# Patient Record
Sex: Female | Born: 1970 | Race: Black or African American | Hispanic: No | Marital: Married | Smoking: Never smoker
Health system: Southern US, Community
[De-identification: ages and names within clinical notes are randomized; demographics above are authoritative.]

## PROBLEM LIST (undated history)

## (undated) DIAGNOSIS — Z9289 Personal history of other medical treatment: Secondary | ICD-10-CM

## (undated) DIAGNOSIS — E669 Obesity, unspecified: Secondary | ICD-10-CM

## (undated) DIAGNOSIS — Z973 Presence of spectacles and contact lenses: Secondary | ICD-10-CM

## (undated) DIAGNOSIS — J209 Acute bronchitis, unspecified: Secondary | ICD-10-CM

## (undated) DIAGNOSIS — F419 Anxiety disorder, unspecified: Secondary | ICD-10-CM

## (undated) DIAGNOSIS — G43109 Migraine with aura, not intractable, without status migrainosus: Secondary | ICD-10-CM

## (undated) DIAGNOSIS — R0609 Other forms of dyspnea: Secondary | ICD-10-CM

## (undated) DIAGNOSIS — N946 Dysmenorrhea, unspecified: Secondary | ICD-10-CM

## (undated) DIAGNOSIS — M546 Pain in thoracic spine: Secondary | ICD-10-CM

## (undated) DIAGNOSIS — T7840XA Allergy, unspecified, initial encounter: Secondary | ICD-10-CM

## (undated) DIAGNOSIS — M542 Cervicalgia: Secondary | ICD-10-CM

## (undated) DIAGNOSIS — F411 Generalized anxiety disorder: Secondary | ICD-10-CM

## (undated) DIAGNOSIS — I5189 Other ill-defined heart diseases: Secondary | ICD-10-CM

## (undated) DIAGNOSIS — I2510 Atherosclerotic heart disease of native coronary artery without angina pectoris: Secondary | ICD-10-CM

## (undated) DIAGNOSIS — Z460 Encounter for fitting and adjustment of spectacles and contact lenses: Secondary | ICD-10-CM

## (undated) DIAGNOSIS — S83241A Other tear of medial meniscus, current injury, right knee, initial encounter: Secondary | ICD-10-CM

## (undated) DIAGNOSIS — J029 Acute pharyngitis, unspecified: Secondary | ICD-10-CM

## (undated) DIAGNOSIS — S83242A Other tear of medial meniscus, current injury, left knee, initial encounter: Secondary | ICD-10-CM

## (undated) DIAGNOSIS — L709 Acne, unspecified: Secondary | ICD-10-CM

## (undated) DIAGNOSIS — I10 Essential (primary) hypertension: Secondary | ICD-10-CM

## (undated) DIAGNOSIS — D500 Iron deficiency anemia secondary to blood loss (chronic): Secondary | ICD-10-CM

## (undated) DIAGNOSIS — T783XXA Angioneurotic edema, initial encounter: Secondary | ICD-10-CM

## (undated) DIAGNOSIS — Z9889 Other specified postprocedural states: Secondary | ICD-10-CM

## (undated) DIAGNOSIS — M948X9 Other specified disorders of cartilage, unspecified sites: Secondary | ICD-10-CM

## (undated) DIAGNOSIS — K219 Gastro-esophageal reflux disease without esophagitis: Secondary | ICD-10-CM

## (undated) HISTORY — DX: Other specified postprocedural states: Z98.890

## (undated) HISTORY — DX: Obesity, unspecified: E66.9

## (undated) HISTORY — DX: Acute pharyngitis, unspecified: J02.9

## (undated) HISTORY — DX: Other forms of dyspnea: R06.09

## (undated) HISTORY — DX: Personal history of other medical treatment: Z92.89

## (undated) HISTORY — DX: Migraine with aura, not intractable, without status migrainosus: G43.109

## (undated) HISTORY — DX: Essential (primary) hypertension: I10

## (undated) HISTORY — DX: Pain in thoracic spine: M54.6

## (undated) HISTORY — DX: Acne, unspecified: L70.9

## (undated) HISTORY — DX: Acute bronchitis, unspecified: J20.9

## (undated) HISTORY — DX: Other ill-defined heart diseases: I51.89

## (undated) HISTORY — DX: Generalized anxiety disorder: F41.1

## (undated) HISTORY — DX: Anxiety disorder, unspecified: F41.9

## (undated) HISTORY — DX: Other tear of medial meniscus, current injury, left knee, initial encounter: S83.242A

## (undated) HISTORY — DX: Angioneurotic edema, initial encounter: T78.3XXA

## (undated) HISTORY — DX: Dysmenorrhea, unspecified: N94.6

## (undated) HISTORY — DX: Gastro-esophageal reflux disease without esophagitis: K21.9

## (undated) HISTORY — DX: Other specified disorders of cartilage, unspecified sites: M94.8X9

## (undated) HISTORY — DX: Other tear of medial meniscus, current injury, right knee, initial encounter: S83.241A

## (undated) HISTORY — DX: Cervicalgia: M54.2

## (undated) HISTORY — DX: Atherosclerotic heart disease of native coronary artery without angina pectoris: I25.10

## (undated) HISTORY — DX: Allergy, unspecified, initial encounter: T78.40XA

## (undated) HISTORY — DX: Iron deficiency anemia secondary to blood loss (chronic): D50.0

## (undated) HISTORY — PX: DILATION AND CURETTAGE OF UTERUS: SHX78

## (undated) SURGERY — IMAGING PROCEDURE, GI TRACT, INTRALUMINAL, VIA CAPSULE
Anesthesia: Choice

---

## 2005-01-04 ENCOUNTER — Emergency Department: Payer: Self-pay | Admitting: Unknown Physician Specialty

## 2005-11-11 ENCOUNTER — Emergency Department: Payer: Self-pay | Admitting: Emergency Medicine

## 2006-05-13 ENCOUNTER — Emergency Department: Payer: Self-pay | Admitting: Emergency Medicine

## 2006-05-13 ENCOUNTER — Other Ambulatory Visit: Payer: Self-pay

## 2007-02-11 ENCOUNTER — Ambulatory Visit: Payer: Self-pay | Admitting: Family Medicine

## 2007-10-06 ENCOUNTER — Ambulatory Visit: Payer: Self-pay | Admitting: Family Medicine

## 2007-10-07 ENCOUNTER — Ambulatory Visit: Payer: Self-pay | Admitting: Family Medicine

## 2007-10-07 ENCOUNTER — Emergency Department: Payer: Self-pay | Admitting: Unknown Physician Specialty

## 2007-10-07 ENCOUNTER — Other Ambulatory Visit: Payer: Self-pay

## 2007-12-29 ENCOUNTER — Ambulatory Visit: Payer: Self-pay

## 2007-12-30 ENCOUNTER — Ambulatory Visit: Payer: Self-pay

## 2008-01-16 ENCOUNTER — Other Ambulatory Visit: Payer: Self-pay

## 2008-01-16 ENCOUNTER — Emergency Department: Payer: Self-pay | Admitting: Emergency Medicine

## 2008-10-15 ENCOUNTER — Ambulatory Visit: Payer: Self-pay

## 2008-10-21 ENCOUNTER — Inpatient Hospital Stay: Payer: Self-pay

## 2009-09-20 ENCOUNTER — Ambulatory Visit: Payer: Self-pay | Admitting: Family Medicine

## 2009-10-29 ENCOUNTER — Ambulatory Visit: Payer: Self-pay | Admitting: Family Medicine

## 2010-04-25 HISTORY — PX: ABDOMINAL HYSTERECTOMY: SHX81

## 2010-04-25 HISTORY — PX: OVARIAN CYST REMOVAL: SHX89

## 2010-05-25 ENCOUNTER — Ambulatory Visit: Payer: Self-pay

## 2010-08-06 ENCOUNTER — Ambulatory Visit: Payer: Self-pay | Admitting: Internal Medicine

## 2010-12-18 ENCOUNTER — Ambulatory Visit: Payer: Self-pay | Admitting: Internal Medicine

## 2011-03-26 ENCOUNTER — Ambulatory Visit: Payer: Self-pay

## 2011-03-30 LAB — CBC AND DIFFERENTIAL: WBC: 6.6 10^3/mL

## 2011-05-20 ENCOUNTER — Ambulatory Visit: Payer: Self-pay | Admitting: Family Medicine

## 2011-08-30 ENCOUNTER — Encounter: Payer: Self-pay | Admitting: Internal Medicine

## 2011-08-30 ENCOUNTER — Ambulatory Visit (INDEPENDENT_AMBULATORY_CARE_PROVIDER_SITE_OTHER): Payer: PRIVATE HEALTH INSURANCE | Admitting: Internal Medicine

## 2011-08-30 VITALS — BP 136/90 | HR 95 | Temp 98.1°F | Ht 62.0 in | Wt 216.0 lb

## 2011-08-30 DIAGNOSIS — G4700 Insomnia, unspecified: Secondary | ICD-10-CM

## 2011-08-30 DIAGNOSIS — E669 Obesity, unspecified: Secondary | ICD-10-CM

## 2011-08-30 MED ORDER — PHENTERMINE HCL 37.5 MG PO CAPS: 37.5000 mg | ORAL_CAPSULE | ORAL | Status: DC

## 2011-08-30 MED ORDER — ALPRAZOLAM 0.5 MG PO TABS: 0.5000 mg | ORAL_TABLET | Freq: Two times a day (BID) | ORAL | Status: DC | PRN

## 2011-08-30 NOTE — Assessment & Plan Note (Signed)
BMI 30. Patient has significantly reduced caloric intake and has adopted an exercise program with no improvement in her weight. Will try adding phentermine to help suppress appetite. Patient has a history of hypertension, and we discussed the need for close monitoring of her blood pressure with use of this medication. 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 She will call if blood pressure greater than 140/90. She will also call if any other symptoms such as palpitations or headache. Goal will be for one to 2 pounds of weight loss per week. She will followup in one month.

## 2011-08-30 NOTE — Progress Notes (Signed)
Subjective:    Patient ID: Marie Vaughan, female    DOB: 1970-09-28, 41 y.o.   MRN: [id number]  HPI 41 year old female with history of obesity and insomnia presents to establish care. In general, she reports that she's been feeling well. She notes that over the last several months she has been trying to lose weight. She notes that she is adopted a healthy diet, with limited processed carbohydrates and fat. She notes that she has increased her water intake. She has also been exercising 30 minutes to one hour daily by walking. She has not seen any improvement in her weight. She notes that she has been evaluated for this in the past and had testing including testing for hypothyroidism. She reports that workup has been negative.  She also notes a history of anxiety and insomnia. She reports that she has been taking Xanax occasionally at bedtime to help with anxiety and sleep. She reports significant improvement in her symptoms on this medication. She denies any morning drowsiness or other side effects.  Outpatient Encounter Prescriptions as of 08/30/2011  Medication Sig Dispense Refill  . ALPRAZolam (XANAX) 0.5 MG tablet Take 1 tablet (0.5 mg total) by mouth 2 (two) times daily as needed.  60 tablet  3  . esomeprazole (NEXIUM) 40 MG capsule Take 40 mg by mouth daily.      Marland Kitchen HYDROcodone-acetaminophen (VICODIN) 5-500 MG per tablet Take 1 tablet by mouth every 4 (four) hours as needed.      . phentermine 37.5 MG capsule Take 1 capsule (37.5 mg total) by mouth every morning.  30 capsule  0  . DISCONTD: ALPRAZolam (XANAX) 0.5 MG tablet Take 0.5 mg by mouth 2 (two) times daily as needed.        Review of Systems  Constitutional: Negative for fever, chills, appetite change, fatigue and unexpected weight change.  HENT: Negative for ear pain, congestion, sore throat, trouble swallowing, neck pain, voice change and sinus pressure.   Eyes: Negative for visual disturbance.  Respiratory: Negative for cough,  shortness of breath, wheezing and stridor.   Cardiovascular: Negative for chest pain, palpitations and leg swelling.  Gastrointestinal: Negative for nausea, vomiting, abdominal pain, diarrhea, constipation, blood in stool, abdominal distention and anal bleeding.  Genitourinary: Negative for dysuria and flank pain.  Musculoskeletal: Negative for myalgias, arthralgias and gait problem.  Skin: Negative for color change and rash.  Neurological: Negative for dizziness and headaches.  Hematological: Negative for adenopathy. Does not bruise/bleed easily.  Psychiatric/Behavioral: Positive for sleep disturbance. Negative for suicidal ideas and dysphoric mood. The patient is not nervous/anxious.    BP 136/90  Pulse 95  Temp(Src) 98.1 F (36.7 C) (Oral)  Ht 5\' 2"  (1.575 m)  Wt 216 lb (97.977 kg)  BMI 39.51 kg/m2  SpO2 99%     Objective:   Physical Exam  Constitutional: She is oriented to person, place, and time. She appears well-developed and well-nourished. No distress.  HENT:  Head: Normocephalic and atraumatic.  Right Ear: External ear normal.  Left Ear: External ear normal.  Nose: Nose normal.  Mouth/Throat: Oropharynx is clear and moist. No oropharyngeal exudate.  Eyes: Conjunctivae are normal. Pupils are equal, round, and reactive to light. Right eye exhibits no discharge. Left eye exhibits no discharge. No scleral icterus.  Neck: Normal range of motion. Neck supple. No tracheal deviation present. No thyromegaly present.  Cardiovascular: Normal rate, regular rhythm, normal heart sounds and intact distal pulses.  Exam reveals no gallop and no friction rub.  No murmur heard. Pulmonary/Chest: Effort normal and breath sounds normal. No respiratory distress. She has no wheezes. She has no rales. She exhibits no tenderness.  Musculoskeletal: Normal range of motion. She exhibits no edema and no tenderness.  Lymphadenopathy:    She has no cervical adenopathy.  Neurological: She is alert and  oriented to person, place, and time. No cranial nerve deficit. She exhibits normal muscle tone. Coordination normal.  Skin: Skin is warm and dry. No rash noted. She is not diaphoretic. No erythema. No pallor.  Psychiatric: She has a normal mood and affect. Her behavior is normal. Judgment and thought content normal.          Assessment & Plan:

## 2011-08-30 NOTE — Assessment & Plan Note (Signed)
Improved with the use of Xanax. Will refill medication today.

## 2011-09-14 ENCOUNTER — Encounter: Payer: Self-pay | Admitting: Internal Medicine

## 2011-09-28 ENCOUNTER — Telehealth: Payer: Self-pay | Admitting: Internal Medicine

## 2011-09-28 ENCOUNTER — Ambulatory Visit: Payer: PRIVATE HEALTH INSURANCE | Admitting: Internal Medicine

## 2011-09-28 NOTE — Telephone Encounter (Signed)
[phone] Pt r/s appointment from today to next Friday 3/15  She only has enough pills to last till wed 3/13 Phentermine 37.5 mg tablet [facility]

## 2011-09-28 NOTE — Telephone Encounter (Signed)
Informed pt that she would need OV prior to next fill to ensure that BP was not elevated. Pt understands and will keep OV next week.

## 2011-10-05 ENCOUNTER — Encounter: Payer: Self-pay | Admitting: Internal Medicine

## 2011-10-05 ENCOUNTER — Ambulatory Visit (INDEPENDENT_AMBULATORY_CARE_PROVIDER_SITE_OTHER): Payer: PRIVATE HEALTH INSURANCE | Admitting: Internal Medicine

## 2011-10-05 ENCOUNTER — Telehealth: Payer: Self-pay | Admitting: *Deleted

## 2011-10-05 VITALS — BP 128/80 | HR 97 | Temp 98.3°F | Ht 62.0 in | Wt 200.0 lb

## 2011-10-05 DIAGNOSIS — E669 Obesity, unspecified: Secondary | ICD-10-CM

## 2011-10-05 DIAGNOSIS — J309 Allergic rhinitis, unspecified: Secondary | ICD-10-CM | POA: Insufficient documentation

## 2011-10-05 MED ORDER — PHENTERMINE HCL 37.5 MG PO CAPS: 37.5000 mg | ORAL_CAPSULE | ORAL | Status: DC

## 2011-10-05 MED ORDER — FLUTICASONE PROPIONATE 50 MCG/ACT NA SUSP: 2.0000 | Freq: Every day | NASAL | Status: DC

## 2011-10-05 NOTE — Progress Notes (Signed)
Subjective:    Patient ID: Marie Vaughan, female    DOB: 06-25-1971, 40 y.o.   MRN: [id number]  HPI 41 year old female with history of obesity presents for followup. She reports that she's been doing well. She has been using phentermine to help with appetite suppression and reports significant improvement with this. She has been limiting her intake of sugar and has been exercising regularly. She denies any noted side effects from phentermine.  She notes some recent increase clear nasal drainage. She denies any fever, chills, cough, sore throat, or other symptoms. She questions whether this may be related to allergies. She has not tried any over-the-counter medications for this.  Outpatient Encounter Prescriptions as of 10/05/2011  Medication Sig Dispense Refill  . ALPRAZolam (XANAX) 0.5 MG tablet Take 1 tablet (0.5 mg total) by mouth 2 (two) times daily as needed.  60 tablet  3  . esomeprazole (NEXIUM) 40 MG capsule Take 40 mg by mouth daily.      Marland Kitchen HYDROcodone-acetaminophen (VICODIN) 5-500 MG per tablet Take 1 tablet by mouth every 4 (four) hours as needed.      . fluticasone (FLONASE) 50 MCG/ACT nasal spray Place 2 sprays into the nose daily.  1 g  0  . phentermine 37.5 MG capsule Take 1 capsule (37.5 mg total) by mouth every morning.  30 capsule  0    Review of Systems  Constitutional: Negative for fever, chills, appetite change, fatigue and unexpected weight change.  HENT: Positive for rhinorrhea, sneezing and postnasal drip. Negative for ear pain, congestion, sore throat, trouble swallowing, neck pain, voice change and sinus pressure.   Eyes: Negative for visual disturbance.  Respiratory: Negative for cough, shortness of breath, wheezing and stridor.   Cardiovascular: Negative for chest pain, palpitations and leg swelling.  Gastrointestinal: Negative for nausea, vomiting, abdominal pain, diarrhea, constipation, blood in stool, abdominal distention and anal bleeding.  Genitourinary:  Negative for dysuria and flank pain.  Musculoskeletal: Negative for myalgias, arthralgias and gait problem.  Skin: Negative for color change and rash.  Neurological: Negative for dizziness and headaches.  Hematological: Negative for adenopathy. Does not bruise/bleed easily.  Psychiatric/Behavioral: Negative for suicidal ideas, sleep disturbance and dysphoric mood. The patient is not nervous/anxious.    BP 128/80  Pulse 97  Temp(Src) 98.3 F (36.8 C) (Oral)  Ht 5\' 2"  (1.575 m)  Wt 200 lb (90.719 kg)  BMI 36.58 kg/m2  SpO2 100%     Objective:   Physical Exam  Constitutional: She is oriented to person, place, and time. She appears well-developed and well-nourished. No distress.  HENT:  Head: Normocephalic and atraumatic.  Right Ear: External ear normal.  Left Ear: External ear normal.  Nose: Mucosal edema present.  Mouth/Throat: Oropharynx is clear and moist. No oropharyngeal exudate.  Eyes: Conjunctivae are normal. Pupils are equal, round, and reactive to light. Right eye exhibits no discharge. Left eye exhibits no discharge. No scleral icterus.  Neck: Normal range of motion. Neck supple. No tracheal deviation present. No thyromegaly present.  Cardiovascular: Normal rate, regular rhythm, normal heart sounds and intact distal pulses.  Exam reveals no gallop and no friction rub.   No murmur heard. Pulmonary/Chest: Effort normal and breath sounds normal. No respiratory distress. She has no wheezes. She has no rales. She exhibits no tenderness.  Musculoskeletal: Normal range of motion. She exhibits no edema and no tenderness.  Lymphadenopathy:    She has no cervical adenopathy.  Neurological: She is alert and oriented to person, place, and time.  No cranial nerve deficit. She exhibits normal muscle tone. Coordination normal.  Skin: Skin is warm and dry. No rash noted. She is not diaphoretic. No erythema. No pallor.  Psychiatric: She has a normal mood and affect. Her behavior is normal.  Judgment and thought content normal.          Assessment & Plan:

## 2011-10-05 NOTE — Telephone Encounter (Signed)
Tried calling patient on home/cell number and was unable to reach her, no way to leave a message.

## 2011-10-05 NOTE — Assessment & Plan Note (Signed)
Educated patient on over 10 pound weight loss since her last visit. Encouraged her to continue efforts at diet and exercise. Will continue phentermine for appetite suppression. Followup one month.

## 2011-10-05 NOTE — Assessment & Plan Note (Signed)
Symptoms consistent with allergic rhinitis. Will add nasal steroid and encouraged her to use Claritin or Zyrtec over-the-counter to help her symptoms. She will call if no improvement.

## 2011-10-05 NOTE — Telephone Encounter (Signed)
My Chart message sent

## 2011-10-05 NOTE — Telephone Encounter (Signed)
What is the hydrocodone for? She did not mention this at her visit?

## 2011-10-05 NOTE — Telephone Encounter (Signed)
Patient requesting RF of hydrocodone 5/500 1 q 4 to 6 prn #30

## 2011-10-25 ENCOUNTER — Encounter: Payer: Self-pay | Admitting: Internal Medicine

## 2011-10-25 DIAGNOSIS — K219 Gastro-esophageal reflux disease without esophagitis: Secondary | ICD-10-CM | POA: Insufficient documentation

## 2011-10-25 DIAGNOSIS — G43109 Migraine with aura, not intractable, without status migrainosus: Secondary | ICD-10-CM | POA: Insufficient documentation

## 2011-10-25 DIAGNOSIS — F419 Anxiety disorder, unspecified: Secondary | ICD-10-CM | POA: Insufficient documentation

## 2011-10-25 DIAGNOSIS — T783XXA Angioneurotic edema, initial encounter: Secondary | ICD-10-CM | POA: Insufficient documentation

## 2011-10-25 DIAGNOSIS — L709 Acne, unspecified: Secondary | ICD-10-CM | POA: Insufficient documentation

## 2011-11-15 ENCOUNTER — Ambulatory Visit: Payer: PRIVATE HEALTH INSURANCE | Admitting: Internal Medicine

## 2011-11-15 DIAGNOSIS — Z0289 Encounter for other administrative examinations: Secondary | ICD-10-CM

## 2012-01-23 ENCOUNTER — Ambulatory Visit: Payer: Self-pay | Admitting: Family Medicine

## 2012-04-18 ENCOUNTER — Ambulatory Visit: Payer: Self-pay | Admitting: Orthopedic Surgery

## 2012-08-10 ENCOUNTER — Emergency Department: Payer: Self-pay | Admitting: Emergency Medicine

## 2012-08-10 LAB — BASIC METABOLIC PANEL
Anion Gap: 11 (ref 7–16)
BUN: 9 mg/dL (ref 7–18)
Chloride: 110 mmol/L — ABNORMAL HIGH (ref 98–107)
EGFR (African American): >60
EGFR (Non-African Amer.): >60
Glucose: 155 mg/dL — ABNORMAL HIGH (ref 65–99)
Osmolality: 283 (ref 275–301)
Potassium: 3.8 mmol/L (ref 3.5–5.1)
Sodium: 141 mmol/L (ref 136–145)

## 2012-08-10 LAB — CBC
HGB: 12.3 g/dL (ref 12.0–16.0)
MCHC: 32.8 g/dL (ref 32.0–36.0)
RBC: 4.49 10*6/uL (ref 3.80–5.20)
RDW: 14.3 % (ref 11.5–14.5)
WBC: 7.3 10*3/uL (ref 3.6–11.0)

## 2012-08-10 LAB — PROTIME-INR: Prothrombin Time: 13.3 secs (ref 11.5–14.7)

## 2012-09-06 ENCOUNTER — Other Ambulatory Visit: Payer: Self-pay

## 2012-12-17 ENCOUNTER — Ambulatory Visit: Payer: Self-pay | Admitting: Family Medicine

## 2013-01-05 ENCOUNTER — Encounter: Payer: Self-pay | Admitting: Internal Medicine

## 2013-01-05 ENCOUNTER — Telehealth: Payer: Self-pay | Admitting: Internal Medicine

## 2013-01-05 ENCOUNTER — Ambulatory Visit (INDEPENDENT_AMBULATORY_CARE_PROVIDER_SITE_OTHER): Payer: 59 | Admitting: Internal Medicine

## 2013-01-05 VITALS — BP 124/80 | HR 88 | Temp 98.7°F | Ht 62.0 in | Wt 218.0 lb

## 2013-01-05 DIAGNOSIS — R05 Cough: Secondary | ICD-10-CM | POA: Insufficient documentation

## 2013-01-05 DIAGNOSIS — R059 Cough, unspecified: Secondary | ICD-10-CM | POA: Insufficient documentation

## 2013-01-05 MED ORDER — FAMOTIDINE 20 MG PO TABS: ORAL_TABLET | ORAL | Status: DC

## 2013-01-05 MED ORDER — TRAMADOL HCL 50 MG PO TABS: ORAL_TABLET | ORAL | Status: DC

## 2013-01-05 MED ORDER — ESOMEPRAZOLE MAGNESIUM 40 MG PO CPDR: 40.0000 mg | DELAYED_RELEASE_CAPSULE | Freq: Every day | ORAL | Status: DC

## 2013-01-05 MED ORDER — PREDNISONE (PAK) 10 MG PO TABS: ORAL_TABLET | ORAL | Status: DC

## 2013-01-05 NOTE — Patient Instructions (Addendum)
The key to effective treatment for your cough is eliminating the non-stop cycle of cough you're stuck in long enough to let your airway heal completely and then see if there is anything still making you cough once you stop the cough suppression, but this should take no more than 5 days to figure out  First take delsym two tsp every 12 hours and supplement if needed with  tramadol 50 mg up to 2 every 4 hours to suppress the urge to cough at all or even clear your throat. Swallowing water or using ice chips/non mint and menthol containing candies (such as lifesavers or sugarless jolly ranchers) are also effective.  You should rest your voice and avoid activities that you know make you cough.  Once you have eliminated the cough for 3 straight days try reducing the tramadol first,  then the delsym as tolerated.    Try prilosec 20mg   Take 30-60 min before first meal of the day and Pepcid 20 mg one bedtime until cough is completely gone for at least a week without the need for cough suppression  I think of reflux for chronic cough like I do oxygen for fire (doesn't cause the fire but once you get the oxygen suppressed it usually goes away regardless of the exact cause).  GERD (REFLUX)  is an extremely common cause of respiratory symptoms, many times with no significant heartburn at all.    It can be treated with medication, but also with lifestyle changes including avoidance of late meals, excessive alcohol, smoking cessation, and avoid fatty foods, chocolate, peppermint, colas, red wine, and acidic juices such as orange juice.  NO MINT OR MENTHOL PRODUCTS SO NO COUGH DROPS  USE SUGARLESS CANDY INSTEAD (jolley ranchers or Stover's)  NO OIL BASED VITAMINS - use powdered substitutes.    Prednisone 10 mg take  4 each am x 2 days,   2 each am x 2 days,  1 each am x 2 days and stop    Chlortrimeton 4 mg one at bedtime  If not 100% better you need to return in 2 weeks

## 2013-01-05 NOTE — Progress Notes (Signed)
  Subjective:    Patient ID: Marie Vaughan, female    DOB: 1970-11-12 MRN: [id number]  HPI  71 yobf never smoker referred to [facility] around 2011 for hives rec avoidance (cock roach, peanuts, pollen) and benadryl >  Better, and then acute uri symptoms early May 2014 rx with zpak then septra but cough persisted so referred 01/05/2013 to pulmonary clinic in [place] by Dr Dennison Mascot.  01/05/2013 1st pulmonary eval cc mostly dry cough daily since abupt onset with ?cold in early May to point of choking and vomiting abrupt onset with uri x 3 weeks assoc with central cp ant worse with coughing and mainly sob with cough. Also urinary incont with cough.   Previously on prn nexium but not taking it regularly as doesn't perceive this is bad HB  No obvious daytime variabilty or assoc chronic cough or cp or chest tightness, subjective wheeze overt sinus or hb symptoms. No unusual exp hx or h/o childhood pna/ asthma or knowledge of premature birth.   Sleeping ok p cough suppression  without nocturnal  or early am exacerbation  of respiratory  c/o's or need for noct saba. Also denies any obvious fluctuation of symptoms with weather or environmental changes or other aggravating or alleviating factors except as outlined above     Review of Systems  Constitutional: Negative for fever, chills and unexpected weight change.  HENT: Positive for ear pain. Negative for nosebleeds, congestion, sore throat, rhinorrhea, sneezing, trouble swallowing, dental problem, voice change, postnasal drip and sinus pressure.   Eyes: Negative for visual disturbance.  Respiratory: Positive for cough, choking and shortness of breath.   Cardiovascular: Positive for chest pain. Negative for leg swelling.  Gastrointestinal: Positive for abdominal pain. Negative for vomiting, diarrhea and abdominal distention.  Genitourinary: Negative for difficulty urinating.  Musculoskeletal: Negative for arthralgias.  Skin: Negative for  rash.  Neurological: Positive for headaches. Negative for tremors and syncope.  Hematological: Does not bruise/bleed easily.       Objective:   Physical Exam amb hoarse bf nad  Wt Readings from Last 3 Encounters:  01/05/13 218 lb (98.884 kg)  03/30/11 216 lb 9.6 oz (98.249 kg)  10/05/11 200 lb (90.719 kg)     HEENT: nl dentition, turbinates, and orophanx. Nl external ear canals without cough reflex   NECK :  without JVD/Nodes/TM/ nl carotid upstrokes bilaterally   LUNGS: no acc muscle use, clear to A and P bilaterally without cough on insp or exp maneuvers   CV:  RRR  no s3 or murmur or increase in P2, no edema   ABD:  soft and nontender with nl excursion in the supine position. No bruits or organomegaly, bowel sounds nl  MS:  warm without deformities, calf tenderness, cyanosis or clubbing  SKIN: warm and dry without lesions    NEURO:  alert, approp, no deficits     cxr at [place] one week prior to ov      Assessment & Plan:

## 2013-01-05 NOTE — Assessment & Plan Note (Addendum)
The most common causes of chronic cough in immunocompetent adults include the following: upper airway cough syndrome (UACS), previously referred to as postnasal drip syndrome (PNDS), which is caused by variety of rhinosinus conditions; (2) asthma; (3) GERD; (4) chronic bronchitis from cigarette smoking or other inhaled environmental irritants; (5) nonasthmatic eosinophilic bronchitis; and (6) bronchiectasis.   These conditions, singly or in combination, have accounted for up to 94% of the causes of chronic cough in prospective studies.   Other conditions have constituted no >6% of the causes in prospective studies These have included bronchogenic carcinoma, chronic interstitial pneumonia, sarcoidosis, left ventricular failure, ACEI-induced cough, and aspiration from a condition associated with pharyngeal dysfunction.    Chronic cough is often simultaneously caused by more than one condition. A single cause has been found from 38 to 82% of the time, multiple causes from 18 to 62%. Multiply caused cough has been the result of three diseases up to 42% of the time.       Most likely this is  Classic Upper airway cough syndrome, so named because it's frequently impossible to sort out how much is  CR/sinusitis with freq throat clearing (which can be related to primary GERD)   vs  causing  secondary (" extra esophageal")  GERD from wide swings in gastric pressure that occur with throat clearing, often  promoting self use of mint and menthol lozenges that reduce the lower esophageal sphincter tone and exacerbate the problem further in a cyclical fashion.   These are the same pts (now being labeled as having "irritable larynx syndrome" by some cough centers) who not infrequently have a history of having failed to tolerate ace inhibitors,  dry powder inhalers or biphosphonates or report having atypical reflux symptoms that don't respond to standard doses of PPI , and are easily confused as having aecopd or asthma  flares by even experienced allergists/ pulmonologists.   rec max gerd rx and cyclical cough regimen plus H1 for ? pnds > See instructions for specific recommendations which were reviewed directly with the patient who was given a copy with highlighter outlining the key components.   NB The standardized cough guidelines published in Chest by [place] in 2006 are still the best available and consist of a multiple step process (up to 12!) , not a single office visit,  and are intended  to address this problem logically,  with an alogrithm dependent on response to empiric treatment at  each progressive step  to determine a specific diagnosis with  minimal addtional testing needed. Therefore if adherence is an issue or can't be accurately verified,  it's very unlikely the standard evaluation and treatment will be successful here.    Furthermore, response to therapy (other than acute cough suppression, which should only be used short term with avoidance of narcotic containing cough syrups if possible), can be a gradual process for which the patient may perceive immediate benefit.  Unlike going to an eye doctor where the best perscription is almost always the first one and is immediately effective, this is almost never the case in the management of chronic cough syndromes. Therefore the patient needs to commit up front to consistently adhere to recommendations  for up to 6 weeks of therapy directed at the likely underlying problem(s) before the response can be reasonably evaluated.

## 2013-01-06 ENCOUNTER — Encounter: Payer: Self-pay | Admitting: Internal Medicine

## 2013-01-06 ENCOUNTER — Telehealth: Payer: Self-pay | Admitting: Internal Medicine

## 2013-01-06 NOTE — Telephone Encounter (Signed)
Spoke with pt notified that I have faxed her work excuse Nothing further needed per pt

## 2013-01-06 NOTE — Telephone Encounter (Signed)
Needs work note stating patient out of work:  6/17-6/18 Return date: 6/19  Ok to send work note?  Please advise Dr Sherene Sires. Thanks.

## 2013-01-06 NOTE — Telephone Encounter (Signed)
Ok to excuse from work as outlined

## 2013-01-06 NOTE — Telephone Encounter (Signed)
I spoke to pt around 10am this morning and made her aware that I would check with Dr Sherene Sires and then letter would be sent. Message has been sent to MW to address.  Duplicate message--see msg 01/05/13

## 2013-01-13 ENCOUNTER — Institutional Professional Consult (permissible substitution): Payer: Self-pay | Admitting: Pulmonary Disease

## 2013-04-02 ENCOUNTER — Ambulatory Visit: Payer: Self-pay

## 2013-04-10 ENCOUNTER — Ambulatory Visit: Payer: Self-pay | Admitting: Family Medicine

## 2013-05-19 ENCOUNTER — Ambulatory Visit: Payer: Self-pay | Admitting: Orthopedic Surgery

## 2013-05-28 ENCOUNTER — Other Ambulatory Visit: Payer: Self-pay

## 2013-06-08 ENCOUNTER — Encounter (HOSPITAL_BASED_OUTPATIENT_CLINIC_OR_DEPARTMENT_OTHER): Payer: Self-pay | Admitting: *Deleted

## 2013-06-08 NOTE — Progress Notes (Signed)
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

## 2013-06-10 ENCOUNTER — Encounter (HOSPITAL_BASED_OUTPATIENT_CLINIC_OR_DEPARTMENT_OTHER)
Admission: RE | Disposition: A | Discharge status: Home / Self Care | Payer: Self-pay | Source: Ambulatory Visit | Attending: Orthopedic Surgery

## 2013-06-10 ENCOUNTER — Other Ambulatory Visit: Payer: Self-pay | Admitting: Physician Assistant

## 2013-06-10 ENCOUNTER — Ambulatory Visit (HOSPITAL_BASED_OUTPATIENT_CLINIC_OR_DEPARTMENT_OTHER)
Admission: RE | Admit: 2013-06-10 | Discharge: 2013-06-10 | Disposition: A | Discharge status: Home / Self Care | Payer: 59 | Source: Ambulatory Visit | Attending: Orthopedic Surgery | Admitting: Orthopedic Surgery

## 2013-06-10 ENCOUNTER — Ambulatory Visit (HOSPITAL_BASED_OUTPATIENT_CLINIC_OR_DEPARTMENT_OTHER): Payer: 59 | Admitting: *Deleted

## 2013-06-10 ENCOUNTER — Encounter (HOSPITAL_BASED_OUTPATIENT_CLINIC_OR_DEPARTMENT_OTHER): Payer: 59 | Admitting: *Deleted

## 2013-06-10 ENCOUNTER — Encounter (HOSPITAL_BASED_OUTPATIENT_CLINIC_OR_DEPARTMENT_OTHER): Payer: Self-pay | Admitting: *Deleted

## 2013-06-10 ENCOUNTER — Encounter: Payer: Self-pay | Admitting: Physician Assistant

## 2013-06-10 DIAGNOSIS — M659 Unspecified synovitis and tenosynovitis, unspecified site: Secondary | ICD-10-CM | POA: Insufficient documentation

## 2013-06-10 DIAGNOSIS — S83242A Other tear of medial meniscus, current injury, left knee, initial encounter: Secondary | ICD-10-CM | POA: Diagnosis present

## 2013-06-10 DIAGNOSIS — S83289A Other tear of lateral meniscus, current injury, unspecified knee, initial encounter: Secondary | ICD-10-CM | POA: Insufficient documentation

## 2013-06-10 DIAGNOSIS — M2240 Chondromalacia patellae, unspecified knee: Secondary | ICD-10-CM | POA: Insufficient documentation

## 2013-06-10 DIAGNOSIS — I10 Essential (primary) hypertension: Secondary | ICD-10-CM | POA: Insufficient documentation

## 2013-06-10 DIAGNOSIS — W19XXXA Unspecified fall, initial encounter: Secondary | ICD-10-CM | POA: Insufficient documentation

## 2013-06-10 DIAGNOSIS — G4700 Insomnia, unspecified: Secondary | ICD-10-CM | POA: Diagnosis present

## 2013-06-10 DIAGNOSIS — F419 Anxiety disorder, unspecified: Secondary | ICD-10-CM | POA: Diagnosis present

## 2013-06-10 DIAGNOSIS — IMO0002 Reserved for concepts with insufficient information to code with codable children: Secondary | ICD-10-CM | POA: Insufficient documentation

## 2013-06-10 DIAGNOSIS — S83241A Other tear of medial meniscus, current injury, right knee, initial encounter: Secondary | ICD-10-CM | POA: Diagnosis present

## 2013-06-10 DIAGNOSIS — E669 Obesity, unspecified: Secondary | ICD-10-CM | POA: Diagnosis present

## 2013-06-10 DIAGNOSIS — K219 Gastro-esophageal reflux disease without esophagitis: Secondary | ICD-10-CM | POA: Diagnosis present

## 2013-06-10 HISTORY — PX: KNEE ARTHROSCOPY WITH MEDIAL MENISECTOMY: SHX5651

## 2013-06-10 HISTORY — DX: Encounter for fitting and adjustment of spectacles and contact lenses: Z46.0

## 2013-06-10 HISTORY — PX: KNEE ARTHROSCOPY: SHX127

## 2013-06-10 LAB — POCT HEMOGLOBIN-HEMACUE: Hemoglobin: 12.6 g/dL (ref 12.0–15.0)

## 2013-06-10 SURGERY — ARTHROSCOPY, KNEE, WITH MEDIAL MENISCECTOMY
Anesthesia: General | Site: Knee | Laterality: Right

## 2013-06-10 MED ORDER — BUPIVACAINE-EPINEPHRINE 0.25% -1:200000 IJ SOLN
INTRAMUSCULAR | Status: DC | PRN
  Administered 2013-06-10: 20 mL

## 2013-06-10 MED ORDER — CEFAZOLIN SODIUM 1-5 GM-% IV SOLN
INTRAVENOUS | Status: AC
  Filled 2013-06-10: qty 100

## 2013-06-10 MED ORDER — EPINEPHRINE HCL 1 MG/ML IJ SOLN
INTRAMUSCULAR | Status: DC | PRN
  Administered 2013-06-10: 1 mg

## 2013-06-10 MED ORDER — ONDANSETRON HCL 4 MG/2ML IJ SOLN
INTRAMUSCULAR | Status: DC | PRN
  Administered 2013-06-10: 4 mg via INTRAVENOUS

## 2013-06-10 MED ORDER — PROPOFOL 10 MG/ML IV BOLUS
INTRAVENOUS | Status: DC | PRN
  Administered 2013-06-10: 150 mg via INTRAVENOUS

## 2013-06-10 MED ORDER — LACTATED RINGERS IV SOLN
INTRAVENOUS | Status: DC
  Administered 2013-06-10 (×2): via INTRAVENOUS

## 2013-06-10 MED ORDER — FENTANYL CITRATE 0.05 MG/ML IJ SOLN
INTRAMUSCULAR | Status: DC | PRN
  Administered 2013-06-10 (×2): 50 ug via INTRAVENOUS

## 2013-06-10 MED ORDER — HYDROMORPHONE HCL PF 1 MG/ML IJ SOLN: 0.2500 mg | INTRAMUSCULAR | Status: DC | PRN

## 2013-06-10 MED ORDER — MIDAZOLAM HCL 2 MG/2ML IJ SOLN
INTRAMUSCULAR | Status: AC
  Filled 2013-06-10: qty 2

## 2013-06-10 MED ORDER — FENTANYL CITRATE 0.05 MG/ML IJ SOLN
INTRAMUSCULAR | Status: AC
  Filled 2013-06-10: qty 6

## 2013-06-10 MED ORDER — DEXAMETHASONE SODIUM PHOSPHATE 10 MG/ML IJ SOLN
INTRAMUSCULAR | Status: DC | PRN
  Administered 2013-06-10: 10 mg via INTRAVENOUS

## 2013-06-10 MED ORDER — MIDAZOLAM HCL 2 MG/2ML IJ SOLN
1.0000 mg | INTRAMUSCULAR | Status: DC | PRN
  Administered 2013-06-10: 2 mg via INTRAVENOUS

## 2013-06-10 MED ORDER — HYDROCODONE-ACETAMINOPHEN 5-325 MG PO TABS: 1.0000 | ORAL_TABLET | Freq: Four times a day (QID) | ORAL | Status: DC | PRN

## 2013-06-10 MED ORDER — CEFAZOLIN SODIUM-DEXTROSE 2-3 GM-% IV SOLR
2.0000 g | INTRAVENOUS | Status: AC
  Administered 2013-06-10: 2 g via INTRAVENOUS

## 2013-06-10 MED ORDER — LIDOCAINE HCL (CARDIAC) 20 MG/ML IV SOLN
INTRAVENOUS | Status: DC | PRN
  Administered 2013-06-10: 100 mg via INTRAVENOUS

## 2013-06-10 MED ORDER — FENTANYL CITRATE 0.05 MG/ML IJ SOLN
INTRAMUSCULAR | Status: AC
  Filled 2013-06-10: qty 2

## 2013-06-10 MED ORDER — DIAZEPAM 2 MG PO TABS: 2.0000 mg | ORAL_TABLET | Freq: Three times a day (TID) | ORAL | Status: DC | PRN

## 2013-06-10 MED ORDER — MEPERIDINE HCL 25 MG/ML IJ SOLN: 6.2500 mg | INTRAMUSCULAR | Status: DC | PRN

## 2013-06-10 MED ORDER — SODIUM CHLORIDE 0.9 % IR SOLN
Status: DC | PRN
  Administered 2013-06-10: 6000 mL

## 2013-06-10 MED ORDER — MIDAZOLAM HCL 5 MG/5ML IJ SOLN
INTRAMUSCULAR | Status: DC | PRN
  Administered 2013-06-10: 1 mg via INTRAVENOUS

## 2013-06-10 MED ORDER — ONDANSETRON HCL 4 MG/2ML IJ SOLN: 4.0000 mg | Freq: Once | INTRAMUSCULAR | Status: DC | PRN

## 2013-06-10 MED ORDER — FENTANYL CITRATE 0.05 MG/ML IJ SOLN
50.0000 ug | INTRAMUSCULAR | Status: DC | PRN
  Administered 2013-06-10: 100 ug via INTRAVENOUS

## 2013-06-10 MED ORDER — OXYCODONE HCL 5 MG/5ML PO SOLN: 5.0000 mg | Freq: Once | ORAL | Status: AC | PRN

## 2013-06-10 MED ORDER — PROPOFOL 10 MG/ML IV EMUL
INTRAVENOUS | Status: AC
  Filled 2013-06-10: qty 50

## 2013-06-10 MED ORDER — EPINEPHRINE HCL 1 MG/ML IJ SOLN
INTRAMUSCULAR | Status: AC
  Filled 2013-06-10: qty 1

## 2013-06-10 MED ORDER — OXYCODONE HCL 5 MG PO TABS
5.0000 mg | ORAL_TABLET | Freq: Once | ORAL | Status: AC | PRN
  Administered 2013-06-10: 5 mg via ORAL

## 2013-06-10 MED ORDER — CHLORHEXIDINE GLUCONATE 4 % EX LIQD: 60.0000 mL | Freq: Once | CUTANEOUS | Status: DC

## 2013-06-10 MED ORDER — OXYCODONE HCL 5 MG PO TABS
ORAL_TABLET | ORAL | Status: AC
  Filled 2013-06-10: qty 1

## 2013-06-10 SURGICAL SUPPLY — 56 items
BANDAGE ELASTIC 6 VELCRO ST LF (GAUZE/BANDAGES/DRESSINGS) ×6 IMPLANT
BANDAGE ESMARK 6X9 LF (GAUZE/BANDAGES/DRESSINGS) IMPLANT
BENZOIN TINCTURE PRP APPL 2/3 (GAUZE/BANDAGES/DRESSINGS) IMPLANT
BLADE CUTTER GATOR 3.5 (BLADE) IMPLANT
BLADE GREAT WHITE 4.2 (BLADE) IMPLANT
BLADE SURG 15 STRL LF DISP TIS (BLADE) IMPLANT
BLADE SURG 15 STRL SS (BLADE)
BNDG COHESIVE 4X5 TAN STRL (GAUZE/BANDAGES/DRESSINGS) ×6 IMPLANT
BNDG ESMARK 6X9 LF (GAUZE/BANDAGES/DRESSINGS)
CANISTER SUCT 3000ML (MISCELLANEOUS) ×3 IMPLANT
CANISTER SUCT LVC 12 LTR MEDI- (MISCELLANEOUS) ×3 IMPLANT
DRAPE ARTHROSCOPY W/POUCH 114 (DRAPES) ×3 IMPLANT
DRAPE ARTHROSCOPY W/POUCH 90 (DRAPES) ×6 IMPLANT
DRAPE U 20/CS (DRAPES) ×3 IMPLANT
DURAPREP 26ML APPLICATOR (WOUND CARE) ×6 IMPLANT
GAUZE XEROFORM 1X8 LF (GAUZE/BANDAGES/DRESSINGS) ×3 IMPLANT
GLOVE BIO SURGEON STRL SZ 6.5 (GLOVE) ×3 IMPLANT
GLOVE BIO SURGEON STRL SZ7 (GLOVE) ×3 IMPLANT
GLOVE BIOGEL PI IND STRL 7.0 (GLOVE) ×4 IMPLANT
GLOVE BIOGEL PI IND STRL 7.5 (GLOVE) ×2 IMPLANT
GLOVE BIOGEL PI INDICATOR 7.0 (GLOVE) ×2
GLOVE BIOGEL PI INDICATOR 7.5 (GLOVE) ×1
GLOVE SS BIOGEL STRL SZ 7.5 (GLOVE) ×2 IMPLANT
GLOVE SUPERSENSE BIOGEL SZ 7.5 (GLOVE) ×1
GOWN PREVENTION PLUS XLARGE (GOWN DISPOSABLE) ×6 IMPLANT
HOLDER KNEE FOAM BLUE (MISCELLANEOUS) ×6 IMPLANT
K-WIRE .062X4 (WIRE) IMPLANT
KNEE WRAP E Z 3 GEL PACK (MISCELLANEOUS) ×6 IMPLANT
NDL SAFETY ECLIPSE 18X1.5 (NEEDLE) ×4 IMPLANT
NEEDLE HYPO 18GX1.5 SHARP (NEEDLE) ×2
NEEDLE HYPO 22GX1.5 SAFETY (NEEDLE) IMPLANT
PACK ARTHROSCOPY DSU (CUSTOM PROCEDURE TRAY) ×3 IMPLANT
PACK BASIN DAY SURGERY FS (CUSTOM PROCEDURE TRAY) ×3 IMPLANT
PAD ALCOHOL SWAB (MISCELLANEOUS) ×18 IMPLANT
SET ARTHROSCOPY TUBING (MISCELLANEOUS) ×1
SET ARTHROSCOPY TUBING LN (MISCELLANEOUS) ×2 IMPLANT
SPONGE GAUZE 4X4 12PLY (GAUZE/BANDAGES/DRESSINGS) ×3 IMPLANT
STOCKINETTE 4X48 STRL (DRAPES) ×3 IMPLANT
STRIP CLOSURE SKIN 1/2X4 (GAUZE/BANDAGES/DRESSINGS) IMPLANT
SUCTION FRAZIER TIP 10 FR DISP (SUCTIONS) IMPLANT
SUT ETHILON 4 0 PS 2 18 (SUTURE) ×3 IMPLANT
SUT FIBERWIRE #2 38 T-5 BLUE (SUTURE)
SUT PDS AB 0 CT 36 (SUTURE) IMPLANT
SUT PROLENE 3 0 PS 2 (SUTURE) IMPLANT
SUT VIC AB 0 CT1 18XCR BRD 8 (SUTURE) IMPLANT
SUT VIC AB 0 CT1 8-18 (SUTURE)
SUT VIC AB 2-0 CT1 27 (SUTURE)
SUT VIC AB 2-0 CT1 TAPERPNT 27 (SUTURE) IMPLANT
SUT VIC AB 3-0 PS1 18 (SUTURE)
SUT VIC AB 3-0 PS1 18XBRD (SUTURE) IMPLANT
SUTURE FIBERWR #2 38 T-5 BLUE (SUTURE) IMPLANT
SYR 20CC LL (SYRINGE) ×3 IMPLANT
SYR 5ML LL (SYRINGE) ×3 IMPLANT
TOWEL OR 17X24 6PK STRL BLUE (TOWEL DISPOSABLE) ×3 IMPLANT
WAND STAR VAC 90 (SURGICAL WAND) ×3 IMPLANT
WATER STERILE IRR 1000ML POUR (IV SOLUTION) ×3 IMPLANT

## 2013-06-10 NOTE — H&P (View-Only) (Signed)
Marie Vaughan is an 42 y.o. female.   Chief Complaint: bilateral knee pain HPI: 42 yowf injured both knees in a fall in August 2014.  She has an MRI of her right knee that shows a medial meniscus tear and her left knee is having similar symptoms.  She has failed medication and intra-articular injections.    Past Medical History  Diagnosis Date  . Vaginal delivery     x 3  . Angio-edema   . HTN (hypertension)   . GERD (gastroesophageal reflux disease)   . Migraine     w/ aura w/o intract mgn   . Acne   . Anxiety   . Contact lens/glasses fitting     wears contacts or glasses  . Tear of medial meniscus of left knee   . Tear of medial meniscus of right knee     Past Surgical History  Procedure Laterality Date  . Ovarian cyst removal  04-25-10  . Abdominal hysterectomy  04-25-10    Due to abnormal PAP    Family History  Problem Relation Age of Onset  . Early death Mother     Murdered when pt was 64 years old  . Diabetes Mother   . Hypertension Mother   . Gout Mother   . Kidney failure Mother   . Early death Sister 52    due to hemorage  . Alcohol abuse Maternal Uncle   . Alcohol abuse Other   . Arthritis Other   . Heart disease Other   . Other Other     cousin- phlebitis  . Diabetes Maternal Aunt   . Hyperlipidemia Maternal Aunt   . Hypertension Maternal Aunt   . Prostate cancer Maternal Uncle    Social History:  reports that she has never smoked. She has never used smokeless tobacco. She reports that she drinks alcohol. She reports that she does not use illicit drugs.  Allergies:  Allergies  Allergen Reactions  . Iohexol     CP and SOB  . Peanut-Containing Drug Products Swelling    cashews   Current Outpatient Prescriptions on File Prior to Visit  Medication Sig Dispense Refill  . esomeprazole (NEXIUM) 40 MG capsule Take 1 capsule (40 mg total) by mouth daily before breakfast.      . olmesartan (BENICAR) 40 MG tablet Take 40 mg by mouth daily.       No current  facility-administered medications on file prior to visit.    (Not in a hospital admission)  No results found for this or any previous visit (from the past 48 hour(s)). No results found.  Review of Systems  Constitutional: Negative.   HENT: Negative.   Eyes: Negative.   Respiratory: Negative.   Cardiovascular: Negative.   Gastrointestinal: Negative.   Genitourinary: Negative.   Musculoskeletal: Positive for joint pain.       Bilateral knee  Skin: Negative.   Neurological: Negative.   Endo/Heme/Allergies: Negative.   Psychiatric/Behavioral: The patient is nervous/anxious.     There were no vitals taken for this visit. Physical Exam  Constitutional: She is oriented to person, place, and time. She appears well-developed and well-nourished.  HENT:  Head: Normocephalic and atraumatic.  Eyes: Conjunctivae are normal. Pupils are equal, round, and reactive to light.  Neck: Neck supple.  Cardiovascular: Normal rate.   Respiratory: Effort normal.  GI: Soft.  Genitourinary:  Not pertinent to current symptomatology therefore not examined.  Musculoskeletal:  Examination of both knee shows from 2+ crepitus, 2+ synovitis, positive  medial mcmurrays. They are ligamentously stable and have normal patella tracking. 2+ dorsal pedis pulses  Neurological: She is alert and oriented to person, place, and time.  Skin: Skin is warm and dry.  Psychiatric: She has a normal mood and affect.     Assessment Patient Active Problem List   Diagnosis Date Noted  . Tear of medial meniscus of left knee   . Tear of medial meniscus of right knee   . Cough 01/05/2013  . Angio-edema   . HTN (hypertension)   . GERD (gastroesophageal reflux disease)   . Migraine   . Acne   . Anxiety   . Allergic rhinitis 10/05/2011  . Insomnia 08/30/2011  . Obesity 08/30/2011    Plan Dr Thurston Hole has discussed the risks benefits and possible complications of bilateral knee arthroscopy with possible medial  menisectomies   The patient understands and is without question.  Nickalus Thornsberry J 06/10/2013, 9:45 AM

## 2013-06-10 NOTE — Anesthesia Procedure Notes (Addendum)
Procedure Name: LMA Insertion Date/Time: 06/10/2013 11:24 AM Performed by: Salvatore Marvel A Pre-anesthesia Checklist: Patient identified, Emergency Drugs available, Suction available and Patient being monitored Patient Re-evaluated:Patient Re-evaluated prior to inductionOxygen Delivery Method: Circle System Utilized Preoxygenation: Pre-oxygenation with 100% oxygen Intubation Type: IV induction Ventilation: Mask ventilation without difficulty LMA: LMA inserted LMA Size: 4.0 Number of attempts: 1 Airway Equipment and Method: bite block Placement Confirmation: positive ETCO2 and breath sounds checked- equal and bilateral Tube secured with: Tape Dental Injury: Teeth and Oropharynx as per pre-operative assessment     Bilateral Knee Blocks by Arta Bruce MD  Time out performed. Informed consent given. Patient sedated. Left and Right knee prepped. Sterile towels placed. Two inferior portals injected in both knees. Intraarticular injection performed bilaterally. Pt tolerated well Times: 11:10 - 11:20 Meds 15 cc 0.5% marcaine with Epi, 15cc 1.5% lidocaine with Epi in each Knee  Arta Bruce MD

## 2013-06-10 NOTE — Progress Notes (Signed)
Assisted Dr. Michelle Piper with right, left, knee block. Side rails up, monitors on throughout procedure. See vital signs in flow sheet. Tolerated Procedure well.

## 2013-06-10 NOTE — Anesthesia Preprocedure Evaluation (Signed)
Anesthesia Evaluation  Patient identified by MRN, date of birth, ID band Patient awake    Reviewed: Allergy & Precautions, H&P , NPO status , Patient's Chart, lab work & pertinent test results  Airway Mallampati: I TM Distance: >3 FB Neck ROM: Full    Dental   Pulmonary          Cardiovascular hypertension, Pt. on medications     Neuro/Psych    GI/Hepatic GERD-  Controlled and Medicated,  Endo/Other    Renal/GU      Musculoskeletal   Abdominal   Peds  Hematology   Anesthesia Other Findings   Reproductive/Obstetrics                           Anesthesia Physical Anesthesia Plan  ASA: II  Anesthesia Plan: General   Post-op Pain Management:    Induction: Intravenous  Airway Management Planned: LMA  Additional Equipment:   Intra-op Plan:   Post-operative Plan: Extubation in OR  Informed Consent: I have reviewed the patients History and Physical, chart, labs and discussed the procedure including the risks, benefits and alternatives for the proposed anesthesia with the patient or authorized representative who has indicated his/her understanding and acceptance.     Plan Discussed with: CRNA and Surgeon  Anesthesia Plan Comments:         Anesthesia Quick Evaluation

## 2013-06-10 NOTE — Transfer of Care (Signed)
Immediate Anesthesia Transfer of Care Note  Patient: Marie Vaughan  Procedure(s) Performed: Procedure(s) with comments: RIGHT KNEE ARTHROSCOPY WITH MEDIAL MENISECTOMY (Right) - partial lateral menisectomoy and chondroplasty LEFT KNEE ARTHROSCOPY KNEE WITH PARTIAL MEDIAL MENISCECTOMY  (Left) - xerofoam, 4x4's, webril, ace wrap, ice wrap  Patient Location: PACU  Anesthesia Type:General  Level of Consciousness: awake, alert  and oriented  Airway & Oxygen Therapy: Patient Spontanous Breathing and Patient connected to face mask oxygen  Post-op Assessment: Report given to PACU RN, Post -op Vital signs reviewed and stable and Patient moving all extremities  Post vital signs: Reviewed and stable  Complications: No apparent anesthesia complications

## 2013-06-10 NOTE — H&P (Signed)
Marie Vaughan is an 42 y.o. female.   Chief Complaint: bilateral knee pain HPI: 42 yowf injured both knees in a fall in August 2014.  She has an MRI of her right knee that shows a medial meniscus tear and her left knee is having similar symptoms.  She has failed medication and intra-articular injections.    Past Medical History  Diagnosis Date  . Vaginal delivery     x 3  . Angio-edema   . HTN (hypertension)   . GERD (gastroesophageal reflux disease)   . Migraine     w/ aura w/o intract mgn   . Acne   . Anxiety   . Contact lens/glasses fitting     wears contacts or glasses  . Tear of medial meniscus of left knee   . Tear of medial meniscus of right knee     Past Surgical History  Procedure Laterality Date  . Ovarian cyst removal  04-25-10  . Abdominal hysterectomy  04-25-10    Due to abnormal PAP    Family History  Problem Relation Age of Onset  . Early death Mother     Murdered when pt was 6 years old  . Diabetes Mother   . Hypertension Mother   . Gout Mother   . Kidney failure Mother   . Early death Sister 22    due to hemorage  . Alcohol abuse Maternal Uncle   . Alcohol abuse Other   . Arthritis Other   . Heart disease Other   . Other Other     cousin- phlebitis  . Diabetes Maternal Aunt   . Hyperlipidemia Maternal Aunt   . Hypertension Maternal Aunt   . Prostate cancer Maternal Uncle    Social History:  reports that she has never smoked. She has never used smokeless tobacco. She reports that she drinks alcohol. She reports that she does not use illicit drugs.  Allergies:  Allergies  Allergen Reactions  . Iohexol     CP and SOB  . Peanut-Containing Drug Products Swelling    cashews   Current Outpatient Prescriptions on File Prior to Visit  Medication Sig Dispense Refill  . esomeprazole (NEXIUM) 40 MG capsule Take 1 capsule (40 mg total) by mouth daily before breakfast.      . olmesartan (BENICAR) 40 MG tablet Take 40 mg by mouth daily.       No current  facility-administered medications on file prior to visit.    (Not in a hospital admission)  No results found for this or any previous visit (from the past 48 hour(s)). No results found.  Review of Systems  Constitutional: Negative.   HENT: Negative.   Eyes: Negative.   Respiratory: Negative.   Cardiovascular: Negative.   Gastrointestinal: Negative.   Genitourinary: Negative.   Musculoskeletal: Positive for joint pain.       Bilateral knee  Skin: Negative.   Neurological: Negative.   Endo/Heme/Allergies: Negative.   Psychiatric/Behavioral: The patient is nervous/anxious.     There were no vitals taken for this visit. Physical Exam  Constitutional: She is oriented to person, place, and time. She appears well-developed and well-nourished.  HENT:  Head: Normocephalic and atraumatic.  Eyes: Conjunctivae are normal. Pupils are equal, round, and reactive to light.  Neck: Neck supple.  Cardiovascular: Normal rate.   Respiratory: Effort normal.  GI: Soft.  Genitourinary:  Not pertinent to current symptomatology therefore not examined.  Musculoskeletal:  Examination of both knee shows from 2+ crepitus, 2+ synovitis, positive   medial mcmurrays. They are ligamentously stable and have normal patella tracking. 2+ dorsal pedis pulses  Neurological: She is alert and oriented to person, place, and time.  Skin: Skin is warm and dry.  Psychiatric: She has a normal mood and affect.     Assessment Patient Active Problem List   Diagnosis Date Noted  . Tear of medial meniscus of left knee   . Tear of medial meniscus of right knee   . Cough 01/05/2013  . Angio-edema   . HTN (hypertension)   . GERD (gastroesophageal reflux disease)   . Migraine   . Acne   . Anxiety   . Allergic rhinitis 10/05/2011  . Insomnia 08/30/2011  . Obesity 08/30/2011    Plan Dr Wainer has discussed the risks benefits and possible complications of bilateral knee arthroscopy with possible medial  menisectomies   The patient understands and is without question.  Marie Vaughan 06/10/2013, 9:45 AM    

## 2013-06-10 NOTE — Anesthesia Postprocedure Evaluation (Signed)
Anesthesia Post Note  Patient: Marie Vaughan  Procedure(s) Performed: Procedure(s) (LRB): RIGHT KNEE ARTHROSCOPY WITH MEDIAL MENISECTOMY (Right) LEFT KNEE ARTHROSCOPY KNEE WITH PARTIAL MEDIAL MENISCECTOMY  (Left)  Anesthesia type: general  Patient location: PACU  Post pain: Pain level controlled  Post assessment: Patient's Cardiovascular Status Stable  Last Vitals:  Filed Vitals:   06/10/13 1355  BP: 143/91  Pulse: 84  Temp: 36.4 C  Resp: 16    Post vital signs: Reviewed and stable  Level of consciousness: sedated  Complications: No apparent anesthesia complications

## 2013-06-10 NOTE — Interval H&P Note (Signed)
History and Physical Interval Note:  06/10/2013 11:14 AM  Marie Vaughan  has presented today for surgery, with the diagnosis of RIGHT MEDIAL MENISCUS TEAR  The various methods of treatment have been discussed with the patient and family. After consideration of risks, benefits and other options for treatment, the patient has consented to  Procedure(s): RIGHT KNEE ARTHROSCOPY WITH MEDIAL MENISECTOMY (Right) LEFT KNEE ARTHROSCOPY KNEE WITH PARTIAL MEDIAL MENISCECTOMY  (Left) as a surgical intervention .  The patient's history has been reviewed, patient examined, no change in status, stable for surgery.  I have reviewed the patient's chart and labs.  Questions were answered to the patient's satisfaction.     Salvatore Marvel A

## 2013-06-11 ENCOUNTER — Encounter (HOSPITAL_BASED_OUTPATIENT_CLINIC_OR_DEPARTMENT_OTHER): Payer: Self-pay | Admitting: Orthopedic Surgery

## 2013-06-11 NOTE — Op Note (Deleted)
NAME:  Marie Vaughan, Marie Vaughan                  ACCOUNT NO.:  [id number]  MEDICAL RECORD NO.:  [id number]  LOCATION:                                FACILITY:  [facility]  PHYSICIAN:  Ulysse Siemen A. Kasper Mudrick, M.D. DATE OF BIRTH:  03/05/1971  DATE OF PROCEDURE:  06/10/2013 DATE OF DISCHARGE:  06/10/2013                              OPERATIVE REPORT   PREOPERATIVE DIAGNOSES: 1. 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 2. Right knee lateral meniscus tear with patellofemoral chondromalacia     and synovitis.  POSTOPERATIVE DIAGNOSES: 1. 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 2. Right knee lateral meniscus tear with patellofemoral chondromalacia     and synovitis.  PROCEDURE: 1. Left knee examination under anesthesia followed by arthroscopic     partial lateral meniscectomy. 2. Left knee chondroplasty with partial synovectomy. 3. Right knee examination under anesthesia followed by arthroscopic     partial lateral meniscectomy. 4. Right knee chondroplasty with partial synovectomy.  SURGEON:  Demar Shad A. Preslei Blakley, M.D.  ASSISTANT:  Kirstin Shepperson, PA-C.  ANESTHESIA:  General.  OPERATIVE TIME:  45 minutes.  COMPLICATIONS:  None.  INDICATION FOR PROCEDURE:  Marie Vaughan is a 42-year-old woman who injured her knees in a fall on August 2014.  She has had significant pain in both knees with exam and MRI documenting meniscal tearing with chondromalacia.  She has failed conservative care and is now to undergo arthroscopy.  DESCRIPTION:  Marie Vaughan was brought in to the operating room on 06/10/2013, after knee block was placed in the holding room by Anesthesia.  She was placed on the operative table in supine position. She received antibiotics preoperatively for prophylaxis.  After being placed under general anesthesia, both knees were examined.  She had full range of motion.  Knees were stable.  Ligamentous exam with  normal patellar tracking.  Both legs were prepped using sterile DuraPrep and draped using sterile technique.  Time-out procedure was called and both knees were determined to be the correct knees.  Initially, the left knee was arthroscoped.  Through an anterolateral portal, the arthroscope with a pump attached was placed into an anteromedial portal, an arthroscopic probe was placed.  On initial inspection medial compartment showed 20% grade 3 chondromalacia, which was debrided.  Medial meniscus was intact. Intercondylar notch inspected.  Anterior posterior cruciate ligaments were normal.  Lateral compartment inspected.  The articular cartilage showed 25% grade 3 chondromalacia, which was debrided.  Lateral meniscus showed a tear of the anterior horn 20%, which was resected back to a stable rim.  The rest of the lateral meniscus was intact.  Popliteus tendon was intact.  Patellofemoral joint showed 30% grade 3 chondromalacia on the patella, which was debrided.  Femoral groove articular cartilage was intact.  The patella tracked normally.  Moderate synovitis, medial and lateral gutters were debrided, otherwise they are free of pathology.  At this point, it was felt that all pathology in the left knee had been satisfactorily addressed.  The instruments were removed.  Portals closed with 3-0 nylon suture.  At this point, the right knee arthroscopy   was addressed through an anterolateral portal, the arthroscope with a pump attached was placed into an anteromedial portal, an arthroscopic probe was placed.  On initial inspection of the medial compartment, the articular cartilage was intact.  Medial meniscus was intact.  Intercondylar notch was inspected.  Anterior and posterior cruciate ligaments were normal. Lateral compartment inspected.  The anterior horn of lateral meniscus showed partial tearing 25-30%, which was debrided.  The rest of the lateral meniscus was intact.  Lateral compartment  articular cartilage showed 30% grade 3 chondromalacia, which was debrided.  Popliteus tendon was intact.  Patellofemoral joint showed 25% grade 3 chondromalacia on the patella, which was debrided.  Femoral groove articular cartilage was intact.  The patella tracked normally.  Moderate synovitis, medial and lateral gutters were debrided, otherwise, this was free of pathology. After this was done, it was felt that all pathology had been satisfactorily addressed.  The instruments were removed.  Portals closed with 3-0 nylon suture.  Sterile dressings were applied.  The patient awakened and taken to recovery room in stable condition.  FOLLOWUP CARE:  Marie Vaughan is to be followed as an outpatient on Norco for pain.  She will be back in the office in a week for sutures out and followup.     Elizar Alpern A. Elysse Polidore, M.D.     RAW/MEDQ  D:  06/10/2013  T:  06/11/2013  Job:  [id number] 

## 2013-06-11 NOTE — Op Note (Signed)
NAMEELLANA, KAWA                  ACCOUNT NO.:  [id number]  MEDICAL RECORD NO.:  [id number]  LOCATION:                                FACILITY:  [facility]  PHYSICIAN:  Amar Sippel A. Thurston Hole, M.D. DATE OF BIRTH:  [birth year]  DATE OF PROCEDURE:  06/10/2013 DATE OF DISCHARGE:  06/10/2013                              OPERATIVE REPORT   PREOPERATIVE DIAGNOSES: 1. 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 2. Right knee lateral meniscus tear with patellofemoral chondromalacia     and synovitis.  POSTOPERATIVE DIAGNOSES: 1. 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 2. Right knee lateral meniscus tear with patellofemoral chondromalacia     and synovitis.  PROCEDURE: 1. Left knee examination under anesthesia followed by arthroscopic     partial lateral meniscectomy. 2. Left knee chondroplasty with partial synovectomy. 3. Right knee examination under anesthesia followed by arthroscopic     partial lateral meniscectomy. 4. Right knee chondroplasty with partial synovectomy.  SURGEON:  Elana Alm. Thurston Hole, M.D.  ASSISTANT:  Kirstin Shepperson, PA-C.  ANESTHESIA:  General.  OPERATIVE TIME:  45 minutes.  COMPLICATIONS:  None.  INDICATION FOR PROCEDURE:  Ms. Lenig is a 42 year old woman who injured her knees in a fall on August 2014.  She has had significant pain in both knees with exam and MRI documenting meniscal tearing with chondromalacia.  She has failed conservative care and is now to undergo arthroscopy.  DESCRIPTION:  Ms. Mcgann was brought in to the operating room on 06/10/2013, after knee block was placed in the holding room by Anesthesia.  She was placed on the operative table in supine position. She received antibiotics preoperatively for prophylaxis.  After being placed under general anesthesia, both knees were examined.  She had full range of motion.  Knees were stable.  Ligamentous exam with  normal patellar tracking.  Both legs were prepped using sterile DuraPrep and draped using sterile technique.  Time-out procedure was called and both knees were determined to be the correct knees.  Initially, the left knee was arthroscoped.  Through an anterolateral portal, the arthroscope with a pump attached was placed into an anteromedial portal, an arthroscopic probe was placed.  On initial inspection medial compartment showed 20% grade 3 chondromalacia, which was debrided.  Medial meniscus was intact. Intercondylar notch inspected.  Anterior posterior cruciate ligaments were normal.  Lateral compartment inspected.  The articular cartilage showed 25% grade 3 chondromalacia, which was debrided.  Lateral meniscus showed a tear of the anterior horn 20%, which was resected back to a stable rim.  The rest of the lateral meniscus was intact.  Popliteus tendon was intact.  Patellofemoral joint showed 30% grade 3 chondromalacia on the patella, which was debrided.  Femoral groove articular cartilage was intact.  The patella tracked normally.  Moderate synovitis, medial and lateral gutters were debrided, otherwise they are free of pathology.  At this point, it was felt that all pathology in the left knee had been satisfactorily addressed.  The instruments were removed.  Portals closed with 3-0 nylon suture.  At this point, the right knee arthroscopy  was addressed through an anterolateral portal, the arthroscope with a pump attached was placed into an anteromedial portal, an arthroscopic probe was placed.  On initial inspection of the medial compartment, the articular cartilage was intact.  Medial meniscus was intact.  Intercondylar notch was inspected.  Anterior and posterior cruciate ligaments were normal. Lateral compartment inspected.  The anterior horn of lateral meniscus showed partial tearing 25-30%, which was debrided.  The rest of the lateral meniscus was intact.  Lateral compartment  articular cartilage showed 30% grade 3 chondromalacia, which was debrided.  Popliteus tendon was intact.  Patellofemoral joint showed 25% grade 3 chondromalacia on the patella, which was debrided.  Femoral groove articular cartilage was intact.  The patella tracked normally.  Moderate synovitis, medial and lateral gutters were debrided, otherwise, this was free of pathology. After this was done, it was felt that all pathology had been satisfactorily addressed.  The instruments were removed.  Portals closed with 3-0 nylon suture.  Sterile dressings were applied.  The patient awakened and taken to recovery room in stable condition.  FOLLOWUP CARE:  Ms. Dern is to be followed as an outpatient on Norco for pain.  She will be back in the office in a week for sutures out and followup.     Om Lizotte A. Thurston Hole, M.D.     RAW/MEDQ  D:  06/10/2013  T:  06/11/2013  Job:  [id number]

## 2013-08-20 ENCOUNTER — Encounter: Payer: Self-pay | Admitting: *Deleted

## 2013-08-21 ENCOUNTER — Ambulatory Visit (INDEPENDENT_AMBULATORY_CARE_PROVIDER_SITE_OTHER): Payer: 59 | Admitting: Cardiovascular Disease

## 2013-08-21 ENCOUNTER — Encounter: Payer: Self-pay | Admitting: Cardiovascular Disease

## 2013-08-21 VITALS — BP 120/88 | HR 91 | Ht 62.0 in | Wt 217.5 lb

## 2013-08-21 DIAGNOSIS — I10 Essential (primary) hypertension: Secondary | ICD-10-CM

## 2013-08-21 DIAGNOSIS — R079 Chest pain, unspecified: Secondary | ICD-10-CM

## 2013-08-21 DIAGNOSIS — R0789 Other chest pain: Secondary | ICD-10-CM

## 2013-08-21 NOTE — Patient Instructions (Addendum)
[place]  Your caregiver has ordered a Stress Test with nuclear imaging. The purpose of this test is to evaluate the blood supply to your heart muscle. This procedure is referred to as a "Non-Invasive Stress Test." This is because other than having an IV started in your vein, nothing is inserted or "invades" your body. Cardiac stress tests are done to find areas of poor blood flow to the heart by determining the extent of coronary artery disease (CAD). Some patients exercise on a treadmill, which naturally increases the blood flow to your heart, while others who are  unable to walk on a treadmill due to physical limitations have a pharmacologic/chemical stress agent called Lexiscan . This medicine will mimic walking on a treadmill by temporarily increasing your coronary blood flow.   Please note: these test may take anywhere between 2-4 hours to complete  PLEASE REPORT TO [facility] AT THE FIRST DESK WILL DIRECT YOU WHERE TO GO  Date of Procedure:___________02/03/2015__________________________  Arrival Time for Procedure:________0715 am______________________  Instructions regarding medication:    PLEASE NOTIFY THE OFFICE AT LEAST 24 HOURS IN ADVANCE IF YOU ARE UNABLE TO KEEP YOUR APPOINTMENT.  [phone] AND  PLEASE NOTIFY NUCLEAR MEDICINE AT [facility] AT LEAST 24 HOURS IN ADVANCE IF YOU ARE UNABLE TO KEEP YOUR APPOINTMENT. [phone]  How to prepare for your Myoview test:  1. Do not eat or drink after midnight 2. No caffeine for 24 hours prior to test 3. No smoking 24 hours prior to test. 4. Your medication may be taken with water.  If your doctor stopped a medication because of this test, do not take that medication. 5. Ladies, please do not wear dresses.  Skirts or pants are appropriate. Please wear a short sleeve shirt. 6. No perfume, cologne or lotion. 7. Wear comfortable walking shoes. No heels!   Your physician recommends that you schedule a  follow-up appointment in:  Follow up needed

## 2013-08-22 ENCOUNTER — Encounter: Payer: Self-pay | Admitting: Cardiovascular Disease

## 2013-08-22 DIAGNOSIS — R0789 Other chest pain: Secondary | ICD-10-CM | POA: Insufficient documentation

## 2013-08-22 DIAGNOSIS — I10 Essential (primary) hypertension: Secondary | ICD-10-CM | POA: Insufficient documentation

## 2013-08-22 NOTE — Progress Notes (Signed)
Primary care physician: Dr. Rutherford Nail  HPI  This is a pleasant 43 year old female who was referred for evaluation of chest pain. 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 She has no previous cardiac history. She reports prolonged history of hypertension which has been reasonably controlled. She has strong family history of hypertension but not coronary artery disease. Other medical problems include gastroesophageal reflux disease and obesity. She also underwent recent bilateral knee surgery in November of 2015. She is not a smoker. About 2 weeks ago, she had an episode of sharp substernal chest discomfort radiating to the left side. This lasted for about 2 minutes and was severe. It happened at rest and resolved without intervention. Since that time, she reports no further episodes. She does complain of exertional dyspnea which has been chronic with occasional dizziness.  Allergies  Allergen Reactions  . Iohexol     CP and SOB  . Peanut-Containing Drug Products Swelling    cashews     Current Outpatient Prescriptions on File Prior to Visit  Medication Sig Dispense Refill  . ALPRAZolam (XANAX) 0.5 MG tablet Take 0.5 mg by mouth 2 (two) times daily.      Marland Kitchen esomeprazole (NEXIUM) 40 MG capsule Take 1 capsule (40 mg total) by mouth daily before breakfast.       No current facility-administered medications on file prior to visit.     Past Medical History  Diagnosis Date  . Vaginal delivery     x 3  . Angio-edema   . HTN (hypertension)   . GERD (gastroesophageal reflux disease)   . Migraine     w/ aura w/o intract mgn   . Acne   . Anxiety   . Contact lens/glasses fitting     wears contacts or glasses  . Tear of medial meniscus of left knee   . Tear of medial meniscus of right knee   . Iron deficiency anemia secondary to blood loss (chronic)   . Migraine with aura, without mention of intractable migraine without mention of status migrainosus   . Other acne   .  Acute bronchitis   . Pain in thoracic spine   . Pharyngitis   . Dysmenorrhea   . Anxiety state, unspecified   . 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 Cervicalgia      Past Surgical History  Procedure Laterality Date  . Ovarian cyst removal  04-25-10  . Abdominal hysterectomy  04-25-10    Due to abnormal PAP  . Knee arthroscopy with medial menisectomy Right 06/10/2013    Procedure: RIGHT KNEE ARTHROSCOPY WITH MEDIAL MENISECTOMY;  Surgeon: Lorn Junes, MD;  Location: [facility];  Service: Orthopedics;  Laterality: Right;  partial lateral menisectomoy and chondroplasty  . Knee arthroscopy Left 06/10/2013    Procedure: LEFT KNEE ARTHROSCOPY KNEE WITH PARTIAL MEDIAL MENISCECTOMY ;  Surgeon: Lorn Junes, MD;  Location: [facility];  Service: Orthopedics;  Laterality: Left;  xerofoam, 4x4's, webril, ace wrap, ice wrap  . Dilation and curettage of uterus       Family History  Problem Relation Age of Onset  . Early death Mother     Murdered when pt was 74 years old  . Diabetes Mother   . Hypertension Mother   . Gout Mother   . Kidney failure Mother   . Early death Sister 42    due to hemorage  . Alcohol abuse Maternal Uncle   . Alcohol abuse Other   .  Arthritis Other   . Heart disease Other   . Other Other     cousin- phlebitis  . Diabetes Maternal Aunt   . Hyperlipidemia Maternal Aunt   . Hypertension Maternal Aunt   . Prostate cancer Maternal Uncle      History   Social History  . Marital Status: Single    Spouse Name: N/A    Number of Children: 3  . Years of Education: N/A   Occupational History  . 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 Smoking status: Never Smoker   . Smokeless tobacco: Never Used  . Alcohol Use: No     Comment: Rarely  . Drug Use: No  . Sexual Activity: Not on file   Other Topics Concern  . Not on file   Social History Narrative   Lives in [place] with  fiance, and 2 children 67 and 16. Works at [employer].      Regular Exercise -  Walk, 30 - 1 hr daily   Daily Caffeine Use:  1 cup coffee AM (in cold weather)              ROS A 10 point review of system was performed. It is negative other than that mentioned in the history of present illness.   PHYSICAL EXAM   BP 120/88  Pulse 91  Ht 5\' 2"  (1.575 m)  Wt 217 lb 8 oz (98.657 kg)  BMI 39.77 kg/m2 Constitutional: She is oriented to person, place, and time. She appears well-developed and well-nourished. No distress.  HENT: No nasal discharge.  Head: Normocephalic and atraumatic.  Eyes: Pupils are equal and round. No discharge.  Neck: Normal range of motion. Neck supple. No JVD present. No thyromegaly present.  Cardiovascular: Normal rate, regular rhythm, normal heart sounds. Exam reveals no gallop and no friction rub. No murmur heard.  Pulmonary/Chest: Effort normal and breath sounds normal. No stridor. No respiratory distress. She has no wheezes. She has no rales. She exhibits no tenderness.  Abdominal: Soft. Bowel sounds are normal. She exhibits no distension. There is no tenderness. There is no rebound and no guarding.  Musculoskeletal: Normal range of motion. She exhibits no edema and no tenderness.  Neurological: She is alert and oriented to person, place, and time. Coordination normal.  Skin: Skin is warm and dry. No rash noted. She is not diaphoretic. No erythema. No pallor.  Psychiatric: She has a normal mood and affect. Her behavior is normal. Judgment and thought content normal.     EKG: Normal sinus rhythm with T wave changes suggestive of inferior and anterolateral ischemia.   ASSESSMENT AND PLAN

## 2013-08-22 NOTE — Assessment & Plan Note (Signed)
The quality of her chest pain is overall atypical and could be due to esophageal spasm or musculoskeletal. However, she has significant exertional dyspnea. Baseline ECG is abnormal with T wave changes in the inferior and anterolateral leads suggestive of ischemia. These EKG changes could be due to prolonged history of hypertension. However, underlying ischemia will need to be excluded. Thus, I recommend further evaluation with a pharmacologic nuclear stress test. She is not able to exercise on a treadmill due to her recent bilateral knee surgery.

## 2013-08-22 NOTE — Assessment & Plan Note (Signed)
Blood pressure is well controlled on current medications. 

## 2013-08-24 ENCOUNTER — Encounter: Payer: Self-pay | Admitting: *Deleted

## 2013-08-25 ENCOUNTER — Ambulatory Visit: Payer: Self-pay | Admitting: Cardiovascular Disease

## 2013-08-25 ENCOUNTER — Other Ambulatory Visit: Payer: Self-pay

## 2013-08-25 ENCOUNTER — Telehealth: Payer: Self-pay | Admitting: *Deleted

## 2013-08-25 DIAGNOSIS — R079 Chest pain, unspecified: Secondary | ICD-10-CM

## 2013-08-25 NOTE — Telephone Encounter (Signed)
Message copied by Tracie Harrier on Tue Aug 25, 2013  4:38 PM ------      Message from: Kathlyn Sacramento A      Created: Tue Aug 25, 2013  2:58 PM       Inform patient that  stress test was normal. ------

## 2013-08-25 NOTE — Telephone Encounter (Signed)
Patient would like Dr. Fletcher Anon to call her. She understands her stress test is normal but would like an explanation of why her EKG is abnormal if she has no cardiac issues.

## 2013-08-26 ENCOUNTER — Ambulatory Visit: Payer: 59 | Admitting: Cardiovascular Disease

## 2013-08-26 NOTE — Telephone Encounter (Signed)
Informed patient that per Dr. Fletcher Anon  "Baseline ECG is abnormal with T wave changes in the inferior and anterolateral leads suggestive of ischemia. These EKG changes could be due to prolonged history of hypertension". Patient verbalized understanding and will follow up with her prime doc to investigate non cardiac reasons for chest pain.

## 2013-08-26 NOTE — Telephone Encounter (Signed)
It is mentioned in my office note :  "Baseline ECG is abnormal with T wave changes in the inferior and anterolateral leads suggestive of ischemia. These EKG changes could be due to prolonged history of hypertension"

## 2013-08-26 NOTE — Telephone Encounter (Signed)
It is

## 2013-09-01 ENCOUNTER — Ambulatory Visit: Payer: Self-pay | Admitting: Family Medicine

## 2013-09-25 ENCOUNTER — Telehealth: Payer: Self-pay | Admitting: Emergency Medicine

## 2013-09-25 NOTE — Telephone Encounter (Signed)
Pt called wanting some weight loss meds, look as if this is what she has visited for in the past as well. Has not been seen on almost 2 years. I made her a 30 min apt however she wants to know if we can just fill the medication. Please advise.

## 2013-09-25 NOTE — Telephone Encounter (Signed)
No. Needs to be seen first.

## 2013-09-28 NOTE — Telephone Encounter (Signed)
Mailed unread message to pt  

## 2013-09-30 ENCOUNTER — Ambulatory Visit: Payer: Self-pay | Admitting: Family Medicine

## 2013-10-19 ENCOUNTER — Ambulatory Visit: Payer: 59 | Admitting: Internal Medicine

## 2013-10-27 ENCOUNTER — Ambulatory Visit (INDEPENDENT_AMBULATORY_CARE_PROVIDER_SITE_OTHER): Payer: 59 | Admitting: Pulmonary Disease

## 2013-10-27 ENCOUNTER — Encounter: Payer: Self-pay | Admitting: Pulmonary Disease

## 2013-10-27 VITALS — BP 128/84 | HR 88 | Ht 62.5 in | Wt 222.0 lb

## 2013-10-27 DIAGNOSIS — R0602 Shortness of breath: Secondary | ICD-10-CM

## 2013-10-27 DIAGNOSIS — R0789 Other chest pain: Secondary | ICD-10-CM

## 2013-10-27 NOTE — Patient Instructions (Signed)
We will arrange a pulmonary function test and echocardiogram and request records from Dr. Jefm Bryant We will see you back in 4-6 weeks or sooner if needed

## 2013-10-27 NOTE — Assessment & Plan Note (Addendum)
Today her physical exam was normal, her ambulatory oximetry was normal, and her March 2015 images were reviewed carefully in clinic and I saw no evidence of abnormality. So really don't see any clear evidence of a pulmonary problem. The only pulmonary issues that could cause chest pain and shortness of breath like this to be most concerned about would either be pleurisy which is unlikely considering that this is an intermittent problem and her lung pleura was normal. Or the other possibility is a pulmonary embolism. Because she had a negative D-dimer test we can safely assume that she is not having pulmonary emboli. Further, it would be exceedingly unlikely for someone to have recurrent pulmonary emboli without severe limitation in hemodynamics and her respiratory status.  So I think that it's much more likely that the chest pain is do to either acid reflux or costochondritis. Interestingly, today in clinic when she had an episode of chest pain she did have some tenderness at the sternal costal junction which is suggestive of costochondritis.  Plan: -Complete workup with an echocardiogram and pulmonary function testing -Recommended NSAIDs and warm compresses for costochondritis -Recommended she see a gastroenterologist.

## 2013-10-27 NOTE — Progress Notes (Signed)
Subjective:    Patient ID: Marie Vaughan, female    DOB: [birth year], 43 y.o.   MRN: [id number]  HPI  This is a very pleasant 43 year old female with a past medical history significant for gastroesophageal reflux disease who comes her clinic today for evaluation of chest pain and shortness of breath. She says that she's never been told she had respiratory problems in the past and she's never smoked cigarettes. In the last several months she's experienced severe chest pains which come on throughout the course of the day several times per day. The pain will feel sharp and is typically substernal and radiates to the left shoulder. It will come on abruptly while at rest at other times it'll happen when she is walking. It is not correlated with her diet. The pain will typically be so severe that it takes her breath away. This is happened numerous times in the last several months it happens at a minimum of 4-5 times per week. Sometimes it happens several times per day. In fact, she had one episode during our visit. She says that she has noticed intermittent shortness of breath with exertion ever since her knee surgery back in November 2014. This will typically occur when she is climbing a flight of stairs. She usually just pushes through the shortness of breath and doesn't stop. Exercise does not predictably bring on the chest pain. She had a stress test recently which was normal. She also had a D-dimer test which was normal and a CT scan of her chest which was normal. She saw a rheumatologist today for some joint swelling and apparently she is undergoing a serologic workup for that. She notes that she has acid reflux and she takes Prilosec for this on a daily basis. She's never seen a gastroenterologist.     Past Medical History  Diagnosis Date  . Vaginal delivery     x 3  . Angio-edema   . HTN (hypertension)   . GERD (gastroesophageal reflux disease)   . Migraine     w/ aura w/o intract mgn   . Acne    . Anxiety   . Contact lens/glasses fitting     wears contacts or glasses  . Tear of medial meniscus of left knee   . Tear of medial meniscus of right knee   . Iron deficiency anemia secondary to blood loss (chronic)   . Migraine with aura, without mention of intractable migraine without mention of status migrainosus   . Other acne   . Acute bronchitis   . Pain in thoracic spine   . Pharyngitis   . Dysmenorrhea   . Anxiety state, unspecified   . 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 Cervicalgia      Family History  Problem Relation Age of Onset  . Early death Mother     Murdered when pt was 57 years old  . Diabetes Mother   . Hypertension Mother   . Gout Mother   . Kidney failure Mother   . Early death Sister 22    due to hemorage  . Alcohol abuse Maternal Uncle   . Alcohol abuse Other   . Arthritis Other   . Heart disease Other   . Other Other     cousin- phlebitis  . Diabetes Maternal Aunt   . Hyperlipidemia Maternal Aunt   . Hypertension Maternal Aunt   . Prostate cancer Maternal Uncle      History   Social History  .  Marital Status: Single    Spouse Name: N/A    Number of Children: 3  . Years of Education: N/A   Occupational History  . Medical records - [employer]    Social History Main Topics  . Smoking status: Never Smoker   . Smokeless tobacco: Never Used  . Alcohol Use: No  . Drug Use: No  . Sexual Activity: Not on file   Other Topics Concern  . Not on file   Social History Narrative   Lives in [place] with fiance, and 2 children 2 and 16. Works at [employer].      Regular Exercise -  Walk, 30 - 1 hr daily   Daily Caffeine Use:  1 cup coffee AM (in cold weather)              Allergies  Allergen Reactions  . Chocolate     Hives, itching  . Iohexol     CP and SOB  . Peanut-Containing Drug Products Swelling    cashews     Outpatient Prescriptions Prior to Visit  Medication Sig Dispense Refill  . ALPRAZolam  (XANAX) 0.5 MG tablet Take 0.5 mg by mouth 2 (two) times daily as needed.       Marland Kitchen aspirin 325 MG tablet Take 0.5 mg by mouth daily.      Marland Kitchen esomeprazole (NEXIUM) 40 MG capsule Take 1 capsule (40 mg total) by mouth daily before breakfast.      . olmesartan-hydrochlorothiazide (BENICAR HCT) 20-12.5 MG per tablet 40 mg daily      . meloxicam (MOBIC) 15 MG tablet Take 15 mg by mouth 2 (two) times daily as needed for pain.       No facility-administered medications prior to visit.       Review of Systems  Constitutional: Negative for fever and unexpected weight change.  HENT: Negative for congestion, dental problem, ear pain, nosebleeds, postnasal drip, rhinorrhea, sinus pressure, sneezing, sore throat and trouble swallowing.   Eyes: Negative for redness and itching.  Respiratory: Positive for chest tightness, shortness of breath and wheezing. Negative for cough.   Cardiovascular: Positive for leg swelling. Negative for palpitations.  Gastrointestinal: Negative for nausea and vomiting.  Genitourinary: Negative for dysuria.  Musculoskeletal: Positive for arthralgias. Negative for joint swelling.  Skin: Negative for rash.  Neurological: Negative for headaches.  Hematological: Does not bruise/bleed easily.  Psychiatric/Behavioral: Negative for dysphoric mood. The patient is not nervous/anxious.        Objective:   Physical Exam Filed Vitals:   10/27/13 1612  BP: 128/84  Pulse: 88  Height: 5' 2.5" (1.588 m)  Weight: 222 lb (100.699 kg)  SpO2: 99%   Today in clinic she walked 500 feet on room air and her oxygen saturation stayed above 95%.  Gen: well appearing, no acute distress HEENT: NCAT, PERRL, EOMi, OP clear, neck supple without masses Chest: Mild tenderness left sternal costal junction PULM: CTA B CV: RRR, no mgr, no JVD AB: BS+, soft, nontender, no hsm Ext: warm, no edema, no clubbing, no cyanosis Derm: no rash or skin breakdown Neuro: A&Ox4, CN II-XII intact, strength 5/5  in all 4 extremities  March 2015 CT chest images reviewed> there is very slight scarring in the right middle lobe and otherwise the lung parenchyma is totally normal in appearance. There is no mediastinal lymphadenopathy or heart enlargement noted.     Assessment & Plan:   Atypical chest pain Today her physical exam was normal, her ambulatory oximetry was  normal, and her March 2015 images were reviewed carefully in clinic and I saw no evidence of abnormality. So really don't see any clear evidence of a pulmonary problem. The only pulmonary issues that could cause chest pain and shortness of breath like this to be most concerned about would either be pleurisy which is unlikely considering that this is an intermittent problem and her lung pleura was normal. Or the other possibility is a pulmonary embolism. Because she had a negative D-dimer test we can safely assume that she is not having pulmonary emboli. Further, it would be exceedingly unlikely for someone to have recurrent pulmonary emboli without severe limitation in hemodynamics and her respiratory status.  So I think that it's much more likely that the chest pain is do to either acid reflux or costochondritis. Interestingly, today in clinic when she had an episode of chest pain she did have some tenderness at the sternal costal junction which is suggestive of costochondritis.  Plan: -Complete workup with an echocardiogram and pulmonary function testing -Recommended NSAIDs and warm compresses for costochondritis -Recommended she see a gastroenterologist.    Updated Medication List Outpatient Encounter Prescriptions as of 10/27/2013  Medication Sig  . ALPRAZolam (XANAX) 0.5 MG tablet Take 0.5 mg by mouth 2 (two) times daily as needed.   Marland Kitchen aspirin 325 MG tablet Take 0.5 mg by mouth daily.  Marland Kitchen esomeprazole (NEXIUM) 40 MG capsule Take 1 capsule (40 mg total) by mouth daily before breakfast.  . olmesartan-hydrochlorothiazide (BENICAR HCT)  20-12.5 MG per tablet 40 mg daily  . [DISCONTINUED] meloxicam (MOBIC) 15 MG tablet Take 15 mg by mouth 2 (two) times daily as needed for pain.

## 2013-11-19 ENCOUNTER — Encounter: Payer: Self-pay | Admitting: Pulmonary Disease

## 2013-11-19 ENCOUNTER — Other Ambulatory Visit: Payer: Self-pay

## 2013-11-19 ENCOUNTER — Ambulatory Visit: Payer: Self-pay | Admitting: Pulmonary Disease

## 2013-11-19 DIAGNOSIS — R0602 Shortness of breath: Secondary | ICD-10-CM

## 2013-11-19 DIAGNOSIS — I519 Heart disease, unspecified: Secondary | ICD-10-CM

## 2013-11-19 LAB — PULMONARY FUNCTION TEST

## 2013-11-20 NOTE — Progress Notes (Signed)
Quick Note:  Spoke with pt to relay results. She is aware. Nothing further needed at this time. ______

## 2013-11-24 ENCOUNTER — Telehealth: Payer: Self-pay

## 2013-11-24 ENCOUNTER — Encounter: Payer: Self-pay | Admitting: Pulmonary Disease

## 2013-11-24 NOTE — Telephone Encounter (Signed)
Spoke with pt, she is aware of pft results.  Reminded her of her appt on 5/11 @4 :30.  Nothing further needed.

## 2013-11-24 NOTE — Telephone Encounter (Signed)
Message copied by Len Blalock on Tue Nov 24, 2013  9:20 AM ------      Message from: Juanito Doom      Created: Tue Nov 24, 2013  8:48 AM        Caryl Pina,            Please let her know that her pulmonary function tests were normal.            Thanks,      Ruby Cola ------

## 2013-11-30 ENCOUNTER — Ambulatory Visit: Payer: 59 | Admitting: Pulmonary Disease

## 2013-12-30 ENCOUNTER — Encounter: Payer: Self-pay | Admitting: Pulmonary Disease

## 2014-01-25 NOTE — Telephone Encounter (Signed)
This encounter was created in error - please disregard.

## 2014-04-05 ENCOUNTER — Ambulatory Visit: Payer: Self-pay | Admitting: Family Medicine

## 2014-04-21 ENCOUNTER — Ambulatory Visit (INDEPENDENT_AMBULATORY_CARE_PROVIDER_SITE_OTHER): Payer: 59 | Admitting: Physician Assistant

## 2014-04-21 ENCOUNTER — Encounter: Payer: Self-pay | Admitting: Physician Assistant

## 2014-04-21 VITALS — BP 120/84 | HR 107 | Ht 61.0 in | Wt 226.5 lb

## 2014-04-21 DIAGNOSIS — I519 Heart disease, unspecified: Secondary | ICD-10-CM

## 2014-04-21 DIAGNOSIS — R42 Dizziness and giddiness: Secondary | ICD-10-CM

## 2014-04-21 DIAGNOSIS — I5189 Other ill-defined heart diseases: Secondary | ICD-10-CM

## 2014-04-21 DIAGNOSIS — R0602 Shortness of breath: Secondary | ICD-10-CM

## 2014-04-21 DIAGNOSIS — K219 Gastro-esophageal reflux disease without esophagitis: Secondary | ICD-10-CM

## 2014-04-21 DIAGNOSIS — R079 Chest pain, unspecified: Secondary | ICD-10-CM

## 2014-04-21 DIAGNOSIS — IMO0001 Reserved for inherently not codable concepts without codable children: Secondary | ICD-10-CM

## 2014-04-21 DIAGNOSIS — I10 Essential (primary) hypertension: Secondary | ICD-10-CM

## 2014-04-21 DIAGNOSIS — Z01812 Encounter for preprocedural laboratory examination: Secondary | ICD-10-CM

## 2014-04-21 MED ORDER — ASPIRIN EC 81 MG PO TBEC: 81.0000 mg | DELAYED_RELEASE_TABLET | Freq: Every day | ORAL | Status: AC

## 2014-04-21 NOTE — Progress Notes (Signed)
Patient Name: Marie Vaughan, Marie Vaughan [birth year], MRN [id number]  Date of Encounter: 04/21/2014  Primary Care Provider:  Ashok Norris, MD Primary Cardiologist:  Dr. Fletcher Anon, MD  Patient Profile:  43 y.o. female with history of prolonged HTN which has been reasonably controlled, GERD, & obesity who presents to clinic today with continued chest pain and "swelling all over."     Problem List:   Past Medical History  Diagnosis Date  . Vaginal delivery     x 3  . Angio-edema   . HTN (hypertension)   . GERD (gastroesophageal reflux disease)   . Acne   . Anxiety   . Contact lens/glasses fitting     wears contacts or glasses  . Tear of medial meniscus of left knee   . Tear of medial meniscus of right knee   . Iron deficiency anemia secondary to blood loss (chronic)   . Migraine with aura, without mention of intractable migraine without mention of status migrainosus   . Acute bronchitis   . Pain in thoracic spine   . Pharyngitis   . Dysmenorrhea   . Anxiety state, unspecified   . 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 Cervicalgia   . Obesity   . History of stress test     a. Lexiscan 08/25/13: no sig ischemia, attenuation artifact noted, no sig WMA noted, EF 69%, no EKG changes concerning for ischemia   Past Surgical History  Procedure Laterality Date  . Ovarian cyst removal  04-25-10  . Abdominal hysterectomy  04-25-10    Due to abnormal PAP  . Knee arthroscopy with medial menisectomy Right 06/10/2013    Procedure: RIGHT KNEE ARTHROSCOPY WITH MEDIAL MENISECTOMY;  Surgeon: Lorn Junes, MD;  Location: [facility];  Service: Orthopedics;  Laterality: Right;  partial lateral menisectomoy and chondroplasty  . Knee arthroscopy Left 06/10/2013    Procedure: LEFT KNEE ARTHROSCOPY KNEE WITH PARTIAL MEDIAL MENISCECTOMY ;  Surgeon: Lorn Junes, MD;  Location: [facility];  Service: Orthopedics;  Laterality: Left;  xerofoam, 4x4's, webril, ace  wrap, ice wrap  . Dilation and curettage of uterus       Allergies:  Allergies  Allergen Reactions  . Chocolate     Hives, itching  . Iohexol     CP and SOB  . Peanut-Containing Drug Products Swelling    cashews     HPI:  43 y.o. female with the above problem list presents to clinic today with worsening/continued chest pain and "swelling all over."  Patient initially presented to our office on 08/21/2013 for evaluation of intermittent sharp substernal chest pain that radiated to the left side, lasting for 2 minutes. She was without any previous cardiac evaluation/history at that time of this evaluation. This pain was occuring at rest and resolved without intervention. It had occurred about 2 weeks prior to her presentation to her office visit and she had not had any further episodes in the interim. She also noted some exertional dyspnea, which had been chronic with occasional dizziness. She underwent Lexiscan Myoview on 08/25/13 which revealed no significant ischemia, attenuation artifact was noted, no significant WMA noted, EF 69%, and no EKG changes concerning for ischemia.   Since that office visit in January she has seen Pulmonology for evaluation in April 2015 for chest tightness, SOB, & wheezing. Per their note she had a D-dimer and CT of the chest, both of which were normal. She had an echo per part of her  pulmonology work up on 11/19/13 which showed: an EF of 55-60%, nl global LV systolic function, borderline concentric LVH, impaired relaxation pattern of LV diastolic filling, otherwise normal echo. No evidence of pulmonary hypertension. Her PFTs were normal. It was felt unlikely for her to be hainv recurrent PE without limitation in hemodynamics & her respiratory status. It was recommended that she follow up with a GI MD 2/2 to her known reflux vs treat for costochondritis.   Today she comes in stating that she is at the end of her rope. Her chest pain has continued to increase in both  frequency and severity to the point that she just cannot take it any longer (no thoughts of harming herself). Chest pain comes at random times, both at rest and with exertion and last for seconds to minutes. Chest pain is located along the left anterior chest and radiates inferolateral. It is not reproducible to palpation, however it can improve or worsen with movement (no rhyme or reason). There is some associated SOB at times. It was awoken her from sleep at times. This does not feel like her typical reflux - which she takes Nexium for. She has been taking aspirin 325 mg daily, and sometimes 650 mg daily since 07/2013. She states she has had an increase in "swelling all over." Weight is up 6 pounds since 07/2013. Notes early satiety. Sleeps with 2 pillows at baseline. No PND. Notes increased dyspnea lately. She wants to have definitive evaluation done - cardiac cath.          Home Medications:  Prior to Admission medications   Medication Sig Start Date End Date Taking? Authorizing Provider  ALPRAZolam Duanne Moron) 0.5 MG tablet Take 0.5 mg by mouth 2 (two) times daily as needed.     Historical Provider, MD  aspirin 325 MG tablet Take 0.5 mg by mouth daily.    Historical Provider, MD  esomeprazole (NEXIUM) 40 MG capsule Take 1 capsule (40 mg total) by mouth daily before breakfast. 01/05/13   Tanda Rockers, MD  olmesartan-hydrochlorothiazide (BENICAR HCT) 20-12.5 MG per tablet 40 mg daily    Historical Provider, MD     Weights: Filed Weights   04/21/14 1317  Weight: 226 lb 8 oz (102.74 kg)     Review of Systems:  All other systems reviewed and are otherwise negative except as noted above.  Physical Exam:  Blood pressure 120/84, pulse 107, height 5\' 1"  (1.549 m), weight 226 lb 8 oz (102.74 kg).  General: Pleasant, NAD Psych: Normal affect. Neuro: Alert and oriented X 3. Moves all extremities spontaneously. HEENT: Normal  Neck: Supple without bruits or JVD. Lungs:  Resp regular and unlabored,  CTA. Heart: RRR no s3, s4, or murmurs. Abdomen: Soft, non-tender, non-distended, BS + x 4.  Extremities: No clubbing, cyanosis or edema. DP/PT/Radials 2+ and equal bilaterally.   Accessory Clinical Findings:  EKG - sinus tachycardia, 105, nonspecific st/t changes, improved TWI from prior  Assessment & Plan:  1. Chest pain with low risk of cardiac etiology: -Atypical in presentation -Patient states her symptoms are worsening and she is at the end of her rope.  -Negative Lexiscan Myoview 07/2013, echo 10/2013: EF 12-87%, diastolic dysfunction, no pulmonary HTN, nl pulmonary eval to date -Given the increasing frequency & increasing severity of her symptoms will trend echo as well as schedule cardiac cath - discussed with Dr. Ellyn Hack, MD -Decrease aspirin 325 mg daily to aspirin 81 mg daily -Patient is concerned about her "swelling all over." I  do not appreciate any edema along her lower extremities today on exam, her JVP is not elevated, and she has an obese abdomen at baseline - on exam her abdomen was soft and not distended. I did not appreciate any gallops/rubs.  2. HTN: -Controlled today -Continue current medications  3. Reflux: -Symptoms do not feel like prior reflux -Continue Nexium  4. Diastolic dysfunction: -Check echo -Weight up 6 pounds since 07/2013  5. Obesity: -Healthy lifestyle choices     Christell Faith, PA-C [place], [place] [address] [phone] [place] 04/21/2014, 4:03 PM

## 2014-04-21 NOTE — Patient Instructions (Signed)
Your physician has requested that you have an echocardiogram. Echocardiography is a painless test that uses sound waves to create images of your heart. It provides your doctor with information about the size and shape of your heart and how well your heart's chambers and valves are working. This procedure takes approximately one hour. There are no restrictions for this procedure.  We will call you after your echo to set up your cath   Your physician recommends that you have labs today: BMP CBC TSH  INR   Your physician has recommended you make the following change in your medication:  Decrease Aspirin to 81 mg once daily

## 2014-04-22 ENCOUNTER — Other Ambulatory Visit (INDEPENDENT_AMBULATORY_CARE_PROVIDER_SITE_OTHER): Payer: 59

## 2014-04-22 ENCOUNTER — Other Ambulatory Visit: Payer: Self-pay

## 2014-04-22 DIAGNOSIS — R079 Chest pain, unspecified: Secondary | ICD-10-CM

## 2014-04-22 DIAGNOSIS — R0602 Shortness of breath: Secondary | ICD-10-CM

## 2014-04-22 HISTORY — PX: CARDIAC CATHETERIZATION: SHX172

## 2014-04-22 LAB — BASIC METABOLIC PANEL
BUN / CREAT RATIO: 15 (ref 9–23)
BUN: 11 mg/dL (ref 6–24)
CO2: 21 mmol/L (ref 18–29)
Calcium: 9.8 mg/dL (ref 8.7–10.2)
Chloride: 97 mmol/L (ref 97–108)
Creatinine, Ser: 0.71 mg/dL (ref 0.57–1.00)
GFR, EST AFRICAN AMERICAN: 120 mL/min/{1.73_m2} (ref >59)
GFR, EST NON AFRICAN AMERICAN: 104 mL/min/{1.73_m2} (ref >59)
Glucose: 87 mg/dL (ref 65–99)
Potassium: 3.8 mmol/L (ref 3.5–5.2)
SODIUM: 137 mmol/L (ref 134–144)

## 2014-04-22 LAB — CBC WITH DIFFERENTIAL
BASOS: 0 %
Basophils Absolute: 0 10*3/uL (ref 0.0–0.2)
EOS: 1 %
Eosinophils Absolute: 0.1 10*3/uL (ref 0.0–0.4)
HEMATOCRIT: 35.7 % (ref 34.0–46.6)
HEMOGLOBIN: 12 g/dL (ref 11.1–15.9)
Immature Grans (Abs): 0 10*3/uL (ref 0.0–0.1)
Immature Granulocytes: 0 %
LYMPHS ABS: 2.4 10*3/uL (ref 0.7–3.1)
Lymphs: 30 %
MCH: 27 pg (ref 26.6–33.0)
MCHC: 33.6 g/dL (ref 31.5–35.7)
MCV: 80 fL (ref 79–97)
MONOCYTES: 6 %
MONOS ABS: 0.4 10*3/uL (ref 0.1–0.9)
NEUTROS ABS: 4.9 10*3/uL (ref 1.4–7.0)
Neutrophils Relative %: 63 %
PLATELETS: 340 10*3/uL (ref 150–379)
RBC: 4.44 x10E6/uL (ref 3.77–5.28)
RDW: 14.6 % (ref 12.3–15.4)
WBC: 7.9 10*3/uL (ref 3.4–10.8)

## 2014-04-22 LAB — PROTIME-INR
INR: 1 (ref 0.8–1.2)
PROTHROMBIN TIME: 10.1 s (ref 9.1–12.0)

## 2014-04-22 LAB — TSH: TSH: 1.03 u[IU]/mL (ref 0.450–4.500)

## 2014-04-23 ENCOUNTER — Telehealth: Payer: Self-pay | Admitting: *Deleted

## 2014-04-23 NOTE — Telephone Encounter (Signed)
Informed patient per Christell Faith PA that echo appeared normal with a preliminary look  Will call early next week with final results

## 2014-04-27 ENCOUNTER — Telehealth: Payer: Self-pay | Admitting: *Deleted

## 2014-04-27 NOTE — Telephone Encounter (Signed)
Discussed patient issues with Dr. Fletcher Anon  Informed patient that Dr. Fletcher Anon can perform a cardiac cath on Thursday 04/29/14 Patient verbalized understanding  Will set up time tomorrow   Instructed patient to contact EMS if her situation became emergent while awaiting cath

## 2014-04-27 NOTE — Telephone Encounter (Signed)
Patient called and still having chest pains please call her.

## 2014-04-28 NOTE — Telephone Encounter (Signed)
[facility] Cardiac Cath Instructions   You are scheduled for a Cardiac Cath on:____10/08/15_____________________  Please arrive at _0830______am on the day of your procedure  You will need to pre-register prior to the day of your procedure.  Enter through the [employer] at [facility].  Registration is the first desk on your right.  Please take the procedure order we have given you in order to be registered appropriately  Do not eat/drink anything after midnight  Someone will need to drive you home  It is recommended someone be with you for the first 24 hours after your procedure  Wear clothes that are easy to get on/off and wear slip on shoes if possible   Medications bring a current list of all medications with you  _x__ You may take all of your medications the morning of your procedure with enough water to swallow safely    Day of your procedure: Arrive at the [facility] entrance.  Free valet service is available.  After entering the [facility] please check-in at the registration desk (1st desk on your right) to receive your armband. After receiving your armband someone will escort you to the cardiac cath/special procedures waiting area.  The usual length of stay after your procedure is about 2 to 3 hours.  This can vary.  If you have any questions, please call our office at [phone], or you may call the cardiac cath lab at [facility] directly at [phone]  Reviewed cath instructions with patient   Dr. Fletcher Anon verbally conformed labs are recent enough

## 2014-04-28 NOTE — Telephone Encounter (Signed)
Cath orders faxed  Juliann Pulse confirmed receipt

## 2014-04-29 ENCOUNTER — Ambulatory Visit: Payer: Self-pay | Admitting: Cardiovascular Disease

## 2014-04-29 DIAGNOSIS — R079 Chest pain, unspecified: Secondary | ICD-10-CM

## 2014-05-03 ENCOUNTER — Encounter: Payer: Self-pay | Admitting: *Deleted

## 2014-05-04 ENCOUNTER — Encounter: Payer: Self-pay | Admitting: Physician Assistant

## 2014-05-05 ENCOUNTER — Telehealth: Payer: Self-pay | Admitting: *Deleted

## 2014-05-05 NOTE — Telephone Encounter (Signed)
Informed patient of Dr. Aridas response  Patient verbalized understanding  

## 2014-05-05 NOTE — Telephone Encounter (Signed)
She did have a small hematoma after the procedure. As long as it is not getting worse, we can evaluate this when she comes back for follow up. IT should gradually improve.  Her cath was normal.

## 2014-05-05 NOTE — Telephone Encounter (Signed)
Patient called and stated her wrist hand and arm from the cath are sore and puffy  She is also having a lot of bruising and experiencing some numbness and tingling in that arm  She wants to make sure that this is normal   She would also like her cath results

## 2014-05-10 ENCOUNTER — Ambulatory Visit (INDEPENDENT_AMBULATORY_CARE_PROVIDER_SITE_OTHER): Payer: 59 | Admitting: Cardiovascular Disease

## 2014-05-10 ENCOUNTER — Encounter: Payer: Self-pay | Admitting: Cardiovascular Disease

## 2014-05-10 VITALS — BP 122/94 | HR 94 | Ht 62.0 in | Wt 225.5 lb

## 2014-05-10 DIAGNOSIS — R079 Chest pain, unspecified: Secondary | ICD-10-CM

## 2014-05-10 DIAGNOSIS — R0789 Other chest pain: Secondary | ICD-10-CM

## 2014-05-10 DIAGNOSIS — I10 Essential (primary) hypertension: Secondary | ICD-10-CM

## 2014-05-10 MED ORDER — OLMESARTAN MEDOXOMIL-HCTZ 40-25 MG PO TABS: 1.0000 | ORAL_TABLET | Freq: Every day | ORAL | Status: DC

## 2014-05-10 NOTE — Patient Instructions (Signed)
Change Olmesartan-HCTZ to 40-25 mg once daily.   Your physician wants you to follow-up in: 6 months.  You will receive a reminder letter in the mail two months in advance. If you don't receive a letter, please call our office to schedule the follow-up appointment.  Your next appointment will be scheduled in our new office located at :  [place]  [address], [place]  [place], [place] [address]

## 2014-05-10 NOTE — Assessment & Plan Note (Signed)
She complains of mild edema. Left ventricular end-diastolic pressure was mildly elevated. Thus, I increased the dose of Olmesartan-HCTZ to 40-25 mg once daily.

## 2014-05-10 NOTE — Assessment & Plan Note (Signed)
The chest pain does not seem to be cardiac. Recent cardiac catheterization showed normal coronary arteries. The etiology is most likely musculoskeletal with a component of anxiety.

## 2014-05-10 NOTE — Progress Notes (Signed)
Primary care physician: Dr. Rutherford Nail  HPI  This is a pleasant 43 year old female who is here today for a followup visit regarding chest pain. 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 She has prolonged history of hypertension which has been reasonably controlled. She has strong family history of hypertension but not coronary artery disease. Other medical problems include gastroesophageal reflux disease and obesity.  She was seen for recurrent chest pain in spite of negative noninvasive cardiac workup. Due to continued symptoms and shortness of breath, I proceeded with cardiac catheterization which showed normal coronary arteries and ejection fraction of 60%. Left ventricular end-diastolic pressure was mildly elevated. The procedure was done via the right radial artery. She had a small hematoma that was managed conservatively. She did have some bruising but that has resolved. She still complains of mild discomfort at the cath site. She continues to have intermittent episodes of sharp chest pain mostly at rest. It's worse with certain movements.  Allergies  Allergen Reactions  . Chocolate     Hives, itching  . Iohexol     CP and SOB  . Peanut-Containing Drug Products Swelling    cashews     Current Outpatient Prescriptions on File Prior to Visit  Medication Sig Dispense Refill  . ALPRAZolam (XANAX) 0.5 MG tablet Take 0.5 mg by mouth 2 (two) times daily as needed.       Marland Kitchen aspirin EC 81 MG tablet Take 1 tablet (81 mg total) by mouth daily.  90 tablet  3  . esomeprazole (NEXIUM) 40 MG capsule Take 1 capsule (40 mg total) by mouth daily before breakfast.       No current facility-administered medications on file prior to visit.     Past Medical History  Diagnosis Date  . Vaginal delivery     x 3  . Angio-edema   . HTN (hypertension)   . GERD (gastroesophageal reflux disease)   . Acne   . Anxiety   . Contact lens/glasses fitting     wears contacts or glasses  . Tear  of medial meniscus of left knee   . Tear of medial meniscus of right knee   . Iron deficiency anemia secondary to blood loss (chronic)   . Migraine with aura, without mention of intractable migraine without mention of status migrainosus   . Acute bronchitis   . Pain in thoracic spine   . Pharyngitis   . Dysmenorrhea   . Anxiety state, unspecified   . 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 Cervicalgia   . Obesity   . History of stress test     a. Lexiscan 08/25/13: no sig ischemia, attenuation artifact noted, no sig WMA noted, EF 69%, no EKG changes concerning for ischemia  . History of cardiac cath     a. 04/29/2014: LM nl, LAD no dz, D1 no dz, D2 very small vessel, D3 very small vessel, LCx nl, OM1 very small vessel, OM2 medium sized vessel, OM 3 no dz, RCA no dz, PDA no dz      Past Surgical History  Procedure Laterality Date  . Ovarian cyst removal  04-25-10  . Abdominal hysterectomy  04-25-10    Due to abnormal PAP  . Knee arthroscopy with medial menisectomy Right 06/10/2013    Procedure: RIGHT KNEE ARTHROSCOPY WITH MEDIAL MENISECTOMY;  Surgeon: Lorn Junes, MD;  Location: [facility];  Service: Orthopedics;  Laterality: Right;  partial lateral menisectomoy and chondroplasty  . Knee arthroscopy  Left 06/10/2013    Procedure: LEFT KNEE ARTHROSCOPY KNEE WITH PARTIAL MEDIAL MENISCECTOMY ;  Surgeon: Lorn Junes, MD;  Location: [facility];  Service: Orthopedics;  Laterality: Left;  xerofoam, 4x4's, webril, ace wrap, ice wrap  . Dilation and curettage of uterus    . Cardiac catheterization  04/2014    [facility]     Family History  Problem Relation Age of Onset  . Early death Mother     Murdered when pt was 18 years old  . Diabetes Mother   . Hypertension Mother   . Gout Mother   . Kidney failure Mother   . Early death Sister 20    due to hemorage  . Alcohol abuse Maternal Uncle   . Alcohol abuse Other   . Arthritis Other   . Heart  disease Other   . Other Other     cousin- phlebitis  . Diabetes Maternal Aunt   . Hyperlipidemia Maternal Aunt   . Hypertension Maternal Aunt   . Prostate cancer Maternal Uncle      History   Social History  . Marital Status: Single    Spouse Name: N/A    Number of Children: 3  . Years of Education: N/A   Occupational History  . Medical records - [employer]    Social History Main Topics  . Smoking status: Never Smoker   . Smokeless tobacco: Never Used  . Alcohol Use: No  . Drug Use: No  . Sexual Activity: Not on file   Other Topics Concern  . Not on file   Social History Narrative   Lives in [place] with fiance, and 2 children 1 and 16. Works at [employer].      Regular Exercise -  Walk, 30 - 1 hr daily   Daily Caffeine Use:  1 cup coffee AM (in cold weather)              ROS A 10 point review of system was performed. It is negative other than that mentioned in the history of present illness.   PHYSICAL EXAM   BP 122/94  Pulse 94  Ht 5\' 2"  (1.575 m)  Wt 225 lb 8 oz (102.286 kg)  BMI 41.23 kg/m2 Constitutional: She is oriented to person, place, and time. She appears well-developed and well-nourished. No distress.  HENT: No nasal discharge.  Head: Normocephalic and atraumatic.  Eyes: Pupils are equal and round. No discharge.  Neck: Normal range of motion. Neck supple. No JVD present. No thyromegaly present.  Cardiovascular: Normal rate, regular rhythm, normal heart sounds. Exam reveals no gallop and no friction rub. No murmur heard.  Pulmonary/Chest: Effort normal and breath sounds normal. No stridor. No respiratory distress. She has no wheezes. She has no rales. She exhibits no tenderness.  Abdominal: Soft. Bowel sounds are normal. She exhibits no distension. There is no tenderness. There is no rebound and no guarding.  Musculoskeletal: Normal range of motion. She exhibits no edema and no tenderness.  Neurological: She is alert and oriented to  person, place, and time. Coordination normal.  Skin: Skin is warm and dry. No rash noted. She is not diaphoretic. No erythema. No pallor.  Psychiatric: She has a normal mood and affect. Her behavior is normal. Judgment and thought content normal.  Right radial pulse is normal with no hematoma or bruising   EKG: Sinus  Rhythm  WITHIN NORMAL LIMITS   ASSESSMENT AND PLAN

## 2014-05-11 ENCOUNTER — Encounter: Payer: Self-pay | Admitting: Cardiovascular Disease

## 2014-09-03 ENCOUNTER — Ambulatory Visit: Payer: Self-pay | Admitting: Family Medicine

## 2015-01-18 ENCOUNTER — Encounter: Payer: Self-pay | Admitting: Family Medicine

## 2015-01-18 ENCOUNTER — Ambulatory Visit (INDEPENDENT_AMBULATORY_CARE_PROVIDER_SITE_OTHER): Payer: 59 | Admitting: Family Medicine

## 2015-01-18 VITALS — BP 128/70 | HR 102 | Temp 97.8°F | Resp 16 | Ht 62.0 in | Wt 205.4 lb

## 2015-01-18 DIAGNOSIS — I10 Essential (primary) hypertension: Secondary | ICD-10-CM

## 2015-01-18 DIAGNOSIS — E669 Obesity, unspecified: Secondary | ICD-10-CM | POA: Diagnosis not present

## 2015-01-18 MED ORDER — PHENTERMINE HCL 37.5 MG PO CAPS: 37.5000 mg | ORAL_CAPSULE | ORAL | Status: DC

## 2015-01-18 NOTE — Patient Instructions (Signed)

## 2015-01-18 NOTE — Progress Notes (Signed)
Name: Marie Vaughan   MRN: [id number]    DOB: 1970-09-08   Date:01/18/2015       Progress Note  Subjective  Chief Complaint  Chief Complaint  Patient presents with  . Obesity    weight has been 203.9-205    Hypertension This is a chronic problem. The current episode started more than 1 year ago. The problem is unchanged. The problem is controlled. Associated symptoms include chest pain and palpitations. Pertinent negatives include no blurred vision, headaches, neck pain, orthopnea or shortness of breath. Agents associated with hypertension include amphetamines. Risk factors for coronary artery disease include family history, obesity and sedentary lifestyle. Past treatments include angiotensin blockers and diuretics. The current treatment provides moderate improvement. There are no compliance problems.       Past Medical History  Diagnosis Date  . Vaginal delivery     x 3  . Angio-edema   . HTN (hypertension)   . GERD (gastroesophageal reflux disease)   . Acne   . Anxiety   . Contact lens/glasses fitting     wears contacts or glasses  . Tear of medial meniscus of left knee   . Tear of medial meniscus of right knee   . Iron deficiency anemia secondary to blood loss (chronic)   . Migraine with aura, without mention of intractable migraine without mention of status migrainosus   . Acute bronchitis   . Pain in thoracic spine   . Pharyngitis   . Dysmenorrhea   . Anxiety state, unspecified   . 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 Cervicalgia   . Obesity   . History of stress test     a. Lexiscan 08/25/13: no sig ischemia, attenuation artifact noted, no sig WMA noted, EF 69%, no EKG changes concerning for ischemia  . History of cardiac cath     a. 04/29/2014: LM nl, LAD no dz, D1 no dz, D2 very small vessel, D3 very small vessel, LCx nl, OM1 very small vessel, OM2 medium sized vessel, OM 3 no dz, RCA no dz, PDA no dz     History  Substance Use Topics  . Smoking  status: Never Smoker   . Smokeless tobacco: Never Used  . Alcohol Use: No     Current outpatient prescriptions:  .  phentermine 37.5 MG capsule, Take 37.5 mg by mouth every morning., Disp: , Rfl:  .  ALPRAZolam (XANAX) 0.5 MG tablet, Take 0.5 mg by mouth 2 (two) times daily as needed. , Disp: , Rfl:  .  aspirin EC 81 MG tablet, Take 1 tablet (81 mg total) by mouth daily., Disp: 90 tablet, Rfl: 3 .  esomeprazole (NEXIUM) 40 MG capsule, Take 1 capsule (40 mg total) by mouth daily before breakfast., Disp: , Rfl:  .  olmesartan-hydrochlorothiazide (BENICAR HCT) 40-25 MG per tablet, Take 1 tablet by mouth daily., Disp: 30 tablet, Rfl: 6  Allergies  Allergen Reactions  . Chocolate     Hives, itching  . Iohexol     CP and SOB  . Peanut-Containing Drug Products Swelling    cashews    Review of Systems  Constitutional: Negative for fever, chills and weight loss.  HENT: Negative for congestion, hearing loss, sore throat and tinnitus.   Eyes: Negative for blurred vision, double vision and redness.  Respiratory: Negative for cough, hemoptysis and shortness of breath.   Cardiovascular: Positive for chest pain, palpitations and leg swelling. Negative for orthopnea and claudication.  Gastrointestinal: Negative for heartburn, nausea,  vomiting, diarrhea, constipation and blood in stool.  Genitourinary: Negative for dysuria, urgency, frequency and hematuria.  Musculoskeletal: Negative for myalgias, back pain, joint pain, falls and neck pain.  Skin: Negative for itching.  Neurological: Negative for dizziness, tingling, tremors, focal weakness, seizures, loss of consciousness, weakness and headaches.  Endo/Heme/Allergies: Does not bruise/bleed easily.  Psychiatric/Behavioral: Negative for depression and substance abuse. The patient is nervous/anxious. The patient does not have insomnia.      Objective  Filed Vitals:   01/18/15 0750  BP: 128/70  Pulse: 102  Temp: 97.8 F (36.6 C)  TempSrc:  Oral  Resp: 16  Height: 5\' 2"  (1.575 m)  Weight: 205 lb 6.4 oz (93.169 kg)  SpO2: 98%     Physical Exam  Constitutional: She is oriented to person, place, and time and well-developed, well-nourished, and in no distress.  HENT:  Head: Normocephalic.  Eyes: EOM are normal. Pupils are equal, round, and reactive to light.  Neck: Normal range of motion. No thyromegaly present.  Cardiovascular: Normal rate, regular rhythm and normal heart sounds.   No murmur heard. Pulmonary/Chest: Effort normal and breath sounds normal.  Abdominal: Soft. Bowel sounds are normal.  Musculoskeletal: Normal range of motion. She exhibits no edema.  Neurological: She is alert and oriented to person, place, and time. No cranial nerve deficit. Gait normal.  Skin: Skin is warm and dry. No rash noted.  Psychiatric: Memory and affect normal.   Obesity  Patient has been working out on a regular basis about 4 days per week with about 15 minutes of aerobic exercise. She does admit to dietary indiscretion of recent. There's been no insomnia or jitteriness or other problems are taken the phentermine at this point   Assessment & Plan  1. Essential hypertension Stable  2. Obesity We'll give another 3 month trial with increasing exercise - phentermine 37.5 MG capsule; Take 1 capsule (37.5 mg total) by mouth every morning.  Dispense: 30 capsule; Refill: 2

## 2015-03-10 ENCOUNTER — Ambulatory Visit (INDEPENDENT_AMBULATORY_CARE_PROVIDER_SITE_OTHER): Payer: 59 | Admitting: Family Medicine

## 2015-03-10 ENCOUNTER — Encounter: Payer: Self-pay | Admitting: Family Medicine

## 2015-03-10 VITALS — BP 122/70 | HR 103 | Temp 98.5°F | Resp 18 | Ht 61.0 in | Wt 204.0 lb

## 2015-03-10 DIAGNOSIS — J40 Bronchitis, not specified as acute or chronic: Secondary | ICD-10-CM | POA: Diagnosis not present

## 2015-03-10 DIAGNOSIS — H6500 Acute serous otitis media, unspecified ear: Secondary | ICD-10-CM

## 2015-03-10 DIAGNOSIS — J0100 Acute maxillary sinusitis, unspecified: Secondary | ICD-10-CM | POA: Diagnosis not present

## 2015-03-10 DIAGNOSIS — H6590 Unspecified nonsuppurative otitis media, unspecified ear: Secondary | ICD-10-CM | POA: Insufficient documentation

## 2015-03-10 MED ORDER — FLUTICASONE PROPIONATE 50 MCG/ACT NA SUSP: 2.0000 | Freq: Every day | NASAL | Status: DC

## 2015-03-10 MED ORDER — DEXTROMETHORPHAN-GUAIFENESIN 5-100 MG/5ML PO LIQD: 2.0000 | Freq: Two times a day (BID) | ORAL | Status: DC

## 2015-03-10 MED ORDER — AZITHROMYCIN 500 MG PO TABS: 500.0000 mg | ORAL_TABLET | Freq: Every day | ORAL | Status: DC

## 2015-03-10 MED ORDER — PREDNISONE 20 MG PO TABS: 20.0000 mg | ORAL_TABLET | Freq: Two times a day (BID) | ORAL | Status: DC

## 2015-03-10 MED ORDER — TRAMADOL HCL 50 MG PO TABS: 50.0000 mg | ORAL_TABLET | Freq: Three times a day (TID) | ORAL | Status: DC | PRN

## 2015-03-10 NOTE — Progress Notes (Signed)
Name: Marie Vaughan   MRN: [id number]    DOB: Apr 24, 1971   Date:03/10/2015       Progress Note  Subjective  Chief Complaint  Chief Complaint  Patient presents with  . Sinusitis    cough, hoarness, drainage  for 4 days    HPI  Patient presents with a four-day history of cough and hoarseness nasal drainage and purulent sputum production. In addition she has bilateral ear pain worse on the right than the left. There is hoarseness. There is irritation and pain over the tracheal region. The nasal passages are quite swollen and she has drainage during the night. At one point earlier today she had a severe episode of coughing and had some blood-tinged sputum production as well.  It is felt the patient has had similar symptoms within the last year. 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    Past Medical History  Diagnosis Date  . Vaginal delivery     x 3  . Angio-edema   . HTN (hypertension)   . GERD (gastroesophageal reflux disease)   . Acne   . Anxiety   . Contact lens/glasses fitting     wears contacts or glasses  . Tear of medial meniscus of left knee   . Tear of medial meniscus of right knee   . Iron deficiency anemia secondary to blood loss (chronic)   . Migraine with aura, without mention of intractable migraine without mention of status migrainosus   . Acute bronchitis   . Pain in thoracic spine   . Pharyngitis   . Dysmenorrhea   . Anxiety state, unspecified   . 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 Cervicalgia   . Obesity   . History of stress test     a. Lexiscan 08/25/13: no sig ischemia, attenuation artifact noted, no sig WMA noted, EF 69%, no EKG changes concerning for ischemia  . History of cardiac cath     a. 04/29/2014: LM nl, LAD no dz, D1 no dz, D2 very small vessel, D3 very small vessel, LCx nl, OM1 very small vessel,  OM2 medium sized vessel, OM 3 no dz, RCA no dz, PDA no dz     Social History  Substance Use Topics  . Smoking status: Never Smoker   . Smokeless tobacco: Never Used  . Alcohol Use: No     Current outpatient prescriptions:  .  ALPRAZolam (XANAX) 0.5 MG tablet, Take 0.5 mg by mouth 2 (two) times daily as needed. , Disp: , Rfl:  .  aspirin EC 81 MG tablet, Take 1 tablet (81 mg total) by mouth daily., Disp: 90 tablet, Rfl: 3 .  esomeprazole (NEXIUM) 40 MG capsule, Take 1 capsule (40 mg total) by mouth daily before breakfast., Disp: , Rfl:  .  olmesartan-hydrochlorothiazide (BENICAR HCT) 40-25 MG per tablet, Take 1 tablet by mouth daily., Disp: 30 tablet, Rfl: 6 .  phentermine 37.5 MG capsule, Take 1 capsule (37.5 mg total) by mouth every morning., Disp: 30 capsule, Rfl: 2  Allergies  Allergen Reactions  . Chocolate     Hives, itching  . Iohexol     CP and SOB  . Peanut-Containing Drug Products Swelling    cashews    Review of Systems  Constitutional: Positive for malaise/fatigue. Negative for fever, chills and weight loss.  HENT: Positive for ear pain. Negative for congestion,  hearing loss, sore throat and tinnitus.   Eyes: Negative for blurred vision, double vision and redness.  Respiratory: Positive for cough, hemoptysis, sputum production and shortness of breath.   Cardiovascular: Positive for chest pain. Negative for palpitations, orthopnea, claudication and leg swelling.  Gastrointestinal: Negative for heartburn, nausea, vomiting, diarrhea, constipation and blood in stool.  Genitourinary: Negative for dysuria, urgency, frequency and hematuria.  Musculoskeletal: Negative for myalgias, back pain, joint pain, falls and neck pain.  Skin: Negative for itching.  Neurological: Positive for weakness and headaches. Negative for dizziness, tingling, tremors, focal weakness, seizures and loss of consciousness.  Endo/Heme/Allergies: Does not bruise/bleed easily.  Psychiatric/Behavioral:  Negative for depression and substance abuse. The patient is not nervous/anxious and does not have insomnia.      Objective  Filed Vitals:   03/10/15 1330  BP: 122/70  Pulse: 103  Temp: 98.5 F (36.9 C)  TempSrc: Oral  Resp: 18  Height: 5\' 1"  (1.549 m)  Weight: 204 lb (92.534 kg)  SpO2: 99%     Physical Exam  Constitutional: She is oriented to person, place, and time.  Modestly obese  HENT:  Dull TMs with slight bulge in particular on the right Some tenderness to palpation over the tracheal area anterior  Eyes: EOM are normal. Pupils are equal, round, and reactive to light.  Neck: Normal range of motion. No thyromegaly present.  Cardiovascular: Normal rate, regular rhythm and normal heart sounds.   No murmur heard. Pulmonary/Chest: Effort normal and breath sounds normal.  Abdominal: Soft. Bowel sounds are normal.  Musculoskeletal: Normal range of motion. She exhibits no edema.  Neurological: She is alert and oriented to person, place, and time. No cranial nerve deficit. Gait normal.  Skin: Skin is warm and dry. No rash noted.  Psychiatric: Memory and affect normal.      Assessment & Plan   1. Tracheobronchitis  - traMADol (ULTRAM) 50 MG tablet; Take 1 tablet (50 mg total) by mouth every 8 (eight) hours as needed.  Dispense: 30 tablet; Refill: 0 - azithromycin (ZITHROMAX) 500 MG tablet; Take 1 tablet (500 mg total) by mouth daily.  Dispense: 7 tablet; Refill: 0 - predniSONE (DELTASONE) 20 MG tablet; Take 1 tablet (20 mg total) by mouth 2 (two) times daily with a meal.  Dispense: 10 tablet; Refill: 0 - Dextromethorphan-Guaifenesin (DELSYM COUGH/CHEST CONGEST DM) 5-100 MG/5ML LIQD; Take 2 Doses by mouth 2 (two) times daily.  Dispense: 1 Bottle; Refill: 0  2. Acute serous otitis media, recurrence not specified, unspecified laterality   3. Subacute maxillary sinusitis  - predniSONE (DELTASONE) 20 MG tablet; Take 1 tablet (20 mg total) by mouth 2 (two) times daily with  a meal.  Dispense: 10 tablet; Refill: 0 - fluticasone (FLONASE) 50 MCG/ACT nasal spray; Place 2 sprays into both nostrils daily.  Dispense: 16 g; Refill: 1

## 2015-03-18 ENCOUNTER — Ambulatory Visit (INDEPENDENT_AMBULATORY_CARE_PROVIDER_SITE_OTHER): Payer: 59 | Admitting: Cardiovascular Disease

## 2015-03-18 ENCOUNTER — Encounter: Payer: Self-pay | Admitting: Cardiovascular Disease

## 2015-03-18 VITALS — BP 110/62 | HR 89 | Ht 61.0 in | Wt 204.5 lb

## 2015-03-18 DIAGNOSIS — R0789 Other chest pain: Secondary | ICD-10-CM | POA: Diagnosis not present

## 2015-03-18 DIAGNOSIS — I10 Essential (primary) hypertension: Secondary | ICD-10-CM

## 2015-03-18 NOTE — Assessment & Plan Note (Signed)
Blood pressure is well controlled after increasing the dose of olmesartan-hydrochlorothiazide.

## 2015-03-18 NOTE — Assessment & Plan Note (Signed)
Normal cardiac catheterization last year. The episodes are much less frequent. Follow-up as needed.

## 2015-03-18 NOTE — Patient Instructions (Signed)
Medication Instructions: Continue same medications.   Labwork: None.   Procedures/Testing: None.   Follow-Up: As need.   Any Additional Special Instructions Will Be Listed Below (If Applicable).

## 2015-03-18 NOTE — Progress Notes (Signed)
Primary care physician: Dr. Rutherford Nail  HPI  This is a pleasant 44 year old female who is here today for a followup visit regarding chest pain.  She has prolonged history of hypertension which has been reasonably controlled. She has strong family history of hypertension but not coronary artery disease. Other medical problems include gastroesophageal reflux disease and obesity.  She was seen for recurrent chest pain in spite of negative noninvasive cardiac workup. Due to continued symptoms and shortness of breath, I proceeded with cardiac catheterization in 04/2014 which showed normal coronary arteries and ejection fraction of 60%. Left ventricular end-diastolic pressure was mildly elevated.  The dose of OLMESARTAN-HYDROCHLOROTHIAZIDE  was increased during last visit. She has been doing very well. She has been working on diet and exercise and lost 20 pounds. She is feeling better overall with much less dyspnea and rare episodes of sharp chest pain.    Allergies  Allergen Reactions  . Chocolate     Hives, itching  . Iohexol     CP and SOB  . Peanut-Containing Drug Products Swelling    cashews     Current Outpatient Prescriptions on File Prior to Visit  Medication Sig Dispense Refill  . ALPRAZolam (XANAX) 0.5 MG tablet Take 0.5 mg by mouth 2 (two) times daily as needed.     Marland Kitchen aspirin EC 81 MG tablet Take 1 tablet (81 mg total) by mouth daily. 90 tablet 3  . Dextromethorphan-Guaifenesin (DELSYM COUGH/CHEST CONGEST DM) 5-100 MG/5ML LIQD Take 2 Doses by mouth 2 (two) times daily. 1 Bottle 0  . esomeprazole (NEXIUM) 40 MG capsule Take 1 capsule (40 mg total) by mouth daily before breakfast.    . olmesartan-hydrochlorothiazide (BENICAR HCT) 40-25 MG per tablet Take 1 tablet by mouth daily. 30 tablet 6  . phentermine 37.5 MG capsule Take 1 capsule (37.5 mg total) by mouth every morning. 30 capsule 2  . traMADol (ULTRAM) 50 MG tablet Take 1 tablet (50 mg total) by mouth every 8 (eight) hours as  needed. 30 tablet 0   No current facility-administered medications on file prior to visit.     Past Medical History  Diagnosis Date  . Vaginal delivery     x 3  . Angio-edema   . HTN (hypertension)   . GERD (gastroesophageal reflux disease)   . Acne   . Anxiety   . Contact lens/glasses fitting     wears contacts or glasses  . Tear of medial meniscus of left knee   . Tear of medial meniscus of right knee   . Iron deficiency anemia secondary to blood loss (chronic)   . Migraine with aura, without mention of intractable migraine without mention of status migrainosus   . Acute bronchitis   . Pain in thoracic spine   . Pharyngitis   . Dysmenorrhea   . Anxiety state, unspecified   . 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 Cervicalgia   . Obesity   . History of stress test     a. Lexiscan 08/25/13: no sig ischemia, attenuation artifact noted, no sig WMA noted, EF 69%, no EKG changes concerning for ischemia  . History of cardiac cath     a. 04/29/2014: LM nl, LAD no dz, D1 no dz, D2 very small vessel, D3 very small vessel, LCx nl, OM1 very small vessel, OM2 medium sized vessel, OM 3 no dz, RCA no dz, PDA no dz      Past Surgical History  Procedure Laterality Date  . Ovarian cyst  removal  04-25-10  . Abdominal hysterectomy  04-25-10    Due to abnormal PAP  . Knee arthroscopy with medial menisectomy Right 06/10/2013    Procedure: RIGHT KNEE ARTHROSCOPY WITH MEDIAL MENISECTOMY;  Surgeon: Lorn Junes, MD;  Location: [place];  Service: Orthopedics;  Laterality: Right;  partial lateral menisectomoy and chondroplasty  . Knee arthroscopy Left 06/10/2013    Procedure: LEFT KNEE ARTHROSCOPY KNEE WITH PARTIAL MEDIAL MENISCECTOMY ;  Surgeon: Lorn Junes, MD;  Location: [place];  Service: Orthopedics;  Laterality: Left;  xerofoam, 4x4's, webril, ace wrap, ice wrap  . Dilation and curettage of uterus    . Cardiac catheterization  04/2014     [facility]     Family History  Problem Relation Age of Onset  . Early death Mother     Murdered when pt was 19 years old  . Diabetes Mother   . Hypertension Mother   . Gout Mother   . Kidney failure Mother   . Early death Sister 85    due to hemorage  . Alcohol abuse Maternal Uncle   . Alcohol abuse Other   . Arthritis Other   . Heart disease Other   . Other Other     cousin- phlebitis  . Diabetes Maternal Aunt   . Hyperlipidemia Maternal Aunt   . Hypertension Maternal Aunt   . Prostate cancer Maternal Uncle      Social History   Social History  . Marital Status: Single    Spouse Name: N/A  . Number of Children: 3  . Years of Education: N/A   Occupational History  . Medical records - [employer]    Social History Main Topics  . Smoking status: Never Smoker   . Smokeless tobacco: Never Used  . Alcohol Use: No  . Drug Use: No  . Sexual Activity:    Partners: Male   Other Topics Concern  . Not on file   Social History Narrative   Lives in [place] with fiance, and 2 children 53 and 16. Works at [employer].      Regular Exercise -  Walk, 30 - 1 hr daily   Daily Caffeine Use:  1 cup coffee AM (in cold weather)              ROS A 10 point review of system was performed. It is negative other than that mentioned in the history of present illness.   PHYSICAL EXAM   BP 110/62 mmHg  Pulse 89  Ht 5\' 1"  (1.549 m)  Wt 204 lb 8 oz (92.761 kg)  BMI 38.66 kg/m2 Constitutional: She is oriented to person, place, and time. She appears well-developed and well-nourished. No distress.  HENT: No nasal discharge.  Head: Normocephalic and atraumatic.  Eyes: Pupils are equal and round. No discharge.  Neck: Normal range of motion. Neck supple. No JVD present. No thyromegaly present.  Cardiovascular: Normal rate, regular rhythm, normal heart sounds. Exam reveals no gallop and no friction rub. No murmur heard.  Pulmonary/Chest: Effort normal and breath sounds normal. No  stridor. No respiratory distress. She has no wheezes. She has no rales. She exhibits no tenderness.  Abdominal: Soft. Bowel sounds are normal. She exhibits no distension. There is no tenderness. There is no rebound and no guarding.  Musculoskeletal: Normal range of motion. She exhibits no edema and no tenderness.  Neurological: She is alert and oriented to person, place, and time. Coordination normal.  Skin: Skin is  warm and dry. No rash noted. She is not diaphoretic. No erythema. No pallor.  Psychiatric: She has a normal mood and affect. Her behavior is normal. Judgment and thought content normal.     EKG: Sinus  Rhythm  -  Nonspecific T-abnormality.   ABNORMAL    ASSESSMENT AND PLAN

## 2015-04-20 ENCOUNTER — Encounter: Payer: Self-pay | Admitting: Family Medicine

## 2015-04-20 ENCOUNTER — Ambulatory Visit (INDEPENDENT_AMBULATORY_CARE_PROVIDER_SITE_OTHER): Payer: 59 | Admitting: Family Medicine

## 2015-04-20 VITALS — BP 112/64 | HR 106 | Temp 98.3°F | Resp 18 | Wt 204.7 lb

## 2015-04-20 DIAGNOSIS — I10 Essential (primary) hypertension: Secondary | ICD-10-CM

## 2015-04-20 DIAGNOSIS — E162 Hypoglycemia, unspecified: Secondary | ICD-10-CM | POA: Diagnosis not present

## 2015-04-20 DIAGNOSIS — E669 Obesity, unspecified: Secondary | ICD-10-CM

## 2015-04-20 DIAGNOSIS — Z23 Encounter for immunization: Secondary | ICD-10-CM | POA: Diagnosis not present

## 2015-04-20 LAB — GLUCOSE, POCT (MANUAL RESULT ENTRY): POC GLUCOSE: 97 mg/dL (ref 70–99)

## 2015-04-20 LAB — POCT GLYCOSYLATED HEMOGLOBIN (HGB A1C): Hemoglobin A1C: 5.3

## 2015-04-20 MED ORDER — PHENTERMINE HCL 37.5 MG PO CAPS: 37.5000 mg | ORAL_CAPSULE | ORAL | Status: DC

## 2015-04-20 NOTE — Progress Notes (Signed)
Name: Marie Vaughan   MRN: [id number]    DOB: [birth year]   Date:04/20/2015       Progress Note  Subjective  Chief Complaint  Chief Complaint  Patient presents with  . Weight Loss    patient is here for her 65-month follow-up    HPI  Hypertension   Patient presents for follow-up of hypertension. It has been present for over 5 years.  Patient states that there is compliance with medical regimen which consists of Benicar 40-25 daily. There is no end organ disease. Cardiac risk factors include hypertension hyperlipidemia and diabetes.  Exercise regimen consist of walking and calorie and fat restriction .  Diet consist of as mentioned .  Obesity  Patient has a history of obesity for many years.  Attempts at weight loss have included diet and exercise and phentermine .  Results of this regimen  have been modest with most recent weight loss of 1 pound over the last 2-3 months .  Patient now voices and interest in weight loss by  continuation of same with diet and exercise being warmer dedicated your recent holidays .  Hypoglycemia  Patient experiencing episodes of what sounds to be hypoglycemia. After eating a carbohydrate laden meal she often has episodes of dizziness and shakiness speech impairment foggy head. After eating a snack particularly a high calorie snacks she is improved. There is a very positive family history of diabetes in her mother. She does not always consume protein in her meals.   Anxiety  Patient's anxiety is better control at this point. She is using Xanax on a when necessary basis only.    Past Medical History  Diagnosis Date  . Vaginal delivery     x 3  . Angio-edema   . HTN (hypertension)   . GERD (gastroesophageal reflux disease)   . Acne   . Anxiety   . Contact lens/glasses fitting     wears contacts or glasses  . Tear of medial meniscus of left knee   . Tear of medial meniscus of right knee   . Iron deficiency anemia secondary to blood loss (chronic)    . Migraine with aura, without mention of intractable migraine without mention of status migrainosus   . Acute bronchitis   . Pain in thoracic spine   . Pharyngitis   . Dysmenorrhea   . Anxiety state, unspecified   . 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 Cervicalgia   . Obesity   . History of stress test     a. Lexiscan 08/25/13: no sig ischemia, attenuation artifact noted, no sig WMA noted, EF 69%, no EKG changes concerning for ischemia  . History of cardiac cath     a. 04/29/2014: LM nl, LAD no dz, D1 no dz, D2 very small vessel, D3 very small vessel, LCx nl, OM1 very small vessel, OM2 medium sized vessel, OM 3 no dz, RCA no dz, PDA no dz     Social History  Substance Use Topics  . Smoking status: Never Smoker   . Smokeless tobacco: Never Used  . Alcohol Use: No     Current outpatient prescriptions:  .  ALPRAZolam (XANAX) 0.5 MG tablet, Take 0.5 mg by mouth 2 (two) times daily as needed. , Disp: , Rfl:  .  aspirin EC 81 MG tablet, Take 1 tablet (81 mg total) by mouth daily., Disp: 90 tablet, Rfl: 3 .  Dextromethorphan-Guaifenesin (DELSYM COUGH/CHEST CONGEST DM) 5-100 MG/5ML LIQD, Take 2 Doses by mouth  2 (two) times daily., Disp: 1 Bottle, Rfl: 0 .  esomeprazole (NEXIUM) 40 MG capsule, Take 1 capsule (40 mg total) by mouth daily before breakfast., Disp: , Rfl:  .  olmesartan-hydrochlorothiazide (BENICAR HCT) 40-25 MG per tablet, Take 1 tablet by mouth daily., Disp: 30 tablet, Rfl: 6 .  phentermine 37.5 MG capsule, Take 1 capsule (37.5 mg total) by mouth every morning., Disp: 30 capsule, Rfl: 2 .  traMADol (ULTRAM) 50 MG tablet, Take 1 tablet (50 mg total) by mouth every 8 (eight) hours as needed., Disp: 30 tablet, Rfl: 0  Allergies  Allergen Reactions  . Chocolate     Hives, itching  . Iohexol     CP and SOB  . Peanut-Containing Drug Products Swelling    cashews    Review of Systems  Constitutional: Negative for fever, chills and weight loss.       Obesity   HENT: Negative for congestion, hearing loss, sore throat and tinnitus.   Eyes: Negative for blurred vision, double vision and redness.  Respiratory: Negative for cough, hemoptysis and shortness of breath.   Cardiovascular: Positive for chest pain (improved). Negative for palpitations, orthopnea, claudication and leg swelling.  Gastrointestinal: Negative for heartburn, nausea, vomiting, diarrhea, constipation and blood in stool.  Genitourinary: Negative for dysuria, urgency, frequency and hematuria.  Musculoskeletal: Negative for myalgias, back pain, joint pain, falls and neck pain.  Skin: Negative for itching.  Neurological: Positive for dizziness (probable hypoglycemic episodes). Negative for tingling, tremors, focal weakness, seizures, loss of consciousness, weakness and headaches.  Endo/Heme/Allergies: Does not bruise/bleed easily.  Psychiatric/Behavioral: Negative for depression and substance abuse. The patient is nervous/anxious. The patient does not have insomnia.      Objective  Filed Vitals:   04/20/15 0743  BP: 112/64  Pulse: 106  Temp: 98.3 F (36.8 C)  TempSrc: Oral  Resp: 18  Weight: 204 lb 11.2 oz (92.851 kg)  SpO2: 98%     Physical Exam  Constitutional: She is oriented to person, place, and time.  Modestly obese in no acute distress  HENT:  Head: Normocephalic.  Eyes: EOM are normal. Pupils are equal, round, and reactive to light.  Neck: Normal range of motion. No thyromegaly present.  Cardiovascular: Normal rate, regular rhythm and normal heart sounds.   No murmur heard. Pulmonary/Chest: Effort normal and breath sounds normal.  Abdominal: Bowel sounds are normal.  Musculoskeletal: Normal range of motion. She exhibits no edema.  Neurological: She is alert and oriented to person, place, and time. No cranial nerve deficit. Gait normal.  Skin: Skin is warm and dry. No rash noted.  Psychiatric: Memory and affect normal.      Assessment & Plan  1.  Hypoglycemia 3 hour glucose tolerance test and handout for hypoglycemia - POCT Glucose (CBG) - POCT HgB A1C  2. Essential hypertension Well-controlled  3. Obesity Renew phentermine worth renewed commitment to diet and exercise - phentermine 37.5 MG capsule; Take 1 capsule (37.5 mg total) by mouth every morning.  Dispense: 30 capsule; Refill: 2  4. Need for influenza vaccination Given today - Flu Vaccine QUAD 36+ mos PF IM (Fluarix & Fluzone Quad PF)

## 2015-04-20 NOTE — Patient Instructions (Signed)
Hypoglycemia °Hypoglycemia occurs when the glucose in your blood is too low. Glucose is a type of sugar that is your body's main energy source. Hormones, such as insulin and glucagon, control the level of glucose in the blood. Insulin lowers blood glucose and glucagon increases blood glucose. Having too much insulin in your blood stream, or not eating enough food containing sugar, can result in hypoglycemia. Hypoglycemia can happen to people with or without diabetes. It can develop quickly and can be a medical emergency.  °CAUSES  °· Missing or delaying meals. °· Not eating enough carbohydrates at meals. °· Taking too much diabetes medicine. °· Not timing your oral diabetes medicine or insulin doses with meals, snacks, and exercise. °· Nausea and vomiting. °· Certain medicines. °· Severe illnesses, such as hepatitis, kidney disorders, and certain eating disorders. °· Increased activity or exercise without eating something extra or adjusting medicines. °· Drinking too much alcohol. °· A nerve disorder that affects body functions like your heart rate, blood pressure, and digestion (autonomic neuropathy). °· A condition where the stomach muscles do not function properly (gastroparesis). Therefore, medicines and food may not absorb properly. °· Rarely, a tumor of the pancreas can produce too much insulin. °SYMPTOMS  °· Hunger. °· Sweating (diaphoresis). °· Change in body temperature. °· Shakiness. °· Headache. °· Anxiety. °· Lightheadedness. °· Irritability. °· Difficulty concentrating. °· Dry mouth. °· Tingling or numbness in the hands or feet. °· Restless sleep or sleep disturbances. °· Altered speech and coordination. °· Change in mental status. °· Seizures or prolonged convulsions. °· Combativeness. °· Drowsiness (lethargic). °· Weakness. °· Increased heart rate or palpitations. °· Confusion. °· Pale, gray skin color. °· Blurred or double vision. °· Fainting. °DIAGNOSIS  °A physical exam and medical history will be  performed. Your caregiver may make a diagnosis based on your symptoms. Blood tests and other lab tests may be performed to confirm a diagnosis. Once the diagnosis is made, your caregiver will see if your signs and symptoms go away once your blood glucose is raised.  °TREATMENT  °Usually, you can easily treat your hypoglycemia when you notice symptoms. °· Check your blood glucose. If it is less than 70 mg/dl, take one of the following:   °¨ 3-4 glucose tablets.   °¨ ½ cup juice.   °¨ ½ cup regular soda.   °¨ 1 cup skim milk.   °¨ ½-1 tube of glucose gel.   °¨ 5-6 hard candies.   °· Avoid high-fat drinks or food that may delay a rise in blood glucose levels. °· Do not take more than the recommended amount of sugary foods, drinks, gel, or tablets. Doing so will cause your blood glucose to go too high.   °· Wait 10-15 minutes and recheck your blood glucose. If it is still less than 70 mg/dl or below your target range, repeat treatment.   °· Eat a snack if it is more than 1 hour until your next meal.   °There may be a time when your blood glucose may go so low that you are unable to treat yourself at home when you start to notice symptoms. You may need someone to help you. You may even faint or be unable to swallow. If you cannot treat yourself, someone will need to bring you to the hospital.  °HOME CARE INSTRUCTIONS °· If you have diabetes, follow your diabetes management plan by: °¨ Taking your medicines as directed. °¨ Following your exercise plan. °¨ Following your meal plan. Do not skip meals. Eat on time. °¨ Testing your blood   glucose regularly. Check your blood glucose before and after exercise. If you exercise longer or different than usual, be sure to check blood glucose more frequently. °¨ Wearing your medical alert jewelry that says you have diabetes. °· Identify the cause of your hypoglycemia. Then, develop ways to prevent the recurrence of hypoglycemia. °· Do not take a hot bath or shower right after an  insulin shot. °· Always carry treatment with you. Glucose tablets are the easiest to carry. °· If you are going to drink alcohol, drink it only with meals. °· Tell friends or family members ways to keep you safe during a seizure. This may include removing hard or sharp objects from the area or turning you on your side. °· Maintain a healthy weight. °SEEK MEDICAL CARE IF:  °· You are having problems keeping your blood glucose in your target range. °· You are having frequent episodes of hypoglycemia. °· You feel you might be having side effects from your medicines. °· You are not sure why your blood glucose is dropping so low. °· You notice a change in vision or a new problem with your vision. °SEEK IMMEDIATE MEDICAL CARE IF:  °· Confusion develops. °· A change in mental status occurs. °· The inability to swallow develops. °· Fainting occurs. °Document Released: 07/09/2005 Document Revised: 07/14/2013 Document Reviewed: 11/05/2011 °ExitCare® Patient Information ©2015 [employer]. This information is not intended to replace advice given to you by your health care provider. Make sure you discuss any questions you have with your health care provider. ° °

## 2015-04-22 ENCOUNTER — Emergency Department: Payer: 59

## 2015-04-22 ENCOUNTER — Telehealth: Payer: Self-pay | Admitting: Family Medicine

## 2015-04-22 ENCOUNTER — Emergency Department
Admission: EM | Admit: 2015-04-22 | Discharge: 2015-04-23 | Disposition: A | Discharge status: Home / Self Care | Payer: 59 | Attending: Student | Admitting: Student

## 2015-04-22 ENCOUNTER — Encounter: Payer: Self-pay | Admitting: *Deleted

## 2015-04-22 DIAGNOSIS — R000 Tachycardia, unspecified: Secondary | ICD-10-CM | POA: Diagnosis not present

## 2015-04-22 DIAGNOSIS — M545 Low back pain: Secondary | ICD-10-CM

## 2015-04-22 DIAGNOSIS — R109 Unspecified abdominal pain: Secondary | ICD-10-CM | POA: Insufficient documentation

## 2015-04-22 DIAGNOSIS — I10 Essential (primary) hypertension: Secondary | ICD-10-CM | POA: Diagnosis not present

## 2015-04-22 DIAGNOSIS — M5442 Lumbago with sciatica, left side: Secondary | ICD-10-CM | POA: Diagnosis not present

## 2015-04-22 DIAGNOSIS — R52 Pain, unspecified: Secondary | ICD-10-CM

## 2015-04-22 DIAGNOSIS — Z79899 Other long term (current) drug therapy: Secondary | ICD-10-CM | POA: Diagnosis not present

## 2015-04-22 DIAGNOSIS — Z7982 Long term (current) use of aspirin: Secondary | ICD-10-CM | POA: Diagnosis not present

## 2015-04-22 LAB — URINALYSIS COMPLETE WITH MICROSCOPIC (ARMC ONLY)
BILIRUBIN URINE: NEGATIVE
GLUCOSE, UA: NEGATIVE mg/dL
HGB URINE DIPSTICK: NEGATIVE
KETONES UR: NEGATIVE mg/dL
LEUKOCYTES UA: NEGATIVE
NITRITE: NEGATIVE
Protein, ur: NEGATIVE mg/dL
SPECIFIC GRAVITY, URINE: 1.012 (ref 1.005–1.030)
pH: 6 (ref 5.0–8.0)

## 2015-04-22 LAB — GLUCOSE, CAPILLARY: GLUCOSE-CAPILLARY: 73 mg/dL (ref 65–99)

## 2015-04-22 LAB — COMPREHENSIVE METABOLIC PANEL
ALBUMIN: 4.3 g/dL (ref 3.5–5.0)
ALT: 19 U/L (ref 14–54)
ANION GAP: 9 (ref 5–15)
AST: 27 U/L (ref 15–41)
Alkaline Phosphatase: 60 U/L (ref 38–126)
BILIRUBIN TOTAL: 0.7 mg/dL (ref 0.3–1.2)
BUN: 14 mg/dL (ref 6–20)
CO2: 24 mmol/L (ref 22–32)
Calcium: 9.6 mg/dL (ref 8.9–10.3)
Chloride: 103 mmol/L (ref 101–111)
Creatinine, Ser: 1.07 mg/dL — ABNORMAL HIGH (ref 0.44–1.00)
GFR calc Af Amer: >60 mL/min (ref >60)
GLUCOSE: 81 mg/dL (ref 65–99)
POTASSIUM: 3.3 mmol/L — AB (ref 3.5–5.1)
Sodium: 136 mmol/L (ref 135–145)
TOTAL PROTEIN: 7.9 g/dL (ref 6.5–8.1)

## 2015-04-22 LAB — CBC WITH DIFFERENTIAL/PLATELET
BASOS PCT: 1 %
Basophils Absolute: 0.1 10*3/uL (ref 0–0.1)
Eosinophils Absolute: 0.1 10*3/uL (ref 0–0.7)
Eosinophils Relative: 1 %
HEMATOCRIT: 37.4 % (ref 35.0–47.0)
Hemoglobin: 12.7 g/dL (ref 12.0–16.0)
Lymphocytes Relative: 39 %
Lymphs Abs: 3.1 10*3/uL (ref 1.0–3.6)
MCH: 28.2 pg (ref 26.0–34.0)
MCHC: 34 g/dL (ref 32.0–36.0)
MCV: 82.9 fL (ref 80.0–100.0)
MONO ABS: 0.6 10*3/uL (ref 0.2–0.9)
MONOS PCT: 7 %
Neutro Abs: 4.2 10*3/uL (ref 1.4–6.5)
Neutrophils Relative %: 52 %
Platelets: 336 10*3/uL (ref 150–440)
RBC: 4.51 MIL/uL (ref 3.80–5.20)
RDW: 14.6 % — AB (ref 11.5–14.5)
WBC: 7.9 10*3/uL (ref 3.6–11.0)

## 2015-04-22 LAB — LIPASE, BLOOD: LIPASE: 35 U/L (ref 22–51)

## 2015-04-22 MED ORDER — MORPHINE SULFATE (PF) 4 MG/ML IV SOLN
4.0000 mg | Freq: Once | INTRAVENOUS | Status: AC
  Administered 2015-04-22: 4 mg via INTRAVENOUS
  Filled 2015-04-22: qty 1

## 2015-04-22 MED ORDER — KETOROLAC TROMETHAMINE 30 MG/ML IJ SOLN
30.0000 mg | Freq: Once | INTRAMUSCULAR | Status: AC
  Administered 2015-04-22: 30 mg via INTRAVENOUS
  Filled 2015-04-22: qty 1

## 2015-04-22 MED ORDER — SODIUM CHLORIDE 0.9 % IV BOLUS (SEPSIS)
1000.0000 mL | Freq: Once | INTRAVENOUS | Status: AC
Start: 2015-04-22 — End: 2015-04-22
  Administered 2015-04-22: 1000 mL via INTRAVENOUS

## 2015-04-22 MED ORDER — OXYCODONE HCL 5 MG PO TABS
10.0000 mg | ORAL_TABLET | Freq: Once | ORAL | Status: AC
  Administered 2015-04-22: 10 mg via ORAL
  Filled 2015-04-22: qty 2

## 2015-04-22 MED ORDER — LORAZEPAM 2 MG/ML IJ SOLN
0.5000 mg | Freq: Once | INTRAMUSCULAR | Status: AC
Start: 2015-04-22 — End: 2015-04-22
  Administered 2015-04-22: 0.5 mg via INTRAVENOUS
  Filled 2015-04-22: qty 1

## 2015-04-22 MED ORDER — ONDANSETRON HCL 4 MG/2ML IJ SOLN
4.0000 mg | Freq: Once | INTRAMUSCULAR | Status: AC
  Administered 2015-04-22: 4 mg via INTRAVENOUS
  Filled 2015-04-22: qty 2

## 2015-04-22 NOTE — ED Notes (Signed)
Bladder scan 644ml

## 2015-04-22 NOTE — ED Provider Notes (Signed)
[facility] Emergency Department Provider Note  ____________________________________________  Time seen: Approximately 6:06 PM  I have reviewed the triage vital signs and the nursing notes.   HISTORY  Chief Complaint Abdominal Pain    HPI SOWMYA PARTRIDGE is a 44 y.o. female with history of hypertension, GERD, status post hysterectomy who presents for evaluation of 2 days, gradual onset left flank pain, stabbing in nature, constant. Currently her pain is severe. No nausea, vomiting, diarrhea, fevers or chills. She has not urinated today. No chest pain or difficulty breathing. No modifying factors. No hematuria or dysuria. She has never had this kind of pain before.   Past Medical History  Diagnosis Date  . Vaginal delivery     x 3  . Angio-edema   . HTN (hypertension)   . GERD (gastroesophageal reflux disease)   . Acne   . Anxiety   . Contact lens/glasses fitting     wears contacts or glasses  . Tear of medial meniscus of left knee   . Tear of medial meniscus of right knee   . Iron deficiency anemia secondary to blood loss (chronic)   . Migraine with aura, without mention of intractable migraine without mention of status migrainosus   . Acute bronchitis   . Pain in thoracic spine   . Pharyngitis   . Dysmenorrhea   . Anxiety state, unspecified   . 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 Cervicalgia   . Obesity   . History of stress test     a. Lexiscan 08/25/13: no sig ischemia, attenuation artifact noted, no sig WMA noted, EF 69%, no EKG changes concerning for ischemia  . History of cardiac cath     a. 04/29/2014: LM nl, LAD no dz, D1 no dz, D2 very small vessel, D3 very small vessel, LCx nl, OM1 very small vessel, OM2 medium sized vessel, OM 3 no dz, RCA no dz, PDA no dz     Patient Active Problem List   Diagnosis Date Noted  . Tracheobronchitis 03/10/2015  . Serous otitis media 03/10/2015  . Subacute maxillary sinusitis 03/10/2015  .  Atypical chest pain 08/22/2013  . Essential hypertension 08/22/2013  . Tear of medial meniscus of left knee   . Tear of medial meniscus of right knee   . Cough 01/05/2013  . Angio-edema   . HTN (hypertension)   . GERD (gastroesophageal reflux disease)   . Migraine   . Acne   . Anxiety   . Allergic rhinitis 10/05/2011  . Insomnia 08/30/2011  . Obesity 08/30/2011    Past Surgical History  Procedure Laterality Date  . Ovarian cyst removal  04-25-10  . Abdominal hysterectomy  04-25-10    Due to abnormal PAP  . Knee arthroscopy with medial menisectomy Right 06/10/2013    Procedure: RIGHT KNEE ARTHROSCOPY WITH MEDIAL MENISECTOMY;  Surgeon: Lorn Junes, MD;  Location: [facility];  Service: Orthopedics;  Laterality: Right;  partial lateral menisectomoy and chondroplasty  . Knee arthroscopy Left 06/10/2013    Procedure: LEFT KNEE ARTHROSCOPY KNEE WITH PARTIAL MEDIAL MENISCECTOMY ;  Surgeon: Lorn Junes, MD;  Location: [facility];  Service: Orthopedics;  Laterality: Left;  xerofoam, 4x4's, webril, ace wrap, ice wrap  . Dilation and curettage of uterus    . Cardiac catheterization  04/2014    [facility]    Current Outpatient Rx  Name  Route  Sig  Dispense  Refill  . ALPRAZolam (XANAX) 0.5 MG  tablet   Oral   Take 0.5 mg by mouth 2 (two) times daily as needed.          Marland Kitchen aspirin EC 81 MG tablet   Oral   Take 1 tablet (81 mg total) by mouth daily.   90 tablet   3   . Dextromethorphan-Guaifenesin (DELSYM COUGH/CHEST CONGEST DM) 5-100 MG/5ML LIQD   Oral   Take 2 Doses by mouth 2 (two) times daily.   1 Bottle   0   . esomeprazole (NEXIUM) 40 MG capsule   Oral   Take 1 capsule (40 mg total) by mouth daily before breakfast.         . olmesartan-hydrochlorothiazide (BENICAR HCT) 40-25 MG per tablet   Oral   Take 1 tablet by mouth daily.   30 tablet   6   . phentermine 37.5 MG capsule   Oral   Take 1 capsule (37.5 mg total) by mouth every  morning.   30 capsule   2   . traMADol (ULTRAM) 50 MG tablet   Oral   Take 1 tablet (50 mg total) by mouth every 8 (eight) hours as needed.   30 tablet   0     Allergies Chocolate; Iohexol; and Peanut-containing drug products  Family History  Problem Relation Age of Onset  . Early death Mother     Murdered when pt was 53 years old  . Diabetes Mother   . Hypertension Mother   . Gout Mother   . Kidney failure Mother   . Early death Sister 77    due to hemorage  . Alcohol abuse Maternal Uncle   . Alcohol abuse Other   . Arthritis Other   . Heart disease Other   . Other Other     cousin- phlebitis  . Diabetes Maternal Aunt   . Hyperlipidemia Maternal Aunt   . Hypertension Maternal Aunt   . Prostate cancer Maternal Uncle     Social History Social History  Substance Use Topics  . Smoking status: Never Smoker   . Smokeless tobacco: Never Used  . Alcohol Use: No    Review of Systems Constitutional: No fever/chills Eyes: No visual changes. ENT: No sore throat. Cardiovascular: Denies chest pain. Respiratory: Denies shortness of breath. Gastrointestinal: No abdominal pain.  No nausea, no vomiting.  No diarrhea.  No constipation. Genitourinary: Negative for dysuria. Musculoskeletal: Positive for left flank pain. Skin: Negative for rash. Neurological: Negative for headaches, focal weakness or numbness.  10-point ROS otherwise negative.  ____________________________________________   PHYSICAL EXAM:  VITAL SIGNS: ED Triage Vitals  Enc Vitals Group     BP 04/22/15 1738 120/64 mmHg     Pulse Rate 04/22/15 1738 109     Resp 04/22/15 1738 16     Temp 04/22/15 1738 98.4 F (36.9 C)     Temp Source 04/22/15 1738 Oral     SpO2 04/22/15 1738 100 %     Weight --      Height --      Head Cir --      Peak Flow --      Pain Score 04/22/15 1735 10     Pain Loc --      Pain Edu? --      Excl. in [place]? --     Constitutional: Alert and oriented. In mild distress  secondary to pain. Eyes: Conjunctivae are normal. PERRL. EOMI. Head: Atraumatic. Nose: No congestion/rhinnorhea. Mouth/Throat: Mucous membranes are moist.  Oropharynx non-erythematous.  Neck: No stridor.  Cardiovascular: mildly tachycardic rate, regular rhythm. Grossly normal heart sounds.  Good peripheral circulation. Respiratory: Normal respiratory effort.  No retractions. Lungs CTAB. Gastrointestinal: Soft with moderate tenderness to palpation in the left abdomen. + left CVA tenderness. Genitourinary: deferred Musculoskeletal: No lower extremity tenderness nor edema.  No joint effusions. Neurologic:  Normal speech and language. No gross focal neurologic deficits are appreciated. 5 out of 5 strength in bilateral upper and lower extremities. Normal dorsiflexion of the hallux toes bilaterally. Skin:  Skin is warm, dry and intact. No rash noted. Psychiatric: Mood and affect are normal. Speech and behavior are normal.  ____________________________________________   LABS (all labs ordered are listed, but only abnormal results are displayed)  Labs Reviewed  CBC WITH DIFFERENTIAL/PLATELET - Abnormal; Notable for the following:    RDW 14.6 (*)    All other components within normal limits  COMPREHENSIVE METABOLIC PANEL - Abnormal; Notable for the following:    Potassium 3.3 (*)    Creatinine, Ser 1.07 (*)    All other components within normal limits  GLUCOSE, CAPILLARY  LIPASE, BLOOD  URINALYSIS COMPLETEWITH MICROSCOPIC ([facility] ONLY)   ____________________________________________  EKG  none ____________________________________________  RADIOLOGY  CT abdomen and pelvis  FINDINGS: LUNG BASES: Included view of the lung bases are clear. The visualized heart and pericardium are unremarkable. Mild gaseous distention of distal esophagus can be seen with reflux.  KIDNEYS/BLADDER: Kidneys are orthotopic, demonstrating normal size and morphology. No nephrolithiasis, hydronephrosis;  limited assessment for renal masses on this nonenhanced examination. 2.4 cm LEFT upper pole cyst. The unopacified ureters are normal in course and caliber. Urinary bladder is partially distended and unremarkable.  SOLID ORGANS: The liver, spleen, gallbladder, pancreas and adrenal glands are unremarkable for this non-contrast examination.  GASTROINTESTINAL TRACT: The stomach, small and large bowel are normal in course and caliber without inflammatory changes. Normal appendix.  PERITONEUM/RETROPERITONEUM: Aortoiliac vessels are normal in course and caliber. No lymphadenopathy by CT size criteria. Status post hysterectomy. Inflammatory changes of the root of mesentery with small lymph nodes consistent with "misty" mesentery. No intraperitoneal free fluid nor free air.  SOFT TISSUES/ OSSEOUS STRUCTURES: Nonsuspicious. Mild anterior abdominal wall ligamentous laxity. Small fat containing umbilical hernia. 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  IMPRESSION: No urolithiasis or obstructive uropathy.  Mild "misty" mesentery is likely reactive.  ____________________________________________   PROCEDURES  Procedure(s) performed: None  Critical Care performed: No  ____________________________________________   INITIAL IMPRESSION / ASSESSMENT AND PLAN / ED COURSE  Pertinent labs & imaging results that were available during my care of the patient were reviewed by me and considered in my medical decision making (see chart for details).  NAKENYA THEALL is a 45 y.o. female with history of hypertension, GERD, status post hysterectomy who presents for evaluation of 2 days, gradual onset left flank pain, stabbing in nature, constant. On exam, she is in mild distress second to pain. She does have tenderness to palpation throughout the left abdomen, flank. She is mildly tachycardic which I suspect is secondary to pain. We'll treat her pain, give IV fluids, obtain basic labs and likely CT  of the abdomen and pelvis for possible kidney stone.   ----------------------------------------- 11:26 PM on 04/22/2015 -----------------------------------------  Labs reviewed. Normal CBC. CMP with very mild hypokalemia. Very mild elevation of creatinine 1.07. Normal lipase. Urinalysis shows many bacteria but no leuks, nitrites of WBCs. CT of the abdomen and pelvis is unrevealing for a cause of her pain. At this time she appears  to be having significant pain still. She also has a significant amount of tenderness throughout the muscles on the left flank. This may represent musclo skeletal pain/lumbosacral strain however given that her postvoid residual volume was 78 mld and her complaint that she was not able to  urinate today, we'll obtain MRI L spine to rule out cauda equina. If mri unremarkable, would consider treatment for possible UTI though cultures are pending. Care transferred to Dr. Dahlia Client at this time. ____________________________________________   FINAL CLINICAL IMPRESSION(S) / ED DIAGNOSES  Final diagnoses:  Acute left flank pain      Joanne Gavel, MD 04/23/15 0021

## 2015-04-22 NOTE — ED Notes (Signed)
MD Edd Fabian at bedside, updating pt on CT results and plan of care.

## 2015-04-22 NOTE — Telephone Encounter (Signed)
Patient is wanting an order to have a glucose tolerance test done.

## 2015-04-22 NOTE — ED Notes (Signed)
Pt reports she has not urinated in 24 hours. Pain to left flank, severe. No hx of kidney stones.

## 2015-04-22 NOTE — ED Notes (Signed)
Pt says left side abd pain.  Went to pcp, but pain was not bad then.  Now patient is having severe pain.

## 2015-04-23 LAB — GLUCOSE, CAPILLARY: GLUCOSE-CAPILLARY: 100 mg/dL — AB (ref 65–99)

## 2015-04-23 MED ORDER — CEPHALEXIN 500 MG PO CAPS: 500.0000 mg | ORAL_CAPSULE | Freq: Four times a day (QID) | ORAL | Status: AC

## 2015-04-23 NOTE — ED Notes (Signed)
Patient with no complaints at this time. Respirations even and unlabored. Skin warm/dry. Discharge instructions reviewed with patient at this time. Patient given opportunity to voice concerns/ask questions. IV removed per policy and band-aid applied to site. Patient discharged at this time and left Emergency Department, via wheelchair.   

## 2015-04-23 NOTE — ED Notes (Signed)
Pt back from MRI 

## 2015-04-23 NOTE — ED Provider Notes (Signed)
-----------------------------------------   1:16 AM on 04/23/2015 -----------------------------------------   Blood pressure 113/81, pulse 88, temperature 98.4 F (36.9 C), temperature source Oral, resp. rate 18, SpO2 100 %.  Assuming care from Dr. Edd Fabian.  In short, Marie Vaughan is a 44 y.o. female with a chief complaint of Abdominal Pain .  Refer to the original H&P for additional details.  The current plan of care is to follow up the results of the MRI  MRI: No acute lumbar spine fracture or malalignment, no neuro compressive changes. Tiny right central L1/2 disc protrusion  The patient's MRI is unremarkable. I will discharge the patient home with Keflex for her urinary tract infection. I'll have the patient follow-up with her primary care physician.   Loney Hering, MD 04/23/15 [phone]

## 2015-04-23 NOTE — Discharge Instructions (Signed)
Flank Pain °Flank pain refers to pain that is located on the side of the body between the upper abdomen and the back. The pain may occur over a short period of time (acute) or may be long-term or reoccurring (chronic). It may be mild or severe. Flank pain can be caused by many things. °CAUSES  °Some of the more common causes of flank pain include: °· Muscle strains.   °· Muscle spasms.   °· A disease of your spine (vertebral disk disease).   °· A lung infection (pneumonia).   °· Fluid around your lungs (pulmonary edema).   °· A kidney infection.   °· Kidney stones.   °· A very painful skin rash caused by the chickenpox virus (shingles).   °· Gallbladder disease.   °HOME CARE INSTRUCTIONS  °Home care will depend on the cause of your pain. In general, °· Rest as directed by your caregiver. °· Drink enough fluids to keep your urine clear or pale yellow. °· Only take over-the-counter or prescription medicines as directed by your caregiver. Some medicines may help relieve the pain. °· Tell your caregiver about any changes in your pain. °· Follow up with your caregiver as directed. °SEEK IMMEDIATE MEDICAL CARE IF:  °· Your pain is not controlled with medicine.   °· You have new or worsening symptoms. °· Your pain increases.   °· You have abdominal pain.   °· You have shortness of breath.   °· You have persistent nausea or vomiting.   °· You have swelling in your abdomen.   °· You feel faint or pass out.   °· You have blood in your urine. °· You have a fever or persistent symptoms for more than 2-3 days. °· You have a fever and your symptoms suddenly get worse. °MAKE SURE YOU:  °· Understand these instructions. °· Will watch your condition. °· Will get help right away if you are not doing well or get worse. °Document Released: 08/30/2005 Document Revised: 04/02/2012 Document Reviewed: 02/21/2012 °Exit[employer]® Patient Information ©2015 [employer]. This information is not intended to replace advice given to you by your  health care provider. Make sure you discuss any questions you have with your health care provider. ° °

## 2015-04-23 NOTE — ED Notes (Signed)
Pt transported to MRI by ED Tech.

## 2015-04-25 ENCOUNTER — Telehealth: Payer: Self-pay | Admitting: Family Medicine

## 2015-04-25 LAB — URINE CULTURE

## 2015-04-25 NOTE — Telephone Encounter (Signed)
Spoke to Dr. Rutherford Nail regarding message. He will go over all results from ER and contact patient

## 2015-04-25 NOTE — Telephone Encounter (Signed)
Went to ER on Friday. Her sugar had dropped, sweating badly, shaking, headache, blurred vision, left side pain, had not urinated all day. Still having side pain leading towards her back and it feel like its burning. no burning or itching when urinating in the vaginal area. They did a CT and XRAY and they say she has UTI. Please look at the testing and notes

## 2015-04-26 ENCOUNTER — Other Ambulatory Visit: Payer: Self-pay | Admitting: Emergency Medicine

## 2015-04-26 DIAGNOSIS — E162 Hypoglycemia, unspecified: Secondary | ICD-10-CM

## 2015-04-26 MED ORDER — CYCLOBENZAPRINE HCL 5 MG PO TABS: 5.0000 mg | ORAL_TABLET | Freq: Two times a day (BID) | ORAL | Status: DC | PRN

## 2015-04-27 ENCOUNTER — Emergency Department
Admission: EM | Admit: 2015-04-27 | Discharge: 2015-04-27 | Disposition: A | Discharge status: Home / Self Care | Payer: 59

## 2015-04-27 ENCOUNTER — Other Ambulatory Visit
Admission: RE | Admit: 2015-04-27 | Discharge: 2015-04-27 | Disposition: A | Discharge status: Home / Self Care | Payer: 59 | Source: Ambulatory Visit | Attending: Family Medicine | Admitting: Family Medicine

## 2015-04-27 DIAGNOSIS — E162 Hypoglycemia, unspecified: Secondary | ICD-10-CM | POA: Insufficient documentation

## 2015-04-27 LAB — GLUCOSE, 2 HOUR: Glucose, 2 hour: 114 mg/dL (ref 70–139)

## 2015-04-27 LAB — GLUCOSE, FASTING: Glucose, Fasting: 101 mg/dL — ABNORMAL HIGH (ref 65–99)

## 2015-05-13 ENCOUNTER — Telehealth: Payer: Self-pay | Admitting: Family Medicine

## 2015-05-13 ENCOUNTER — Other Ambulatory Visit: Payer: Self-pay | Admitting: Family Medicine

## 2015-05-13 DIAGNOSIS — R739 Hyperglycemia, unspecified: Secondary | ICD-10-CM

## 2015-05-13 DIAGNOSIS — M5136 Other intervertebral disc degeneration, lumbar region: Secondary | ICD-10-CM

## 2015-05-13 DIAGNOSIS — M5126 Other intervertebral disc displacement, lumbar region: Secondary | ICD-10-CM

## 2015-05-13 DIAGNOSIS — M5440 Lumbago with sciatica, unspecified side: Secondary | ICD-10-CM

## 2015-05-13 MED ORDER — HYDROCODONE-ACETAMINOPHEN 10-325 MG PO TABS: 1.0000 | ORAL_TABLET | Freq: Three times a day (TID) | ORAL | Status: DC | PRN

## 2015-05-13 MED ORDER — OXYCODONE-ACETAMINOPHEN 5-325 MG PO TABS: 1.0000 | ORAL_TABLET | Freq: Three times a day (TID) | ORAL | Status: DC | PRN

## 2015-05-13 NOTE — Telephone Encounter (Signed)
Patient is having severe back pain due to bulging disc and the tramadol is not touching it. Is it possible to call her something in stronger. Also her sugar levels keep dropping and she is needing advice. Please return call to discuss this

## 2015-05-13 NOTE — Telephone Encounter (Signed)
Pt stated that she was told that a referral had been placed for ortho for her bulging disc in her back which had not been done and that she had never been referred to an endocrinologist either. Placed both referrals.

## 2015-05-13 NOTE — Telephone Encounter (Signed)
Referred to endocrinologist for blood sugar issues  Prescription for Vicodin is written for back pain

## 2015-05-20 ENCOUNTER — Telehealth: Payer: Self-pay | Admitting: Family Medicine

## 2015-05-20 NOTE — Telephone Encounter (Signed)
Informed pt that Dr.Shah thinks it will be ok to start steroid.

## 2015-05-20 NOTE — Telephone Encounter (Signed)
Since Dr. Rutherford Nail will be out for the next week i am sending this to Dr. Manuella Ghazi for his opinion.

## 2015-05-20 NOTE — Telephone Encounter (Signed)
Patient went to her referral appointment at [facility] (Dr Nehemiah Massed) for her back. He did put her in a back brace and would like to start her on a steroid, but he wanted her to check with her primary doctor to make sure it was okay for her to take the medication due to her having pre dm. Please return patient call

## 2015-05-23 ENCOUNTER — Other Ambulatory Visit: Payer: Self-pay | Admitting: Family Medicine

## 2015-07-12 ENCOUNTER — Telehealth: Payer: Self-pay | Admitting: Family Medicine

## 2015-07-12 MED ORDER — AZITHROMYCIN 250 MG PO TABS: ORAL_TABLET | ORAL | Status: DC

## 2015-07-12 MED ORDER — BENZONATATE 100 MG PO CAPS: 100.0000 mg | ORAL_CAPSULE | Freq: Three times a day (TID) | ORAL | Status: DC

## 2015-07-12 NOTE — Telephone Encounter (Signed)
Patient informed and thanks you °

## 2015-07-12 NOTE — Telephone Encounter (Signed)
Z-Pak and Tessalon Perles 100 mg 1 every 8 hours #30

## 2015-07-12 NOTE — Telephone Encounter (Signed)
I have sent medications to her pharmacy please inform pt,thank you.

## 2015-07-12 NOTE — Telephone Encounter (Signed)
Husband has been sick with congestion, drainage with yellow mucus, coughing mostly at night, headache starts in the morning but she take tylenol to help that. No fever, no rapid heart beat. It hits her at night. Would like something called in to employee pharmacy

## 2015-07-21 ENCOUNTER — Ambulatory Visit: Payer: 59 | Admitting: Family Medicine

## 2015-08-01 ENCOUNTER — Other Ambulatory Visit: Payer: Self-pay | Admitting: Family Medicine

## 2015-08-29 ENCOUNTER — Other Ambulatory Visit: Payer: Self-pay | Admitting: Family Medicine

## 2015-08-29 MED ORDER — OLMESARTAN MEDOXOMIL-HCTZ 40-25 MG PO TABS: 1.0000 | ORAL_TABLET | Freq: Every day | ORAL | Status: DC

## 2015-09-01 ENCOUNTER — Telehealth: Payer: Self-pay | Admitting: Family Medicine

## 2015-09-01 ENCOUNTER — Other Ambulatory Visit: Payer: Self-pay | Admitting: Family Medicine

## 2015-09-01 NOTE — Telephone Encounter (Signed)
Alprazolam has expired and patient would like to know if you will refill it.

## 2015-09-05 MED ORDER — ALPRAZOLAM 0.5 MG PO TABS: 0.5000 mg | ORAL_TABLET | Freq: Two times a day (BID) | ORAL | Status: DC | PRN

## 2015-10-02 ENCOUNTER — Emergency Department
Admission: EM | Admit: 2015-10-02 | Discharge: 2015-10-02 | Disposition: A | Discharge status: Home / Self Care | Payer: 59 | Attending: Emergency Medicine | Admitting: Emergency Medicine

## 2015-10-02 ENCOUNTER — Emergency Department: Payer: 59

## 2015-10-02 DIAGNOSIS — M25512 Pain in left shoulder: Secondary | ICD-10-CM | POA: Diagnosis not present

## 2015-10-02 DIAGNOSIS — Y939 Activity, unspecified: Secondary | ICD-10-CM | POA: Insufficient documentation

## 2015-10-02 DIAGNOSIS — T783XXA Angioneurotic edema, initial encounter: Secondary | ICD-10-CM | POA: Insufficient documentation

## 2015-10-02 DIAGNOSIS — Y92410 Unspecified street and highway as the place of occurrence of the external cause: Secondary | ICD-10-CM | POA: Insufficient documentation

## 2015-10-02 DIAGNOSIS — D500 Iron deficiency anemia secondary to blood loss (chronic): Secondary | ICD-10-CM | POA: Insufficient documentation

## 2015-10-02 DIAGNOSIS — Z79899 Other long term (current) drug therapy: Secondary | ICD-10-CM | POA: Insufficient documentation

## 2015-10-02 DIAGNOSIS — R079 Chest pain, unspecified: Secondary | ICD-10-CM | POA: Diagnosis not present

## 2015-10-02 DIAGNOSIS — I10 Essential (primary) hypertension: Secondary | ICD-10-CM | POA: Insufficient documentation

## 2015-10-02 DIAGNOSIS — K219 Gastro-esophageal reflux disease without esophagitis: Secondary | ICD-10-CM | POA: Diagnosis not present

## 2015-10-02 DIAGNOSIS — Z7982 Long term (current) use of aspirin: Secondary | ICD-10-CM | POA: Diagnosis not present

## 2015-10-02 DIAGNOSIS — E669 Obesity, unspecified: Secondary | ICD-10-CM | POA: Insufficient documentation

## 2015-10-02 DIAGNOSIS — Z792 Long term (current) use of antibiotics: Secondary | ICD-10-CM | POA: Diagnosis not present

## 2015-10-02 DIAGNOSIS — R0789 Other chest pain: Secondary | ICD-10-CM | POA: Insufficient documentation

## 2015-10-02 DIAGNOSIS — Z9889 Other specified postprocedural states: Secondary | ICD-10-CM | POA: Diagnosis not present

## 2015-10-02 DIAGNOSIS — Z791 Long term (current) use of non-steroidal anti-inflammatories (NSAID): Secondary | ICD-10-CM | POA: Diagnosis not present

## 2015-10-02 DIAGNOSIS — Y999 Unspecified external cause status: Secondary | ICD-10-CM | POA: Insufficient documentation

## 2015-10-02 DIAGNOSIS — J209 Acute bronchitis, unspecified: Secondary | ICD-10-CM | POA: Insufficient documentation

## 2015-10-02 DIAGNOSIS — S299XXA Unspecified injury of thorax, initial encounter: Secondary | ICD-10-CM | POA: Diagnosis not present

## 2015-10-02 DIAGNOSIS — G43109 Migraine with aura, not intractable, without status migrainosus: Secondary | ICD-10-CM | POA: Insufficient documentation

## 2015-10-02 DIAGNOSIS — F419 Anxiety disorder, unspecified: Secondary | ICD-10-CM | POA: Insufficient documentation

## 2015-10-02 MED ORDER — IBUPROFEN 600 MG PO TABS: 600.0000 mg | ORAL_TABLET | Freq: Three times a day (TID) | ORAL | Status: DC | PRN

## 2015-10-02 NOTE — ED Notes (Deleted)
PT arrives to ER via POV after fall from tree. Pt fell standing on his feet, felt a "jolt" to right knee and fell over, fell on wood and injured left hip. Pt did not hit head, denies LOC. Ambulatory after fall. Pt took shower, changed closed and was lying on the bed and then began having the severe pain in lower back, right knee and left hip.
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
## 2015-10-11 ENCOUNTER — Telehealth: Payer: Self-pay | Admitting: Family Medicine

## 2015-11-10 ENCOUNTER — Other Ambulatory Visit: Payer: Self-pay | Admitting: Family Medicine

## 2015-11-14 ENCOUNTER — Encounter: Payer: Self-pay | Admitting: Physician Assistant

## 2015-11-14 ENCOUNTER — Ambulatory Visit: Payer: Self-pay | Admitting: Physician Assistant

## 2015-11-14 VITALS — BP 110/90 | HR 88 | Temp 99.1°F

## 2015-11-14 DIAGNOSIS — J069 Acute upper respiratory infection, unspecified: Secondary | ICD-10-CM

## 2015-11-14 DIAGNOSIS — R509 Fever, unspecified: Secondary | ICD-10-CM

## 2015-11-14 LAB — POCT INFLUENZA A/B
Influenza A, POC: NEGATIVE
Influenza B, POC: NEGATIVE

## 2015-11-14 MED ORDER — HYDROCOD POLST-CPM POLST ER 10-8 MG/5ML PO SUER: 5.0000 mL | Freq: Two times a day (BID) | ORAL | Status: DC | PRN

## 2015-11-14 MED ORDER — AZITHROMYCIN 250 MG PO TABS: ORAL_TABLET | ORAL | Status: DC

## 2015-11-14 NOTE — Progress Notes (Signed)
S: C/o runny nose and congestion with dry cough for 4 days, + fever, chills, denies cp/sob, v/d;  cough is sporadic and dry, has headache from cough, some body aches Using otc meds: mucinex  O: PE: vitals w elevated temp, nad,  perrl eomi, normocephalic, tms dull, nasal mucosa red and swollen, throat injected, neck supple no lymph, lungs c t a, cv rrr, neuro intact, flu swab neg  A:  Acute flu like illness   P:zpack, tussionex, drink fluids, continue regular meds , use otc meds of choice, return if not improving in 5 days, return earlier if worsening

## 2015-11-16 ENCOUNTER — Other Ambulatory Visit: Payer: Self-pay | Admitting: Emergency Medicine

## 2015-11-16 ENCOUNTER — Telehealth: Payer: Self-pay | Admitting: Physician Assistant

## 2015-11-16 NOTE — Telephone Encounter (Signed)
Spoke to Catherine about this and she ok'd Therapist, music 200mg  1 po tid prn. Spoke with Caryl Pina at [place] and new rx given. Patient was notified and she stated that the tessalon perls does not work but will try it again and see how she does . I instructed her to take Tessalon perls during day and Tussionex at night as needed for cough.

## 2015-11-22 ENCOUNTER — Other Ambulatory Visit: Payer: Self-pay | Admitting: Family Medicine

## 2015-11-22 DIAGNOSIS — Z1231 Encounter for screening mammogram for malignant neoplasm of breast: Secondary | ICD-10-CM

## 2015-11-30 ENCOUNTER — Encounter: Payer: Self-pay | Admitting: Physician Assistant

## 2015-11-30 ENCOUNTER — Telehealth: Payer: Self-pay | Admitting: Cardiovascular Disease

## 2015-11-30 ENCOUNTER — Ambulatory Visit: Payer: Self-pay | Admitting: Physician Assistant

## 2015-11-30 VITALS — BP 99/70 | HR 107 | Temp 98.0°F

## 2015-11-30 DIAGNOSIS — J069 Acute upper respiratory infection, unspecified: Secondary | ICD-10-CM

## 2015-11-30 DIAGNOSIS — R05 Cough: Secondary | ICD-10-CM

## 2015-11-30 DIAGNOSIS — R059 Cough, unspecified: Secondary | ICD-10-CM

## 2015-11-30 MED ORDER — CEFDINIR 300 MG PO CAPS: 300.0000 mg | ORAL_CAPSULE | Freq: Two times a day (BID) | ORAL | Status: DC

## 2015-11-30 MED ORDER — ALBUTEROL SULFATE HFA 108 (90 BASE) MCG/ACT IN AERS: 2.0000 | INHALATION_SPRAY | Freq: Four times a day (QID) | RESPIRATORY_TRACT | Status: DC | PRN

## 2015-11-30 MED ORDER — METHYLPREDNISOLONE 4 MG PO TBPK: ORAL_TABLET | ORAL | Status: DC

## 2015-11-30 MED ORDER — HYDROCOD POLST-CPM POLST ER 10-8 MG/5ML PO SUER: 5.0000 mL | Freq: Two times a day (BID) | ORAL | Status: DC | PRN

## 2015-11-30 NOTE — Telephone Encounter (Signed)
Pt calling stating   Pt c/o Shortness Of Breath: STAT if SOB developed within the last 24 hours or pt is noticeably SOB on the phone  1. Are you currently SOB (can you hear that pt is SOB on the phone)? No   2. How long have you been experiencing SOB? "forever"  3. Are you SOB when sitting or when up moving around? Anytime   4. Are you currently experiencing any other symptoms? Just BP is running low Denies SOB/CP at the moment Pt is coming 12/06/15 to see Dr Fletcher Anon.

## 2015-11-30 NOTE — Telephone Encounter (Signed)
S/w pt who reports SOB and coughing "for awhile". She could not give me an exact amount of time. States her BP has been running low.  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 We were disconnected. Attempted to call pt back. No answer. Left voice mail for patient to call back.

## 2015-11-30 NOTE — Progress Notes (Signed)
S: c/o continued cough and now has head congestion, had gotten a little better but now cough is worse and head hurts more, mucus is yellow when she can get it out, is having some sob with exertion, having intermittent chest pain, none at this time, denies fever /chills, states her bp has been running low so she stopped her medication but level is still low; was 88/60 this morning, hasn't taken her bp meds in several days , was on benicar hct, had been on phentermine via dr Rutherford Nail but is no longer taking this medication as hasn't been back for refill, states she had a cath done for chest pain in 2015 by Dr Tyrell Antonio office, couldn't find any reason for her chest pain at that time,   O: vitals wnl bp at 99/70 (low for patient), nad,  tms dull, nasal mucosa red/swollen, throat wnl, neck supple no lymph, lungs c t a, cv rrr no mrg, cough is dry and harsh  A: continued cough, uri, sob on exertion with some nonspecific chest pain intermittently  P: omnicef, medrol dose, albuterol inhaler, f/u with dr Fletcher Anon for reeval as cath was done in 2015 and has been on phentermine since that time

## 2015-12-01 ENCOUNTER — Ambulatory Visit
Admission: RE | Admit: 2015-12-01 | Discharge: 2015-12-01 | Disposition: A | Discharge status: Home / Self Care | Payer: 59 | Source: Ambulatory Visit | Attending: Family Medicine | Admitting: Family Medicine

## 2015-12-01 ENCOUNTER — Other Ambulatory Visit: Payer: Self-pay | Admitting: Family Medicine

## 2015-12-01 DIAGNOSIS — Z1231 Encounter for screening mammogram for malignant neoplasm of breast: Secondary | ICD-10-CM | POA: Insufficient documentation

## 2015-12-01 NOTE — Telephone Encounter (Signed)
S/w pt who reports continued SOB and coughing since our last phone conversation.  She went to the employee clinic yesterday. Pt prescribed an inhaler, prednisone, tussinex and abx.  Her BP was low. She takes benicar but reports she has not been taking as prescribed. States she was advised by Manuela Schwartz, employee clinic, to hold benicar at this time.  Confirmed May 16 appt w/Dr. Fletcher Anon.  Pt verbalized understanding and is agreeable w/plan.

## 2015-12-05 ENCOUNTER — Ambulatory Visit (INDEPENDENT_AMBULATORY_CARE_PROVIDER_SITE_OTHER): Payer: 59 | Admitting: Family Medicine

## 2015-12-05 ENCOUNTER — Encounter: Payer: Self-pay | Admitting: Family Medicine

## 2015-12-05 VITALS — BP 106/62 | HR 98 | Temp 98.2°F | Resp 16 | Ht 62.0 in | Wt 212.7 lb

## 2015-12-05 DIAGNOSIS — M25361 Other instability, right knee: Secondary | ICD-10-CM

## 2015-12-05 DIAGNOSIS — E669 Obesity, unspecified: Secondary | ICD-10-CM | POA: Diagnosis not present

## 2015-12-05 DIAGNOSIS — F411 Generalized anxiety disorder: Secondary | ICD-10-CM

## 2015-12-05 DIAGNOSIS — R05 Cough: Secondary | ICD-10-CM

## 2015-12-05 DIAGNOSIS — G4700 Insomnia, unspecified: Secondary | ICD-10-CM

## 2015-12-05 DIAGNOSIS — R059 Cough, unspecified: Secondary | ICD-10-CM

## 2015-12-05 DIAGNOSIS — I10 Essential (primary) hypertension: Secondary | ICD-10-CM | POA: Diagnosis not present

## 2015-12-05 DIAGNOSIS — R42 Dizziness and giddiness: Secondary | ICD-10-CM

## 2015-12-05 DIAGNOSIS — R600 Localized edema: Secondary | ICD-10-CM

## 2015-12-05 DIAGNOSIS — R0789 Other chest pain: Secondary | ICD-10-CM

## 2015-12-05 DIAGNOSIS — J069 Acute upper respiratory infection, unspecified: Secondary | ICD-10-CM

## 2015-12-05 MED ORDER — MEDICAL COMPRESSION STOCKINGS MISC: 2.0000 [IU] | Freq: Every day | Status: DC

## 2015-12-05 NOTE — Progress Notes (Signed)
Name: Marie Vaughan   MRN: [id number]    DOB: 17-Feb-1971   Date:12/05/2015       Progress Note  Subjective  Chief Complaint  Chief Complaint  Patient presents with  . Hypertension    Patient states for awhile she has been dealing with a cough and seeing Employee Health, but before this has been experiencing low BP- 80's/50's, lightheadness, dizzy and edema all over.   . Dizziness    HPI  HTN: she has a long history of HTN, and was taking Benicar/hctz for many years, she states last year she was feeling dizzy and tired and sometimes having nausea since last year. She initially thought it was secondary to hypoglycemia, but her bp has been low when checked during sick visits and also has been low at work. She stopped taking Benicar/HCTZ a few weeks ago and bp is still low. She continues to have chest pain and will see Dr. Fletcher Anon tomorrow.   URI: seen by Ashok Cordia - urgent visit and was given prednisone, antibiotics and cough medication. She does not like taking prednisone, she is taking other medications. Clear rhinorrhea , dry cough, no fever.   Insomnia: she is taking Alprazolam prn for sleep, not every night  GAD: she started taking Alprazolam in 2012 after her daughter had a baby at 10 weeks and lived one day before her grand-daughter died. She states she is feeling fine now. Denies anxiety - " I gave it to the Lord" , but was fidgety during the visit ( picking on her nails ) and also her leg was constantly moving. Worried about her health.   Right knee instability she had arthroscopic surgery with Dr. Noemi Chapel lin 2014 and she states that she is gradually getting worse, with swelling, numbness and instability of right knee. We will refer her back to him  Obesity: gaining weight again. Off Adipex and also states since she stopped Benicar she is swelling more and is gaining weight.     Patient Active Problem List   Diagnosis Date Noted  . Atypical chest pain 08/22/2013  . Essential  hypertension 08/22/2013  . Cough 01/05/2013  . Angio-edema   . GERD (gastroesophageal reflux disease)   . Migraine   . Acne   . Anxiety   . Allergic rhinitis 10/05/2011  . Insomnia 08/30/2011  . Obesity 08/30/2011    Past Surgical History  Procedure Laterality Date  . Ovarian cyst removal  04-25-10  . Abdominal hysterectomy  04-25-10    Due to abnormal PAP  . Knee arthroscopy with medial menisectomy Right 06/10/2013    Procedure: RIGHT KNEE ARTHROSCOPY WITH MEDIAL MENISECTOMY;  Surgeon: Lorn Junes, MD;  Location: [place];  Service: Orthopedics;  Laterality: Right;  partial lateral menisectomoy and chondroplasty  . Knee arthroscopy Left 06/10/2013    Procedure: LEFT KNEE ARTHROSCOPY KNEE WITH PARTIAL MEDIAL MENISCECTOMY ;  Surgeon: Lorn Junes, MD;  Location: [place];  Service: Orthopedics;  Laterality: Left;  xerofoam, 4x4's, webril, ace wrap, ice wrap  . Dilation and curettage of uterus    . Cardiac catheterization  04/2014    [facility]    Family History  Problem Relation Age of Onset  . Early death Mother     Murdered when pt was 1 years old  . Diabetes Mother   . Hypertension Mother   . Gout Mother   . Kidney failure Mother   . Early death Sister 76    due to  hemorage  . Alcohol abuse Maternal Uncle   . Alcohol abuse Other   . Arthritis Other   . Heart disease Other   . Other Other     cousin- phlebitis  . Diabetes Maternal Aunt   . Hyperlipidemia Maternal Aunt   . Hypertension Maternal Aunt   . Prostate cancer Maternal Uncle     Social History   Social History  . Marital Status: Single    Spouse Name: N/A  . Number of Children: 3  . Years of Education: N/A   Occupational History  . Medical records - [employer]    Social History Main Topics  . Smoking status: Never Smoker   . Smokeless tobacco: Never Used  . Alcohol Use: No  . Drug Use: No  . Sexual Activity:    Partners: Male   Other Topics  Concern  . Not on file   Social History Narrative   Lives in [place] with fiance, and 2 children 96 and 16. Works at [employer].      Regular Exercise -  Walk, 30 - 1 hr daily   Daily Caffeine Use:  1 cup coffee AM (in cold weather)              Current outpatient prescriptions:  .  albuterol (PROVENTIL HFA;VENTOLIN HFA) 108 (90 Base) MCG/ACT inhaler, Inhale 2 puffs into the lungs every 6 (six) hours as needed for wheezing or shortness of breath., Disp: 1 Inhaler, Rfl: 0 .  ALPRAZolam (XANAX) 0.5 MG tablet, TAKE 1 TABLET BY MOUTH TWICE DAILY, Disp: 30 tablet, Rfl: 5 .  aspirin EC 81 MG tablet, Take 1 tablet (81 mg total) by mouth daily., Disp: 90 tablet, Rfl: 3 .  cefdinir (OMNICEF) 300 MG capsule, Take 1 capsule (300 mg total) by mouth 2 (two) times daily., Disp: 20 capsule, Rfl: 0 .  chlorpheniramine-HYDROcodone (TUSSIONEX PENNKINETIC ER) 10-8 MG/5ML SUER, Take 5 mLs by mouth every 12 (twelve) hours as needed for cough., Disp: 150 mL, Rfl: 0 .  esomeprazole (NEXIUM) 40 MG capsule, Take 1 capsule (40 mg total) by mouth daily before breakfast., Disp: , Rfl:   Allergies  Allergen Reactions  . Chocolate     Hives, itching  . Iohexol     CP and SOB  . Peanut-Containing Drug Products Swelling    cashews     ROS  Constitutional: Negative for fever, positive for  weight change.  Respiratory: Positive  for cough no  shortness of breath.   Cardiovascular: Positive for chest pain no  palpitations.  Gastrointestinal: Negative for abdominal pain, no bowel changes.  Musculoskeletal: Negative for gait problem or joint swelling.  Skin: Negative for rash.  Neurological: Negative for dizziness or headache.  No other specific complaints in a complete review of systems (except as listed in HPI above).  Objective  Filed Vitals:   12/05/15 1019  BP: 106/62  Pulse: 98  Temp: 98.2 F (36.8 C)  TempSrc: Oral  Resp: 16  Height: 5\' 2"  (1.575 m)  Weight: 212 lb 11.2 oz (96.48 kg)  SpO2: 99%     Body mass index is 38.89 kg/(m^2).  Physical Exam  Constitutional: Patient appears well-developed and well-nourished. Obese  No distress.  HEENT: head atraumatic, normocephalic, pupils equal and reactive to light,  neck supple, throat within normal limits Cardiovascular: Normal rate, regular rhythm and normal heart sounds.  No murmur heard. Trace  BLE edema. Pulmonary/Chest: Effort normal and breath sounds normal. No respiratory distress. Abdominal: Soft.  There is no tenderness. Psychiatric: Patient is fidgety , cried during visit also.  Judgment and thought content normal. Neurological exam: normal today  Recent Results (from the past 2160 hour(s))  POCT Influenza A/B (POC66)     Status: Normal   Collection Time: 11/14/15  2:43 PM  Result Value Ref Range   Influenza A, POC Negative Negative   Influenza B, POC Negative Negative     PHQ2/9: Depression screen [facility] 2/9 12/05/2015 04/20/2015  Decreased Interest 0 0  Down, Depressed, Hopeless 0 0  PHQ - 2 Score 0 0     Fall Risk: Fall Risk  12/05/2015 04/20/2015 03/10/2015 01/18/2015  Falls in the past year? No No No No     Functional Status Survey: Is the patient deaf or have difficulty hearing?: No Does the patient have difficulty seeing, even when wearing glasses/contacts?: No Does the patient have difficulty concentrating, remembering, or making decisions?: No Does the patient have difficulty walking or climbing stairs?: No Does the patient have difficulty dressing or bathing?: No Does the patient have difficulty doing errands alone such as visiting a doctor's office or shopping?: No    Assessment & Plan  1. Essential hypertension  BP is low now, stay off medication  - CBC with Differential/Platelet - Comprehensive metabolic panel  2. Dizziness  - CBC with Differential/Platelet - Comprehensive metabolic panel - TSH  3. Insomnia  Continue alprazolam prn for sleep   4. Atypical chest pain  Keep follow up  with Dr. Fletcher Anon  5. Cough  Recent URI, she states she only has a cough with a URI  6. Upper respiratory infection  Seen by Ashok Cordia PA and is already getting treated  7. Knee instability, right  - Ambulatory referral to Orthopedic Surgery - Dr. Noemi Chapel  8. Obesity  Discussed with the patient the risk posed by an increased BMI. Discussed importance of portion control, calorie counting and at least 150 minutes of physical activity weekly. Avoid sweet beverages and drink more water. Eat at least 6 servings of fruit and vegetables daily . She needs to resume going to the gym  9. Bilateral edema of lower extremity  - Elastic Bandages & Supports (MEDICAL COMPRESSION STOCKINGS) MISC; 2 Units by Does not apply route daily.  Dispense: 2 each; Refill: 0   10. GAD (generalized anxiety disorder)  Explained that her health problems seemed to have started after her grandchild died shortly after birth and she may be still grieving and may also have health anxiety. Discussed medication and she will think about it, cried during visit today

## 2015-12-06 ENCOUNTER — Ambulatory Visit (INDEPENDENT_AMBULATORY_CARE_PROVIDER_SITE_OTHER): Payer: 59 | Admitting: Cardiovascular Disease

## 2015-12-06 ENCOUNTER — Encounter: Payer: Self-pay | Admitting: Cardiovascular Disease

## 2015-12-06 ENCOUNTER — Other Ambulatory Visit: Payer: Self-pay | Admitting: Orthopedic Surgery

## 2015-12-06 ENCOUNTER — Ambulatory Visit
Admission: RE | Admit: 2015-12-06 | Discharge: 2015-12-06 | Disposition: A | Discharge status: Home / Self Care | Payer: 59 | Source: Ambulatory Visit | Attending: Orthopedic Surgery | Admitting: Orthopedic Surgery

## 2015-12-06 VITALS — BP 90/58 | HR 87 | Ht 62.0 in | Wt 216.0 lb

## 2015-12-06 DIAGNOSIS — R52 Pain, unspecified: Secondary | ICD-10-CM

## 2015-12-06 DIAGNOSIS — I10 Essential (primary) hypertension: Secondary | ICD-10-CM | POA: Diagnosis not present

## 2015-12-06 DIAGNOSIS — M170 Bilateral primary osteoarthritis of knee: Secondary | ICD-10-CM | POA: Diagnosis not present

## 2015-12-06 DIAGNOSIS — R0789 Other chest pain: Secondary | ICD-10-CM | POA: Diagnosis not present

## 2015-12-06 DIAGNOSIS — M25561 Pain in right knee: Secondary | ICD-10-CM | POA: Diagnosis not present

## 2015-12-06 DIAGNOSIS — M25562 Pain in left knee: Secondary | ICD-10-CM | POA: Insufficient documentation

## 2015-12-06 NOTE — Patient Instructions (Addendum)
Medication Instructions:  Please continue your current medications  Labwork: None  Testing/Procedures: Your physician has requested that you have an echocardiogram. Echocardiography is a painless test that uses sound waves to create images of your heart. It provides your doctor with information about the size and shape of your heart and how well your heart's chambers and valves are working. This procedure takes approximately one hour. There are no restrictions for this procedure.  Date & time: _________________________  Follow-Up: Call or return to clinic prn if these symptoms worsen or fail to improve as anticipated.  If you need a refill on your cardiac medications before your next appointment, please call your pharmacy.  Echocardiogram An echocardiogram, or echocardiography, uses sound waves (ultrasound) to produce an image of your heart. The echocardiogram is simple, painless, obtained within a short period of time, and offers valuable information to your health care provider. The images from an echocardiogram can provide information such as:  Evidence of coronary artery disease (CAD).  Heart size.  Heart muscle function.  Heart valve function.  Aneurysm detection.  Evidence of a past heart attack.  Fluid buildup around the heart.  Heart muscle thickening.  Assess heart valve function. LET [place] CARE PROVIDER KNOW ABOUT:  Any allergies you have.  All medicines you are taking, including vitamins, herbs, eye drops, creams, and over-the-counter medicines.  Previous problems you or members of your family have had with the use of anesthetics.  Any blood disorders you have.  Previous surgeries you have had.  Medical conditions you have.  Possibility of pregnancy, if this applies. BEFORE THE PROCEDURE  No special preparation is needed. Eat and drink normally.  PROCEDURE   In order to produce an image of your heart, gel will be applied to your chest and a  wand-like tool (transducer) will be moved over your chest. The gel will help transmit the sound waves from the transducer. The sound waves will harmlessly bounce off your heart to allow the heart images to be captured in real-time motion. These images will then be recorded.  You may need an IV to receive a medicine that improves the quality of the pictures. AFTER THE PROCEDURE You may return to your normal schedule including diet, activities, and medicines, unless your health care provider tells you otherwise.   This information is not intended to replace advice given to you by your health care provider. Make sure you discuss any questions you have with your health care provider.   Document Released: 07/06/2000 Document Revised: 07/30/2014 Document Reviewed: 03/16/2013 Elsevier Interactive Patient Education [employer].

## 2015-12-06 NOTE — Progress Notes (Signed)
Cardiology Office Note   Date:  12/06/2015   ID:  Marie Vaughan, DOB 09-22-1970, MRN [id number]  PCP:  Ashok Norris, MD  Cardiologist:   Kathlyn Sacramento, MD   Chief Complaint  Patient presents with  . other    1 year follow up. Meds reviewed by the patient verbally. Pt. c/o swelling in hands and feet, chest pain, dizziness and decreased BP.       History of Present Illness: Marie Vaughan is a 45 y.o. female who presents for a followup visit regarding chest pain.  She has prolonged history of hypertension which has been reasonably controlled, gastroesophageal reflux disease and obesity.  She was seen In 2015 for recurrent chest pain in spite of negative noninvasive cardiac workup. Cardiac catheterization in 04/2014 showed normal coronary arteries and ejection fraction of 60%. Left ventricular end-diastolic pressure was mildly elevated.  She has been struggling with an upper respiratory tract infection with persistent cough in spite of taking a course of antibiotics. She was given prednisone but did not want to take the medication. Her blood pressure has been running low lately and Benicar-hydrochlorothiazide was held. In spite of that, blood pressure continues to be low. She continues to have intermittent episodes of chest pain which is sharp and gets worse with deep breathing. She reports worsening leg edema after stopping her antihypertensive medication.   Past Medical History  Diagnosis Date  . Vaginal delivery     x 3  . Angio-edema   . HTN (hypertension)   . GERD (gastroesophageal reflux disease)   . Acne   . Anxiety   . Contact lens/glasses fitting     wears contacts or glasses  . Tear of medial meniscus of left knee   . Tear of medial meniscus of right knee   . Iron deficiency anemia secondary to blood loss (chronic)   . Migraine with aura, without mention of intractable migraine without mention of status migrainosus   . Acute bronchitis   . Pain in thoracic spine   .  Pharyngitis   . Dysmenorrhea   . Anxiety state, unspecified   . 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 Cervicalgia   . Obesity   . History of stress test     a. Lexiscan 08/25/13: no sig ischemia, attenuation artifact noted, no sig WMA noted, EF 69%, no EKG changes concerning for ischemia  . History of cardiac cath     a. 04/29/2014: LM nl, LAD no dz, D1 no dz, D2 very small vessel, D3 very small vessel, LCx nl, OM1 very small vessel, OM2 medium sized vessel, OM 3 no dz, RCA no dz, PDA no dz     Past Surgical History  Procedure Laterality Date  . Ovarian cyst removal  04-25-10  . Abdominal hysterectomy  04-25-10    Due to abnormal PAP  . Knee arthroscopy with medial menisectomy Right 06/10/2013    Procedure: RIGHT KNEE ARTHROSCOPY WITH MEDIAL MENISECTOMY;  Surgeon: Lorn Junes, MD;  Location: [place];  Service: Orthopedics;  Laterality: Right;  partial lateral menisectomoy and chondroplasty  . Knee arthroscopy Left 06/10/2013    Procedure: LEFT KNEE ARTHROSCOPY KNEE WITH PARTIAL MEDIAL MENISCECTOMY ;  Surgeon: Lorn Junes, MD;  Location: [place];  Service: Orthopedics;  Laterality: Left;  xerofoam, 4x4's, webril, ace wrap, ice wrap  . Dilation and curettage of uterus    . Cardiac catheterization  04/2014    [facility]  Current Outpatient Prescriptions  Medication Sig Dispense Refill  . albuterol (PROVENTIL HFA;VENTOLIN HFA) 108 (90 Base) MCG/ACT inhaler Inhale 2 puffs into the lungs every 6 (six) hours as needed for wheezing or shortness of breath. 1 Inhaler 0  . ALPRAZolam (XANAX) 0.5 MG tablet TAKE 1 TABLET BY MOUTH TWICE DAILY 30 tablet 5  . aspirin EC 81 MG tablet Take 1 tablet (81 mg total) by mouth daily. 90 tablet 3  . cefdinir (OMNICEF) 300 MG capsule Take 1 capsule (300 mg total) by mouth 2 (two) times daily. 20 capsule 0  . chlorpheniramine-HYDROcodone (TUSSIONEX PENNKINETIC ER) 10-8 MG/5ML SUER Take 5 mLs by mouth every 12  (twelve) hours as needed for cough. 150 mL 0  . Elastic Bandages & Supports (MEDICAL COMPRESSION STOCKINGS) MISC 2 Units by Does not apply route daily. 2 each 0  . esomeprazole (NEXIUM) 40 MG capsule Take 1 capsule (40 mg total) by mouth daily before breakfast.     No current facility-administered medications for this visit.    Allergies:   Chocolate; Iohexol; and Peanut-containing drug products    Social History:  The patient  reports that she has never smoked. She has never used smokeless tobacco. She reports that she does not drink alcohol or use illicit drugs.   Family History:  The patient's family history includes Alcohol abuse in her maternal uncle and other; Arthritis in her other; Diabetes in her maternal aunt and mother; Early death in her mother; Early death (age of onset: 27) in her sister; Gout in her mother; Heart disease in her other; Hyperlipidemia in her maternal aunt; Hypertension in her maternal aunt and mother; Kidney failure in her mother; Other in her other; Prostate cancer in her maternal uncle.    ROS:  Please see the history of present illness.   Otherwise, review of systems are positive for none.   All other systems are reviewed and negative.    PHYSICAL EXAM: VS:  BP 90/58 mmHg  Pulse 87  Ht 5\' 2"  (1.575 m)  Wt 216 lb (97.977 kg)  BMI 39.50 kg/m2 , BMI Body mass index is 39.5 kg/(m^2). GEN: Well nourished, well developed, in no acute distress HEENT: normal Neck: no JVD, carotid bruits, or masses Cardiac: RRR; no murmurs, rubs, or gallops,no edema  Respiratory:  clear to auscultation bilaterally, normal work of breathing GI: soft, nontender, nondistended, + BS MS: no deformity or atrophy Skin: warm and dry, no rash Neuro:  Strength and sensation are intact Psych: euthymic mood, full affect   EKG:  EKG is ordered today. The ekg ordered today demonstrates normal sinus rhythm with no significant ST or T wave changes   Recent Labs: 04/22/2015: ALT 19; BUN  14; Creatinine, Ser 1.07*; Hemoglobin 12.7; Platelets 336; Potassium 3.3*; Sodium 136    Lipid Panel No results found for: CHOL, TRIG, HDL, CHOLHDL, VLDL, LDLCALC, LDLDIRECT    Wt Readings from Last 3 Encounters:  12/06/15 216 lb (97.977 kg)  12/05/15 212 lb 11.2 oz (96.48 kg)  10/02/15 206 lb (93.441 kg)       ASSESSMENT AND PLAN:  1.  Atypical chest pain: The chest pain is overall atypical and does not sound anginal. It has some pleuritic features and given her recent upper respiratory tract infection, I ordered an echocardiogram to ensure no pericardial effusion. Cardiac catheterization in 2015 showed normal coronary arteries.  2. Essential hypertension: The exact etiology of her current hypotension is not clear. I agree with checking routine labs and I  advised the patient to go ahead and get her labs done to ensure no anemia or volume depletion.  3. Leg edema: Likely due to chronic venous insufficiency. I agree with support stockings.    Disposition:   FU with me prn.   Signed,  Kathlyn Sacramento, MD  12/06/2015 3:37 PM    [facility]

## 2015-12-07 DIAGNOSIS — I10 Essential (primary) hypertension: Secondary | ICD-10-CM | POA: Diagnosis not present

## 2015-12-07 DIAGNOSIS — R42 Dizziness and giddiness: Secondary | ICD-10-CM | POA: Diagnosis not present

## 2015-12-08 LAB — CBC WITH DIFFERENTIAL/PLATELET
BASOS: 0 %
Basophils Absolute: 0 10*3/uL (ref 0.0–0.2)
EOS (ABSOLUTE): 0.1 10*3/uL (ref 0.0–0.4)
Eos: 1 %
Hematocrit: 33.9 % — ABNORMAL LOW (ref 34.0–46.6)
Hemoglobin: 11.2 g/dL (ref 11.1–15.9)
IMMATURE GRANULOCYTES: 1 %
Immature Grans (Abs): 0 10*3/uL (ref 0.0–0.1)
Lymphocytes Absolute: 2 10*3/uL (ref 0.7–3.1)
Lymphs: 31 %
MCH: 28.1 pg (ref 26.6–33.0)
MCHC: 33 g/dL (ref 31.5–35.7)
MCV: 85 fL (ref 79–97)
MONOS ABS: 0.4 10*3/uL (ref 0.1–0.9)
Monocytes: 6 %
NEUTROS PCT: 61 %
Neutrophils Absolute: 4 10*3/uL (ref 1.4–7.0)
PLATELETS: 299 10*3/uL (ref 150–379)
RBC: 3.99 x10E6/uL (ref 3.77–5.28)
RDW: 13.9 % (ref 12.3–15.4)
WBC: 6.5 10*3/uL (ref 3.4–10.8)

## 2015-12-08 LAB — COMPREHENSIVE METABOLIC PANEL
A/G RATIO: 1.6 (ref 1.2–2.2)
ALT: 14 IU/L (ref 0–32)
AST: 14 IU/L (ref 0–40)
Albumin: 3.9 g/dL (ref 3.5–5.5)
Alkaline Phosphatase: 57 IU/L (ref 39–117)
BUN/Creatinine Ratio: 13 (ref 9–23)
BUN: 10 mg/dL (ref 6–24)
Bilirubin Total: <0.2 mg/dL (ref 0.0–1.2)
CALCIUM: 9.2 mg/dL (ref 8.7–10.2)
CHLORIDE: 103 mmol/L (ref 96–106)
CO2: 23 mmol/L (ref 18–29)
Creatinine, Ser: 0.77 mg/dL (ref 0.57–1.00)
GFR calc Af Amer: 107 mL/min/{1.73_m2} (ref >59)
GFR calc non Af Amer: 93 mL/min/{1.73_m2} (ref >59)
Globulin, Total: 2.4 g/dL (ref 1.5–4.5)
Glucose: 93 mg/dL (ref 65–99)
POTASSIUM: 4 mmol/L (ref 3.5–5.2)
Sodium: 141 mmol/L (ref 134–144)
Total Protein: 6.3 g/dL (ref 6.0–8.5)

## 2015-12-08 LAB — TSH: TSH: 1.31 u[IU]/mL (ref 0.450–4.500)

## 2015-12-09 ENCOUNTER — Telehealth: Payer: Self-pay | Admitting: Family Medicine

## 2015-12-09 ENCOUNTER — Telehealth: Payer: Self-pay | Admitting: Cardiovascular Disease

## 2015-12-09 NOTE — Telephone Encounter (Signed)
Patient BP is 80/52. Have been drinking lots of water. Have no energy. I did advise her to go to ER or log in and do an e-visit. Patient spoke with [place]. Dr Fletcher Anon has moved her appointment from 31th to 24th

## 2015-12-09 NOTE — Telephone Encounter (Signed)
Instructed patient to increase fluid and salt intake and that per Dr. Fletcher Anon to go to emergency room is SBP<80. Patient verbalized understanding and had no further questions. Will see if we can get in sooner for echocardiogram.

## 2015-12-09 NOTE — Telephone Encounter (Signed)
Patient called in stating that she has had several low blood pressure readings current 80/52 and previous 86/57. She has had dizziness, lightheaded, sweating, and yesterday she "saw stars". She denies any chest pain or shortness of breath. Reviewed her medications to see if to see if anything could be causing low blood pressure. Instructed her to try and hydrate herself and that I would forward to you for recommendations. She verbalized understanding and had no further questions.

## 2015-12-09 NOTE — Telephone Encounter (Signed)
Pt c/o BP issue: STAT if pt c/o blurred vision, one-sided weakness or slurred speech  1. What are your last 5 BP readings?  Just now 80/52 86/57, HR   2. Are you having any other symptoms (ex. Dizziness, headache, blurred vision, passed out)? Dizziness, lightheaded, sweating, seeing stars  3. What is your BP issue? extremely low

## 2015-12-09 NOTE — Telephone Encounter (Signed)
Increase fluid and salt intake. Go the the ED if SBP <80. Expedite echo.

## 2015-12-10 ENCOUNTER — Inpatient Hospital Stay
Admission: EM | Admit: 2015-12-10 | Discharge: 2015-12-15 | DRG: 316 | Disposition: A | Discharge status: Home / Self Care | Payer: 59 | Attending: Internal Medicine | Admitting: Internal Medicine

## 2015-12-10 ENCOUNTER — Emergency Department: Payer: 59

## 2015-12-10 ENCOUNTER — Encounter: Payer: Self-pay | Admitting: Emergency Medicine

## 2015-12-10 ENCOUNTER — Emergency Department (HOSPITAL_BASED_OUTPATIENT_CLINIC_OR_DEPARTMENT_OTHER)
Admit: 2015-12-10 | Discharge: 2015-12-10 | Disposition: A | Discharge status: Home / Self Care | Payer: 59 | Attending: Emergency Medicine | Admitting: Emergency Medicine

## 2015-12-10 ENCOUNTER — Ambulatory Visit (INDEPENDENT_AMBULATORY_CARE_PROVIDER_SITE_OTHER): Payer: 59 | Admitting: Physician Assistant

## 2015-12-10 VITALS — BP 74/52 | HR 100 | Resp 16 | Wt 211.0 lb

## 2015-12-10 DIAGNOSIS — R42 Dizziness and giddiness: Secondary | ICD-10-CM

## 2015-12-10 DIAGNOSIS — R5383 Other fatigue: Secondary | ICD-10-CM | POA: Diagnosis not present

## 2015-12-10 DIAGNOSIS — R51 Headache: Secondary | ICD-10-CM | POA: Diagnosis not present

## 2015-12-10 DIAGNOSIS — R079 Chest pain, unspecified: Secondary | ICD-10-CM

## 2015-12-10 DIAGNOSIS — Z79899 Other long term (current) drug therapy: Secondary | ICD-10-CM | POA: Diagnosis not present

## 2015-12-10 DIAGNOSIS — I950 Idiopathic hypotension: Principal | ICD-10-CM

## 2015-12-10 DIAGNOSIS — R55 Syncope and collapse: Secondary | ICD-10-CM

## 2015-12-10 DIAGNOSIS — Z7982 Long term (current) use of aspirin: Secondary | ICD-10-CM

## 2015-12-10 DIAGNOSIS — I959 Hypotension, unspecified: Secondary | ICD-10-CM

## 2015-12-10 DIAGNOSIS — R930 Abnormal findings on diagnostic imaging of skull and head, not elsewhere classified: Secondary | ICD-10-CM | POA: Diagnosis not present

## 2015-12-10 DIAGNOSIS — E861 Hypovolemia: Secondary | ICD-10-CM | POA: Diagnosis present

## 2015-12-10 DIAGNOSIS — K219 Gastro-esophageal reflux disease without esophagitis: Secondary | ICD-10-CM | POA: Diagnosis present

## 2015-12-10 DIAGNOSIS — I10 Essential (primary) hypertension: Secondary | ICD-10-CM | POA: Diagnosis not present

## 2015-12-10 DIAGNOSIS — R519 Headache, unspecified: Secondary | ICD-10-CM

## 2015-12-10 LAB — BRAIN NATRIURETIC PEPTIDE: B NATRIURETIC PEPTIDE 5: 6 pg/mL (ref 0.0–100.0)

## 2015-12-10 LAB — URINALYSIS COMPLETE WITH MICROSCOPIC (ARMC ONLY)
BACTERIA UA: NONE SEEN
Bilirubin Urine: NEGATIVE
GLUCOSE, UA: NEGATIVE mg/dL
HGB URINE DIPSTICK: NEGATIVE
KETONES UR: NEGATIVE mg/dL
LEUKOCYTES UA: NEGATIVE
NITRITE: NEGATIVE
Protein, ur: NEGATIVE mg/dL
RBC / HPF: NONE SEEN RBC/hpf (ref 0–5)
SPECIFIC GRAVITY, URINE: 1.015 (ref 1.005–1.030)
pH: 6 (ref 5.0–8.0)

## 2015-12-10 LAB — CBC
HCT: 37.7 % (ref 35.0–47.0)
HEMOGLOBIN: 12.7 g/dL (ref 12.0–16.0)
MCH: 28.1 pg (ref 26.0–34.0)
MCHC: 33.6 g/dL (ref 32.0–36.0)
MCV: 83.6 fL (ref 80.0–100.0)
Platelets: 301 10*3/uL (ref 150–440)
RBC: 4.51 MIL/uL (ref 3.80–5.20)
RDW: 14.2 % (ref 11.5–14.5)
WBC: 8.1 10*3/uL (ref 3.6–11.0)

## 2015-12-10 LAB — BASIC METABOLIC PANEL
ANION GAP: 8 (ref 5–15)
BUN: 12 mg/dL (ref 6–20)
CO2: 25 mmol/L (ref 22–32)
Calcium: 9.9 mg/dL (ref 8.9–10.3)
Chloride: 103 mmol/L (ref 101–111)
Creatinine, Ser: 0.76 mg/dL (ref 0.44–1.00)
GFR calc Af Amer: >60 mL/min (ref >60)
Glucose, Bld: 89 mg/dL (ref 65–99)
POTASSIUM: 3.3 mmol/L — AB (ref 3.5–5.1)
SODIUM: 136 mmol/L (ref 135–145)

## 2015-12-10 LAB — LACTIC ACID, PLASMA
Lactic Acid, Venous: 1.2 mmol/L (ref 0.5–2.0)
Lactic Acid, Venous: 0.9 mmol/L (ref 0.5–2.0)

## 2015-12-10 LAB — HEPATIC FUNCTION PANEL
ALBUMIN: 4.3 g/dL (ref 3.5–5.0)
ALT: 21 U/L (ref 14–54)
AST: 21 U/L (ref 15–41)
Alkaline Phosphatase: 51 U/L (ref 38–126)
TOTAL PROTEIN: 8 g/dL (ref 6.5–8.1)
Total Bilirubin: 0.5 mg/dL (ref 0.3–1.2)

## 2015-12-10 LAB — GLUCOSE, CAPILLARY: Glucose-Capillary: 61 mg/dL — ABNORMAL LOW (ref 65–99)

## 2015-12-10 LAB — ECHOCARDIOGRAM COMPLETE
Height: 62 in
Weight: 3376 oz

## 2015-12-10 LAB — FIBRIN DERIVATIVES D-DIMER (ARMC ONLY): Fibrin derivatives D-dimer (ARMC): 431 (ref 0–499)

## 2015-12-10 LAB — TROPONIN I
Troponin I: <0.03 ng/mL (ref <0.031)
Troponin I: <0.03 ng/mL (ref <0.031)

## 2015-12-10 MED ORDER — MEDICAL COMPRESSION STOCKINGS MISC
2.0000 [IU] | Freq: Every day | Status: DC
  Administered 2015-12-10 – 2015-12-15 (×5): 2 [IU]
  Filled 2015-12-10 (×5): qty 1

## 2015-12-10 MED ORDER — ONDANSETRON HCL 4 MG/2ML IJ SOLN
4.0000 mg | Freq: Four times a day (QID) | INTRAMUSCULAR | Status: DC | PRN
  Administered 2015-12-12: 4 mg via INTRAVENOUS
  Filled 2015-12-10: qty 2

## 2015-12-10 MED ORDER — ONDANSETRON HCL 4 MG PO TABS: 4.0000 mg | ORAL_TABLET | Freq: Four times a day (QID) | ORAL | Status: DC | PRN

## 2015-12-10 MED ORDER — PANTOPRAZOLE SODIUM 40 MG PO TBEC
40.0000 mg | DELAYED_RELEASE_TABLET | Freq: Every day | ORAL | Status: DC
  Administered 2015-12-10 – 2015-12-15 (×6): 40 mg via ORAL
  Filled 2015-12-10 (×6): qty 1

## 2015-12-10 MED ORDER — ASPIRIN EC 81 MG PO TBEC
81.0000 mg | DELAYED_RELEASE_TABLET | Freq: Every day | ORAL | Status: DC
  Administered 2015-12-10 – 2015-12-15 (×6): 81 mg via ORAL
  Filled 2015-12-10 (×6): qty 1

## 2015-12-10 MED ORDER — SODIUM CHLORIDE 0.9 % IV SOLN
Freq: Once | INTRAVENOUS | Status: AC
  Administered 2015-12-10: 15:00:00 via INTRAVENOUS

## 2015-12-10 MED ORDER — SODIUM CHLORIDE 0.9 % IV SOLN
INTRAVENOUS | Status: AC
  Administered 2015-12-10 – 2015-12-11 (×2): via INTRAVENOUS

## 2015-12-10 MED ORDER — ENOXAPARIN SODIUM 40 MG/0.4ML ~~LOC~~ SOLN
40.0000 mg | SUBCUTANEOUS | Status: DC
  Administered 2015-12-10 – 2015-12-11 (×2): 40 mg via SUBCUTANEOUS
  Filled 2015-12-10 (×2): qty 0.4

## 2015-12-10 MED ORDER — POTASSIUM CHLORIDE 20 MEQ PO PACK
40.0000 meq | PACK | Freq: Once | ORAL | Status: AC
  Administered 2015-12-10: 40 meq via ORAL
  Filled 2015-12-10: qty 2

## 2015-12-10 MED ORDER — ACETAMINOPHEN 325 MG PO TABS
650.0000 mg | ORAL_TABLET | Freq: Four times a day (QID) | ORAL | Status: DC | PRN
  Administered 2015-12-10 – 2015-12-13 (×5): 650 mg via ORAL
  Filled 2015-12-10 (×5): qty 2

## 2015-12-10 MED ORDER — DOCUSATE SODIUM 100 MG PO CAPS
100.0000 mg | ORAL_CAPSULE | Freq: Two times a day (BID) | ORAL | Status: DC
  Administered 2015-12-10 – 2015-12-15 (×10): 100 mg via ORAL
  Filled 2015-12-10 (×10): qty 1

## 2015-12-10 MED ORDER — ALPRAZOLAM 0.25 MG PO TABS
0.5000 mg | ORAL_TABLET | Freq: Two times a day (BID) | ORAL | Status: DC
  Administered 2015-12-10 – 2015-12-15 (×10): 0.5 mg via ORAL
  Filled 2015-12-10 (×10): qty 2

## 2015-12-10 MED ORDER — ALBUTEROL SULFATE (2.5 MG/3ML) 0.083% IN NEBU: 3.0000 mL | INHALATION_SOLUTION | Freq: Four times a day (QID) | RESPIRATORY_TRACT | Status: DC | PRN

## 2015-12-10 MED ORDER — ACETAMINOPHEN 650 MG RE SUPP
650.0000 mg | Freq: Four times a day (QID) | RECTAL | Status: DC | PRN
Start: 2015-12-10 — End: 2015-12-15

## 2015-12-10 NOTE — ED Notes (Signed)
Report received from Wilder, rn.

## 2015-12-10 NOTE — Progress Notes (Signed)
Subjective:    Patient ID: Marie Vaughan, female    DOB: Jul 20, 1971, 45 y.o.   MRN: [id number]  Dizziness This is a new (over last 2-3 weeks) problem. The current episode started 1 to 4 weeks ago. The problem occurs constantly (increases in severity approx 5 times per day. Especially with movements). The problem has been gradually worsening. Associated symptoms include chest pain, diaphoresis, fatigue, headaches, nausea, numbness (noticing numbness/tingling in upper extremities and hands bilaterally) and weakness. Pertinent negatives include no abdominal pain, anorexia, arthralgias, change in bowel habit, chills, congestion, coughing, fever, joint swelling, myalgias, neck pain, rash, sore throat, swollen glands, urinary symptoms, vertigo, visual change or vomiting. The symptoms are aggravated by bending, standing, stress and walking. She has tried drinking and rest (increased salt intake and water intake) for the symptoms. The treatment provided no relief.   She was feeling bad one day and had an episode of shaking, sweating and dizziness. She had her BP checked in the office and it was low. She went to see her PCP and had labs drawn, which were all fairly stable and WNL. Her Benicar was discontinued at that time. She was then sent to her cardiologist. He has scheduled an echo for next Weds (12/14/15). She also reports that when she has been out with her husband they were walking around and he told her to stand straight. She felt like she was walking fine. They walked a little longer then he told her he was taking her home because she was walking drunk and like she was about to fall over.   She comes to the office today because she awoke with sweating, chest discomfort, dizziness, and fatigue. Her BP today in the office was low 80-90s/60-70s. When we moved her to get an ECG she started having symptoms again and her BP was rechecked and had dropped more to 78/54.    Review of Systems  Constitutional:  Positive for diaphoresis and fatigue. Negative for fever and chills.  HENT: Negative for congestion and sore throat.   Respiratory: Negative for cough.   Cardiovascular: Positive for chest pain.  Gastrointestinal: Positive for nausea. Negative for vomiting, abdominal pain, anorexia and change in bowel habit.  Musculoskeletal: Negative for myalgias, joint swelling, arthralgias and neck pain.  Skin: Negative for rash.  Neurological: Positive for dizziness, weakness, numbness (noticing numbness/tingling in upper extremities and hands bilaterally) and headaches. Negative for vertigo.       Objective:   Physical Exam  Constitutional: She appears well-developed and well-nourished. No distress.  HENT:  Head: Normocephalic and atraumatic.  Right Ear: Hearing, tympanic membrane, external ear and ear canal normal.  Left Ear: Hearing, tympanic membrane, external ear and ear canal normal.  Nose: Nose normal.  Mouth/Throat: Uvula is midline, oropharynx is clear and moist and mucous membranes are normal. No oropharyngeal exudate.  Eyes: Conjunctivae are normal. Pupils are equal, round, and reactive to light. Right eye exhibits no discharge. Left eye exhibits no discharge. No scleral icterus.  Neck: Normal range of motion. Neck supple. No JVD present. No tracheal deviation present. No thyromegaly present.  Cardiovascular: Normal rate, regular rhythm and normal heart sounds.  Exam reveals no gallop and no friction rub.   No murmur heard. Pulmonary/Chest: Effort normal and breath sounds normal. No stridor. No respiratory distress. She has no wheezes. She has no rales.  Musculoskeletal: She exhibits no edema.  Lymphadenopathy:    She has no cervical adenopathy.  Skin: Skin is warm and dry. She  is not diaphoretic.  Vitals reviewed.      Assessment & Plan:  1. Hypotension, unspecified hypotension type Advised patient that because her BP is so low and not responding to conservative measures and that  she was symptomatic she should go to [facility] ER. She agrees. She did have her son with her today and he drove her to hospital.  2. Dizziness See above medical treatment plan. - EKG 12-Lead  3. Other fatigue See above medical treatment plan.

## 2015-12-10 NOTE — ED Notes (Addendum)
Pt continues to to be angry and telling RN that we are not placing her orders under a "Stat" status. Pt complaining that it is taking too long and questioning what the Doctor's are ordering.

## 2015-12-10 NOTE — ED Notes (Signed)
Pt stated that she did not have to use the rest room while staff was present. Pt. Asked for (2) straws and were given them for her beverages. Staff asked pt if anything else was required of them, pt. Verbalized that she was fine and that she would call if anything was needed.

## 2015-12-10 NOTE — ED Notes (Signed)
Pt updated on wait time for admission. Pt eating, requesting "something for my headache."

## 2015-12-10 NOTE — ED Provider Notes (Addendum)
-----------------------------------------   5:45 PM on 12/10/2015 -----------------------------------------  Care was assumed from Dr. Cinda Quest at approximately 4 PM pending the results of the patient's labs which are generally unremarkable. Her blood pressure has improved to 111/72 at this time but she continues to feel lightheaded. She has been in the emergency department for several hours without improvement of her symptoms and despite IV fluids therefore I discussed the case with the hospitalist, Dr. Margaretmary Eddy, for admission at this time.  ----------------------------------------- 6:24 PM on 12/10/2015 -----------------------------------------  Dr. Margaretmary Eddy has seen the patient however the patient has now changed her mind and does not want admission. Her blood pressure remains stable at 110/67, she is no longer dizzy and she is now refusing admission. She'll follow-up with Dr. Fletcher Anon and her primary care doctor in the next 2-3 days. She already has a scheduled appointment with Dr. Fletcher Anon on 12/13/15. DC with return precautions.  ----------------------------------------- 6:43 PM on 12/10/2015 -----------------------------------------  Patient changed her mind. Now wanting admission. Dr. Margaretmary Eddy to put in admission orders.  Joanne Gavel, MD 12/10/15 [place] Marshawn Ninneman, MD 12/10/15 [id number]  Joanne Gavel, MD 12/10/15 [id number]

## 2015-12-10 NOTE — ED Notes (Addendum)
Pt to ed with c/o dizziness and lightheaded and weakness x 3 weeks.  Pt reports bp has been low.

## 2015-12-10 NOTE — ED Notes (Signed)
Pt updated on admission process. Pt verbalizes understanding. Pt states "i heard all kink of HIPPA information while i was waiting, i heard that man's name, what was wrong with him. I'm gonna file all kinds of complaints." pt offered this rn to inform house supervisor, pt states "i can wait until i'm in my room." pt states "i think i have to go to the bathroom." pt informed staff member is present at this time to assist pt with ambulation to restroom. Pt states "i'll just wait until i get in my room." pt provided with po fluids and meal tray. Call bell at side. Call placed to ann, house supervisor to inform of pt's request to file complaint.

## 2015-12-10 NOTE — ED Notes (Signed)
As pt was being called to be roomed, pt approached the desk stating she was not going to wait 3 hours to be seen, acting very agitated, rude to this RN, I explained to pt that she was being roomed at this time. Pt continued to mumble as she was walking through triage door, sounding irritated as she walked.

## 2015-12-10 NOTE — H&P (Signed)
[facility] NAME: Marie Vaughan    MR#:  [id number]  DATE OF BIRTH:  16-Jul-1971  DATE OF ADMISSION:  12/10/2015  PRIMARY CARE PHYSICIAN: Ashok Norris, MD   REQUESTING/REFERRING PHYSICIAN: Edd Fabian  CHIEF COMPLAINT:   Dizziness HISTORY OF PRESENT ILLNESS:  Marie Vaughan  is a 45 y.o. female with a known history of Essential hypertension, GERD, obesity and other medical problems is presenting to the ED with a chief complaint of generalized weakness associated with dizziness which has been progressively getting worse for the past 3 weeks. Patient was sent over to the emergency department from [place] office today for hypotension and systolic blood pressure being at 70s to 80s. Patient was recently treated with a Omnicef and azithromycin for cough which was improved but not completely gone. Sometimes patient is getting shortness of breath and chest pressure while walking. Patient was given fluid boluses and during my examination and her systolic blood pressure is at around 120s to 140s. Patient sees cardiology Dr. Fletcher Anon as an outpatient. Denies any nausea vomiting or abdominal pain. Denies any chest pain during my examination  PAST MEDICAL HISTORY:   Past Medical History  Diagnosis Date  . Vaginal delivery     x 3  . Angio-edema   . HTN (hypertension)   . GERD (gastroesophageal reflux disease)   . Acne   . Anxiety   . Contact lens/glasses fitting     wears contacts or glasses  . Tear of medial meniscus of left knee   . Tear of medial meniscus of right knee   . Iron deficiency anemia secondary to blood loss (chronic)   . Migraine with aura, without mention of intractable migraine without mention of status migrainosus   . Acute bronchitis   . Pain in thoracic spine   . Pharyngitis   . Dysmenorrhea   . Anxiety state, unspecified   . 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 Cervicalgia   . Obesity   . History of stress  test     a. Lexiscan 08/25/13: no sig ischemia, attenuation artifact noted, no sig WMA noted, EF 69%, no EKG changes concerning for ischemia  . History of cardiac cath     a. 04/29/2014: LM nl, LAD no dz, D1 no dz, D2 very small vessel, D3 very small vessel, LCx nl, OM1 very small vessel, OM2 medium sized vessel, OM 3 no dz, RCA no dz, PDA no dz     PAST SURGICAL HISTOIRY:   Past Surgical History  Procedure Laterality Date  . Ovarian cyst removal  04-25-10  . Abdominal hysterectomy  04-25-10    Due to abnormal PAP  . Knee arthroscopy with medial menisectomy Right 06/10/2013    Procedure: RIGHT KNEE ARTHROSCOPY WITH MEDIAL MENISECTOMY;  Surgeon: Lorn Junes, MD;  Location: [place];  Service: Orthopedics;  Laterality: Right;  partial lateral menisectomoy and chondroplasty  . Knee arthroscopy Left 06/10/2013    Procedure: LEFT KNEE ARTHROSCOPY KNEE WITH PARTIAL MEDIAL MENISCECTOMY ;  Surgeon: Lorn Junes, MD;  Location: [place];  Service: Orthopedics;  Laterality: Left;  xerofoam, 4x4's, webril, ace wrap, ice wrap  . Dilation and curettage of uterus    . Cardiac catheterization  04/2014    [facility]    SOCIAL HISTORY:   Social History  Substance Use Topics  . Smoking status: Never Smoker   . Smokeless tobacco: Never Used  . Alcohol Use:  No    FAMILY HISTORY:   Family History  Problem Relation Age of Onset  . Early death Mother     Murdered when pt was 45 years old  . Diabetes Mother   . Hypertension Mother   . Gout Mother   . Kidney failure Mother   . Early death Sister 40    due to hemorage  . Alcohol abuse Maternal Uncle   . Alcohol abuse Other   . Arthritis Other   . Heart disease Other   . Other Other     cousin- phlebitis  . Diabetes Maternal Aunt   . Hyperlipidemia Maternal Aunt   . Hypertension Maternal Aunt   . Prostate cancer Maternal Uncle     DRUG ALLERGIES:   Allergies  Allergen Reactions  . Chocolate     Hives,  itching  . Iohexol     CP and SOB  . Peanut-Containing Drug Products Swelling    cashews    REVIEW OF SYSTEMS:  CONSTITUTIONAL: No fever, fatigue or weakness.  EYES: No blurred or double vision.  EARS, NOSE, AND THROAT: No tinnitus or ear pain.  RESPIRATORY: Intermittent cough, exertional shortness of breath, denies wheezing or hemoptysis.  CARDIOVASCULAR: Chest pressure with dyspnea but denies resting chest pain, denies orthopnea, edema.  GASTROINTESTINAL: No nausea, vomiting, diarrhea or abdominal pain.  GENITOURINARY: No dysuria, hematuria.  ENDOCRINE: No polyuria, nocturia,  HEMATOLOGY: No anemia, easy bruising or bleeding SKIN: No rash or lesion. MUSCULOSKELETAL: No joint pain or arthritis.   NEUROLOGIC: No tingling, numbness, weakness.  PSYCHIATRY: No anxiety or depression.   MEDICATIONS AT HOME:   Prior to Admission medications   Medication Sig Start Date End Date Taking? Authorizing Provider  albuterol (PROVENTIL HFA;VENTOLIN HFA) 108 (90 Base) MCG/ACT inhaler Inhale 2 puffs into the lungs every 6 (six) hours as needed for wheezing or shortness of breath. 11/30/15  Yes Versie Starks, PA-C  ALPRAZolam Duanne Moron) 0.5 MG tablet TAKE 1 TABLET BY MOUTH TWICE DAILY Patient taking differently: TAKE 1 TABLET BY MOUTH TWICE DAILY AS NEEDED FOR ANXIETY. 09/06/15  Yes Ashok Norris, MD  aspirin EC 81 MG tablet Take 1 tablet (81 mg total) by mouth daily. 04/21/14  Yes Ryan M Dunn, PA-C  chlorpheniramine-HYDROcodone (TUSSIONEX PENNKINETIC ER) 10-8 MG/5ML SUER Take 5 mLs by mouth every 12 (twelve) hours as needed for cough. 11/30/15  Yes Versie Starks, PA-C  Elastic Bandages & Supports (MEDICAL COMPRESSION STOCKINGS) MISC 2 Units by Does not apply route daily. 12/05/15  Yes Steele Sizer, MD  esomeprazole (NEXIUM) 40 MG capsule Take 1 capsule (40 mg total) by mouth daily before breakfast. 01/05/13  Yes Tanda Rockers, MD  olmesartan-hydrochlorothiazide (BENICAR HCT) 40-25 MG tablet Take 1  tablet by mouth daily. 11/14/15  Yes Historical Provider, MD  cefdinir (OMNICEF) 300 MG capsule Take 1 capsule (300 mg total) by mouth 2 (two) times daily. 11/30/15   Versie Starks, PA-C      VITAL SIGNS:  Blood pressure 110/67, pulse 87, temperature 98 F (36.7 C), temperature source Oral, resp. rate 14, height 5\' 2"  (1.575 m), weight 95.709 kg (211 lb), SpO2 100 %.  PHYSICAL EXAMINATION:  GENERAL:  45 y.o.-year-old patient lying in the bed with no acute distress.  EYES: Pupils equal, round, reactive to light and accommodation. No scleral icterus. Extraocular muscles intact.  HEENT: Head atraumatic, normocephalic. Oropharynx and nasopharynx clear.  NECK:  Supple, no jugular venous distention. No thyroid enlargement, no tenderness.  LUNGS: Normal breath  sounds bilaterally, no wheezing, rales,rhonchi or crepitation. No use of accessory muscles of respiration.  CARDIOVASCULAR: S1, S2 normal. No murmurs, rubs, or gallops.  ABDOMEN: Soft, nontender, nondistended. Bowel sounds present. No organomegaly or mass.  EXTREMITIES: No pedal edema, cyanosis, or clubbing.  NEUROLOGIC: Cranial nerves II through XII are intact. Muscle strength 5/5 in all extremities. Sensation intact. Gait not checked.  PSYCHIATRIC: The patient is alert and oriented x 3.  SKIN: No obvious rash, lesion, or ulcer.   LABORATORY PANEL:   CBC  Recent Labs Lab 12/10/15 1135  WBC 8.1  HGB 12.7  HCT 37.7  PLT 301   ------------------------------------------------------------------------------------------------------------------  Chemistries   Recent Labs Lab 12/10/15 1135 12/10/15 1417  NA 136  --   K 3.3*  --   CL 103  --   CO2 25  --   GLUCOSE 89  --   BUN 12  --   CREATININE 0.76  --   CALCIUM 9.9  --   AST  --  21  ALT  --  21  ALKPHOS  --  51  BILITOT  --  0.5   ------------------------------------------------------------------------------------------------------------------  Cardiac  Enzymes  Recent Labs Lab 12/10/15 1417  TROPONINI <0.03   ------------------------------------------------------------------------------------------------------------------  RADIOLOGY:  Dg Chest 2 View  12/10/2015  CLINICAL DATA:  45 year old female with a history of dizziness EXAM: CHEST  2 VIEW COMPARISON:  10/02/2015 FINDINGS: The heart size and mediastinal contours are within normal limits. Both lungs are clear. The visualized skeletal structures are unremarkable. IMPRESSION: No active cardiopulmonary disease. Signed, Dulcy Fanny. Earleen Newport, DO [facility] Electronically Signed   By: Corrie Mckusick D.O.   On: 12/10/2015 15:18    EKG:   Orders placed or performed during the hospital encounter of 12/10/15  . ED EKG  . ED EKG    IMPRESSION AND PLAN:   Lital Zapp  is a 45 y.o. female with a known history of Essential hypertension, GERD, obesity and other medical problems is presenting to the ED with a chief complaint of generalized weakness associated with dizziness which has been progressively getting worse for the past 3 weeks. Patient was sent over to the emergency department from [place] office today for hypotension and systolic blood pressure being at 70s to 80s. Patient was recently treated with a Omnicef and azithromycin for cough which was improved but not completely gone. Sometimes patient is getting shortness of breath and chest pressure while walking.  # Near syncope probably from hypotension Admit patient to MedSurg unit with telemetry monitoring Hold antihypertensives and provide IV fluids. Boluses were given in the ED. We will continue IV fluids and monitor blood pressure closely Check orthostatics and neuro checks Cycle cardiac biomarkers Cardiac consult is placed to Dr. Fletcher Anon We will check cortisol level Echocardiogram was done in the ED which was normal with ejection fraction 55-60% PT consult is placed Further  management and plan of care per rounding physician's discretion D dimers are in the normal range  #2 essential hypertension currently patient is hypotensive Holding her anti htn home medications Check cortisol level  #GERD continue PPI  #Anxiety continue her home medication alprazolam for anxiety   Provided DVT prophylaxis  All the records are reviewed and case discussed with ED provider. Management plans discussed with the patient, family and they are in agreement.  CODE STATUS: fc/son  TOTAL TIME TAKING CARE OF THIS PATIENT: 46 minutes.    Nicholes Mango M.D on 12/10/2015 at 7:00 PM  Between 7am to 6pm - Pager - [phone]  After 6pm go to [email or web address] - password [email or web address] [facility]  Office  [phone]  CC: Primary care physician; Ashok Norris, MD

## 2015-12-10 NOTE — ED Provider Notes (Addendum)
[facility] Emergency Department Provider Note   ____________________________________________  Time seen: Approximately 1:30 PM  I have reviewed the triage vital signs and the nursing notes.   HISTORY  Chief Complaint Hypotension    HPI Marie Vaughan is a 45 y.o. female patient who has a history of hypertension has been feeling weak and dizzy for the last 3 weeks. She was sent today from the doctor's office for hypotension blood pressure is been in the [id number] and [id number] systolic. She has acough off and on for 3 weeks. She got Zithromax and Omnicef and cough improved but did not go away. Patient reports occasional sharp stabbing chest pains he come on without any warning and sometimes when she walks she gets short of breath and chest pressure but this is not every time she walks. Dr. Fletcher Anon, who sees her for her chest pain told her about 3 weeks ago to increase her salt and water intake. This has not helped. She has no abdominal pain fever nausea vomiting or any other complaints.   Past Medical History  Diagnosis Date  . Vaginal delivery     x 3  . Angio-edema   . HTN (hypertension)   . GERD (gastroesophageal reflux disease)   . Acne   . Anxiety   . Contact lens/glasses fitting     wears contacts or glasses  . Tear of medial meniscus of left knee   . Tear of medial meniscus of right knee   . Iron deficiency anemia secondary to blood loss (chronic)   . Migraine with aura, without mention of intractable migraine without mention of status migrainosus   . Acute bronchitis   . Pain in thoracic spine   . Pharyngitis   . Dysmenorrhea   . Anxiety state, unspecified   . 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 Cervicalgia   . Obesity   . History of stress test     a. Lexiscan 08/25/13: no sig ischemia, attenuation artifact noted, no sig WMA noted, EF 69%, no EKG changes concerning for ischemia  . History of cardiac cath     a. 04/29/2014: LM nl, LAD no dz,  D1 no dz, D2 very small vessel, D3 very small vessel, LCx nl, OM1 very small vessel, OM2 medium sized vessel, OM 3 no dz, RCA no dz, PDA no dz     Patient Active Problem List   Diagnosis Date Noted  . Atypical chest pain 08/22/2013  . Essential hypertension 08/22/2013  . Cough 01/05/2013  . Angio-edema   . GERD (gastroesophageal reflux disease)   . Migraine   . Acne   . Anxiety   . Allergic rhinitis 10/05/2011  . Insomnia 08/30/2011  . Obesity 08/30/2011    Past Surgical History  Procedure Laterality Date  . Ovarian cyst removal  04-25-10  . Abdominal hysterectomy  04-25-10    Due to abnormal PAP  . Knee arthroscopy with medial menisectomy Right 06/10/2013    Procedure: RIGHT KNEE ARTHROSCOPY WITH MEDIAL MENISECTOMY;  Surgeon: Lorn Junes, MD;  Location: [place];  Service: Orthopedics;  Laterality: Right;  partial lateral menisectomoy and chondroplasty  . Knee arthroscopy Left 06/10/2013    Procedure: LEFT KNEE ARTHROSCOPY KNEE WITH PARTIAL MEDIAL MENISCECTOMY ;  Surgeon: Lorn Junes, MD;  Location: [place];  Service: Orthopedics;  Laterality: Left;  xerofoam, 4x4's, webril, ace wrap, ice wrap  . Dilation and curettage of uterus    . Cardiac  catheterization  04/2014    [facility]    Current Outpatient Rx  Name  Route  Sig  Dispense  Refill  . albuterol (PROVENTIL HFA;VENTOLIN HFA) 108 (90 Base) MCG/ACT inhaler   Inhalation   Inhale 2 puffs into the lungs every 6 (six) hours as needed for wheezing or shortness of breath.   1 Inhaler   0   . ALPRAZolam (XANAX) 0.5 MG tablet      TAKE 1 TABLET BY MOUTH TWICE DAILY Patient taking differently: TAKE 1 TABLET BY MOUTH TWICE DAILY AS NEEDED FOR ANXIETY.   30 tablet   5   . aspirin EC 81 MG tablet   Oral   Take 1 tablet (81 mg total) by mouth daily.   90 tablet   3   . chlorpheniramine-HYDROcodone (TUSSIONEX PENNKINETIC ER) 10-8 MG/5ML SUER   Oral   Take 5 mLs by mouth every 12  (twelve) hours as needed for cough.   150 mL   0     rx written   . Elastic Bandages & Supports (MEDICAL COMPRESSION STOCKINGS) MISC   Does not apply   2 Units by Does not apply route daily.   2 each   0   . esomeprazole (NEXIUM) 40 MG capsule   Oral   Take 1 capsule (40 mg total) by mouth daily before breakfast.         . olmesartan-hydrochlorothiazide (BENICAR HCT) 40-25 MG tablet   Oral   Take 1 tablet by mouth daily.      6   . cefdinir (OMNICEF) 300 MG capsule   Oral   Take 1 capsule (300 mg total) by mouth 2 (two) times daily.   20 capsule   0     Allergies Chocolate; Iohexol; and Peanut-containing drug products  Family History  Problem Relation Age of Onset  . Early death Mother     Murdered when pt was 52 years old  . Diabetes Mother   . Hypertension Mother   . Gout Mother   . Kidney failure Mother   . Early death Sister 69    due to hemorage  . Alcohol abuse Maternal Uncle   . Alcohol abuse Other   . Arthritis Other   . Heart disease Other   . Other Other     cousin- phlebitis  . Diabetes Maternal Aunt   . Hyperlipidemia Maternal Aunt   . Hypertension Maternal Aunt   . Prostate cancer Maternal Uncle     Social History Social History  Substance Use Topics  . Smoking status: Never Smoker   . Smokeless tobacco: Never Used  . Alcohol Use: No    Review of Systems Constitutional: No fever/chills Eyes: No visual changes. ENT: No sore throat. Cardiovascular: Denies chest pain. Respiratory: Denies shortness of breath. Gastrointestinal: No abdominal pain.  No nausea, no vomiting.  No diarrhea.  No constipation. Genitourinary: Negative for dysuria. Musculoskeletal: Negative for back pain. Skin: Negative for rash. Neurological: Negative for headaches, focal weakness or numbness.  10-point ROS otherwise negative.  ____________________________________________   PHYSICAL EXAM:  VITAL SIGNS: ED Triage Vitals  Enc Vitals Group     BP  12/10/15 1118 106/72 mmHg     Pulse Rate 12/10/15 1118 93     Resp 12/10/15 1118 20     Temp 12/10/15 1118 98 F (36.7 C)     Temp Source 12/10/15 1118 Oral     SpO2 12/10/15 1118 100 %     Weight 12/10/15 1118  211 lb (95.709 kg)     Height 12/10/15 1118 5\' 2"  (1.575 m)     Head Cir --      Peak Flow --      Pain Score 12/10/15 1119 10     Pain Loc --      Pain Edu? --      Excl. in [place]? --     Constitutional: Alert and oriented. Well appearing and in no acute distress. Eyes: Conjunctivae are normal. PERRL. EOMI. Head: Atraumatic. Nose: No congestion/rhinnorhea. Mouth/Throat: Mucous membranes are moist.  Oropharynx non-erythematous. Neck: No stridor. Cardiovascular: Normal rate, regular rhythm. Grossly normal heart sounds.  Good peripheral circulation. Respiratory: Normal respiratory effort.  No retractions. Lungs CTAB. Gastrointestinal: Soft and nontender. No distention. No abdominal bruits. No CVA tenderness. Musculoskeletal: No lower extremity tenderness nor edema.  No joint effusions. Neurologic:  Normal speech and language. No gross focal neurologic deficits are appreciated. No gait instability. Skin:  Skin is warm, dry and intact. No rash noted. Psychiatric: Mood and affect are normal. Speech and behavior are normal.  ____________________________________________   LABS (all labs ordered are listed, but only abnormal results are displayed)  Labs Reviewed  BASIC METABOLIC PANEL - Abnormal; Notable for the following:    Potassium 3.3 (*)    All other components within normal limits  URINALYSIS COMPLETEWITH MICROSCOPIC ([facility] ONLY) - Abnormal; Notable for the following:    Color, Urine YELLOW (*)    APPearance CLEAR (*)    Squamous Epithelial / LPF 0-5 (*)    All other components within normal limits  GLUCOSE, CAPILLARY - Abnormal; Notable for the following:    Glucose-Capillary 61 (*)    All other components within normal limits  HEPATIC FUNCTION PANEL - Abnormal;  Notable for the following:    Bilirubin, Direct <0.1 (*)    All other components within normal limits  CBC  TROPONIN I  LACTIC ACID, PLASMA  LACTIC ACID, PLASMA  BRAIN NATRIURETIC PEPTIDE  PREGNANCY, URINE  FIBRIN DERIVATIVES D-DIMER ([facility] ONLY)  CBG MONITORING, ED   ____________________________________________  EKG  EKG read and interpreted by me shows normal sinus rhythm rate of 84 normal axis. His reading borderline prolonged QT interval QTC is 505 ms. ____________________________________________  RADIOLOGY   ____________________________________________   PROCEDURES  ____________________________________________   INITIAL IMPRESSION / ASSESSMENT AND PLAN / ED COURSE  Pertinent labs & imaging results that were available during my care of the patient were reviewed by me and considered in my medical decision making (see chart for details).  Discussed patient with cardiologist will try to get the echo today. Patient is aware  Will sign the patient out to Dr. Baker Janus ____________________________________________   FINAL CLINICAL IMPRESSION(S) / ED DIAGNOSES  Final diagnoses:  Idiopathic hypotension      NEW MEDICATIONS STARTED DURING THIS VISIT:  New Prescriptions   No medications on file     Note:  This document was prepared using Dragon voice recognition software and may include unintentional dictation errors.    Nena Polio, MD 12/10/15 [id number]  Nena Polio, MD 12/10/15 [phone]

## 2015-12-10 NOTE — Progress Notes (Signed)
*  PRELIMINARY RESULTS* Echocardiogram 2D Echocardiogram has been performed.  Marie Vaughan 12/10/2015, 3:55 PM

## 2015-12-10 NOTE — ED Notes (Addendum)
Pt appears irritable at triage, pt complaining about having to wait at this time.  Educated pt about time for labs to be completed and general wait times in the department.  Per dr Jacqualine Code since pt had EKG done pta at [facility] office pt does not need to have another ekg done at this time, advised pt of same. Pt husband with pt in triage, encouraging pt to remain calm.

## 2015-12-10 NOTE — ED Notes (Signed)
RN attempted to discharge pt after MD Edd Fabian asked me to expedite her discharge because pt was anxious to leave.   Pt informed me that she "thought she was being admitted". Informed MD Edd Fabian and pt is now being admitted.

## 2015-12-11 ENCOUNTER — Observation Stay: Payer: 59

## 2015-12-11 DIAGNOSIS — I959 Hypotension, unspecified: Secondary | ICD-10-CM

## 2015-12-11 LAB — BASIC METABOLIC PANEL
Anion gap: 7 (ref 5–15)
BUN: 10 mg/dL (ref 6–20)
CHLORIDE: 105 mmol/L (ref 101–111)
CO2: 25 mmol/L (ref 22–32)
Calcium: 9 mg/dL (ref 8.9–10.3)
Creatinine, Ser: 0.79 mg/dL (ref 0.44–1.00)
GFR calc Af Amer: >60 mL/min (ref >60)
GFR calc non Af Amer: >60 mL/min (ref >60)
GLUCOSE: 111 mg/dL — AB (ref 65–99)
POTASSIUM: 3.2 mmol/L — AB (ref 3.5–5.1)
SODIUM: 137 mmol/L (ref 135–145)

## 2015-12-11 LAB — CBC
HEMATOCRIT: 35.4 % (ref 35.0–47.0)
Hemoglobin: 11.9 g/dL — ABNORMAL LOW (ref 12.0–16.0)
MCH: 28.2 pg (ref 26.0–34.0)
MCHC: 33.6 g/dL (ref 32.0–36.0)
MCV: 84 fL (ref 80.0–100.0)
Platelets: 276 10*3/uL (ref 150–440)
RBC: 4.22 MIL/uL (ref 3.80–5.20)
RDW: 14.5 % (ref 11.5–14.5)
WBC: 8 10*3/uL (ref 3.6–11.0)

## 2015-12-11 LAB — TSH: TSH: 2.721 u[IU]/mL (ref 0.350–4.500)

## 2015-12-11 LAB — PREGNANCY, URINE: PREG TEST UR: NEGATIVE

## 2015-12-11 LAB — TROPONIN I: Troponin I: <0.03 ng/mL (ref <0.031)

## 2015-12-11 MED ORDER — POTASSIUM CHLORIDE CRYS ER 20 MEQ PO TBCR
40.0000 meq | EXTENDED_RELEASE_TABLET | ORAL | Status: AC
  Administered 2015-12-11 (×2): 40 meq via ORAL
  Filled 2015-12-11 (×2): qty 2

## 2015-12-11 MED ORDER — GADOBENATE DIMEGLUMINE 529 MG/ML IV SOLN
20.0000 mL | Freq: Once | INTRAVENOUS | Status: AC | PRN
  Administered 2015-12-11: 20 mL via INTRAVENOUS

## 2015-12-11 MED ORDER — ALPRAZOLAM 0.5 MG PO TABS: ORAL_TABLET | ORAL | Status: DC

## 2015-12-11 MED ORDER — IBUPROFEN 400 MG PO TABS
400.0000 mg | ORAL_TABLET | Freq: Once | ORAL | Status: AC
  Administered 2015-12-11: 400 mg via ORAL
  Filled 2015-12-11: qty 1

## 2015-12-11 NOTE — Consult Note (Signed)
CARDIOLOGY CONSULT NOTE     Primary Care Physician: Ashok Norris, MD Referring Physician: Gouru  Admit Date: 12/10/2015  Reason for consultation: hypotension  Marie Vaughan is a 45 y.o. female with a h/o Hypertension, GERD, and obesity who presented to the emergency room with complaint of generalized weakness and dizziness that is progressively been worsening over the last 3 weeks. She says that 3 weeks ago, she had a respiratory infection. After that infection, her blood pressure has been low. It is been down in the 70s to 80s. She does get occasional shortness of breath and chest pressure when walking. She was getting fluid boluses in the emergency room and her systolic blood pressure came up to 120s to 140s. She continues to feel quite fatigued. She has not been up and walking around very much since she has been on the floor. She says that there are times when she can be walking without any issues, and have episodes of dizziness, lightheadedness, and near syncope. She does say that she possibly passed out at work. 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   Today, she denies symptoms of palpitations, chest pain, shortness of breath, orthopnea, PND, lower extremity edema, dizziness, presyncope, syncope, or neurologic sequela. The patient is tolerating medications without difficulties and is otherwise without complaint today.   Past Medical History  Diagnosis Date  . Vaginal delivery     x 3  . Angio-edema   . HTN (hypertension)   . GERD (gastroesophageal reflux disease)   . Acne   . Anxiety   . Contact lens/glasses fitting     wears contacts or glasses  . Tear of medial meniscus of left knee   . Tear of medial meniscus of right knee   . Iron deficiency anemia secondary to blood loss (chronic)   . Migraine with aura, without mention of intractable migraine without mention of status migrainosus   . Acute bronchitis   . Pain in thoracic spine   . Pharyngitis   .  Dysmenorrhea   . Anxiety state, unspecified   . 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 Cervicalgia   . Obesity   . History of stress test     a. Lexiscan 08/25/13: no sig ischemia, attenuation artifact noted, no sig WMA noted, EF 69%, no EKG changes concerning for ischemia  . History of cardiac cath     a. 04/29/2014: LM nl, LAD no dz, D1 no dz, D2 very small vessel, D3 very small vessel, LCx nl, OM1 very small vessel, OM2 medium sized vessel, OM 3 no dz, RCA no dz, PDA no dz    Past Surgical History  Procedure Laterality Date  . Ovarian cyst removal  04-25-10  . Abdominal hysterectomy  04-25-10    Due to abnormal PAP  . Knee arthroscopy with medial menisectomy Right 06/10/2013    Procedure: RIGHT KNEE ARTHROSCOPY WITH MEDIAL MENISECTOMY;  Surgeon: Lorn Junes, MD;  Location: [place];  Service: Orthopedics;  Laterality: Right;  partial lateral menisectomoy and chondroplasty  . Knee arthroscopy Left 06/10/2013    Procedure: LEFT KNEE ARTHROSCOPY KNEE WITH PARTIAL MEDIAL MENISCECTOMY ;  Surgeon: Lorn Junes, MD;  Location: [place];  Service: Orthopedics;  Laterality: Left;  xerofoam, 4x4's, webril, ace wrap, ice wrap  . Dilation and curettage of uterus    . Cardiac catheterization  04/2014    [facility]    . ALPRAZolam  0.5 mg Oral BID  .  aspirin EC  81 mg Oral Daily  . docusate sodium  100 mg Oral BID  . enoxaparin (LOVENOX) injection  40 mg Subcutaneous Q24H  . Medical Compression Stockings  2 Units Does not apply Daily  . pantoprazole  40 mg Oral Daily   . sodium chloride 75 mL/hr at 12/11/15 1059    Allergies  Allergen Reactions  . Chocolate     Hives, itching  . Iohexol     CP and SOB  . Peanut-Containing Drug Products Swelling    cashews    Social History   Social History  . Marital Status: Single    Spouse Name: N/A  . Number of Children: 3  . Years of Education: N/A   Occupational History  . Medical records -  [employer]    Social History Main Topics  . Smoking status: Never Smoker   . Smokeless tobacco: Never Used  . Alcohol Use: No  . Drug Use: No  . Sexual Activity:    Partners: Male   Other Topics Concern  . Not on file   Social History Narrative   Lives in [place] with fiance, and 2 children 28 and 16. Works at [employer].      Regular Exercise -  Walk, 30 - 1 hr daily   Daily Caffeine Use:  1 cup coffee AM (in cold weather)             Family History  Problem Relation Age of Onset  . Early death Mother     Murdered when pt was 31 years old  . Diabetes Mother   . Hypertension Mother   . Gout Mother   . Kidney failure Mother   . Early death Sister 35    due to hemorage  . Alcohol abuse Maternal Uncle   . Alcohol abuse Other   . Arthritis Other   . Heart disease Other   . Other Other     cousin- phlebitis  . Diabetes Maternal Aunt   . Hyperlipidemia Maternal Aunt   . Hypertension Maternal Aunt   . Prostate cancer Maternal Uncle     ROS- All systems are reviewed and negative except as per the HPI above  Physical Exam: Telemetry: Filed Vitals:   12/11/15 0003 12/11/15 0004 12/11/15 0006 12/11/15 0550  BP: 106/75 122/55 119/52 111/59  Pulse: 102 102 123 88  Temp:    97.4 F (36.3 C)  TempSrc:    Oral  Resp:    20  Height:      Weight:      SpO2: 100% 93% 97% 98%    GEN- The patient is well appearing, alert and oriented x 3 today.   Head- normocephalic, atraumatic Eyes-  Sclera clear, conjunctiva pink Ears- hearing intact Oropharynx- clear Neck- supple, no JVP Lymph- no cervical lymphadenopathy Lungs- Clear to ausculation bilaterally, normal work of breathing Heart- Regular rate and rhythm, no murmurs, rubs or gallops, PMI not laterally displaced GI- soft, NT, ND, + BS Extremities- no clubbing, cyanosis, or edema MS- no significant deformity or atrophy Skin- no rash or lesion Psych- euthymic mood, full affect Neuro- strength and sensation  are intact  EKG: Sinus rhythm, rate 88  Labs:   Lab Results  Component Value Date   WBC 8.0 12/11/2015   HGB 11.9* 12/11/2015   HCT 35.4 12/11/2015   MCV 84.0 12/11/2015   PLT 276 12/11/2015    Recent Labs Lab 12/10/15 1417 12/11/15 0223  NA  --  137  K  --  3.2*  CL  --  105  CO2  --  25  BUN  --  10  CREATININE  --  0.79  CALCIUM  --  9.0  PROT 8.0  --   BILITOT 0.5  --   ALKPHOS 51  --   ALT 21  --   AST 21  --   GLUCOSE  --  111*   Lab Results  Component Value Date   TROPONINI <0.03 12/11/2015   No results found for: CHOL No results found for: HDL No results found for: LDLCALC No results found for: TRIG No results found for: CHOLHDL No results found for: LDLDIRECT    Radiology: CXR; IMPRESSION: No active cardiopulmonary disease.  CT head: IMPRESSION: 1. No acute intracranial abnormality. 2. Severe for age white matter changes which may reflect MS, a demyelinating disorder or microvascular ischemic changes related to hypertension, not significantly changed since a prior MRI from 2008.  Echo: - Left ventricle: The cavity size was normal. Systolic function was  normal. The estimated ejection fraction was in the range of 55%  to 60%. Wall motion was normal; there were no regional wall  motion abnormalities. Left ventricular diastolic function  parameters were normal. - Aortic valve: Valve area (Vmax): 2.48 cm^2. - Atrial septum: No defect or patent foramen ovale was identified.  ASSESSMENT AND PLAN:   1. Hypotension: This time, it is unclear she has a cardiac cause for her hypotension. She had an echocardiogram this admission, which shows a normal ejection fraction, no valvular abnormalities, and normal chamber sizes. She does not have evidence of diastolic dysfunction. After fluids in the emergency room, her blood pressure has come up and appears to have stabilized, though there is no blood pressure reading in the chart since 5:00 this morning.  As there is no obvious cardiac cause, I have put in for her to have a TSH and a a.m. cortisol drawn to see if there is possibly an endocrine cause for her hypotension. Although unchanged from her MRI in 2008, her CT scan possibly has evidence of changes consistent with MS. She may benefit from a neurologic evaluation.   Synia Douglass Meredith Leeds, MD 12/11/2015  12:06 PM

## 2015-12-11 NOTE — Progress Notes (Signed)
Pt. K+ is 3.2. Prime paged awaiting return call.

## 2015-12-11 NOTE — Progress Notes (Signed)
[facility] NAME: Marie Vaughan    MR#:  [id number]  DATE OF BIRTH:  1971/03/15  SUBJECTIVE:  CHIEF COMPLAINT:   Chief Complaint  Patient presents with  . Hypotension   -I potentially on admission. Improved with IV fluids. -Dizziness going on for almost 3 weeks and significant frontotemporal headaches.  REVIEW OF SYSTEMS:  Review of Systems  Constitutional: Negative for fever and chills.  HENT: Negative for ear discharge, ear pain and nosebleeds.   Eyes: Negative for blurred vision and double vision.  Respiratory: Negative for cough, shortness of breath and wheezing.   Cardiovascular: Negative for chest pain, palpitations and leg swelling.  Gastrointestinal: Negative for nausea, vomiting, abdominal pain, diarrhea and constipation.  Genitourinary: Negative for dysuria and urgency.  Musculoskeletal: Negative for myalgias, back pain and neck pain.  Neurological: Positive for dizziness and headaches. Negative for sensory change, speech change, focal weakness, seizures and weakness.  Psychiatric/Behavioral: Negative for depression.    DRUG ALLERGIES:   Allergies  Allergen Reactions  . Chocolate     Hives, itching  . Iohexol     CP and SOB  . Peanut-Containing Drug Products Swelling    cashews    VITALS:  Blood pressure 111/59, pulse 88, temperature 97.4 F (36.3 C), temperature source Oral, resp. rate 20, height 5\' 2"  (1.575 m), weight 94.802 kg (209 lb), SpO2 98 %.  PHYSICAL EXAMINATION:  Physical Exam  GENERAL:  45 y.o.-year-old patient lying in the bed with no acute distress.  EYES: Pupils equal, round, reactive to light and accommodation. No scleral icterus. Extraocular muscles intact.  HEENT: Head atraumatic, normocephalic. Oropharynx and nasopharynx clear.  NECK:  Supple, no jugular venous distention. No thyroid enlargement, no tenderness.  LUNGS: Normal breath sounds bilaterally, no wheezing, rales,rhonchi  or crepitation. No use of accessory muscles of respiration.  CARDIOVASCULAR: S1, S2 normal. No murmurs, rubs, or gallops.  ABDOMEN: Soft, nontender, nondistended. Bowel sounds present. No organomegaly or mass.  EXTREMITIES: No pedal edema, cyanosis, or clubbing.  NEUROLOGIC: Cranial nerves II through XII are intact. Muscle strength 5/5 in all extremities. Sensation intact. Gait not checked.  PSYCHIATRIC: The patient is alert and oriented x 3.  SKIN: No obvious rash, lesion, or ulcer.    LABORATORY PANEL:   CBC  Recent Labs Lab 12/11/15 0223  WBC 8.0  HGB 11.9*  HCT 35.4  PLT 276   ------------------------------------------------------------------------------------------------------------------  Chemistries   Recent Labs Lab 12/10/15 1417 12/11/15 0223  NA  --  137  K  --  3.2*  CL  --  105  CO2  --  25  GLUCOSE  --  111*  BUN  --  10  CREATININE  --  0.79  CALCIUM  --  9.0  AST 21  --   ALT 21  --   ALKPHOS 51  --   BILITOT 0.5  --    ------------------------------------------------------------------------------------------------------------------  Cardiac Enzymes  Recent Labs Lab 12/11/15 0219  TROPONINI <0.03   ------------------------------------------------------------------------------------------------------------------  RADIOLOGY:  Dg Chest 2 View  12/10/2015  CLINICAL DATA:  45 year old female with a history of dizziness EXAM: CHEST  2 VIEW COMPARISON:  10/02/2015 FINDINGS: The heart size and mediastinal contours are within normal limits. Both lungs are clear. The visualized skeletal structures are unremarkable. IMPRESSION: No active cardiopulmonary disease. Signed, Dulcy Fanny. Earleen Newport, DO [facility] Radiology Electronically Signed   By: Corrie Mckusick D.O.   On: 12/10/2015 15:18  Ct Head Wo Contrast  12/11/2015  CLINICAL DATA:  45 year old presenting with a 3 week history of dizziness, multiple falls,  noted to have hypotension. Initial encounter. EXAM: CT HEAD WITHOUT CONTRAST TECHNIQUE: Contiguous axial images were obtained from the base of the skull through the vertex without intravenous contrast. COMPARISON:  MRI brain 02/11/2007. FINDINGS: Ventricular system normal in size and appearance for age. No significant atrophy. Low attenuation in subcortical and periventricular cerebral white matter, particularly involving the parietal lobes, unchanged from the prior MRI. No mass lesion. No midline shift. No acute hemorrhage or hematoma. No extra-axial fluid collections. No evidence of acute infarction. No skull fracture or other focal osseous abnormality involving the skull. Visualized paranasal sinuses, bilateral mastoid air cells and bilateral middle ear cavities well-aerated. IMPRESSION: 1.  No acute intracranial abnormality. 2. Severe for age white matter changes which may reflect MS, a demyelinating disorder or microvascular ischemic changes related to hypertension, not significantly changed since a prior MRI from 2008. Electronically Signed   By: Evangeline Dakin M.D.   On: 12/11/2015 11:46    EKG:   Orders placed or performed during the hospital encounter of 12/10/15  . ED EKG  . ED EKG    ASSESSMENT AND PLAN:   45 year old female with past medical history significant for hypertension, GERD, anemia presents to the hospital secondary to ongoing dizziness and presyncopal episode.  #1 dizziness-more likely related to hypotension. -Improving blood pressure. Still slightly dizzy and has headaches. -CT of the head ordered which showed possible MS changes. Significant white matter changes noted. -MRI follow-up today. -No cardiac causes. Echocardiogram is normal. Cardiology consult was requested and is pending at this time  #2 hypotension-hypovolemic likely  -Home blood pressure medications on hold. Patient already held them almost 3 weeks ago since her symptoms started. -Check orthostatics and  ambulate and monitor  #3 GERD-Protonix  #4 DVT prophylaxis-Lovenox   Possible discharge today if MRI is negative   All the records are reviewed and case discussed with Care Management/Social Workerr. Management plans discussed with the patient, family and they are in agreement.  CODE STATUS: Full code  TOTAL TIME TAKING CARE OF THIS PATIENT: 37 minutes.   POSSIBLE D/C IN 1-2 DAYS, DEPENDING ON CLINICAL CONDITION.   Gladstone Lighter M.D on 12/11/2015 at 12:14 PM  Between 7am to 6pm - Pager - [phone]  After 6pm go to [email or web address] - password [email or web address] [facility]  Office  [phone]  CC: Primary care physician; Ashok Norris, MD

## 2015-12-11 NOTE — Progress Notes (Signed)
Dr. Annie Main call for K+, replacement K+ ordered.

## 2015-12-11 NOTE — Evaluation (Signed)
Physical Therapy Evaluation Patient Details Name: Marie Vaughan MRN: [id number] DOB: 01-Jan-1971 Today's Date: 12/11/2015   History of Present Illness  Pt is a 45 y.o. female presenting to hospital with weakness and dizziness x3 weeks.  Pt sent from MD office d/t hypotension.  Pt reporting occasional sharp stabbing chest pains, SOB with exertion, and chest pressure sometimes with ambulation.  Pt admitted with near syncope probably from hypotension.  PMH includes tear B medial meniscus.  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  Pt lives with her husband in 1 level home with step to enter.  Currently pt is CGA with transfers and CGA to min to mod assist with ambulation with B UE support on IV pole (pt reporting increased dizziness with distance ambulated and intermittently with lateral loss of balance requiring assist to steady for safety; nursing notified).  Pt would benefit from skilled PT to address noted impairments and functional limitations.  Pt's balance impairments currently appear to be d/t dizziness.  Anticipate once pt's dizziness improves, pt will be able to discharge back home with support of family (may need OP PT pending pt's progress and symptom improvement).     Follow Up Recommendations  (OP PT pending pt's progress)    Equipment Recommendations   (TBD)    Recommendations for Other Services       Precautions / Restrictions Precautions Precautions: Fall Restrictions Weight Bearing Restrictions: No      Mobility  Bed Mobility Overal bed mobility: Modified Independent             General bed mobility comments: Sit to supine with HOB elevated  Transfers Overall transfer level: Needs assistance Equipment used: None Transfers: Sit to/from Stand Sit to Stand: Min guard         General transfer comment: steady without loss of balance  Ambulation/Gait Ambulation/Gait assistance: Min guard;Min  assist;Mod assist Ambulation Distance (Feet): 150 Feet Assistive device:  (B UE's on IV pole)   Gait velocity: decreased   General Gait Details: decreased B step length/foot clearance/heelstrike; increasing unsteadiness and c/o dizziness with distance ambulated (PT offerred to get pt a chair but pt declined); intermittent loss of balance requiring min to mod assist to steady  Science writer    Modified Rankin (Stroke Patients Only)       Balance Overall balance assessment: Needs assistance Sitting-balance support: Bilateral upper extremity supported;Feet supported Sitting balance-Leahy Scale: Normal     Standing balance support: Bilateral upper extremity supported;During functional activity (on IV pole)   Standing balance comment: varied between CGA to min to mod assist depending on dizziness (increased lateral loss of balance)                             Pertinent Vitals/Pain Pain Assessment: 0-10 Pain Score: 5  Pain Location: headache Pain Descriptors / Indicators: Headache Pain Intervention(s): Limited activity within patient's tolerance;Monitored during session;Premedicated before session;Repositioned    Home Living Family/patient expects to be discharged to:: Private residence Living Arrangements: Spouse/significant other Available Help at Discharge: Family Type of Home: House Home Access: Stairs to enter Entrance Stairs-Rails: None Entrance Stairs-Number of Steps: 1 Home Layout: One level Home Equipment: None      Prior Function Level of Independence: Independent  Hand Dominance        Extremity/Trunk Assessment   Upper Extremity Assessment: Overall WFL for tasks assessed           Lower Extremity Assessment: Overall WFL for tasks assessed         Communication   Communication: No difficulties  Cognition Arousal/Alertness: Awake/alert Behavior During Therapy: WFL for tasks  assessed/performed Overall Cognitive Status: Within Functional Limits for tasks assessed                      General Comments   Nursing cleared pt for participation in physical therapy.  Pt agreeable to PT session.    Exercises        Assessment/Plan    PT Assessment Patient needs continued PT services  PT Diagnosis Difficulty walking;Abnormality of gait   PT Problem List Decreased balance;Decreased mobility  PT Treatment Interventions DME instruction;Gait training;Stair training;Functional mobility training;Therapeutic activities;Therapeutic exercise;Balance training;Patient/family education   PT Goals (Current goals can be found in the Care Plan section) Acute Rehab PT Goals Patient Stated Goal: to improve steadiness with ambulation PT Goal Formulation: With patient Time For Goal Achievement: 12/25/15 Potential to Achieve Goals: Good    Frequency Min 2X/week   Barriers to discharge        Co-evaluation               End of Session Equipment Utilized During Treatment: Gait belt Activity Tolerance:  (Limited d/t dizziness with activity) Patient left: in bed;with call bell/phone within reach;with bed alarm set;with family/visitor present Nurse Communication: Mobility status;Precautions (Dizziness with distance ambulated, BP post ambulation, balance impairements d/t dizziness)    Functional Assessment Tool Used: AM-PAC without stairs Functional Limitation: Mobility: Walking and moving around Mobility: Walking and Moving Around Current Status [id number]): At least 40 percent but less than 60 percent impaired, limited or restricted Mobility: Walking and Moving Around Goal Status [phone]): 0 percent impaired, limited or restricted    Time: 1455-1517 PT Time Calculation (min) (ACUTE ONLY): 22 min   Charges:   PT Evaluation $PT Eval Moderate Complexity: 1 Procedure     PT G Codes:   PT G-Codes **NOT FOR INPATIENT CLASS** Functional Assessment Tool Used:  AM-PAC without stairs Functional Limitation: Mobility: Walking and moving around Mobility: Walking and Moving Around Current Status [id number]): At least 40 percent but less than 60 percent impaired, limited or restricted Mobility: Walking and Moving Around Goal Status [phone]): 0 percent impaired, limited or restricted    Leitha Bleak 12/11/2015, 3:54 PM Leitha Bleak, [place]

## 2015-12-11 NOTE — Progress Notes (Signed)
Pt. Arrived via stretcher to unit. She walked from stretcher to bed with stand by assist. She is alert and oriented. Tele applied, NSR, Box verified with Andee Poles, CNA. No skin issues noted. Skin is warm and dry. Second nurse verifying, Ernst Breach, RN. General room orientation given. How to use ascoms and call bell also given. Multiple family and friends at bedside. Pt. Joking and laughing with family and friends. Son brought outside food for pt. IV started NS at 81ml/hour. Complaints of head aching, pt. Treated for pain prior to arrival to floor. Will continue to monitor.

## 2015-12-12 ENCOUNTER — Observation Stay: Payer: 59

## 2015-12-12 DIAGNOSIS — R42 Dizziness and giddiness: Secondary | ICD-10-CM

## 2015-12-12 DIAGNOSIS — R55 Syncope and collapse: Secondary | ICD-10-CM

## 2015-12-12 LAB — PROTIME-INR
INR: 1.07
PROTHROMBIN TIME: 14.1 s (ref 11.4–15.0)

## 2015-12-12 LAB — SEDIMENTATION RATE: Sed Rate: 41 mm/hr — ABNORMAL HIGH (ref 0–20)

## 2015-12-12 LAB — C-REACTIVE PROTEIN: CRP: 0.8 mg/dL (ref <1.0)

## 2015-12-12 LAB — APTT: aPTT: 30 seconds (ref 24–36)

## 2015-12-12 LAB — CORTISOL-AM, BLOOD: CORTISOL - AM: 15.1 ug/dL (ref 6.7–22.6)

## 2015-12-12 LAB — VITAMIN B12: VITAMIN B 12: 312 pg/mL (ref 180–914)

## 2015-12-12 LAB — TSH: TSH: 0.969 u[IU]/mL (ref 0.350–4.500)

## 2015-12-12 MED ORDER — MECLIZINE HCL 25 MG PO TABS
25.0000 mg | ORAL_TABLET | Freq: Three times a day (TID) | ORAL | Status: DC | PRN
  Administered 2015-12-12: 25 mg via ORAL
  Filled 2015-12-12: qty 1

## 2015-12-12 MED ORDER — IBUPROFEN 400 MG PO TABS: 400.0000 mg | ORAL_TABLET | Freq: Four times a day (QID) | ORAL | Status: DC | PRN

## 2015-12-12 MED ORDER — TRAMADOL HCL 50 MG PO TABS
50.0000 mg | ORAL_TABLET | Freq: Four times a day (QID) | ORAL | Status: DC | PRN
  Administered 2015-12-12 – 2015-12-15 (×8): 50 mg via ORAL
  Filled 2015-12-12 (×8): qty 1

## 2015-12-12 MED ORDER — MORPHINE SULFATE (PF) 2 MG/ML IV SOLN
2.0000 mg | INTRAVENOUS | Status: DC | PRN
Start: 2015-12-12 — End: 2015-12-15
  Administered 2015-12-12: 2 mg via INTRAVENOUS
  Filled 2015-12-12: qty 1

## 2015-12-12 NOTE — Progress Notes (Signed)
Patient has been alert and oriented. Complaining of dizziness and headache. Given meclizine PRN with no improvement per the patient. States if she stands still she does okay, but when she walks she is still dizzy. Patient has refused alarm today, family has been at bedside all day and she was instructed to call when she gets up. Night shift gave tylenol and morphine for headache last night and she still had the headache this AM, but did not want anymore medication. Her head started getting better until this evening and she requested medication. Tramadol given. Vitals are stable and orthostatics are negative.

## 2015-12-12 NOTE — Progress Notes (Signed)
   SUBJECTIVE:  She presented with presyncope and hypotension. Blood pressure has improved with IV fluids and she is currently feeling better. She continues to complain of intermittent headache and she was noted to have poor balance with walking yesterday.   Filed Vitals:   12/12/15 [id number] 12/12/15 0610 12/12/15 0612 12/12/15 0810  BP: 105/63 104/64 104/69 101/57  Pulse: 93 85 94 80  Temp: 98.3 F (36.8 C)   98.1 F (36.7 C)  TempSrc: Oral   Oral  Resp: 22   20  Height:      Weight:      SpO2: 95%   91%    Intake/Output Summary (Last 24 hours) at 12/12/15 0834 Last data filed at 12/12/15 0135  Gross per 24 hour  Intake    240 ml  Output    800 ml  Net   -560 ml    LABS: Basic Metabolic Panel:  Recent Labs  12/10/15 1135 12/11/15 0223  NA 136 137  K 3.3* 3.2*  CL 103 105  CO2 25 25  GLUCOSE 89 111*  BUN 12 10  CREATININE 0.76 0.79  CALCIUM 9.9 9.0   Liver Function Tests:  Recent Labs  12/10/15 1417  AST 21  ALT 21  ALKPHOS 51  BILITOT 0.5  PROT 8.0  ALBUMIN 4.3   No results for input(s): LIPASE, AMYLASE in the last 72 hours. CBC:  Recent Labs  12/10/15 1135 12/11/15 0223  WBC 8.1 8.0  HGB 12.7 11.9*  HCT 37.7 35.4  MCV 83.6 84.0  PLT 301 276   Cardiac Enzymes:  Recent Labs  12/10/15 1417 12/10/15 1953 12/11/15 0219  TROPONINI <0.03 <0.03 <0.03   BNP: Invalid input(s): POCBNP D-Dimer: No results for input(s): DDIMER in the last 72 hours. Hemoglobin A1C: No results for input(s): HGBA1C in the last 72 hours. Fasting Lipid Panel: No results for input(s): CHOL, HDL, LDLCALC, TRIG, CHOLHDL, LDLDIRECT in the last 72 hours. Thyroid Function Tests:  Recent Labs  12/12/15 0559  TSH 0.969   Anemia Panel: No results for input(s): VITAMINB12, FOLATE, FERRITIN, TIBC, IRON, RETICCTPCT in the last 72 hours.   PHYSICAL EXAM General: Well developed, well nourished, in no acute distress HEENT:  Normocephalic and atramatic Neck:  No JVD.    Lungs: Clear bilaterally to auscultation and percussion. Heart: HRRR . Normal S1 and S2 without gallops or murmurs.  Abdomen: Bowel sounds are positive, abdomen soft and non-tender  Msk:  Back normal, normal gait. Normal strength and tone for age. Extremities: No clubbing, cyanosis or edema.   Neuro: Alert and oriented X 3. Psych:  Good affect, responds appropriately  TELEMETRY: Reviewed telemetry pt in normal sinus rhythm without evidence of arrhythmia:  ASSESSMENT AND PLAN:  1. Hypotension:unclear etiology but does not seem to be cardiac. EKG was normal and echocardiogram was unremarkable. TSH was normal. I agree with checking cortisol level. Blood pressure readings are within normal range over the weekend.  2. Headache with poor balance: abnormal brain MRI which showed extensive white matter abnormality. I recommend neurology consultation.     Kathlyn Sacramento, MD, [facility] 12/12/2015 8:34 AM

## 2015-12-12 NOTE — Consult Note (Signed)
Reason for Consult:Dizziness Referring Physician: Tressia Miners  CC: Dizziness  HPI: Marie Vaughan is an 45 y.o. female who presented with complaints of generalized weakness and dizziness that has been present for the past 3-4 months. Reports she has had intermittent dizziness for quite some time but it has been worse for the past 3-4 months.  Reports feeling off balance.  Can be dizzy at any time, even from just moving her head.  Also reports headaches.  Headaches have been present for many years abut again for the past 3-4 months have been severe.  They are frontal and around the eyes.  Described as a pressure.  Does not report an aura.   Reports no focal complaints at this time but has had periods when one side or the other will become numb/weak for a few days.  Has had a period of urinary retention that resolved spontaneously and has had times when she is unable to hold her urine.  All of these episodes resolved spontaneously although she did require catheterization for her urinary retention before it resolved.    Past Medical History  Diagnosis Date  . Vaginal delivery     x 3  . Angio-edema   . HTN (hypertension)   . GERD (gastroesophageal reflux disease)   . Acne   . Anxiety   . Contact lens/glasses fitting     wears contacts or glasses  . Tear of medial meniscus of left knee   . Tear of medial meniscus of right knee   . Iron deficiency anemia secondary to blood loss (chronic)   . Migraine with aura, without mention of intractable migraine without mention of status migrainosus   . Acute bronchitis   . Pain in thoracic spine   . Pharyngitis   . Dysmenorrhea   . Anxiety state, unspecified   . 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 Cervicalgia   . Obesity   . History of stress test     a. Lexiscan 08/25/13: no sig ischemia, attenuation artifact noted, no sig WMA noted, EF 69%, no EKG changes concerning for ischemia  . History of cardiac cath     a. 04/29/2014: LM nl, LAD no  dz, D1 no dz, D2 very small vessel, D3 very small vessel, LCx nl, OM1 very small vessel, OM2 medium sized vessel, OM 3 no dz, RCA no dz, PDA no dz     Past Surgical History  Procedure Laterality Date  . Ovarian cyst removal  04-25-10  . Abdominal hysterectomy  04-25-10    Due to abnormal PAP  . Knee arthroscopy with medial menisectomy Right 06/10/2013    Procedure: RIGHT KNEE ARTHROSCOPY WITH MEDIAL MENISECTOMY;  Surgeon: Lorn Junes, MD;  Location: [place];  Service: Orthopedics;  Laterality: Right;  partial lateral menisectomoy and chondroplasty  . Knee arthroscopy Left 06/10/2013    Procedure: LEFT KNEE ARTHROSCOPY KNEE WITH PARTIAL MEDIAL MENISCECTOMY ;  Surgeon: Lorn Junes, MD;  Location: [place];  Service: Orthopedics;  Laterality: Left;  xerofoam, 4x4's, webril, ace wrap, ice wrap  . Dilation and curettage of uterus    . Cardiac catheterization  04/2014    [facility]    Family History  Problem Relation Age of Onset  . Early death Mother     Murdered when pt was 48 years old  . Diabetes Mother   . Hypertension Mother   . Gout Mother   . Kidney failure Mother   . Early death  Sister 22    due to hemorage  . Alcohol abuse Maternal Uncle   . Alcohol abuse Other   . Arthritis Other   . Heart disease Other   . Other Other     cousin- phlebitis  . Diabetes Maternal Aunt   . Hyperlipidemia Maternal Aunt   . Hypertension Maternal Aunt   . Prostate cancer Maternal Uncle     Social History:  reports that she has never smoked. She has never used smokeless tobacco. She reports that she does not drink alcohol or use illicit drugs.  Allergies  Allergen Reactions  . Chocolate     Hives, itching  . Iohexol     CP and SOB  . Peanut-Containing Drug Products Swelling    cashews    Medications:  I have reviewed the patient's current medications. Prior to Admission:  Prescriptions prior to admission  Medication Sig Dispense Refill Last Dose   . albuterol (PROVENTIL HFA;VENTOLIN HFA) 108 (90 Base) MCG/ACT inhaler Inhale 2 puffs into the lungs every 6 (six) hours as needed for wheezing or shortness of breath. 1 Inhaler 0 Past Month at Unknown time  . aspirin EC 81 MG tablet Take 1 tablet (81 mg total) by mouth daily. 90 tablet 3 Past Month at Unknown time  . chlorpheniramine-HYDROcodone (TUSSIONEX PENNKINETIC ER) 10-8 MG/5ML SUER Take 5 mLs by mouth every 12 (twelve) hours as needed for cough. 150 mL 0 Past Month at Unknown time  . Elastic Bandages & Supports (MEDICAL COMPRESSION STOCKINGS) MISC 2 Units by Does not apply route daily. 2 each 0 Past Month at Unknown time  . esomeprazole (NEXIUM) 40 MG capsule Take 1 capsule (40 mg total) by mouth daily before breakfast.   Past Month at Unknown time  . olmesartan-hydrochlorothiazide (BENICAR HCT) 40-25 MG tablet Take 1 tablet by mouth daily.  6 11/19/2015  . [DISCONTINUED] ALPRAZolam (XANAX) 0.5 MG tablet TAKE 1 TABLET BY MOUTH TWICE DAILY (Patient taking differently: TAKE 1 TABLET BY MOUTH TWICE DAILY AS NEEDED FOR ANXIETY.) 30 tablet 5 Past Month at Unknown time  . cefdinir (OMNICEF) 300 MG capsule Take 1 capsule (300 mg total) by mouth 2 (two) times daily. 20 capsule 0 Taking   Scheduled: . ALPRAZolam  0.5 mg Oral BID  . aspirin EC  81 mg Oral Daily  . docusate sodium  100 mg Oral BID  . Medical Compression Stockings  2 Units Does not apply Daily  . pantoprazole  40 mg Oral Daily    ROS: History obtained from the patient  General ROS: negative for - chills, fatigue, fever, night sweats, weight gain or weight loss Psychological ROS: negative for - behavioral disorder, hallucinations, memory difficulties, mood swings or suicidal ideation Ophthalmic ROS: negative for - blurry vision, double vision, eye pain or loss of vision ENT ROS: negative for - epistaxis, nasal discharge, oral lesions, sore throat, tinnitus  Allergy and Immunology ROS: negative for - hives or itchy/watery  eyes Hematological and Lymphatic ROS: negative for - bleeding problems, bruising or swollen lymph nodes Endocrine ROS: negative for - galactorrhea, hair pattern changes, polydipsia/polyuria or temperature intolerance Respiratory ROS: shortness of breath Cardiovascular ROS: chest pain Gastrointestinal ROS: negative for - abdominal pain, diarrhea, hematemesis, nausea/vomiting or stool incontinence Genito-Urinary ROS: negative for - dysuria, hematuria, incontinence or urinary frequency/urgency Musculoskeletal ROS: negative for - joint swelling or muscular weakness Neurological ROS: as noted in HPI Dermatological ROS: negative for rash and skin lesion changes  Physical Examination: Blood pressure 118/71, pulse 87,  temperature 98 F (36.7 C), temperature source Axillary, resp. rate 22, height '5\' 2"'  (1.575 m), weight 94.802 kg (209 lb), SpO2 97 %.  HEENT-  Normocephalic, no lesions, without obvious abnormality.  Normal external eye and conjunctiva.  Normal TM's bilaterally.  Normal auditory canals and external ears. Normal external nose, mucus membranes and septum.  Normal pharynx. Cardiovascular- S1, S2 normal, pulses palpable throughout   Lungs- chest clear, no wheezing, rales, normal symmetric air entry Abdomen- soft, non-tender; bowel sounds normal; no masses,  no organomegaly Extremities- no edema Lymph-no adenopathy palpable Musculoskeletal-no joint tenderness, deformity or swelling Skin-warm and dry, no hyperpigmentation, vitiligo, or suspicious lesions  Neurological Examination Mental Status: Alert, oriented, thought content appropriate.  Speech fluent without evidence of aphasia.  Able to follow 3 step commands without difficulty. Cranial Nerves: II: Discs flat bilaterally; Visual fields grossly normal, pupils equal, round, reactive to light and accommodation III,IV, VI: ptosis not present, extra-ocular motions intact bilaterally V,VII: smile symmetric, facial light touch sensation  normal bilaterally VIII: hearing normal bilaterally IX,X: gag reflex present XI: bilateral shoulder shrug XII: midline tongue extension Motor: Gives very little effort but able to give effort against gravity and has no focal weakness Sensory: Pinprick and light touch intact throughout, bilaterally Deep Tendon Reflexes: 3+ and symmetric throughout Plantars: Right: downgoing   Left: downgoing Cerebellar: Normal finger-to-nose and normal heel-to-shin testing bilaterally Gait: Able to stand and take a few steps without assistance   Laboratory Studies:   Basic Metabolic Panel:  Recent Labs Lab 12/07/15 0844 12/10/15 1135 12/11/15 0223  NA 141 136 137  K 4.0 3.3* 3.2*  CL 103 103 105  CO2 '23 25 25  ' GLUCOSE 93 89 111*  BUN '10 12 10  ' CREATININE 0.77 0.76 0.79  CALCIUM 9.2 9.9 9.0    Liver Function Tests:  Recent Labs Lab 12/07/15 0844 12/10/15 1417  AST 14 21  ALT 14 21  ALKPHOS 57 51  BILITOT <0.2 0.5  PROT 6.3 8.0  ALBUMIN 3.9 4.3   No results for input(s): LIPASE, AMYLASE in the last 168 hours. No results for input(s): AMMONIA in the last 168 hours.  CBC:  Recent Labs Lab 12/07/15 0844 12/10/15 1135 12/11/15 0223  WBC 6.5 8.1 8.0  NEUTROABS 4.0  --   --   HGB  --  12.7 11.9*  HCT 33.9* 37.7 35.4  MCV 85 83.6 84.0  PLT 299 301 276    Cardiac Enzymes:  Recent Labs Lab 12/10/15 1417 12/10/15 1953 12/11/15 0219  TROPONINI <0.03 <0.03 <0.03    BNP: Invalid input(s): POCBNP  CBG:  Recent Labs Lab 12/10/15 1300  GLUCAP 104*    Microbiology: Results for orders placed or performed during the hospital encounter of 04/22/15  Urine culture     Status: None   Collection Time: 04/22/15  8:01 PM  Result Value Ref Range Status   Specimen Description URINE, CLEAN CATCH  Final   Special Requests NONE  Final   Culture MULTIPLE SPECIES PRESENT, SUGGEST RECOLLECTION  Final   Report Status 04/25/2015 FINAL  Final    Coagulation Studies: No results  for input(s): LABPROT, INR in the last 72 hours.  Urinalysis:  Recent Labs Lab 12/10/15 1135  COLORURINE YELLOW*  LABSPEC 1.015  PHURINE 6.0  GLUCOSEU NEGATIVE  HGBUR NEGATIVE  BILIRUBINUR NEGATIVE  KETONESUR NEGATIVE  PROTEINUR NEGATIVE  NITRITE NEGATIVE  LEUKOCYTESUR NEGATIVE    Lipid Panel:  No results found for: CHOL, TRIG, HDL, CHOLHDL, VLDL, LDLCALC  HgbA1C:  Lab Results  Component Value Date   HGBA1C 5.3 04/20/2015    Urine Drug Screen:  No results found for: LABOPIA, COCAINSCRNUR, LABBENZ, AMPHETMU, THCU, LABBARB  Alcohol Level: No results for input(s): ETH in the last 168 hours.  Other results: EKG: normal sinus rhythm at 87 bpm.  Imaging: Dg Chest 2 View  12/10/2015  CLINICAL DATA:  45 year old female with a history of dizziness EXAM: CHEST  2 VIEW COMPARISON:  10/02/2015 FINDINGS: The heart size and mediastinal contours are within normal limits. Both lungs are clear. The visualized skeletal structures are unremarkable. IMPRESSION: No active cardiopulmonary disease. Signed, Dulcy Fanny. Earleen Newport, DO [facility] Electronically Signed   By: Corrie Mckusick D.O.   On: 12/10/2015 15:18   Ct Head Wo Contrast  12/11/2015  CLINICAL DATA:  45 year old presenting with a 3 week history of dizziness, multiple falls, noted to have hypotension. Initial encounter. EXAM: CT HEAD WITHOUT CONTRAST TECHNIQUE: Contiguous axial images were obtained from the base of the skull through the vertex without intravenous contrast. COMPARISON:  MRI brain 02/11/2007. FINDINGS: Ventricular system normal in size and appearance for age. No significant atrophy. Low attenuation in subcortical and periventricular cerebral white matter, particularly involving the parietal lobes, unchanged from the prior MRI. No mass lesion. No midline shift. No acute hemorrhage or hematoma. No extra-axial fluid collections. No evidence of acute infarction. No skull  fracture or other focal osseous abnormality involving the skull. Visualized paranasal sinuses, bilateral mastoid air cells and bilateral middle ear cavities well-aerated. IMPRESSION: 1.  No acute intracranial abnormality. 2. Severe for age white matter changes which may reflect MS, a demyelinating disorder or microvascular ischemic changes related to hypertension, not significantly changed since a prior MRI from 2008. Electronically Signed   By: Evangeline Dakin M.D.   On: 12/11/2015 11:46   Mr Jeri Cos BM Contrast  12/11/2015  CLINICAL DATA:  Headache. Generalized weakness associated with dizziness. Hypotension earlier today. History of hypertension. EXAM: MRI HEAD WITHOUT AND WITH CONTRAST TECHNIQUE: Multiplanar, multiecho pulse sequences of the brain and surrounding structures were obtained without and with intravenous contrast. CONTRAST:  10m MULTIHANCE GADOBENATE DIMEGLUMINE 529 MG/ML IV SOLN COMPARISON:  Head CT 12/11/2015 and MRI 02/11/2007 FINDINGS: There is no evidence of acute infarct, intracranial hemorrhage, mass, midline shift, or extra-axial fluid collection. Ventricles and sulci are normal for age. Patchy T2 hyperintensities are again seen throughout the cerebral white matter bilaterally, with more confluent signal abnormality in the periatrial regions. Cerebral white matter disease has mildly progressed from 2008. No significant involvement of the corpus callosum, brainstem, or cerebellum is identified. No enhancing lesions are seen. Orbits are unremarkable. There is a small right maxillary sinus mucous retention cyst. The mastoid air cells are clear. Major intracranial vascular flow voids are preserved. IMPRESSION: 1. No acute intracranial abnormality. 2. 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 Considerations include advanced chronic small vessel ischemia, hypercoagulable state, vasculitis, prior inflammation/ infection, and demyelination.  Electronically Signed   By: ALogan BoresM.D.   On: 12/11/2015 18:02     Assessment/Plan: 45year old female with multiple medical problems who presents with complaints of generalized weakness and dizziness that has been present for months.  MRI of the brain personally reviewed and shows an extensive amount of white matter disease. No acute changes and no enhancement.  Although this can be seen with migraine it does appear to be out of proportion to what would  be expected with migraine.  She does have other complaints form her history such as focal weakness/numbness/urinary retention that may point to a demyelinating disease.  Doubt related to current complaints of dizziness.  Recommendations: 1.  MRA of the brain 2.  LP in AM.  Will have Lovenox discontinued.  SCD's placed. 3.  ANA, ESR, CRP, lyme titer, PT/PTT, INR, homocysteine, B1  Alexis Goodell, MD Neurology [phone] 12/12/2015, 12:54 PM

## 2015-12-12 NOTE — Care Management (Signed)
Admitted with presyncope and hypotension. Independent, active, works and drives. Lives at home with boyfriend and children. [employer] insurance. No needs from CM anticipated. If OP PT needed, please have MD place referral.

## 2015-12-12 NOTE — Progress Notes (Signed)
Patient c/o of a HA this morning 10/10. Patient only wanted tylenol. Patient stated she has been waking up to a Headache every morning. Patient stated that she needed to use the bathroom. Nurse noticed that patient was very unsteady on feet. Nurse walked patient into bath room and ambulated patient back to bed.

## 2015-12-12 NOTE — Progress Notes (Addendum)
[facility] [place] NAME: Marie Vaughan    MR#:  [id number]  DATE OF BIRTH:  [birth year]  SUBJECTIVE:  CHIEF COMPLAINT:   Chief Complaint  Patient presents with  . Hypotension   - Hypotension- improved with IV fluids - still feels dizzy on standing and also early morning severe headaches -MRI with abnormal white matter changes. Pending neurology consult  REVIEW OF SYSTEMS:  Review of Systems  Constitutional: Negative for fever and chills.  HENT: Negative for ear discharge, ear pain and nosebleeds.   Eyes: Negative for blurred vision and double vision.  Respiratory: Negative for cough, shortness of breath and wheezing.   Cardiovascular: Negative for chest pain, palpitations and leg swelling.  Gastrointestinal: Negative for nausea, vomiting, abdominal pain, diarrhea and constipation.  Genitourinary: Negative for dysuria and urgency.  Musculoskeletal: Negative for myalgias, back pain and neck pain.  Neurological: Positive for dizziness and headaches. Negative for sensory change, speech change, focal weakness, seizures and weakness.  Psychiatric/Behavioral: Negative for depression.    DRUG ALLERGIES:   Allergies  Allergen Reactions  . Chocolate     Hives, itching  . Iohexol     CP and SOB  . Peanut-Containing Drug Products Swelling    cashews    VITALS:  Blood pressure 101/57, pulse 80, temperature 98.1 F (36.7 C), temperature source Oral, resp. rate 20, height 5' 2" (1.575 m), weight 94.802 kg (209 lb), SpO2 91 %.  PHYSICAL EXAMINATION:  Physical Exam  GENERAL:  45 y.o.-year-old patient lying in the bed with no acute distress.  EYES: Pupils equal, round, reactive to light and accommodation. No scleral icterus. Extraocular muscles intact.  HEENT: Head atraumatic, normocephalic. Oropharynx and nasopharynx clear.  NECK:  Supple, no jugular venous distention. No thyroid enlargement, no tenderness.  LUNGS: Normal breath  sounds bilaterally, no wheezing, rales,rhonchi or crepitation. No use of accessory muscles of respiration.  CARDIOVASCULAR: S1, S2 normal. No murmurs, rubs, or gallops.  ABDOMEN: Soft, nontender, nondistended. Bowel sounds present. No organomegaly or mass.  EXTREMITIES: No pedal edema, cyanosis, or clubbing.  NEUROLOGIC: Cranial nerves II through XII are intact. Muscle strength 5/5 in all extremities. Sensation intact. Gait not checked.  PSYCHIATRIC: The patient is alert and oriented x 3.  SKIN: No obvious rash, lesion, or ulcer.    LABORATORY PANEL:   CBC  Recent Labs Lab 12/11/15 0223  WBC 8.0  HGB 11.9*  HCT 35.4  PLT 276   ------------------------------------------------------------------------------------------------------------------  Chemistries   Recent Labs Lab 12/10/15 1417 12/11/15 0223  NA  --  137  K  --  3.2*  CL  --  105  CO2  --  25  GLUCOSE  --  111*  BUN  --  10  CREATININE  --  0.79  CALCIUM  --  9.0  AST 21  --   ALT 21  --   ALKPHOS 51  --   BILITOT 0.5  --    ------------------------------------------------------------------------------------------------------------------  Cardiac Enzymes  Recent Labs Lab 12/11/15 0219  TROPONINI <0.03   ------------------------------------------------------------------------------------------------------------------  RADIOLOGY:  Dg Chest 2 View  12/10/2015  CLINICAL DATA:  45 year old female with a history of dizziness EXAM: CHEST  2 VIEW COMPARISON:  10/02/2015 FINDINGS: The heart size and mediastinal contours are within normal limits. Both lungs are clear. The visualized skeletal structures are unremarkable. IMPRESSION: No active cardiopulmonary disease. Signed, Dulcy Fanny. Earleen Newport, DO [facility] Radiology Electronically Signed   By: York Cerise  Earleen Newport D.O.   On: 12/10/2015 15:18   Ct Head Wo Contrast  12/11/2015  CLINICAL DATA:  45 year old presenting with a 3  week history of dizziness, multiple falls, noted to have hypotension. Initial encounter. EXAM: CT HEAD WITHOUT CONTRAST TECHNIQUE: Contiguous axial images were obtained from the base of the skull through the vertex without intravenous contrast. COMPARISON:  MRI brain 02/11/2007. FINDINGS: Ventricular system normal in size and appearance for age. No significant atrophy. Low attenuation in subcortical and periventricular cerebral white matter, particularly involving the parietal lobes, unchanged from the prior MRI. No mass lesion. No midline shift. No acute hemorrhage or hematoma. No extra-axial fluid collections. No evidence of acute infarction. No skull fracture or other focal osseous abnormality involving the skull. Visualized paranasal sinuses, bilateral mastoid air cells and bilateral middle ear cavities well-aerated. IMPRESSION: 1.  No acute intracranial abnormality. 2. Severe for age white matter changes which may reflect MS, a demyelinating disorder or microvascular ischemic changes related to hypertension, not significantly changed since a prior MRI from 2008. Electronically Signed   By: Evangeline Dakin M.D.   On: 12/11/2015 11:46   Mr Jeri Cos XL Contrast  12/11/2015  CLINICAL DATA:  Headache. Generalized weakness associated with dizziness. Hypotension earlier today. History of hypertension. EXAM: MRI HEAD WITHOUT AND WITH CONTRAST TECHNIQUE: Multiplanar, multiecho pulse sequences of the brain and surrounding structures were obtained without and with intravenous contrast. CONTRAST:  56m MULTIHANCE GADOBENATE DIMEGLUMINE 529 MG/ML IV SOLN COMPARISON:  Head CT 12/11/2015 and MRI 02/11/2007 FINDINGS: There is no evidence of acute infarct, intracranial hemorrhage, mass, midline shift, or extra-axial fluid collection. Ventricles and sulci are normal for age. Patchy T2 hyperintensities are again seen throughout the cerebral white matter bilaterally, with more confluent signal abnormality in the periatrial  regions. Cerebral white matter disease has mildly progressed from 2008. No significant involvement of the corpus callosum, brainstem, or cerebellum is identified. No enhancing lesions are seen. Orbits are unremarkable. There is a small right maxillary sinus mucous retention cyst. The mastoid air cells are clear. Major intracranial vascular flow voids are preserved. IMPRESSION: 1. No acute intracranial abnormality. 2. 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 Considerations include advanced chronic small vessel ischemia, hypercoagulable state, vasculitis, prior inflammation/ infection, and demyelination. Electronically Signed   By: ALogan BoresM.D.   On: 12/11/2015 18:02    EKG:   Orders placed or performed during the hospital encounter of 12/10/15  . ED EKG  . ED EKG    ASSESSMENT AND PLAN:   45year old female with past medical history significant for hypertension, GERD, anemia presents to the hospital secondary to ongoing dizziness and presyncopal episode.  #1 Dizziness-Initially thought to be secondary to hypotension. Still has dizziness in spite of improving blood pressure. -MRI of the brain with white matter changes. Neurology consult is pending. -Agree with CT angio of head and neck. -Also  having daily morning headaches -B12, homocysteine, CRP, ESR and ANA tests are pending -Meclizine when necessary for symptomatic dizziness -No cardiac causes identified. Echo normal. Appreciate cardiology consult  #2 hypotension-hypovolemic likely  -Home blood pressure medications on hold. Patient already held them almost 3 weeks ago since her symptoms started. -Improved with IV fluids. No orthostatic changes. Continue to hold blood pressure medicines  #3 GERD-Protonix  #4 DVT prophylaxis-Lovenox   Physical therapy consult   All the records are reviewed and case discussed with Care Management/Social Workerr. Management plans discussed  with the patient, family  and they are in agreement.  CODE STATUS: Full code  TOTAL TIME TAKING CARE OF THIS PATIENT: 37 minutes.   POSSIBLE D/C IN 1-2 DAYS, DEPENDING ON CLINICAL CONDITION.   Gladstone Lighter M.D on 12/12/2015 at 12:26 PM  Between 7am to 6pm - Pager - [phone]  After 6pm go to [email or web address] - password [email or web address]  Office  [phone]  CC: Primary care physician; Ashok Norris, MD

## 2015-12-12 NOTE — Progress Notes (Signed)
Nurse communicated with Dr. Marcille Blanco new order morphine q4hrs related to patients headache 10/10. Will continue to monitor

## 2015-12-12 NOTE — Progress Notes (Signed)
Physical Therapy Treatment Patient Details Name: ALEKSIS THRUN MRN: [id number] DOB: 03-16-1971 Today's Date: 12/12/2015    History of Present Illness Pt is a 45 y.o. female presenting to hospital with weakness and dizziness x3 weeks.  Pt sent from MD office d/t hypotension.  Pt reporting occasional sharp stabbing chest pains, SOB with exertion, and chest pressure sometimes with ambulation.  Pt admitted with near syncope probably from hypotension.  PMH includes tear B medial meniscus.    PT Comments    Pt agreeable to PT. Currently has headache, but tolerable. No dizziness in bed or sitting edge of bed. Attempted static and dynamic in place standing march 3 times with pt experiencing dizziness each time with loss of balance reaction with bilateral upper extremities to catch balance. Blood pressure within normal range (106/76) with stand. Pt rests several minutes between attempts. Pt becomes tearful. Unable to progress PT further due to symptoms; pt returned to bed. Continue PT as appropriate for symptoms and testing results to improve function.  Follow Up Recommendations  Other (comment) (TBD)     Equipment Recommendations       Recommendations for Other Services       Precautions / Restrictions Precautions Precautions: Fall Restrictions Weight Bearing Restrictions: No    Mobility  Bed Mobility Overal bed mobility: Modified Independent             General bed mobility comments: Sit to/from supine with use of rails and mild increased time  Transfers Overall transfer level: Needs assistance Equipment used: None Transfers: Sit to/from Stand Sit to Stand: Supervision         General transfer comment: stands/sits holding bed rail  Ambulation/Gait             General Gait Details: Not attempted, as pt could only tolerate less than 30 seconds of static or dynamic in place stand before dizziness with LOB reactive response   Stairs            Wheelchair  Mobility    Modified Rankin (Stroke Patients Only)       Balance Overall balance assessment: Needs assistance Sitting-balance support: Feet supported;Bilateral upper extremity supported Sitting balance-Leahy Scale: Normal     Standing balance support: No upper extremity supported;Single extremity supported Standing balance-Leahy Scale: Fair Standing balance comment: Supervision to Min A for periodic dizziness episodes. Pt prefers holding on with 1 UE versus no UE support                    Cognition Arousal/Alertness: Awake/alert Behavior During Therapy: WFL for tasks assessed/performed (Becomes tearful) Overall Cognitive Status: Within Functional Limits for tasks assessed                      Exercises Other Exercises Other Exercises: Static stand and stand balance with in place march x 3. Dizziness cause need for sit within 30 seconds each of the 3 attempts    General Comments        Pertinent Vitals/Pain Pain Assessment: 0-10 Pain Score: 5  Pain Location: HA Pain Descriptors / Indicators: Headache Pain Intervention(s): Limited activity within patient's tolerance;Monitored during session    Home Living                      Prior Function            PT Goals (current goals can now be found in the care plan section)  Frequency  Min 2X/week    PT Plan Current plan remains appropriate    Co-evaluation             End of Session Equipment Utilized During Treatment: Gait belt Activity Tolerance: Other (comment) (limited by dizziness) Patient left: in bed;with call bell/phone within reach;with bed alarm set;with family/visitor present     Time: [id number] PT Time Calculation (min) (ACUTE ONLY): 16 min  Charges:  $Therapeutic Activity: 8-22 mins                    G Codes:      Charlaine Dalton, PTA 12/12/2015, 2:10 PM

## 2015-12-13 DIAGNOSIS — R930 Abnormal findings on diagnostic imaging of skull and head, not elsewhere classified: Secondary | ICD-10-CM | POA: Diagnosis not present

## 2015-12-13 DIAGNOSIS — R42 Dizziness and giddiness: Secondary | ICD-10-CM

## 2015-12-13 DIAGNOSIS — Z79899 Other long term (current) drug therapy: Secondary | ICD-10-CM | POA: Diagnosis not present

## 2015-12-13 DIAGNOSIS — I950 Idiopathic hypotension: Secondary | ICD-10-CM | POA: Diagnosis present

## 2015-12-13 DIAGNOSIS — K219 Gastro-esophageal reflux disease without esophagitis: Secondary | ICD-10-CM | POA: Diagnosis present

## 2015-12-13 DIAGNOSIS — E861 Hypovolemia: Secondary | ICD-10-CM | POA: Diagnosis present

## 2015-12-13 DIAGNOSIS — R55 Syncope and collapse: Secondary | ICD-10-CM | POA: Diagnosis present

## 2015-12-13 DIAGNOSIS — R51 Headache: Secondary | ICD-10-CM | POA: Diagnosis present

## 2015-12-13 DIAGNOSIS — I10 Essential (primary) hypertension: Secondary | ICD-10-CM | POA: Diagnosis present

## 2015-12-13 DIAGNOSIS — Z7982 Long term (current) use of aspirin: Secondary | ICD-10-CM | POA: Diagnosis not present

## 2015-12-13 LAB — PROTEIN AND GLUCOSE, CSF
GLUCOSE CSF: 60 mg/dL (ref 40–70)
TOTAL PROTEIN, CSF: 36 mg/dL (ref 15–45)

## 2015-12-13 LAB — CSF CELL COUNT WITH DIFFERENTIAL
Eosinophils, CSF: 0 %
LYMPHS CSF: 91 %
MONOCYTE-MACROPHAGE-SPINAL FLUID: 9 %
RBC Count, CSF: 166 /mm3 — ABNORMAL HIGH (ref 0–3)
Segmented Neutrophils-CSF: 0 %
Tube #: 1
WBC, CSF: 17 /mm3

## 2015-12-13 LAB — BASIC METABOLIC PANEL
Anion gap: 5 (ref 5–15)
BUN: 12 mg/dL (ref 6–20)
CALCIUM: 9 mg/dL (ref 8.9–10.3)
CO2: 26 mmol/L (ref 22–32)
CREATININE: 0.9 mg/dL (ref 0.44–1.00)
Chloride: 106 mmol/L (ref 101–111)
GFR calc Af Amer: >60 mL/min (ref >60)
Glucose, Bld: 100 mg/dL — ABNORMAL HIGH (ref 65–99)
Potassium: 3.7 mmol/L (ref 3.5–5.1)
SODIUM: 137 mmol/L (ref 135–145)

## 2015-12-13 MED ORDER — AMITRIPTYLINE HCL 10 MG PO TABS
10.0000 mg | ORAL_TABLET | Freq: Every day | ORAL | Status: DC
  Administered 2015-12-13 – 2015-12-14 (×2): 10 mg via ORAL
  Filled 2015-12-13 (×3): qty 1

## 2015-12-13 MED ORDER — POLYETHYLENE GLYCOL 3350 17 G PO PACK
17.0000 g | PACK | Freq: Every day | ORAL | Status: DC | PRN
  Administered 2015-12-13 – 2015-12-14 (×2): 17 g via ORAL
  Filled 2015-12-13 (×2): qty 1

## 2015-12-13 MED ORDER — MECLIZINE HCL 25 MG PO TABS
25.0000 mg | ORAL_TABLET | Freq: Three times a day (TID) | ORAL | Status: DC
  Administered 2015-12-13 – 2015-12-14 (×4): 25 mg via ORAL
  Filled 2015-12-13 (×4): qty 1

## 2015-12-13 MED ORDER — SENNA 8.6 MG PO TABS
2.0000 | ORAL_TABLET | Freq: Every day | ORAL | Status: DC
  Administered 2015-12-13 – 2015-12-15 (×3): 17.2 mg via ORAL
  Filled 2015-12-13 (×3): qty 2

## 2015-12-13 MED ORDER — ENOXAPARIN SODIUM 40 MG/0.4ML ~~LOC~~ SOLN
40.0000 mg | SUBCUTANEOUS | Status: DC
  Administered 2015-12-13: 40 mg via SUBCUTANEOUS
  Filled 2015-12-13 (×2): qty 0.4

## 2015-12-13 NOTE — Progress Notes (Signed)
Patient woke up with a HA. Patient rated her HA a 20. Nurse gave patient tramadal. Will continue to monitor.

## 2015-12-13 NOTE — Procedures (Signed)
LP Procedure Note:  Patient has been seen and examined.  Symptoms unchanged.  LP is being performed to rule out multiple sclerosis.  Procedure has been explained to patient/family including risks and benefits.  Consent has been signed by patient/family and witnessed.   Blood pressure 106/68, pulse 83, temperature 97.8 F (36.6 C), temperature source Oral, resp. rate 16, height 5\' 2"  (1.575 m), weight 94.802 kg (209 lb), SpO2 92 %.   Current facility-administered medications:  .  acetaminophen (TYLENOL) tablet 650 mg, 650 mg, Oral, Q6H PRN, 650 mg at 12/12/15 0616 **OR** acetaminophen (TYLENOL) suppository 650 mg, 650 mg, Rectal, Q6H PRN, Aruna Gouru, MD .  albuterol (PROVENTIL) (2.5 MG/3ML) 0.083% nebulizer solution 3 mL, 3 mL, Inhalation, Q6H PRN, Nicholes Mango, MD .  ALPRAZolam Duanne Moron) tablet 0.5 mg, 0.5 mg, Oral, BID, Nicholes Mango, MD, 0.5 mg at 12/13/15 0924 .  aspirin EC tablet 81 mg, 81 mg, Oral, Daily, Nicholes Mango, MD, 81 mg at 12/13/15 0924 .  docusate sodium (COLACE) capsule 100 mg, 100 mg, Oral, BID, Nicholes Mango, MD, 100 mg at 12/13/15 0924 .  meclizine (ANTIVERT) tablet 25 mg, 25 mg, Oral, TID PRN, Gladstone Lighter, MD, 25 mg at 12/12/15 1030 .  Medical Compression Stockings MISC 2 Units, 2 Units, Does not apply, Daily, Nicholes Mango, MD, 2 Units at 12/13/15 0930 .  morphine 2 MG/ML injection 2 mg, 2 mg, Intravenous, Q4H PRN, Harrie Foreman, MD, 2 mg at 12/12/15 [phone] .  ondansetron (ZOFRAN) tablet 4 mg, 4 mg, Oral, Q6H PRN **OR** ondansetron (ZOFRAN) injection 4 mg, 4 mg, Intravenous, Q6H PRN, Nicholes Mango, MD, 4 mg at 12/12/15 [id number] .  pantoprazole (PROTONIX) EC tablet 40 mg, 40 mg, Oral, Daily, Nicholes Mango, MD, 40 mg at 12/13/15 0924 .  traMADol (ULTRAM) tablet 50 mg, 50 mg, Oral, Q6H PRN, Gladstone Lighter, MD, 50 mg at 12/13/15 0600   Recent Labs  12/10/15 1135 12/11/15 0223 12/12/15 1346  WBC 8.1 8.0  --   HGB 12.7 11.9*  --   HCT 37.7 35.4  --   PLT 301 276  --   INR  --    --  1.07    CT of head:  Patient was placed in the lateral decub/sitting position.  Area was cleaned with betadine and anesthetized with lidocaine.  Under sterile conditions 20G LP needle was placed at approximately L3-4 without difficulty.  Opening pressure was documented at 12.  Approximately 20cc of clear and colorless fluid was obtained and sent for studies.  No complications were noted.   Due to continued complaints of dizziness, Meclizine changed from prn to TID since patient not taking regularly.     Alexis Goodell, MD Neurology [phone] 12/13/2015  10:30 AM

## 2015-12-13 NOTE — Progress Notes (Signed)
[facility] [place] NAME: Marie Vaughan    MR#:  [id number]  DATE OF BIRTH:  1970/09/14  SUBJECTIVE:  CHIEF COMPLAINT:   Chief Complaint  Patient presents with  . Hypotension   - Continues to have significant dizziness and headaches. Lumbar puncture today -MRI with abnormal white matter changes.  -No further hypotension  REVIEW OF SYSTEMS:  Review of Systems  Constitutional: Negative for fever and chills.  HENT: Negative for ear discharge, ear pain and nosebleeds.   Eyes: Negative for blurred vision and double vision.  Respiratory: Negative for cough, shortness of breath and wheezing.   Cardiovascular: Negative for chest pain, palpitations and leg swelling.  Gastrointestinal: Negative for nausea, vomiting, abdominal pain, diarrhea and constipation.  Genitourinary: Negative for dysuria and urgency.  Musculoskeletal: Negative for myalgias, back pain and neck pain.  Neurological: Positive for dizziness and headaches. Negative for sensory change, speech change, focal weakness, seizures and weakness.  Psychiatric/Behavioral: Negative for depression.    DRUG ALLERGIES:   Allergies  Allergen Reactions  . Chocolate     Hives, itching  . Iohexol     CP and SOB  . Peanut-Containing Drug Products Swelling    cashews    VITALS:  Blood pressure 123/70, pulse 97, temperature 98 F (36.7 C), temperature source Oral, resp. rate 18, height _0  (1.575 m), weight 94.802 kg (209 lb), SpO2 100 %.  PHYSICAL EXAMINATION:  Physical Exam  GENERAL:  45 y.o.-year-old patient lying in the bed with no acute distress.  EYES: Pupils equal, round, reactive to light and accommodation. No scleral icterus. Extraocular muscles intact.  HEENT: Head atraumatic, normocephalic. Oropharynx and nasopharynx clear.  NECK:  Supple, no jugular venous distention. No thyroid enlargement, no tenderness.  LUNGS: Normal breath sounds bilaterally, no wheezing,  rales,rhonchi or crepitation. No use of accessory muscles of respiration.  CARDIOVASCULAR: S1, S2 normal. No murmurs, rubs, or gallops.  ABDOMEN: Soft, nontender, nondistended. Bowel sounds present. No organomegaly or mass.  EXTREMITIES: No pedal edema, cyanosis, or clubbing.  NEUROLOGIC: Cranial nerves II through XII are intact. Muscle strength 5/5 in all extremities. Sensation intact. Gait not checked.  PSYCHIATRIC: The patient is alert and oriented x 3.  SKIN: No obvious rash, lesion, or ulcer.    LABORATORY PANEL:   CBC  Recent Labs Lab 12/11/15 0223  WBC 8.0  HGB 11.9*  HCT 35.4  PLT 276   ------------------------------------------------------------------------------------------------------------------  Chemistries   Recent Labs Lab 12/10/15 1417  12/13/15 0409  NA  --   < > 137  K  --   < > 3.7  CL  --   < > 106  CO2  --   < > 26  GLUCOSE  --   < > 100*  BUN  --   < > 12  CREATININE  --   < > 0.90  CALCIUM  --   < > 9.0  AST 21  --   --   ALT 21  --   --   ALKPHOS 51  --   --   BILITOT 0.5  --   --   < > = values in this interval not displayed. ------------------------------------------------------------------------------------------------------------------  Cardiac Enzymes  Recent Labs Lab 12/11/15 0219  TROPONINI <0.03   ------------------------------------------------------------------------------------------------------------------  RADIOLOGY:  Mr Virgel Paling Wo Contrast  12/12/2015  CLINICAL DATA:  Generalized weakness and dizziness. 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 EXAM: MRA HEAD WITHOUT CONTRAST TECHNIQUE:  Angiographic images of the Circle of Willis were obtained using MRA technique without intravenous contrast. COMPARISON:  MRI brain 12/11/2015.  MR brain 02/11/2007. FINDINGS: The internal carotid arteries are widely patent. The basilar artery is widely patent with vertebrals codominant. No flow-limiting intracranial  stenosis or aneurysm. No MCA branch occlusion. Codominant anterior cerebrals. Posterior circulation unremarkable. IMPRESSION: Negative MRA intracranial. Electronically Signed   By: Staci Righter M.D.   On: 12/12/2015 14:43   Mr Jeri Cos ZY Contrast  12/11/2015  CLINICAL DATA:  Headache. Generalized weakness associated with dizziness. Hypotension earlier today. History of hypertension. EXAM: MRI HEAD WITHOUT AND WITH CONTRAST TECHNIQUE: Multiplanar, multiecho pulse sequences of the brain and surrounding structures were obtained without and with intravenous contrast. CONTRAST:  62m MULTIHANCE GADOBENATE DIMEGLUMINE 529 MG/ML IV SOLN COMPARISON:  Head CT 12/11/2015 and MRI 02/11/2007 FINDINGS: There is no evidence of acute infarct, intracranial hemorrhage, mass, midline shift, or extra-axial fluid collection. Ventricles and sulci are normal for age. Patchy T2 hyperintensities are again seen throughout the cerebral white matter bilaterally, with more confluent signal abnormality in the periatrial regions. Cerebral white matter disease has mildly progressed from 2008. No significant involvement of the corpus callosum, brainstem, or cerebellum is identified. No enhancing lesions are seen. Orbits are unremarkable. There is a small right maxillary sinus mucous retention cyst. The mastoid air cells are clear. Major intracranial vascular flow voids are preserved. IMPRESSION: 1. No acute intracranial abnormality. 2. 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 Considerations include advanced chronic small vessel ischemia, hypercoagulable state, vasculitis, prior inflammation/ infection, and demyelination. Electronically Signed   By: ALogan BoresM.D.   On: 12/11/2015 18:02    EKG:   Orders placed or performed during the hospital encounter of 12/10/15  . ED EKG  . ED EKG    ASSESSMENT AND PLAN:   45year old female with past medical history significant for  hypertension, GERD, anemia presents to the hospital secondary to ongoing dizziness and presyncopal episode.  #1 Dizziness and headaches- could be combination of stress headaches vs chronic migraine and BPV - changed meclizine to TID scheduled - Neuro checks, appreciate neuro consult  -MRI of the brain with white matter changes. Lumbar puncture done today. Results will be followed up as outpatient for oligoclonal bands to rule out multiple sclerosis -CT angios negative for any occlusions -B12, homocysteine, CRP, ESR are ordered - Started amitriptyline at bedtime. Continue when necessary headache medications. -No cardiac causes identified. Echo normal. Appreciate cardiology consult  #2 hypotension-hypovolemic likely  -Home blood pressure medications on hold. Patient already held them almost 3 weeks ago since her symptoms started. -Improved with IV fluids. No orthostatic changes. Continue to hold blood pressure medicines  #3 GERD-Protonix  #4 DVT prophylaxis-Lovenox   Physical therapy consulted   All the records are reviewed and case discussed with Care Management/Social Workerr. Management plans discussed with the patient, family and they are in agreement.  CODE STATUS: Full code  TOTAL TIME TAKING CARE OF THIS PATIENT: 37 minutes.   POSSIBLE D/C TOMORROW, DEPENDING ON CLINICAL CONDITION.   KGladstone LighterM.D on 12/13/2015 at 12:34 PM  Between 7am to 6pm - Pager - [phone]  After 6pm go to [email or web address] - password [email or web address]  Office  [phone] CC: Primary care physician; LAshok Norris MD

## 2015-12-13 NOTE — Consult Note (Signed)
   [facility] CM Inpatient Consult   12/13/2015  AMYRA DELAROSA July 29, 1970 [id number]   Came by to see patient at bedside on behalf of the [place] [employer] program for [employer] with [employer]. Discussed Link to Wellness program. Link to wellness packet and contact information provided. Confirmed telephone number for post hospital discharge call as [phone]. For questions or concerns please contact:  Gola Bribiesca RN, [facility] Liaison  [phone]) Business Mobile [phone]) Toll free office

## 2015-12-13 NOTE — Progress Notes (Signed)
Physical Therapy Treatment Patient Details Name: Marie Vaughan MRN: [id number] DOB: 10-02-1970 Today's Date: 12/13/2015    History of Present Illness Pt is a 45 y.o. female presenting to hospital with weakness and dizziness x3 weeks.  Pt sent from MD office d/t hypotension.  Pt reporting occasional sharp stabbing chest pains, SOB with exertion, and chest pressure sometimes with ambulation.  Pt admitted with near syncope probably from hypotension.  PMH includes tear B medial meniscus.    PT Comments    Pt continues with severe headache and complains of significant back pain recently post lumbar puncture. Nursing just medicated pt with Tylenol. Pt agreeable to PT and wishes to try ambulation. Ambulates for 2 walks with rest in between. Continues with episodes of dizziness with mild loss of balance; pt notes she feels as if she "is on water" when episodes occur. Pt becomes briefly tearful after first walk, but collects and wishes to try again. Post second walk pt wished to sit edge of bed for a while. Refuses alarm, but agreeable to call for assist. Spouse in room. Continue PT to progress quality/safety of ambulation to improved functional mobility.   Follow Up Recommendations  Other (comment) (TBD)     Equipment Recommendations       Recommendations for Other Services       Precautions / Restrictions Precautions Precautions: Fall Restrictions Weight Bearing Restrictions: No    Mobility  Bed Mobility Overal bed mobility: Modified Independent             General bed mobility comments: Increased time/use of rails  Transfers Overall transfer level: Needs assistance Equipment used: None Transfers: Sit to/from Stand Sit to Stand: Supervision         General transfer comment: Pt use strong grip on bed rail to stand; slow to rise/sit  Ambulation/Gait Ambulation/Gait assistance: Min guard;Min assist Ambulation Distance (Feet): 20 Feet (second walk 30 ft) Assistive device:  None (does occasionally reach for furniture) Gait Pattern/deviations: Step-through pattern;Decreased stride length (very guarded/stiff) Gait velocity: decreased Gait velocity interpretation: <1.8 ft/sec, indicative of risk for recurrent falls General Gait Details: Ambulates very slow, stiff and guarded with several mild LOB episodes causing misstep or jerk/bracing type reaction requiriing MIn A at times   Stairs            Wheelchair Mobility    Modified Rankin (Stroke Patients Only)       Balance Overall balance assessment: Needs assistance Sitting-balance support: Feet supported Sitting balance-Leahy Scale: Normal     Standing balance support: No upper extremity supported Standing balance-Leahy Scale: Fair                      Cognition Arousal/Alertness: Awake/alert Behavior During Therapy: WFL for tasks assessed/performed Overall Cognitive Status: Within Functional Limits for tasks assessed                      Exercises      General Comments        Pertinent Vitals/Pain Pain Assessment: 0-10 Pain Score:  (greater than 10 HA; and LB significant post lumbar puncture) Pain Location: HA and LB Pain Intervention(s): Limited activity within patient's tolerance;Monitored during session;Premedicated before session;Repositioned    Home Living                      Prior Function            PT Goals (current goals can now be found  in the care plan section) Progress towards PT goals: Progressing toward goals    Frequency  Min 2X/week    PT Plan Current plan remains appropriate    Co-evaluation             End of Session Equipment Utilized During Treatment: Gait belt Activity Tolerance: Other (comment);Patient limited by pain (limited by dizziness) Patient left: in bed;with family/visitor present (sit edge of bed, tray in front. refuse alarm, will call)     Time: [id number] PT Time Calculation (min) (ACUTE ONLY): 21  min  Charges:  $Gait Training: 8-22 mins                    G Codes:      Charlaine Dalton, PTA 12/13/2015, 12:14 PM

## 2015-12-13 NOTE — Progress Notes (Signed)
Patient resting in the room at this time, lumbar puncture procedure done at bedside, patient tolerated, family at bedside,

## 2015-12-14 ENCOUNTER — Other Ambulatory Visit: Payer: Self-pay

## 2015-12-14 LAB — MISC LABCORP TEST (SEND OUT): LABCORP TEST CODE: 4051

## 2015-12-14 LAB — VITAMIN B1: Vitamin B1 (Thiamine): 117.8 nmol/L (ref 66.5–200.0)

## 2015-12-14 LAB — HOMOCYSTEINE: Homocysteine: 13.4 umol/L (ref 0.0–15.0)

## 2015-12-14 LAB — B. BURGDORFI ANTIBODIES

## 2015-12-14 LAB — MYELIN BASIC PROTEIN, CSF: MYELIN BASIC PROTEIN: 0.8 ng/mL (ref 0.0–1.2)

## 2015-12-14 MED ORDER — MECLIZINE HCL 25 MG PO TABS: 25.0000 mg | ORAL_TABLET | Freq: Three times a day (TID) | ORAL | Status: DC | PRN

## 2015-12-14 NOTE — Telephone Encounter (Signed)
Patient was admitted into the hospital this weekend.

## 2015-12-14 NOTE — Progress Notes (Signed)
[facility] [place] NAME: Marie Vaughan    MR#:  [id number]  DATE OF BIRTH:  Feb 08, 1971  SUBJECTIVE:  CHIEF COMPLAINT:   Chief Complaint  Patient presents with  . Hypotension   - Continues to have significant dizziness and headaches. Lumbar puncture 12/14/15 -MRI with abnormal white matter changes.  -Today during my examination patient is still complaining of dizziness associated with the room spinning around her. Very uncomfortable to go home. Headache is somewhat better  REVIEW OF SYSTEMS:  Review of Systems  Constitutional: Negative for fever and chills.  HENT: Negative for ear discharge, ear pain and nosebleeds.   Eyes: Negative for blurred vision and double vision.  Respiratory: Negative for cough, shortness of breath and wheezing.   Cardiovascular: Negative for chest pain, palpitations and leg swelling.  Gastrointestinal: Negative for nausea, vomiting, abdominal pain, diarrhea and constipation.  Genitourinary: Negative for dysuria and urgency.  Musculoskeletal: Negative for myalgias, back pain and neck pain.  Neurological: Positive for dizziness and headaches. Negative for sensory change, speech change, focal weakness, seizures and weakness.  Psychiatric/Behavioral: Negative for depression.    DRUG ALLERGIES:   Allergies  Allergen Reactions  . Chocolate     Hives, itching  . Iohexol     CP and SOB  . Peanut-Containing Drug Products Swelling    cashews    VITALS:  Blood pressure 109/65, pulse 89, temperature 98.3 F (36.8 C), temperature source Oral, resp. rate 19, height '5\' 2"'  (1.575 m), weight 94.802 kg (209 lb), SpO2 99 %.  PHYSICAL EXAMINATION:  Physical Exam  GENERAL:  45 y.o.-year-old patient lying in the bed with no acute distress.  EYES: Pupils equal, round, reactive to light and accommodation. No scleral icterus. Extraocular muscles intact.  HEENT: Head atraumatic, normocephalic. Oropharynx and  nasopharynx clear. Denies any tinnitus NECK:  Supple, no jugular venous distention. No thyroid enlargement, no tenderness.  LUNGS: Normal breath sounds bilaterally, no wheezing, rales,rhonchi or crepitation. No use of accessory muscles of respiration.  CARDIOVASCULAR: S1, S2 normal. No murmurs, rubs, or gallops.  ABDOMEN: Soft, nontender, nondistended. Bowel sounds present. No organomegaly or mass.  EXTREMITIES: No pedal edema, cyanosis, or clubbing.  NEUROLOGIC: Cranial nerves II through XII are intact. Muscle strength 5/5 in all extremities. Sensation intact. Gait not checked.  PSYCHIATRIC: The patient is alert and oriented x 3.  SKIN: No obvious rash, lesion, or ulcer.    LABORATORY PANEL:   CBC  Recent Labs Lab 12/11/15 0223  WBC 8.0  HGB 11.9*  HCT 35.4  PLT 276   ------------------------------------------------------------------------------------------------------------------  Chemistries   Recent Labs Lab 12/10/15 1417  12/13/15 0409  NA  --   < > 137  K  --   < > 3.7  CL  --   < > 106  CO2  --   < > 26  GLUCOSE  --   < > 100*  BUN  --   < > 12  CREATININE  --   < > 0.90  CALCIUM  --   < > 9.0  AST 21  --   --   ALT 21  --   --   ALKPHOS 51  --   --   BILITOT 0.5  --   --   < > = values in this interval not displayed. ------------------------------------------------------------------------------------------------------------------  Cardiac Enzymes  Recent Labs Lab 12/11/15 0219  TROPONINI <0.03   ------------------------------------------------------------------------------------------------------------------  RADIOLOGY:  No results found.  EKG:  Orders placed or performed during the hospital encounter of 12/10/15  . ED EKG  . ED EKG    ASSESSMENT AND PLAN:   45 year old female with past medical history significant for hypertension, GERD, anemia presents to the hospital secondary to ongoing dizziness and presyncopal episode.  #1 Dizziness  and headaches- could be bppv -Meclizine as needed - Neuro checks, appreciate neuro consult , okay to discharge patient from neurology standpoint and recommending outpatient neurology and ENT follow-up -MRI of the brain with white matter changes. Lumbar puncture done, CSF with no growth so far. Results will be followed up as outpatient for oligoclonal bands to rule out multiple sclerosis by outpatient neurology -CT angios negative for any occlusions -B12, 312,homocysteine13.4, cortisol is 15.1,  ESR is elevated at 41 - patient is started on amitriptyline at bedtime. Continue when necessary headache medications. -No cardiac causes identified. Echo normal. [place] cardiology consult -PT is recommending outpatient physical therapy  #2 hypotension-hypovolemic likely  -Home blood pressure medications on hold. Patient already held them almost 3 weeks ago since her symptoms started. -Improved with IV fluids. No orthostatic changes. Continue to hold blood pressure medicines  #3 GERD-Protonix  #4 DVT prophylaxis-Lovenox   Physical therapy is recommending outpatient physical therapy   All the records are reviewed and case discussed with Care Management/Social Workerr. Management plans discussed with the patient, she is in agreement.uncomfortable to be discharged today because of her dizziness.  CODE STATUS: Full code  TOTAL TIME TAKING CARE OF THIS PATIENT: 37 minutes.   POSSIBLE D/C TOMORROW, DEPENDING ON CLINICAL CONDITION.   Nicholes Mango M.D on 12/14/2015 at 3:35 PM  Between 7am to 6pm - Pager - [phone]  After 6pm go to [email or web address] - password [email or web address] [facility]  Office  [phone]  CC: Primary care physician; Ashok Norris, MD

## 2015-12-14 NOTE — Progress Notes (Signed)
Physical Therapy Treatment Patient Details Name: Marie Vaughan MRN: [id number] DOB: 08-01-1970 Today's Date: 12/14/2015    History of Present Illness Pt is a 45 y.o. female presenting to hospital with weakness and dizziness x3 weeks.  Pt sent from MD office d/t hypotension.  Pt reporting occasional sharp stabbing chest pains, SOB with exertion, and chest pressure sometimes with ambulation.  Pt admitted with near syncope probably from hypotension.  PMH includes tear B medial meniscus.    PT Comments    Pt notes feeling better this morning although continuing to deal with headaches and dizziness, symptoms are lessening. Pt up in bathroom upon arrival and completes tasks with supervision. Pt eager to attempt ambulation; therapist suggested a cane and pt agreeable. Improved ambulation distance and quality with cane; mild loss of balance 1-2 times each walk with self correction and noted improved confidence and stability. Chart review notes possible BPPV ; pt given [facility] outpatient PT phone number and names of therapist who specialize in treatment. Pt instructed she would need a prescription for OPPT; pt understands. Continue to progress quality and safety of ambulation to ensure optimal, safe return home.   Follow Up Recommendations  Outpatient PT     Equipment Recommendations  Cane    Recommendations for Other Services       Precautions / Restrictions Precautions Precautions: Fall Restrictions Weight Bearing Restrictions: No    Mobility  Bed Mobility               General bed mobility comments: Not tested up in bathoom  Transfers Overall transfer level: Needs assistance Equipment used: Straight cane Transfers: Sit to/from Stand (from chair and commode) Sit to Stand: Modified independent (Device/Increase time)         General transfer comment: slow to rise; notes improved steadiness with SPC and keeping eyes focused straight instead of on  floor  Ambulation/Gait Ambulation/Gait assistance: Min guard Ambulation Distance (Feet): 88 Feet (performed 2x) Assistive device: Straight cane Gait Pattern/deviations: Step-through pattern Gait velocity: decreased Gait velocity interpretation: Below normal speed for age/gender General Gait Details: Improved fluidity; continues slow and guarded, but a little less so overall. 1-2 mild LOB episodes with self correction and subjectively much improved feel with use of SPC regarding steadiness   Stairs Stairs: Yes Stairs assistance: Min guard Stair Management: One rail Left;Forwards;Step to pattern Number of Stairs: 1 General stair comments: No issues  Wheelchair Mobility    Modified Rankin (Stroke Patients Only)       Balance Overall balance assessment: Needs assistance Sitting-balance support: Feet supported Sitting balance-Leahy Scale: Normal     Standing balance support: Single extremity supported Standing balance-Leahy Scale: Fair                      Cognition Arousal/Alertness: Awake/alert Behavior During Therapy: WFL for tasks assessed/performed Overall Cognitive Status: Within Functional Limits for tasks assessed                      Exercises      General Comments        Pertinent Vitals/Pain Pain Assessment: 0-10 Pain Score: 5  Pain Location: Headache, mild LB pain Pain Descriptors / Indicators: Headache Pain Intervention(s): Premedicated before session    Home Living                      Prior Function            PT Goals (current  goals can now be found in the care plan section) Progress towards PT goals: Progressing toward goals    Frequency  Min 2X/week    PT Plan Current plan remains appropriate    Co-evaluation             End of Session Equipment Utilized During Treatment: Gait belt Activity Tolerance: Patient tolerated treatment well Patient left: in chair;with family/visitor present;Other (comment)  (In visitor chair; did not wish alarm, agrees to assistance)     Time: [id number] PT Time Calculation (min) (ACUTE ONLY): 34 min  Charges:  $Gait Training: 23-37 mins                    G Codes:      Charlaine Dalton, PTA 12/14/2015, 11:49 AM

## 2015-12-14 NOTE — Progress Notes (Signed)
Patient ambulate with PT assist, denies pain at this time, family at bedside, will continue to monitor.

## 2015-12-14 NOTE — Progress Notes (Signed)
Subjective: Patient continues to complain of headaches and dizziness.  No complications from LP.    Objective: Current vital signs: BP 102/65 mmHg  Pulse 92  Temp(Src) 97.8 F (36.6 C) (Oral)  Resp 18  Ht _0  (1.575 m)  Wt 94.802 kg (209 lb)  BMI 38.22 kg/m2  SpO2 100% Vital signs in last 24 hours: Temp:  [97.8 F (36.6 C)-98 F (36.7 C)] 97.8 F (36.6 C) (05/24 0935) Pulse Rate:  [89-97] 92 (05/24 0940) Resp:  [18-20] 18 (05/24 0935) BP: (94-123)/(59-72) 102/65 mmHg (05/24 0940) SpO2:  [98 %-100 %] 100 % (05/24 0935)  Intake/Output from previous day: 05/23 0701 - 05/24 0700 In: 58 [P.O.:580] Out: 0  Intake/Output this shift:   Nutritional status: Diet regular Room service appropriate?: Yes; Fluid consistency:: Thin Diet general  Neurologic Exam: Mental Status: Alert, oriented, thought content appropriate. Speech fluent without evidence of aphasia. Able to follow 3 step commands without difficulty. Cranial Nerves: II: Discs flat bilaterally; Visual fields grossly normal, pupils equal, round, reactive to light and accommodation III,IV, VI: ptosis not present, extra-ocular motions intact bilaterally V,VII: smile symmetric, facial light touch sensation normal bilaterally VIII: hearing normal bilaterally IX,X: gag reflex present XI: bilateral shoulder shrug XII: midline tongue extension Motor: Gives very little effort but able to give effort against gravity and has no focal weakness Sensory: Pinprick and light touch intact throughout, bilaterally Deep Tendon Reflexes: 3+ and symmetric throughout    Lab Results: Basic Metabolic Panel:  Recent Labs Lab 12/10/15 1135 12/11/15 0223 12/13/15 0409  NA 136 137 137  K 3.3* 3.2* 3.7  CL 103 105 106  CO2 _1 GLUCOSE 89 111* 100*  BUN _2 CREATININE 0.76 0.79 0.90  CALCIUM 9.9 9.0 9.0    Liver Function Tests:  Recent Labs Lab 12/10/15 1417  AST 21  ALT 21  ALKPHOS 51  BILITOT 0.5  PROT  8.0  ALBUMIN 4.3   No results for input(s): LIPASE, AMYLASE in the last 168 hours. No results for input(s): AMMONIA in the last 168 hours.  CBC:  Recent Labs Lab 12/10/15 1135 12/11/15 0223  WBC 8.1 8.0  HGB 12.7 11.9*  HCT 37.7 35.4  MCV 83.6 84.0  PLT 301 276    Cardiac Enzymes:  Recent Labs Lab 12/10/15 1417 12/10/15 1953 12/11/15 0219  TROPONINI <0.03 <0.03 <0.03    Lipid Panel: No results for input(s): CHOL, TRIG, HDL, CHOLHDL, VLDL, LDLCALC in the last 168 hours.  CBG:  Recent Labs Lab 12/10/15 1300  GLUCAP 67*    Microbiology: Results for orders placed or performed during the hospital encounter of 12/10/15  CSF culture     Status: None (Preliminary result)   Collection Time: 12/13/15 10:00 AM  Result Value Ref Range Status   Specimen Description CSF  Final   Special Requests Normal  Final   Gram Stain   Final    FEW RED BLOOD CELLS RARE WBC SEEN NO ORGANISMS SEEN    Culture PENDING  Incomplete   Report Status PENDING  Incomplete    Coagulation Studies:  Recent Labs  12/12/15 1346  LABPROT 14.1  INR 1.07    Imaging: Mr Jodene Nam Head Wo Contrast  12/12/2015  CLINICAL DATA:  Generalized weakness and dizziness. 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 EXAM: MRA HEAD WITHOUT CONTRAST TECHNIQUE: Angiographic images of the Circle of Willis were obtained using MRA technique without intravenous contrast. COMPARISON:  MRI brain 12/11/2015.  MR brain 02/11/2007. FINDINGS: The internal carotid arteries are widely patent. The basilar artery is widely patent with vertebrals codominant. No flow-limiting intracranial stenosis or aneurysm. No MCA branch occlusion. Codominant anterior cerebrals. Posterior circulation unremarkable. IMPRESSION: Negative MRA intracranial. Electronically Signed   By: Staci Righter M.D.   On: 12/12/2015 14:43    Medications:  I have reviewed the patient's current medications. Scheduled: . ALPRAZolam  0.5 mg  Oral BID  . amitriptyline  10 mg Oral QHS  . aspirin EC  81 mg Oral Daily  . docusate sodium  100 mg Oral BID  . enoxaparin (LOVENOX) injection  40 mg Subcutaneous Q24H  . meclizine  25 mg Oral TID  . Medical Compression Stockings  2 Units Does not apply Daily  . pantoprazole  40 mg Oral Daily  . senna  2 tablet Oral Daily    Assessment/Plan: Complaints continue and have not been improved by changing patient to scheduled Meclizine.  LP results show rbcs of 166, wbcs of 17.  Protein and glucose are normal.  Gram stain and culture negative to date.  Remaining results are pending.  Homocysteine, B1, CRP, serum lyme titer are all normal.  ESR 41.    Recommendations: 1.  Evaluation for inner ear pathology by ENT as an outpatient 2.  Continue follow up by neurology on an outpatient basis.  No further inpatient evaluation required at this time.    Case discussed with Dr. Margaretmary Eddy   LOS: 1 day   Alexis Goodell, MD Neurology [phone] 12/14/2015  10:31 AM

## 2015-12-15 ENCOUNTER — Encounter: Payer: Self-pay | Admitting: Cardiovascular Disease

## 2015-12-15 MED ORDER — AMITRIPTYLINE HCL 10 MG PO TABS: 10.0000 mg | ORAL_TABLET | Freq: Every day | ORAL | Status: DC

## 2015-12-15 MED ORDER — DIAZEPAM 5 MG PO TABS: 5.0000 mg | ORAL_TABLET | Freq: Two times a day (BID) | ORAL | Status: DC | PRN

## 2015-12-15 MED ORDER — SENNA 8.6 MG PO TABS: 2.0000 | ORAL_TABLET | Freq: Every day | ORAL | Status: DC

## 2015-12-15 MED ORDER — MECLIZINE HCL 25 MG PO TABS: 25.0000 mg | ORAL_TABLET | Freq: Three times a day (TID) | ORAL | Status: DC | PRN

## 2015-12-15 MED ORDER — BISACODYL 10 MG RE SUPP
10.0000 mg | Freq: Every day | RECTAL | Status: DC
  Administered 2015-12-15: 10 mg via RECTAL
  Filled 2015-12-15: qty 1

## 2015-12-15 NOTE — Progress Notes (Signed)
[place], [place].   12/15/2015  Patient: Marie Vaughan   Date of Birth:  03-04-1971  Date of admission:  12/10/2015  Date of Discharge  12/15/2015    To Whom it May Concern:   Marie Vaughan  may return to work on 12/20/15.  PHYSICAL ACTIVITY:  Full  If you have any questions or concerns, please don't hesitate to call.  Sincerely,   Nicholes Mango M.D Pager [phone] Office : [phone]   .

## 2015-12-15 NOTE — Discharge Summary (Signed)
[facility] NAME: Marie Vaughan    MR#:  [id number]  DATE OF BIRTH:  05/14/1971  DATE OF ADMISSION:  12/10/2015 ADMITTING PHYSICIAN: Nicholes Mango, MD  DATE OF DISCHARGE: 12/15/2015 PRIMARY CARE PHYSICIAN: Ashok Norris, MD    ADMISSION DIAGNOSIS:  Idiopathic hypotension [I95.0]  DISCHARGE DIAGNOSIS:  Near syncope Vertigo Abnormal MRI  SECONDARY DIAGNOSIS:   Past Medical History  Diagnosis Date  . Vaginal delivery     x 3  . Angio-edema   . HTN (hypertension)   . GERD (gastroesophageal reflux disease)   . Acne   . Anxiety   . Contact lens/glasses fitting     wears contacts or glasses  . Tear of medial meniscus of left knee   . Tear of medial meniscus of right knee   . Iron deficiency anemia secondary to blood loss (chronic)   . Migraine with aura, without mention of intractable migraine without mention of status migrainosus   . Acute bronchitis   . Pain in thoracic spine   . Pharyngitis   . Dysmenorrhea   . Anxiety state, unspecified   . 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 Cervicalgia   . Obesity   . History of stress test     a. Lexiscan 08/25/13: no sig ischemia, attenuation artifact noted, no sig WMA noted, EF 69%, no EKG changes concerning for ischemia  . History of cardiac cath     a. 04/29/2014: LM nl, LAD no dz, D1 no dz, D2 very small vessel, D3 very small vessel, LCx nl, OM1 very small vessel, OM2 medium sized vessel, OM 3 no dz, RCA no dz, PDA no dz     HOSPITAL COURSE:   45 year old female with past medical history significant for hypertension, GERD, anemia presents to the hospital secondary to ongoing dizziness and presyncopal episode.  #1 Dizziness and headaches- could be bppv -Meclizine as needed -  appreciate neuro consult , okay to discharge patient from neurology standpoint and recommending outpatient neurology and ENT follow-up -MRI of the brain with white matter changes.  Lumbar puncture done, CSF with no growth so far. Results will be followed up as outpatient for oligoclonal bands to rule out multiple sclerosis by outpatient neurology -CT angios negative for any occlusions -B12, 312,homocysteine13.4, cortisol is 15.1, ESR is elevated at 41 - patient is started on amitriptyline at bedtime. Continue when necessary headache medication Valium as per neurology recommendations -No cardiac causes identified. Echo normal. Appreciate cardiology consult -PT is recommending outpatient physical therapy and cane, prescription given  #2 hypotension-hypovolemic likely  -Home blood pressure medications on hold. Patient already held them almost 3 weeks ago since her symptoms started. -Improved with IV fluids. No orthostatic changes. Continue to hold blood pressure medicines, F/U WITH pcp  #3 GERD-Protonix  #4 DVT prophylaxis-Lovenox  DISCHARGE CONDITIONS:   fair  CONSULTS OBTAINED:  Treatment Team:  Will Meredith Leeds, MD Alexis Goodell, MD Catarina Hartshorn, MD Nicholes Mango, MD   PROCEDURES  LP  DRUG ALLERGIES:   Allergies  Allergen Reactions  . Chocolate     Hives, itching  . Iohexol     CP and SOB  . Peanut-Containing Drug Products Swelling    cashews    DISCHARGE MEDICATIONS:   Current Discharge Medication List    START taking these medications   Details  amitriptyline (ELAVIL) 10 MG tablet Take 1 tablet (10 mg total) by mouth at bedtime. Qty: 30 tablet, Refills:  0    diazepam (VALIUM) 5 MG tablet Take 1 tablet (5 mg total) by mouth every 12 (twelve) hours as needed for anxiety (HEADACHE). Qty: 20 tablet, Refills: 0    meclizine (ANTIVERT) 25 MG tablet Take 1 tablet (25 mg total) by mouth 3 (three) times daily as needed for dizziness. Qty: 30 tablet, Refills: 0    senna (SENOKOT) 8.6 MG TABS tablet Take 2 tablets (17.2 mg total) by mouth daily. Qty: 120 each, Refills: 0      CONTINUE these medications which have CHANGED   Details   ALPRAZolam (XANAX) 0.5 MG tablet TAKE 1 TABLET BY MOUTH TWICE DAILY AS NEEDED FOR ANXIETY. Qty: 30 tablet, Refills: 5      CONTINUE these medications which have NOT CHANGED   Details  albuterol (PROVENTIL HFA;VENTOLIN HFA) 108 (90 Base) MCG/ACT inhaler Inhale 2 puffs into the lungs every 6 (six) hours as needed for wheezing or shortness of breath. Qty: 1 Inhaler, Refills: 0   Associated Diagnoses: Cough    aspirin EC 81 MG tablet Take 1 tablet (81 mg total) by mouth daily. Qty: 90 tablet, Refills: 3    Elastic Bandages & Supports (MEDICAL COMPRESSION STOCKINGS) MISC 2 Units by Does not apply route daily. Qty: 2 each, Refills: 0   Associated Diagnoses: Bilateral edema of lower extremity    esomeprazole (NEXIUM) 40 MG capsule Take 1 capsule (40 mg total) by mouth daily before breakfast.      STOP taking these medications     chlorpheniramine-HYDROcodone (TUSSIONEX PENNKINETIC ER) 10-8 MG/5ML SUER      olmesartan-hydrochlorothiazide (BENICAR HCT) 40-25 MG tablet      cefdinir (OMNICEF) 300 MG capsule          DISCHARGE INSTRUCTIONS:   Activity as tolerated per PT recommendations, outpatient PT and continue to use cane Follow-up with primary care physician in a week  F/u wuth neurology in 3-4 days, neurology to follow up on the CSF results which are pending Outpatient follow-up with ENT in 5-7 days  DIET:  Low-salt  DISCHARGE CONDITION:  Fair  ACTIVITY:   Activity as tolerated per PT recommendations, outpatient PT and continue to use cane  OXYGEN:  Home Oxygen: No.   Oxygen Delivery: room air  DISCHARGE LOCATION:  home   If you experience worsening of your admission symptoms, develop shortness of breath, life threatening emergency, suicidal or homicidal thoughts you must seek medical attention immediately by calling 911 or calling your MD immediately  if symptoms less severe.  You Must read complete instructions/literature along with all the possible adverse  reactions/side effects for all the Medicines you take and that have been prescribed to you. Take any new Medicines after you have completely understood and accpet all the possible adverse reactions/side effects.   Please note  You were cared for by a hospitalist during your hospital stay. If you have any questions about your discharge medications or the care you received while you were in the hospital after you are discharged, you can call the unit and asked to speak with the hospitalist on call if the hospitalist that took care of you is not available. Once you are discharged, your primary care physician will handle any further medical issues. Please note that NO REFILLS for any discharge medications will be authorized once you are discharged, as it is imperative that you return to your primary care physician (or establish a relationship with a primary care physician if you do not have one) for your aftercare  needs so that they can reassess your need for medications and monitor your lab values.     Today  Chief Complaint  Patient presents with  . Hypotension   Patient is still complaining of vertigo but feeling much better today. Denies nausea vomiting. Headaches are better. Reporting constipation and requesting suppository  ROS:  CONSTITUTIONAL: Denies fevers, chills. Denies any fatigue, weakness.  EYES: Denies blurry vision, double vision, eye pain. EARS, NOSE, THROAT: Denies tinnitus, ear pain, hearing loss. RESPIRATORY: Denies cough, wheeze, shortness of breath.  CARDIOVASCULAR: Denies chest pain, palpitations, edema.  GASTROINTESTINAL: Denies nausea, vomiting, diarrhea, abdominal pain. Denies bright red blood per rectum. Reporting constipation  GENITOURINARY: Denies dysuria, hematuria. ENDOCRINE: Denies nocturia or thyroid problems. HEMATOLOGIC AND LYMPHATIC: Denies easy bruising or bleeding. SKIN: Denies rash or lesion. MUSCULOSKELETAL: Denies pain in neck, back, shoulder, knees,  hips or arthritic symptoms.  NEUROLOGIC: Denies paralysis, paresthesias. Reporting vertigo PSYCHIATRIC: Denies anxiety or depressive symptoms.   VITAL SIGNS:  Blood pressure 124/80, pulse 104, temperature 98 F (36.7 C), temperature source Oral, resp. rate 19, height '5\' 2"'  (1.575 m), weight 94.802 kg (209 lb), SpO2 99 %.  I/O:    Intake/Output Summary (Last 24 hours) at 12/15/15 1319 Last data filed at 12/15/15 0900  Gross per 24 hour  Intake    240 ml  Output      0 ml  Net    240 ml    PHYSICAL EXAMINATION:  GENERAL:  45 y.o.-year-old patient lying in the bed with no acute distress.  EYES: Pupils equal, round, reactive to light and accommodation. No scleral icterus. Extraocular muscles intact.  HEENT: Head atraumatic, normocephalic. Oropharynx and nasopharynx clear.  NECK:  Supple, no jugular venous distention. No thyroid enlargement, no tenderness.  LUNGS: Normal breath sounds bilaterally, no wheezing, rales,rhonchi or crepitation. No use of accessory muscles of respiration.  CARDIOVASCULAR: S1, S2 normal. No murmurs, rubs, or gallops.  ABDOMEN: Soft, non-tender, non-distended. Bowel sounds present. No organomegaly or mass.  EXTREMITIES: No pedal edema, cyanosis, or clubbing.  NEUROLOGIC: Cranial nerves II through XII are intact. Muscle strength 5/5 in all extremities. Sensation intact. Gait not checked.  PSYCHIATRIC: The patient is alert and oriented x 3.  SKIN: No obvious rash, lesion, or ulcer.   DATA REVIEW:   CBC  Recent Labs Lab 12/11/15 0223  WBC 8.0  HGB 11.9*  HCT 35.4  PLT 276    Chemistries   Recent Labs Lab 12/10/15 1417  12/13/15 0409  NA  --   < > 137  K  --   < > 3.7  CL  --   < > 106  CO2  --   < > 26  GLUCOSE  --   < > 100*  BUN  --   < > 12  CREATININE  --   < > 0.90  CALCIUM  --   < > 9.0  AST 21  --   --   ALT 21  --   --   ALKPHOS 51  --   --   BILITOT 0.5  --   --   < > = values in this interval not displayed.  Cardiac  Enzymes  Recent Labs Lab 12/11/15 0219  TROPONINI <0.03    Microbiology Results  Results for orders placed or performed during the hospital encounter of 12/10/15  CSF culture     Status: None (Preliminary result)   Collection Time: 12/13/15 10:00 AM  Result Value Ref Range Status  Specimen Description CSF  Final   Special Requests Normal  Final   Gram Stain   Final    FEW RED BLOOD CELLS RARE WBC SEEN NO ORGANISMS SEEN    Culture NO GROWTH 2 DAYS  Final   Report Status PENDING  Incomplete    RADIOLOGY:  Mr Virgel Paling Wo Contrast  12/12/2015  CLINICAL DATA:  Generalized weakness and dizziness. 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 EXAM: MRA HEAD WITHOUT CONTRAST TECHNIQUE: Angiographic images of the Circle of Willis were obtained using MRA technique without intravenous contrast. COMPARISON:  MRI brain 12/11/2015.  MR brain 02/11/2007. FINDINGS: The internal carotid arteries are widely patent. The basilar artery is widely patent with vertebrals codominant. No flow-limiting intracranial stenosis or aneurysm. No MCA branch occlusion. Codominant anterior cerebrals. Posterior circulation unremarkable. IMPRESSION: Negative MRA intracranial. Electronically Signed   By: Staci Righter M.D.   On: 12/12/2015 14:43   Mr Jeri Cos MI Contrast  12/11/2015  CLINICAL DATA:  Headache. Generalized weakness associated with dizziness. Hypotension earlier today. History of hypertension. EXAM: MRI HEAD WITHOUT AND WITH CONTRAST TECHNIQUE: Multiplanar, multiecho pulse sequences of the brain and surrounding structures were obtained without and with intravenous contrast. CONTRAST:  65m MULTIHANCE GADOBENATE DIMEGLUMINE 529 MG/ML IV SOLN COMPARISON:  Head CT 12/11/2015 and MRI 02/11/2007 FINDINGS: There is no evidence of acute infarct, intracranial hemorrhage, mass, midline shift, or extra-axial fluid collection. Ventricles and sulci are normal for age. Patchy T2 hyperintensities are again  seen throughout the cerebral white matter bilaterally, with more confluent signal abnormality in the periatrial regions. Cerebral white matter disease has mildly progressed from 2008. No significant involvement of the corpus callosum, brainstem, or cerebellum is identified. No enhancing lesions are seen. Orbits are unremarkable. There is a small right maxillary sinus mucous retention cyst. The mastoid air cells are clear. Major intracranial vascular flow voids are preserved. IMPRESSION: 1. No acute intracranial abnormality. 2. 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 Considerations include advanced chronic small vessel ischemia, hypercoagulable state, vasculitis, prior inflammation/ infection, and demyelination. Electronically Signed   By: ALogan BoresM.D.   On: 12/11/2015 18:02    EKG:   Orders placed or performed during the hospital encounter of 12/10/15  . ED EKG  . ED EKG      Management plans discussed with the patient, family and they are in agreement.  CODE STATUS:     Code Status Orders        Start     Ordered   12/10/15 2131  Full code   Continuous     12/10/15 2130    Code Status History    Date Active Date Inactive Code Status Order ID Comments User Context   This patient has a current code status but no historical code status.      TOTAL TIME TAKING CARE OF THIS PATIENT: 45  minutes.    '@[facility]' @  on 12/15/2015 at 1:19 PM  Between 7am to 6pm - Pager - [phone] After 6pm go to [email or web address] - password [email or web address]  Office  [phone] CC: Primary care physician; LAshok Norris MD

## 2015-12-15 NOTE — Discharge Instructions (Signed)
Hypotension As your heart beats, it forces blood through your arteries. This force is your blood pressure. If your blood pressure is too low for you to go about your normal activities or to support the organs of your body, you have hypotension. Hypotension is also referred to as low blood pressure. When your blood pressure becomes too low, you may not get enough blood to your brain. As a result, you may feel weak, feel lightheaded, or develop a rapid heart rate. In a more severe case, you may faint. CAUSES Various conditions can cause hypotension. These include:  Blood loss.  Dehydration.  Heart or endocrine problems.  Pregnancy.  Severe infection.  Not having a well-balanced diet filled with needed nutrients.  Severe allergic reactions (anaphylaxis). Some medicines, such as blood pressure medicine or water pills (diuretics), may lower your blood pressure below normal. Sometimes taking too much medicine or taking medicine not as directed can cause hypotension. TREATMENT  Hospitalization is sometimes required for hypotension if fluid or blood replacement is needed, if time is needed for medicines to wear off, or if further monitoring is needed. Treatment might include changing your diet, changing your medicines (including medicines aimed at raising your blood pressure), and use of support stockings. HOME CARE INSTRUCTIONS   Drink enough fluids to keep your urine clear or pale yellow.  Take your medicines as directed by your health care provider.  Get up slowly from reclining or sitting positions. This gives your blood pressure a chance to adjust.  Wear support stockings as directed by your health care provider.  Maintain a healthy diet by including nutritious food, such as fruits, vegetables, nuts, whole grains, and lean meats. SEEK MEDICAL CARE IF:  You have vomiting or diarrhea.  You have a fever for more than 2-3 days.  You feel more thirsty than usual.  You feel weak and  tired. SEEK IMMEDIATE MEDICAL CARE IF:   You have chest pain or a fast or irregular heartbeat.  You have a loss of feeling in some part of your body, or you lose movement in your arms or legs.  You have trouble speaking.  You become sweaty or feel lightheaded.  You faint. MAKE SURE YOU:   Understand these instructions.  Will watch your condition.  Will get help right away if you are not doing well or get worse.   This information is not intended to replace advice given to you by your health care provider. Make sure you discuss any questions you have with your health care provider.   Document Released: 07/09/2005 Document Revised: 04/29/2013 Document Reviewed: 01/09/2013 Elsevier Interactive Patient Education 2016 [employer].  Near-Syncope Near-syncope (commonly known as near fainting) is sudden weakness, dizziness, or feeling like you might pass out. This can happen when getting up or while standing for a long time. It is caused by a sudden decrease in blood flow to the brain, which can occur for various reasons. Most of the reasons are not serious.  HOME CARE Watch your condition for any changes.  Have someone stay with you until you feel stable.  If you feel like you are going to pass out:  Lie down right away.  Prop your feet up if you can.  Breathe deeply and steadily.  Move only when the feeling has gone away. Most of the time, this feeling lasts only a few minutes. You may feel tired for several hours.  Drink enough fluids to keep your pee (urine) clear or pale yellow.  If you are taking blood pressure or heart medicine, stand up slowly.  Follow up with your doctor as told. GET HELP RIGHT AWAY IF:   You have a severe headache.  You have unusual pain in the chest, belly (abdomen), or back.  You have bleeding from the mouth or butt (rectum), or you have black or tarry poop (stool).  You feel your heart beat differently than normal, or you have a very fast  pulse.  You pass out, or you twitch and shake when you pass out.  You pass out when sitting or lying down.  You feel confused.  You have trouble walking.  You are weak.  You have vision problems. MAKE SURE YOU:   Understand these instructions.  Will watch your condition.  Will get help right away if you are not doing well or get worse.   This information is not intended to replace advice given to you by your health care provider. Make sure you discuss any questions you have with your health care provider.   Document Released: 12/26/2007 Document Revised: 07/30/2014 Document Reviewed: 12/12/2012 Elsevier Interactive Patient Education 2016 [employer].   Activity as tolerated per PT recommendations, outpatient PT and continue to use cane Follow-up with primary care physician in a week  F/u wuth neurology in 3-4 days, neurology to follow up on the CSF results which are pending Outpatient follow-up with ENT in 5-7 days

## 2015-12-15 NOTE — Progress Notes (Signed)
Patient remains alert and oriented, suppository bisacodyl administer for constipation, patient being discharge today, discharge instruction provided, iv removed, tele removed.

## 2015-12-16 LAB — CSF CULTURE W GRAM STAIN: Culture: NO GROWTH

## 2015-12-16 LAB — CSF CULTURE: SPECIAL REQUESTS: NORMAL

## 2015-12-20 ENCOUNTER — Ambulatory Visit (INDEPENDENT_AMBULATORY_CARE_PROVIDER_SITE_OTHER): Payer: 59 | Admitting: Family Medicine

## 2015-12-20 ENCOUNTER — Encounter: Payer: Self-pay | Admitting: Family Medicine

## 2015-12-20 VITALS — BP 112/72 | HR 101 | Temp 98.7°F | Resp 16 | Ht 62.0 in | Wt 218.2 lb

## 2015-12-20 DIAGNOSIS — R531 Weakness: Secondary | ICD-10-CM | POA: Diagnosis not present

## 2015-12-20 DIAGNOSIS — G43109 Migraine with aura, not intractable, without status migrainosus: Secondary | ICD-10-CM | POA: Diagnosis not present

## 2015-12-20 DIAGNOSIS — R930 Abnormal findings on diagnostic imaging of skull and head, not elsewhere classified: Secondary | ICD-10-CM | POA: Diagnosis not present

## 2015-12-20 DIAGNOSIS — R9089 Other abnormal findings on diagnostic imaging of central nervous system: Secondary | ICD-10-CM | POA: Insufficient documentation

## 2015-12-20 DIAGNOSIS — E876 Hypokalemia: Secondary | ICD-10-CM | POA: Diagnosis not present

## 2015-12-20 DIAGNOSIS — I950 Idiopathic hypotension: Secondary | ICD-10-CM | POA: Diagnosis not present

## 2015-12-20 DIAGNOSIS — R42 Dizziness and giddiness: Secondary | ICD-10-CM

## 2015-12-20 MED ORDER — DIAZEPAM 5 MG PO TABS: 5.0000 mg | ORAL_TABLET | Freq: Two times a day (BID) | ORAL | Status: DC | PRN

## 2015-12-20 NOTE — Progress Notes (Signed)
Name: Marie Vaughan   MRN: [id number]    DOB: 12/22/1970   Date:12/20/2015       Progress Note  Subjective  Chief Complaint  Chief Complaint  Patient presents with  . Hospitalization Follow-up    Patient went to Dec 10, 2015 to 12/15/15 Diagnosis Unknown, Labs, Echocardiogram, 2- MRI's, Spinal Tap for evaluation, Scans-CT, Chest X-Ray. Dr. Fletcher Anon, Cardiologist on January 03, 2016 and Dr. Shah-Neurologist at [facility] on January 10, 2016     [facility] Follow up: she went to [facility] on 05/20th because of near syncopal episode. Her bp was low, she was admitted with diagnosed of idiopathic hypotension and discharged with diagnosed of abnormal MRI - unknown etiology, elevated sed rate, dizziness and was given elavil for headache prevention ( she has been tolerating it well ) and valium prn. She has been out of work. She still feels dizzy and tired, she was advised to stay hydrated and use a cane and also compression stocking hoses, but she has not been using it consistently. Since discharge she has bene taking Diazepam and Meclizine every morning. Explained that she is only supposed to take Valium prn migraine and not daily . She has not started PT and needs referral to ENT.    Patient Active Problem List   Diagnosis Date Noted  . Abnormal brain MRI 12/20/2015  . Dizziness 12/13/2015  . Near syncope 12/10/2015  . Atypical chest pain 08/22/2013  . Essential hypertension 08/22/2013  . Cough 01/05/2013  . Angio-edema   . GERD (gastroesophageal reflux disease)   . Migraine   . Acne   . Anxiety   . Allergic rhinitis 10/05/2011  . Insomnia 08/30/2011  . Obesity 08/30/2011    Past Surgical History  Procedure Laterality Date  . Ovarian cyst removal  04-25-10  . Abdominal hysterectomy  04-25-10    Due to abnormal PAP  . Knee arthroscopy with medial menisectomy Right 06/10/2013    Procedure: RIGHT KNEE ARTHROSCOPY WITH MEDIAL MENISECTOMY;  Surgeon: Lorn Junes, MD;  Location: [place];  Service: Orthopedics;  Laterality: Right;  partial lateral menisectomoy and chondroplasty  . Knee arthroscopy Left 06/10/2013    Procedure: LEFT KNEE ARTHROSCOPY KNEE WITH PARTIAL MEDIAL MENISCECTOMY ;  Surgeon: Lorn Junes, MD;  Location: [place];  Service: Orthopedics;  Laterality: Left;  xerofoam, 4x4's, webril, ace wrap, ice wrap  . Dilation and curettage of uterus    . Cardiac catheterization  04/2014    [facility]    Family History  Problem Relation Age of Onset  . Early death Mother     Murdered when pt was 63 years old  . Diabetes Mother   . Hypertension Mother   . Gout Mother   . Kidney failure Mother   . Early death Sister 31    due to hemorage  . Alcohol abuse Maternal Uncle   . Alcohol abuse Other   . Arthritis Other   . Heart disease Other   . Other Other     cousin- phlebitis  . Diabetes Maternal Aunt   . Hyperlipidemia Maternal Aunt   . Hypertension Maternal Aunt   . Prostate cancer Maternal Uncle     Social History   Social History  . Marital Status: Single    Spouse Name: N/A  . Number of Children: 3  . Years of Education: N/A   Occupational History  . Medical records - [place]  History Main Topics  . Smoking status: Never Smoker   . Smokeless tobacco: Never Used  . Alcohol Use: No  . Drug Use: No  . Sexual Activity:    Partners: Male   Other Topics Concern  . Not on file   Social History Narrative   Lives in [place] with fiance, and 2 children 63 and 16. Works at [employer].      Regular Exercise -  Walk, 30 - 1 hr daily   Daily Caffeine Use:  1 cup coffee AM (in cold weather)              Current outpatient prescriptions:  .  albuterol (PROVENTIL HFA;VENTOLIN HFA) 108 (90 Base) MCG/ACT inhaler, Inhale 2 puffs into the lungs every 6 (six) hours as needed for wheezing or shortness of breath., Disp: 1 Inhaler, Rfl: 0 .  ALPRAZolam (XANAX) 0.5 MG tablet, TAKE 1 TABLET BY MOUTH TWICE DAILY AS  NEEDED FOR ANXIETY., Disp: 30 tablet, Rfl: 5 .  amitriptyline (ELAVIL) 10 MG tablet, Take 1 tablet (10 mg total) by mouth at bedtime., Disp: 30 tablet, Rfl: 0 .  aspirin EC 81 MG tablet, Take 1 tablet (81 mg total) by mouth daily., Disp: 90 tablet, Rfl: 3 .  diazepam (VALIUM) 5 MG tablet, Take 1 tablet (5 mg total) by mouth every 12 (twelve) hours as needed for anxiety (HEADACHE)., Disp: 20 tablet, Rfl: 0 .  Elastic Bandages & Supports (MEDICAL COMPRESSION STOCKINGS) MISC, 2 Units by Does not apply route daily., Disp: 2 each, Rfl: 0 .  esomeprazole (NEXIUM) 40 MG capsule, Take 1 capsule (40 mg total) by mouth daily before breakfast., Disp: , Rfl:  .  meclizine (ANTIVERT) 25 MG tablet, Take 1 tablet (25 mg total) by mouth 3 (three) times daily as needed for dizziness., Disp: 30 tablet, Rfl: 0 .  senna (SENOKOT) 8.6 MG TABS tablet, Take 2 tablets (17.2 mg total) by mouth daily., Disp: 120 each, Rfl: 0  Allergies  Allergen Reactions  . Chocolate     Hives, itching  . Iohexol     CP and SOB  . Peanut-Containing Drug Products Swelling    cashews     ROS  Ten systems reviewed and is negative except as mentioned in HPI   Objective  Filed Vitals:   12/20/15 1544  BP: 112/72  Pulse: 101  Temp: 98.7 F (37.1 C)  TempSrc: Oral  Resp: 16  Height: _0  (1.575 m)  Weight: 218 lb 3.2 oz (98.975 kg)  SpO2: 99%    Body mass index is 39.9 kg/(m^2).  Physical Exam  Constitutional: Patient appears well-developed and well-nourished. Obese  No distress.  HEENT: head atraumatic, normocephalic, pupils equal and reactive to light, e neck supple, throat within normal limits Cardiovascular: Normal rate, regular rhythm and normal heart sounds.  No murmur heard. No BLE edema. Pulmonary/Chest: Effort normal and breath sounds normal. No respiratory distress. Abdominal: Soft.  There is no tenderness. Psychiatric: Patient has a normal mood and affect. behavior is normal. Judgment and thought content  normal. Neurological: normal cranial nerves , no focal deficit  Recent Results (from the past 2160 hour(s))  POCT Influenza A/B (POC66)     Status: Normal   Collection Time: 11/14/15  2:43 PM  Result Value Ref Range   Influenza A, POC Negative Negative   Influenza B, POC Negative Negative  CBC with Differential/Platelet     Status: Abnormal   Collection Time: 12/07/15  8:44 AM  Result Value Ref Range  WBC 6.5 3.4 - 10.8 x10E3/uL   RBC 3.99 3.77 - 5.28 x10E6/uL   Hemoglobin 11.2 11.1 - 15.9 g/dL   Hematocrit 33.9 (L) 34.0 - 46.6 %   MCV 85 79 - 97 fL   MCH 28.1 26.6 - 33.0 pg   MCHC 33.0 31.5 - 35.7 g/dL   RDW 13.9 12.3 - 15.4 %   Platelets 299 150 - 379 x10E3/uL   Neutrophils 61 %   Lymphs 31 %   Monocytes 6 %   Eos 1 %   Basos 0 %   Neutrophils Absolute 4.0 1.4 - 7.0 x10E3/uL   Lymphocytes Absolute 2.0 0.7 - 3.1 x10E3/uL   Monocytes Absolute 0.4 0.1 - 0.9 x10E3/uL   EOS (ABSOLUTE) 0.1 0.0 - 0.4 x10E3/uL   Basophils Absolute 0.0 0.0 - 0.2 x10E3/uL   Immature Granulocytes 1 %   Immature Grans (Abs) 0.0 0.0 - 0.1 x10E3/uL  Comprehensive metabolic panel     Status: None   Collection Time: 12/07/15  8:44 AM  Result Value Ref Range   Glucose 93 65 - 99 mg/dL   BUN 10 6 - 24 mg/dL   Creatinine, Ser 0.77 0.57 - 1.00 mg/dL   GFR calc non Af Amer 93 >59 mL/min/1.73   GFR calc Af Amer 107 >59 mL/min/1.73   BUN/Creatinine Ratio 13 9 - 23   Sodium 141 134 - 144 mmol/L   Potassium 4.0 3.5 - 5.2 mmol/L   Chloride 103 96 - 106 mmol/L   CO2 23 18 - 29 mmol/L   Calcium 9.2 8.7 - 10.2 mg/dL   Total Protein 6.3 6.0 - 8.5 g/dL   Albumin 3.9 3.5 - 5.5 g/dL   Globulin, Total 2.4 1.5 - 4.5 g/dL   Albumin/Globulin Ratio 1.6 1.2 - 2.2   Bilirubin Total <0.2 0.0 - 1.2 mg/dL   Alkaline Phosphatase 57 39 - 117 IU/L   AST 14 0 - 40 IU/L   ALT 14 0 - 32 IU/L  TSH     Status: None   Collection Time: 12/07/15  8:44 AM  Result Value Ref Range   TSH 1.310 0.450 - 4.500 uIU/mL  Basic  metabolic panel     Status: Abnormal   Collection Time: 12/10/15 11:35 AM  Result Value Ref Range   Sodium 136 135 - 145 mmol/L   Potassium 3.3 (L) 3.5 - 5.1 mmol/L   Chloride 103 101 - 111 mmol/L   CO2 25 22 - 32 mmol/L   Glucose, Bld 89 65 - 99 mg/dL   BUN 12 6 - 20 mg/dL   Creatinine, Ser 0.76 0.44 - 1.00 mg/dL   Calcium 9.9 8.9 - 10.3 mg/dL   GFR calc non Af Amer >60 >60 mL/min   GFR calc Af Amer >60 >60 mL/min    Comment: (NOTE) The eGFR has been calculated using the CKD EPI equation. This calculation has not been validated in all clinical situations. eGFR's persistently <60 mL/min signify possible Chronic Kidney Disease.    Anion gap 8 5 - 15  CBC     Status: None   Collection Time: 12/10/15 11:35 AM  Result Value Ref Range   WBC 8.1 3.6 - 11.0 K/uL   RBC 4.51 3.80 - 5.20 MIL/uL   Hemoglobin 12.7 12.0 - 16.0 g/dL   HCT 37.7 35.0 - 47.0 %   MCV 83.6 80.0 - 100.0 fL   MCH 28.1 26.0 - 34.0 pg   MCHC 33.6 32.0 - 36.0 g/dL   RDW  14.2 11.5 - 14.5 %   Platelets 301 150 - 440 K/uL  Urinalysis complete, with microscopic     Status: Abnormal   Collection Time: 12/10/15 11:35 AM  Result Value Ref Range   Color, Urine YELLOW (A) YELLOW   APPearance CLEAR (A) CLEAR   Glucose, UA NEGATIVE NEGATIVE mg/dL   Bilirubin Urine NEGATIVE NEGATIVE   Ketones, ur NEGATIVE NEGATIVE mg/dL   Specific Gravity, Urine 1.015 1.005 - 1.030   Hgb urine dipstick NEGATIVE NEGATIVE   pH 6.0 5.0 - 8.0   Protein, ur NEGATIVE NEGATIVE mg/dL   Nitrite NEGATIVE NEGATIVE   Leukocytes, UA NEGATIVE NEGATIVE   RBC / HPF NONE SEEN 0 - 5 RBC/hpf   WBC, UA 0-5 0 - 5 WBC/hpf   Bacteria, UA NONE SEEN NONE SEEN   Squamous Epithelial / LPF 0-5 (A) NONE SEEN   Mucous PRESENT   Brain natriuretic peptide     Status: None   Collection Time: 12/10/15 11:35 AM  Result Value Ref Range   B Natriuretic Peptide 6.0 0.0 - 100.0 pg/mL  Pregnancy, urine     Status: None   Collection Time: 12/10/15 11:35 AM  Result  Value Ref Range   Preg Test, Ur NEGATIVE NEGATIVE  Fibrin derivatives D-Dimer     Status: None   Collection Time: 12/10/15 11:35 AM  Result Value Ref Range   Fibrin derivatives D-dimer ([facility]) 431 0 - 499    Comment: <> Exclusion of Venous Thromboembolism (VTE) - OUTPATIENTS ONLY        (Emergency Department or [place])             0-499 ng/ml (FEU)  : With a low to intermediate pretest                                        probability for VTE this test result                                        excludes the diagnosis of VTE.           > 499 ng/ml (FEU)  : VTE not excluded.  Additional work up                                   for VTE is required.   <>  Testing on Inpatients and Evaluation of Disseminated Intravascular        Coagulation (DIC)             Reference Range:   0-499 ng/ml (FEU)   Glucose, capillary     Status: Abnormal   Collection Time: 12/10/15  1:00 PM  Result Value Ref Range   Glucose-Capillary 61 (L) 65 - 99 mg/dL  Troponin I     Status: None   Collection Time: 12/10/15  2:17 PM  Result Value Ref Range   Troponin I <0.03 <0.031 ng/mL    Comment:        NO INDICATION OF MYOCARDIAL INJURY.   Lactic acid, plasma     Status: None   Collection Time: 12/10/15  2:17 PM  Result Value Ref Range   Lactic Acid, Venous 1.2 0.5 - 2.0 mmol/L  Hepatic function panel  Status: Abnormal   Collection Time: 12/10/15  2:17 PM  Result Value Ref Range   Total Protein 8.0 6.5 - 8.1 g/dL   Albumin 4.3 3.5 - 5.0 g/dL   AST 21 15 - 41 U/L   ALT 21 14 - 54 U/L   Alkaline Phosphatase 51 38 - 126 U/L   Total Bilirubin 0.5 0.3 - 1.2 mg/dL   Bilirubin, Direct <0.1 (L) 0.1 - 0.5 mg/dL   Indirect Bilirubin NOT CALCULATED 0.3 - 0.9 mg/dL  ECHO COMPLETE     Status: None   Collection Time: 12/10/15  3:55 PM  Result Value Ref Range   Weight 3376 oz   Height 62 in   BP 118/79 mmHg  Lactic acid, plasma     Status: None   Collection Time: 12/10/15  4:35 PM  Result Value Ref  Range   Lactic Acid, Venous 0.9 0.5 - 2.0 mmol/L  Troponin I     Status: None   Collection Time: 12/10/15  7:53 PM  Result Value Ref Range   Troponin I <0.03 <0.031 ng/mL    Comment:        NO INDICATION OF MYOCARDIAL INJURY.   Troponin I     Status: None   Collection Time: 12/11/15  2:19 AM  Result Value Ref Range   Troponin I <0.03 <0.031 ng/mL    Comment:        NO INDICATION OF MYOCARDIAL INJURY.   Miscellaneous [facility] test (send-out)     Status: None   Collection Time: 12/11/15  2:19 AM  Result Value Ref Range   [facility] test code 4051    [facility] test name CORTISOL    Source ([facility]) 0.8ML SERUM REFRIG    Misc [facility] result COMMENT     Comment: (NOTE) Test Ordered: [id number] Cortisol Cortisol                       1.5              ug/dL    BN                          Cortisol AM         6.2 - 19.4                        Cortisol PM         2.3 - 11.9 Performed At: [facility] [place], [place] [id number] Lindon Romp MD [id number] Performed at [facility]   TSH     Status: None   Collection Time: 12/11/15  2:23 AM  Result Value Ref Range   TSH 2.721 0.350 - 4.500 uIU/mL  Basic metabolic panel     Status: Abnormal   Collection Time: 12/11/15  2:23 AM  Result Value Ref Range   Sodium 137 135 - 145 mmol/L   Potassium 3.2 (L) 3.5 - 5.1 mmol/L   Chloride 105 101 - 111 mmol/L   CO2 25 22 - 32 mmol/L   Glucose, Bld 111 (H) 65 - 99 mg/dL   BUN 10 6 - 20 mg/dL   Creatinine, Ser 0.79 0.44 - 1.00 mg/dL   Calcium 9.0 8.9 - 10.3 mg/dL   GFR calc non Af Amer >60 >60 mL/min   GFR calc Af Amer >60 >60 mL/min    Comment: (NOTE) The eGFR has been calculated using the  CKD EPI equation. This calculation has not been validated in all clinical situations. eGFR's persistently <60 mL/min signify possible Chronic Kidney Disease.    Anion gap 7 5 - 15  CBC     Status: Abnormal   Collection Time: 12/11/15  2:23 AM  Result Value Ref Range    WBC 8.0 3.6 - 11.0 K/uL   RBC 4.22 3.80 - 5.20 MIL/uL   Hemoglobin 11.9 (L) 12.0 - 16.0 g/dL   HCT 35.4 35.0 - 47.0 %   MCV 84.0 80.0 - 100.0 fL   MCH 28.2 26.0 - 34.0 pg   MCHC 33.6 32.0 - 36.0 g/dL   RDW 14.5 11.5 - 14.5 %   Platelets 276 150 - 440 K/uL  TSH     Status: None   Collection Time: 12/12/15  5:59 AM  Result Value Ref Range   TSH 0.969 0.350 - 4.500 uIU/mL  Cortisol-am, blood     Status: None   Collection Time: 12/12/15  5:59 AM  Result Value Ref Range   Cortisol - AM 15.1 6.7 - 22.6 ug/dL    Comment: Performed at [facility]  Vitamin B1     Status: None   Collection Time: 12/12/15  1:46 PM  Result Value Ref Range   Vitamin B1 (Thiamine) 117.8 66.5 - 200.0 nmol/L    Comment: (NOTE) Performed At: [facility] [address] [place], [place] [id number] Lindon Romp MD [id number]   Homocysteine     Status: None   Collection Time: 12/12/15  1:46 PM  Result Value Ref Range   Homocysteine 13.4 0.0 - 15.0 umol/L    Comment: (NOTE) Performed At: [facility] [address] [place], [place] [id number] Lindon Romp MD [id number]   Protime-INR     Status: None   Collection Time: 12/12/15  1:46 PM  Result Value Ref Range   Prothrombin Time 14.1 11.4 - 15.0 seconds   INR 1.07   APTT     Status: None   Collection Time: 12/12/15  1:46 PM  Result Value Ref Range   aPTT 30 24 - 36 seconds  Sedimentation rate     Status: Abnormal   Collection Time: 12/12/15  1:46 PM  Result Value Ref Range   Sed Rate 41 (H) 0 - 20 mm/hr  C-reactive protein     Status: None   Collection Time: 12/12/15  1:46 PM  Result Value Ref Range   CRP 0.8 <1.0 mg/dL    Comment: Performed at [facility] antibodies     Status: None   Collection Time: 12/12/15  1:46 PM  Result Value Ref Range   B burgdorferi Ab IgG+IgM <0.91 0.00 - 0.90 ISR    Comment: (NOTE)                                Negative         <0.91                                 Equivocal  0.91 - 1.09                                Positive         >1.09 Performed At: [facility] [address]  [place], [place] [id number] Lindon Romp MD [id number]   Vitamin B12     Status: None   Collection Time: 12/12/15  1:46 PM  Result Value Ref Range   Vitamin B-12 312 180 - 914 pg/mL    Comment: (NOTE) This assay is not validated for testing neonatal or myeloproliferative syndrome specimens for Vitamin B12 levels. Performed at [place] metabolic panel     Status: Abnormal   Collection Time: 12/13/15  4:09 AM  Result Value Ref Range   Sodium 137 135 - 145 mmol/L   Potassium 3.7 3.5 - 5.1 mmol/L   Chloride 106 101 - 111 mmol/L   CO2 26 22 - 32 mmol/L   Glucose, Bld 100 (H) 65 - 99 mg/dL   BUN 12 6 - 20 mg/dL   Creatinine, Ser 0.90 0.44 - 1.00 mg/dL   Calcium 9.0 8.9 - 10.3 mg/dL   GFR calc non Af Amer >60 >60 mL/min   GFR calc Af Amer >60 >60 mL/min    Comment: (NOTE) The eGFR has been calculated using the CKD EPI equation. This calculation has not been validated in all clinical situations. eGFR's persistently <60 mL/min signify possible Chronic Kidney Disease.    Anion gap 5 5 - 15  Protein and glucose, CSF     Status: None   Collection Time: 12/13/15 10:00 AM  Result Value Ref Range   Glucose, CSF 60 40 - 70 mg/dL   Total  Protein, CSF 36 15 - 45 mg/dL  Oligoclonal bands, CSF + serm     Status: None   Collection Time: 12/13/15 10:00 AM  Result Value Ref Range   CSF Oligoclonal Bands (NOTE)     Comment: Performed At: [facility] [place], [place] [id number] [place] Romp MD [id number]   CSF IgG index     Status: None   Collection Time: 12/13/15 10:00 AM  Result Value Ref Range   IgG, CSF 2.7 0.0 - 8.6 mg/dL    Comment: (NOTE) Performed At: [facility] [address] [place], [place] [id number] Lindon Romp MD [id number]   Myelin basic protein, CSF     Status:  None   Collection Time: 12/13/15 10:00 AM  Result Value Ref Range   Myelin Basic Protein 0.8 0.0 - 1.2 ng/mL    Comment: (NOTE) Results for this test are for research purposes only by the assay's manufacturer.  The performance characteristics of this product have not been established.  Results should not be used as a diagnostic procedure without confirmation of the diagnosis by another medically established diagnostic product or procedure. Performed At: [facility], [place] [id number] Lindon Romp MD [id number]   B. burgdorfi antibodies, CSF     Status: None (Preliminary result)   Collection Time: 12/13/15 10:00 AM  Result Value Ref Range   Lyme Ab PENDING    B. Burgdorferi IgM Ab Index PENDING    B. Burgdorferi IgG Serum PENDING    B. burgdorferi IgG CSF PENDING    B. Burgdorferi IgM Serum PENDING    B. burgdorferi IgM CSF PENDING    IgG Total Serum PENDING    IgM Total Serum PENDING    Albumin Serum PENDING    IgG Total CSF PENDING    IgM Total CSF PENDING    Albumin CSF PENDING    Albumin Index Ratio PENDING    IgG Index PENDING    IgM Index PENDING  IgG (loc) PENDING    IgM (loc) PENDING    CNS IgG Synthesis Rate (NOTE)     Comment: Performed At: [facility] [place], [place] [id number] Tinghitella Gwen Her PhD [id number]   CSF cell count with differential collection tube #: 1     Status: Abnormal   Collection Time: 12/13/15 10:00 AM  Result Value Ref Range   Tube # 1    Color, CSF COLORLESS COLORLESS   Appearance, CSF CLEAR (A) CLEAR   RBC Count, CSF 166 (H) 0 - 3 /cu mm   WBC, CSF 17 /cu mm   Segmented Neutrophils-CSF 0 %   Lymphs, CSF 91 %   Monocyte-Macrophage-Spinal Fluid 9 %   Eosinophils, CSF 0 %  CSF culture     Status: None   Collection Time: 12/13/15 10:00 AM  Result Value Ref Range   Specimen Description CSF    Special Requests Normal    Gram Stain      FEW RED BLOOD  CELLS RARE WBC SEEN NO ORGANISMS SEEN    Culture NO GROWTH 3 DAYS    Report Status 12/16/2015 FINAL   VDRL, CSF     Status: None   Collection Time: 12/13/15 10:00 AM  Result Value Ref Range   VDRL Quant, CSF Non Reactive Non Rea:<1:1    Comment: (NOTE) Performed At: [facility] [place], [place] [id number] [facility] Romp MD [id number]       PHQ2/9: Depression screen [facility] 2/9 12/05/2015 04/20/2015  Decreased Interest 0 0  Down, Depressed, Hopeless 0 0  PHQ - 2 Score 0 0     Fall Risk: Fall Risk  12/05/2015 04/20/2015 03/10/2015 01/18/2015  Falls in the past year? No No No No      Assessment & Plan  1. Abnormal brain MRI  Needs to keep follow up with neurologist, and elavil for headache. Because of her symptoms advised her to stay out of work until seen by all sub-specialist. Return to work around June 30th, 2017  2. Dizziness  She is afraid of driving, still gets dizzy and medications are causing her to feel sleepy, advised to stay off work until the end of the month and improvement of symptoms  - Ambulatory referral to Physical Therapy - Ambulatory referral to ENT  3. Idiopathic hypotension  Unknown   4. Weakness  - Ambulatory referral to Physical Therapy - Ambulatory referral to ENT  5. Migraine with aura and without status migrainosus, not intractable  Continue Elavil, take Valium only prn, not daily for prevention  6. Hypokalemia  - Potassium Normal cortisol level

## 2015-12-21 ENCOUNTER — Other Ambulatory Visit: Payer: Self-pay

## 2015-12-22 LAB — CSF IGG: IgG, CSF: 2.7 mg/dL (ref 0.0–8.6)

## 2015-12-22 LAB — B. BURGDORFI ANTIBODIES, CSF
Albumin CSF: 18.6 mg/dL (ref 10.4–43.2)
Albumin Index Ratio: 4.8
Albumin Serum: 3900 mg/dl (ref 3700–5100)
B. BURGDORFERI IGM SERUM: 0.27 {index} (ref 0.00–0.80)
B. Burgdorferi IgG Serum: <0.2 Index (ref 0.00–0.80)
B. burgdorferi IgM CSF: <0.06 Index (ref 0.00–0.06)
CNS IGG SYNTHESIS RATE: 0.6
IGG INDEX: 0.63
IGG TOTAL CSF: 2.8 mg/dL (ref 0.00–3.06)
IgG Total Serum: 928 mg/dl (ref 540–1423)
IgM Total Serum: 96 mg/dl (ref 52–217)

## 2015-12-22 LAB — VDRL, CSF: SYPHILIS VDRL QUANT CSF: NONREACTIVE

## 2015-12-22 LAB — OLIGOCLONAL BANDS, CSF + SERM

## 2015-12-23 ENCOUNTER — Other Ambulatory Visit: Payer: Self-pay | Admitting: *Deleted

## 2015-12-23 ENCOUNTER — Encounter: Payer: Self-pay | Admitting: Family Medicine

## 2015-12-23 ENCOUNTER — Encounter: Payer: Self-pay | Admitting: *Deleted

## 2015-12-23 NOTE — Patient Outreach (Addendum)
[facility]) Care Management  12/23/2015  Marie Vaughan 11/14/1970 [id number]  Subjective: Telephone call to patient's home number, spoke with patient, and HIPAA verified.  Patient states she is feeling good and moving slowly. Patient gave [facility] verbal authorization to speak with husband Marie Vaughan) regarding healthcare needs as needed.    Discussed [facility] Care Management program and patient in agreement to complete transition of care follow up call. Patient' states MD has recommended obtaining a cane to assist with balance.   States she will follow up with insurance company regarding benefit coverage for cane and obtain from local provider.  States she has been approved for [employer] Act, short term disability, and is scheduled to be out of work until  01/20/16.   Patient states physical therapy was recommended,  her MD has approved for patient to continue to go to a local gym to exercise with friend  Instead of outpatient therapy.   Patient states she will have cardiology follow up appointment on 01/03/16 and neurologist appointment on 01/10/16.   States her primary MD office is working on obtaining an appointment with ENT and will call her when it has been set up.   Patient states she does not have any care coordination or transition of care follow up needs at this time.   Patient verbal given RNCM's contact information, 24 hour Nurse Advice line number, and [facility] Care Management main phone number for future reference.   Objective: Per chart review: Patient hospitalized  12/10/15 - 12/15/15 for Idiopathic hypotension.   Patient has a history of hypertension, GERD, and anemia.    Assessment: Received [facility] Transition of care referral on 12/20/15.    Plan:  RNCM will send patient's primary MD case closure letter due to transition of care follow up completion.   Suhaib Guzzo H. Annia Friendly, BSN, [facility] Telephonic CM Phone: [phone] Fax: [phone]

## 2015-12-28 DIAGNOSIS — S83241A Other tear of medial meniscus, current injury, right knee, initial encounter: Secondary | ICD-10-CM | POA: Diagnosis not present

## 2015-12-28 DIAGNOSIS — M170 Bilateral primary osteoarthritis of knee: Secondary | ICD-10-CM | POA: Diagnosis not present

## 2015-12-28 DIAGNOSIS — E876 Hypokalemia: Secondary | ICD-10-CM | POA: Diagnosis not present

## 2015-12-28 DIAGNOSIS — S83242A Other tear of medial meniscus, current injury, left knee, initial encounter: Secondary | ICD-10-CM | POA: Diagnosis not present

## 2015-12-29 LAB — POTASSIUM: Potassium: 3.7 mmol/L (ref 3.5–5.2)

## 2016-01-02 ENCOUNTER — Telehealth: Payer: Self-pay

## 2016-01-02 DIAGNOSIS — I679 Cerebrovascular disease, unspecified: Secondary | ICD-10-CM | POA: Diagnosis not present

## 2016-01-02 DIAGNOSIS — I10 Essential (primary) hypertension: Secondary | ICD-10-CM

## 2016-01-02 DIAGNOSIS — R0683 Snoring: Secondary | ICD-10-CM | POA: Diagnosis not present

## 2016-01-02 DIAGNOSIS — G43109 Migraine with aura, not intractable, without status migrainosus: Secondary | ICD-10-CM | POA: Diagnosis not present

## 2016-01-02 DIAGNOSIS — E669 Obesity, unspecified: Secondary | ICD-10-CM | POA: Diagnosis not present

## 2016-01-02 MED ORDER — HYDROCHLOROTHIAZIDE 12.5 MG PO CAPS: 12.5000 mg | ORAL_CAPSULE | Freq: Every day | ORAL | Status: DC

## 2016-01-02 NOTE — Telephone Encounter (Signed)
Patient stated that she went to Dr. Manuella Ghazi today and he wants to proceed with an EEG and a sleep study. He told her that he wanted her to take a Aspirin everyday and he wanted her to try a medication (Topamax) to see if it helped.   She was only written on until the 30th but Dr. Manuella Ghazi does not want her to go back until she has tried the Topamax and has followed up with him which is not until 3-4 weeks later.   Patient was scheduled for a f/u with Dr. Ancil Boozer on 01/20/16 @ 8:20am.  Patient then asked if she was to restart her BP meds (Benicar 20/12.5) since her BP was 145/89 at Dr. Trena Platt office, after consulting with Dr. Ancil Boozer, she was encouraged to take HCTZ 12.5 that will be called to her pharmacy [facility]) and to keep a daily log of her blood pressure.  She agreed and said thanks.

## 2016-01-03 ENCOUNTER — Encounter: Payer: Self-pay | Admitting: Family Medicine

## 2016-01-03 ENCOUNTER — Ambulatory Visit (INDEPENDENT_AMBULATORY_CARE_PROVIDER_SITE_OTHER): Payer: 59 | Admitting: Cardiovascular Disease

## 2016-01-03 ENCOUNTER — Encounter: Payer: Self-pay | Admitting: Cardiovascular Disease

## 2016-01-03 VITALS — BP 133/88 | HR 89 | Ht 62.0 in | Wt 218.8 lb

## 2016-01-03 DIAGNOSIS — G43809 Other migraine, not intractable, without status migrainosus: Secondary | ICD-10-CM | POA: Insufficient documentation

## 2016-01-03 DIAGNOSIS — G43109 Migraine with aura, not intractable, without status migrainosus: Secondary | ICD-10-CM

## 2016-01-03 DIAGNOSIS — R55 Syncope and collapse: Secondary | ICD-10-CM

## 2016-01-03 DIAGNOSIS — R079 Chest pain, unspecified: Secondary | ICD-10-CM

## 2016-01-03 NOTE — Patient Instructions (Signed)
Medication Instructions: Continue same medications.   Labwork: None.   Procedures/Testing: None.   Follow-Up: As needed with Dr. Arida.   Any Additional Special Instructions Will Be Listed Below (If Applicable).     If you need a refill on your cardiac medications before your next appointment, please call your pharmacy.   

## 2016-01-03 NOTE — Progress Notes (Signed)
Cardiology Office Note   Date:  01/03/2016   ID:  Marie Vaughan, DOB Jan 08, 1971, MRN [id number]  PCP:  Loistine Chance, MD  Cardiologist:   Kathlyn Sacramento, MD   Chief Complaint  Patient presents with  . Other    F/u hospital c/o dizziness/syncope. Meds reviewed verbally with pt.      History of Present Illness: Marie Vaughan is a 45 y.o. female who presents for a followup visit regarding chest pain and dizziness.  She has prolonged history of hypertension which has been reasonably controlled, gastroesophageal reflux disease and obesity.  She was seen In 2015 for recurrent chest pain in spite of negative noninvasive cardiac workup. Cardiac catheterization in 04/2014 showed normal coronary arteries and ejection fraction of 60%. Left ventricular end-diastolic pressure was mildly elevated.  She was hospitalized recently for hypotension, dizziness and headache. She had an echocardiogram done which was unremarkable. MRI of the brain showed white matter abnormalities. She was seen by neurology and underwent lumbar puncture which was unremarkable. She had problems with severe headache and poor balance. She was seen by Dr. Manuella Ghazi as an outpatient and she was diagnosed with vestibular migraine with possible sleep apnea.  Past Medical History  Diagnosis Date  . Vaginal delivery     x 3  . Angio-edema   . HTN (hypertension)   . GERD (gastroesophageal reflux disease)   . Acne   . Anxiety   . Contact lens/glasses fitting     wears contacts or glasses  . Tear of medial meniscus of left knee   . Tear of medial meniscus of right knee   . Iron deficiency anemia secondary to blood loss (chronic)   . Migraine with aura, without mention of intractable migraine without mention of status migrainosus   . Acute bronchitis   . Pain in thoracic spine   . Pharyngitis   . Dysmenorrhea   . Anxiety state, unspecified   . 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 Cervicalgia   . Obesity   .  History of stress test     a. Lexiscan 08/25/13: no sig ischemia, attenuation artifact noted, no sig WMA noted, EF 69%, no EKG changes concerning for ischemia  . History of cardiac cath     a. 04/29/2014: LM nl, LAD no dz, D1 no dz, D2 very small vessel, D3 very small vessel, LCx nl, OM1 very small vessel, OM2 medium sized vessel, OM 3 no dz, RCA no dz, PDA no dz     Past Surgical History  Procedure Laterality Date  . Ovarian cyst removal  04-25-10  . Abdominal hysterectomy  04-25-10    Due to abnormal PAP  . Knee arthroscopy with medial menisectomy Right 06/10/2013    Procedure: RIGHT KNEE ARTHROSCOPY WITH MEDIAL MENISECTOMY;  Surgeon: Lorn Junes, MD;  Location: [place];  Service: Orthopedics;  Laterality: Right;  partial lateral menisectomoy and chondroplasty  . Knee arthroscopy Left 06/10/2013    Procedure: LEFT KNEE ARTHROSCOPY KNEE WITH PARTIAL MEDIAL MENISCECTOMY ;  Surgeon: Lorn Junes, MD;  Location: [place];  Service: Orthopedics;  Laterality: Left;  xerofoam, 4x4's, webril, ace wrap, ice wrap  . Dilation and curettage of uterus    . Cardiac catheterization  04/2014    [facility]     Current Outpatient Prescriptions  Medication Sig Dispense Refill  . albuterol (PROVENTIL HFA;VENTOLIN HFA) 108 (90 Base) MCG/ACT inhaler Inhale 2 puffs into the lungs every 6 (  six) hours as needed for wheezing or shortness of breath. 1 Inhaler 0  . ALPRAZolam (XANAX) 0.5 MG tablet TAKE 1 TABLET BY MOUTH TWICE DAILY AS NEEDED FOR ANXIETY. 30 tablet 5  . amitriptyline (ELAVIL) 10 MG tablet Take 1 tablet (10 mg total) by mouth at bedtime. 30 tablet 0  . aspirin EC 81 MG tablet Take 1 tablet (81 mg total) by mouth daily. 90 tablet 3  . diazepam (VALIUM) 5 MG tablet Take 1 tablet (5 mg total) by mouth every 12 (twelve) hours as needed for anxiety (HEADACHE). 20 tablet 0  . Elastic Bandages & Supports (MEDICAL COMPRESSION STOCKINGS) MISC 2 Units by Does not apply route  daily. 2 each 0  . esomeprazole (NEXIUM) 40 MG capsule Take 1 capsule (40 mg total) by mouth daily before breakfast.    . hydrochlorothiazide (MICROZIDE) 12.5 MG capsule Take 1 capsule (12.5 mg total) by mouth daily. 30 capsule 0  . meclizine (ANTIVERT) 25 MG tablet Take 1 tablet (25 mg total) by mouth 3 (three) times daily as needed for dizziness. 30 tablet 0  . senna (SENOKOT) 8.6 MG TABS tablet Take 2 tablets (17.2 mg total) by mouth daily. 120 each 0   No current facility-administered medications for this visit.    Allergies:   Chocolate; Iohexol; and Peanut-containing drug products    Social History:  The patient  reports that she has never smoked. She has never used smokeless tobacco. She reports that she does not drink alcohol or use illicit drugs.   Family History:  The patient's family history includes Alcohol abuse in her maternal uncle and other; Arthritis in her other; Diabetes in her maternal aunt and mother; Early death in her mother; Early death (age of onset: 42) in her sister; Gout in her mother; Heart disease in her other; Hyperlipidemia in her maternal aunt; Hypertension in her maternal aunt and mother; Kidney failure in her mother; Other in her other; Prostate cancer in her maternal uncle.    ROS:  Please see the history of present illness.   Otherwise, review of systems are positive for none.   All other systems are reviewed and negative.    PHYSICAL EXAM: VS:  BP 133/88 mmHg  Pulse 89  Ht 5\' 2"  (1.575 m)  Wt 218 lb 12 oz (99.224 kg)  BMI 40.00 kg/m2 , BMI Body mass index is 40 kg/(m^2). GEN: Well nourished, well developed, in no acute distress HEENT: normal Neck: no JVD, carotid bruits, or masses Cardiac: RRR; no murmurs, rubs, or gallops,no edema  Respiratory:  clear to auscultation bilaterally, normal work of breathing GI: soft, nontender, nondistended, + BS MS: no deformity or atrophy Skin: warm and dry, no rash Neuro:  Strength and sensation are  intact Psych: euthymic mood, full affect   EKG:  EKG is ordered today. The ekg ordered today demonstrates normal sinus rhythm with no significant ST or T wave changes   Recent Labs: 12/10/2015: ALT 21; B Natriuretic Peptide 6.0 12/11/2015: Hemoglobin 11.9*; Platelets 276 12/12/2015: TSH 0.969 12/13/2015: BUN 12; Creatinine, Ser 0.90; Sodium 137 12/28/2015: Potassium 3.7    Lipid Panel No results found for: CHOL, TRIG, HDL, CHOLHDL, VLDL, LDLCALC, LDLDIRECT    Wt Readings from Last 3 Encounters:  01/03/16 218 lb 12 oz (99.224 kg)  12/20/15 218 lb 3.2 oz (98.975 kg)  12/10/15 209 lb (94.802 kg)       ASSESSMENT AND PLAN:  1.  Atypical chest pain:  This is chronic and does not  seem to be cardiac. Negative cardiac catheterization in the past and recent echocardiogram was unremarkable. I do not recommend any further cardiac workup.  2. Essential hypertension:  Hydrochlorothiazide was resumed recently.  3. Vestibular migraine: She is being followed by neurology.    Disposition:   FU with me prn as she does not have active cardiac issues.   Signed,  Kathlyn Sacramento, MD  01/03/2016 2:14 PM    [facility]

## 2016-01-07 DIAGNOSIS — R42 Dizziness and giddiness: Secondary | ICD-10-CM | POA: Diagnosis not present

## 2016-01-07 DIAGNOSIS — R55 Syncope and collapse: Secondary | ICD-10-CM | POA: Diagnosis not present

## 2016-01-10 ENCOUNTER — Telehealth: Payer: Self-pay

## 2016-01-10 NOTE — Telephone Encounter (Signed)
Patient stated that her disability will run out on the 20th since it stated her expected return to work day was 12/11/15. She has an appt on 12/12/15 and will need her notes faxed over so her coverage will be continued until sh has been cleared by her neurologist.  Patient stated that she checked her blood pressure yesterday and it was 152/98 without taking the Benicar. I encouraged her to recheck it today and if it was still elevated then she should take her BP meds. She agreed and said ok.

## 2016-01-11 ENCOUNTER — Ambulatory Visit: Payer: 59 | Admitting: Family Medicine

## 2016-01-12 ENCOUNTER — Encounter: Payer: Self-pay | Admitting: Family Medicine

## 2016-01-12 ENCOUNTER — Ambulatory Visit: Payer: 59 | Admitting: Family Medicine

## 2016-01-12 ENCOUNTER — Ambulatory Visit (INDEPENDENT_AMBULATORY_CARE_PROVIDER_SITE_OTHER): Payer: 59 | Admitting: Family Medicine

## 2016-01-12 VITALS — BP 108/76 | HR 110 | Temp 97.5°F | Resp 18 | Ht 62.0 in | Wt 214.2 lb

## 2016-01-12 DIAGNOSIS — G43109 Migraine with aura, not intractable, without status migrainosus: Secondary | ICD-10-CM

## 2016-01-12 DIAGNOSIS — G43809 Other migraine, not intractable, without status migrainosus: Secondary | ICD-10-CM

## 2016-01-12 DIAGNOSIS — I10 Essential (primary) hypertension: Secondary | ICD-10-CM | POA: Diagnosis not present

## 2016-01-12 DIAGNOSIS — R0683 Snoring: Secondary | ICD-10-CM

## 2016-01-12 DIAGNOSIS — R930 Abnormal findings on diagnostic imaging of skull and head, not elsewhere classified: Secondary | ICD-10-CM

## 2016-01-12 DIAGNOSIS — R9089 Other abnormal findings on diagnostic imaging of central nervous system: Secondary | ICD-10-CM

## 2016-01-12 NOTE — Progress Notes (Signed)
Name: Marie Vaughan   MRN: [id number]    DOB: [birth year]   Date:01/12/2016       Progress Note  Subjective  Chief Complaint  Chief Complaint  Patient presents with  . FMLA    Patient needs a extension for staying out of work due to waiting on results from Neurology. Patient had sleep study done and was told she was receiving too little O2 to her brain and was put on seizure medication. Also patient follow up with Neurology and had EEG perform Saturday and waiting on results, has follow up on July 17.    HPI  Hospital Follow up: she went to [facility] on 05/20th because of near syncopal episode. Her bp was low, she was admitted with diagnosed of idiopathic hypotension and discharged with diagnosed of abnormal MRI - unknown etiology, elevated sed rate, dizziness and was given elavil for headache prevention and valium prn. She has been out of work since May 19 th, 2017. She was seen by Dr. Manuella Ghazi - neurologist - and has been diagnosed with Vestibular migraines and started on Topamax 01/02/2016, based on his notes he does not think that the abnormal MRI imaging is secondary to MS but from microvascular disease and was advised to take aspirin and statin therapy. She recently had EEG but results is pending. She will have a sleep study done on 01/17/2016 since she snores and has high ESS of 17/24. She no longer wakes up with dizziness. Stopped 2 days ago. However still feels tired, has to sleep in ( likely side effects of Topamax) and when out and active for a couple of hours she develops a headache and has to go to home to rest.   HTN: back on HCTZ, bp , she states bp at local pharmacy was in the 140's/80's, she is no longer on Benicar, switched to HCTZ and denies side effects, bp is at goal today, states not dizzy from it.     Patient Active Problem List   Diagnosis Date Noted  . 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 Abnormal brain MRI 12/20/2015  . Dizziness 12/13/2015  . Near syncope 12/10/2015  .  Atypical chest pain 08/22/2013  . Essential hypertension 08/22/2013  . Angio-edema   . GERD (gastroesophageal reflux disease)   . Migraine with aura and without status migrainosus   . Acne   . Anxiety   . Allergic rhinitis 10/05/2011  . Insomnia 08/30/2011  . Obesity 08/30/2011    Past Surgical History  Procedure Laterality Date  . Ovarian cyst removal  04-25-10  . Abdominal hysterectomy  04-25-10    Due to abnormal PAP  . Knee arthroscopy with medial menisectomy Right 06/10/2013    Procedure: RIGHT KNEE ARTHROSCOPY WITH MEDIAL MENISECTOMY;  Surgeon: Lorn Junes, MD;  Location: [place];  Service: Orthopedics;  Laterality: Right;  partial lateral menisectomoy and chondroplasty  . Knee arthroscopy Left 06/10/2013    Procedure: LEFT KNEE ARTHROSCOPY KNEE WITH PARTIAL MEDIAL MENISCECTOMY ;  Surgeon: Lorn Junes, MD;  Location: [place];  Service: Orthopedics;  Laterality: Left;  xerofoam, 4x4's, webril, ace wrap, ice wrap  . Dilation and curettage of uterus    . Cardiac catheterization  04/2014    [facility]    Family History  Problem Relation Age of Onset  . Early death Mother     Murdered when pt was 83 years old  . Diabetes Mother   . Hypertension Mother   . Gout Mother   .  Kidney failure Mother   . Early death Sister 89    due to hemorage  . Alcohol abuse Maternal Uncle   . Alcohol abuse Other   . Arthritis Other   . Heart disease Other   . Other Other     cousin- phlebitis  . Diabetes Maternal Aunt   . Hyperlipidemia Maternal Aunt   . Hypertension Maternal Aunt   . Prostate cancer Maternal Uncle     Social History   Social History  . Marital Status: Single    Spouse Name: N/A  . Number of Children: 3  . Years of Education: N/A   Occupational History  . Medical records - [facility]    Social History Main Topics  . Smoking status: Never Smoker   . Smokeless tobacco: Never Used  . Alcohol Use: No  . Drug  Use: No  . Sexual Activity:    Partners: Male   Other Topics Concern  . Not on file   Social History Narrative   Lives in [place] with fiance, and 2 children 2 and 16. Works at [employer].      Regular Exercise -  Walk, 30 - 1 hr daily   Daily Caffeine Use:  1 cup coffee AM (in cold weather)              Current outpatient prescriptions:  .  albuterol (PROVENTIL HFA;VENTOLIN HFA) 108 (90 Base) MCG/ACT inhaler, Inhale 2 puffs into the lungs every 6 (six) hours as needed for wheezing or shortness of breath., Disp: 1 Inhaler, Rfl: 0 .  ALPRAZolam (XANAX) 0.5 MG tablet, TAKE 1 TABLET BY MOUTH TWICE DAILY AS NEEDED FOR ANXIETY., Disp: 30 tablet, Rfl: 5 .  aspirin EC 81 MG tablet, Take 1 tablet (81 mg total) by mouth daily., Disp: 90 tablet, Rfl: 3 .  diazepam (VALIUM) 5 MG tablet, Take 1 tablet (5 mg total) by mouth every 12 (twelve) hours as needed for anxiety (HEADACHE)., Disp: 20 tablet, Rfl: 0 .  Elastic Bandages & Supports (MEDICAL COMPRESSION STOCKINGS) MISC, 2 Units by Does not apply route daily., Disp: 2 each, Rfl: 0 .  esomeprazole (NEXIUM) 40 MG capsule, Take 1 capsule (40 mg total) by mouth daily before breakfast., Disp: , Rfl:  .  hydrochlorothiazide (MICROZIDE) 12.5 MG capsule, Take 1 capsule (12.5 mg total) by mouth daily., Disp: 30 capsule, Rfl: 0 .  meclizine (ANTIVERT) 25 MG tablet, Take 1 tablet (25 mg total) by mouth 3 (three) times daily as needed for dizziness., Disp: 30 tablet, Rfl: 0 .  senna (SENOKOT) 8.6 MG TABS tablet, Take 2 tablets (17.2 mg total) by mouth daily., Disp: 120 each, Rfl: 0 .  topiramate (TOPAMAX) 25 MG capsule, Take 25 mg by mouth 2 (two) times daily., Disp: , Rfl:   Allergies  Allergen Reactions  . Chocolate     Hives, itching  . Iohexol     CP and SOB  . Peanut-Containing Drug Products Swelling    cashews     ROS  Constitutional: Negative for fever or weight change.  Respiratory: Negative for cough and shortness of breath.   Cardiovascular:  Negative for chest pain or palpitations.  Gastrointestinal: Negative for abdominal pain, no bowel changes.  Musculoskeletal: Negative for gait problem or joint swelling.  Skin: Negative for rash.  Neurological: Positive for dizziness and headache intermittently.  No other specific complaints in a complete review of systems (except as listed in HPI above).  Objective  Filed Vitals:   01/12/16  1147  BP: 108/76  Pulse: 110  Temp: 97.5 F (36.4 C)  TempSrc: Oral  Resp: 18  Height: _0  (1.575 m)  Weight: 214 lb 3.2 oz (97.16 kg)  SpO2: 96%    Body mass index is 39.17 kg/(m^2).  Physical Exam  Constitutional: Patient appears well-developed and well-nourished. Obese  No distress.  HEENT: head atraumatic, normocephalic, pupils equal and reactive to light,  neck supple, throat within normal limits Cardiovascular: Normal rate, regular rhythm and normal heart sounds.  No murmur heard. No BLE edema. Pulmonary/Chest: Effort normal and breath sounds normal. No respiratory distress. Abdominal: Soft.  There is no tenderness. Psychiatric: Patient has a normal mood and affect. behavior is normal. Judgment and thought content normal. Neurologist : normal grip, no nystagmus, no focal finding  Recent Results (from the past 2160 hour(s))  POCT Influenza A/B (POC66)     Status: Normal   Collection Time: 11/14/15  2:43 PM  Result Value Ref Range   Influenza A, POC Negative Negative   Influenza B, POC Negative Negative  CBC with Differential/Platelet     Status: Abnormal   Collection Time: 12/07/15  8:44 AM  Result Value Ref Range   WBC 6.5 3.4 - 10.8 x10E3/uL   RBC 3.99 3.77 - 5.28 x10E6/uL   Hemoglobin 11.2 11.1 - 15.9 g/dL   Hematocrit 33.9 (L) 34.0 - 46.6 %   MCV 85 79 - 97 fL   MCH 28.1 26.6 - 33.0 pg   MCHC 33.0 31.5 - 35.7 g/dL   RDW 13.9 12.3 - 15.4 %   Platelets 299 150 - 379 x10E3/uL   Neutrophils 61 %   Lymphs 31 %   Monocytes 6 %   Eos 1 %   Basos 0 %   Neutrophils  Absolute 4.0 1.4 - 7.0 x10E3/uL   Lymphocytes Absolute 2.0 0.7 - 3.1 x10E3/uL   Monocytes Absolute 0.4 0.1 - 0.9 x10E3/uL   EOS (ABSOLUTE) 0.1 0.0 - 0.4 x10E3/uL   Basophils Absolute 0.0 0.0 - 0.2 x10E3/uL   Immature Granulocytes 1 %   Immature Grans (Abs) 0.0 0.0 - 0.1 x10E3/uL  Comprehensive metabolic panel     Status: None   Collection Time: 12/07/15  8:44 AM  Result Value Ref Range   Glucose 93 65 - 99 mg/dL   BUN 10 6 - 24 mg/dL   Creatinine, Ser 0.77 0.57 - 1.00 mg/dL   GFR calc non Af Amer 93 >59 mL/min/1.73   GFR calc Af Amer 107 >59 mL/min/1.73   BUN/Creatinine Ratio 13 9 - 23   Sodium 141 134 - 144 mmol/L   Potassium 4.0 3.5 - 5.2 mmol/L   Chloride 103 96 - 106 mmol/L   CO2 23 18 - 29 mmol/L   Calcium 9.2 8.7 - 10.2 mg/dL   Total Protein 6.3 6.0 - 8.5 g/dL   Albumin 3.9 3.5 - 5.5 g/dL   Globulin, Total 2.4 1.5 - 4.5 g/dL   Albumin/Globulin Ratio 1.6 1.2 - 2.2   Bilirubin Total <0.2 0.0 - 1.2 mg/dL   Alkaline Phosphatase 57 39 - 117 IU/L   AST 14 0 - 40 IU/L   ALT 14 0 - 32 IU/L  TSH     Status: None   Collection Time: 12/07/15  8:44 AM  Result Value Ref Range   TSH 1.310 0.450 - 4.500 uIU/mL  Basic metabolic panel     Status: Abnormal   Collection Time: 12/10/15 11:35 AM  Result Value Ref Range  Sodium 136 135 - 145 mmol/L   Potassium 3.3 (L) 3.5 - 5.1 mmol/L   Chloride 103 101 - 111 mmol/L   CO2 25 22 - 32 mmol/L   Glucose, Bld 89 65 - 99 mg/dL   BUN 12 6 - 20 mg/dL   Creatinine, Ser 0.76 0.44 - 1.00 mg/dL   Calcium 9.9 8.9 - 10.3 mg/dL   GFR calc non Af Amer >60 >60 mL/min   GFR calc Af Amer >60 >60 mL/min    Comment: (NOTE) The eGFR has been calculated using the CKD EPI equation. This calculation has not been validated in all clinical situations. eGFR's persistently <60 mL/min signify possible Chronic Kidney Disease.    Anion gap 8 5 - 15  CBC     Status: None   Collection Time: 12/10/15 11:35 AM  Result Value Ref Range   WBC 8.1 3.6 - 11.0 K/uL    RBC 4.51 3.80 - 5.20 MIL/uL   Hemoglobin 12.7 12.0 - 16.0 g/dL   HCT 37.7 35.0 - 47.0 %   MCV 83.6 80.0 - 100.0 fL   MCH 28.1 26.0 - 34.0 pg   MCHC 33.6 32.0 - 36.0 g/dL   RDW 14.2 11.5 - 14.5 %   Platelets 301 150 - 440 K/uL  Urinalysis complete, with microscopic     Status: Abnormal   Collection Time: 12/10/15 11:35 AM  Result Value Ref Range   Color, Urine YELLOW (A) YELLOW   APPearance CLEAR (A) CLEAR   Glucose, UA NEGATIVE NEGATIVE mg/dL   Bilirubin Urine NEGATIVE NEGATIVE   Ketones, ur NEGATIVE NEGATIVE mg/dL   Specific Gravity, Urine 1.015 1.005 - 1.030   Hgb urine dipstick NEGATIVE NEGATIVE   pH 6.0 5.0 - 8.0   Protein, ur NEGATIVE NEGATIVE mg/dL   Nitrite NEGATIVE NEGATIVE   Leukocytes, UA NEGATIVE NEGATIVE   RBC / HPF NONE SEEN 0 - 5 RBC/hpf   WBC, UA 0-5 0 - 5 WBC/hpf   Bacteria, UA NONE SEEN NONE SEEN   Squamous Epithelial / LPF 0-5 (A) NONE SEEN   Mucous PRESENT   Brain natriuretic peptide     Status: None   Collection Time: 12/10/15 11:35 AM  Result Value Ref Range   B Natriuretic Peptide 6.0 0.0 - 100.0 pg/mL  Pregnancy, urine     Status: None   Collection Time: 12/10/15 11:35 AM  Result Value Ref Range   Preg Test, Ur NEGATIVE NEGATIVE  Fibrin derivatives D-Dimer     Status: None   Collection Time: 12/10/15 11:35 AM  Result Value Ref Range   Fibrin derivatives D-dimer ([facility]) 431 0 - 499    Comment: <> Exclusion of Venous Thromboembolism (VTE) - OUTPATIENTS ONLY        (Emergency Department or [place])             0-499 ng/ml (FEU)  : With a low to intermediate pretest                                        probability for VTE this test result                                        excludes the diagnosis of VTE.           > 499  ng/ml (FEU)  : VTE not excluded.  Additional work up                                   for VTE is required.   <>  Testing on Inpatients and Evaluation of Disseminated Intravascular        Coagulation (DIC)              Reference Range:   0-499 ng/ml (FEU)   Glucose, capillary     Status: Abnormal   Collection Time: 12/10/15  1:00 PM  Result Value Ref Range   Glucose-Capillary 61 (L) 65 - 99 mg/dL  Troponin I     Status: None   Collection Time: 12/10/15  2:17 PM  Result Value Ref Range   Troponin I <0.03 <0.031 ng/mL    Comment:        NO INDICATION OF MYOCARDIAL INJURY.   Lactic acid, plasma     Status: None   Collection Time: 12/10/15  2:17 PM  Result Value Ref Range   Lactic Acid, Venous 1.2 0.5 - 2.0 mmol/L  Hepatic function panel     Status: Abnormal   Collection Time: 12/10/15  2:17 PM  Result Value Ref Range   Total Protein 8.0 6.5 - 8.1 g/dL   Albumin 4.3 3.5 - 5.0 g/dL   AST 21 15 - 41 U/L   ALT 21 14 - 54 U/L   Alkaline Phosphatase 51 38 - 126 U/L   Total Bilirubin 0.5 0.3 - 1.2 mg/dL   Bilirubin, Direct <0.1 (L) 0.1 - 0.5 mg/dL   Indirect Bilirubin NOT CALCULATED 0.3 - 0.9 mg/dL  ECHO COMPLETE     Status: None   Collection Time: 12/10/15  3:55 PM  Result Value Ref Range   Weight 3376 oz   Height 62 in   BP 118/79 mmHg  Lactic acid, plasma     Status: None   Collection Time: 12/10/15  4:35 PM  Result Value Ref Range   Lactic Acid, Venous 0.9 0.5 - 2.0 mmol/L  Troponin I     Status: None   Collection Time: 12/10/15  7:53 PM  Result Value Ref Range   Troponin I <0.03 <0.031 ng/mL    Comment:        NO INDICATION OF MYOCARDIAL INJURY.   Troponin I     Status: None   Collection Time: 12/11/15  2:19 AM  Result Value Ref Range   Troponin I <0.03 <0.031 ng/mL    Comment:        NO INDICATION OF MYOCARDIAL INJURY.   Miscellaneous [facility] test (send-out)     Status: None   Collection Time: 12/11/15  2:19 AM  Result Value Ref Range   [facility] test code 4051    [facility] test name CORTISOL    Source ([facility]) 0.8ML SERUM REFRIG    Misc [facility] result COMMENT     Comment: (NOTE) Test Ordered: 998338 Cortisol Cortisol                       1.5              ug/dL    BN                           Cortisol AM         6.2 - 19.4  Cortisol PM         2.3 - 11.9 Performed At: [facility] [place], [place] [id number] Lindon Romp MD [id number] Performed at [facility]   TSH     Status: None   Collection Time: 12/11/15  2:23 AM  Result Value Ref Range   TSH 2.721 0.350 - 4.500 uIU/mL  Basic metabolic panel     Status: Abnormal   Collection Time: 12/11/15  2:23 AM  Result Value Ref Range   Sodium 137 135 - 145 mmol/L   Potassium 3.2 (L) 3.5 - 5.1 mmol/L   Chloride 105 101 - 111 mmol/L   CO2 25 22 - 32 mmol/L   Glucose, Bld 111 (H) 65 - 99 mg/dL   BUN 10 6 - 20 mg/dL   Creatinine, Ser 0.79 0.44 - 1.00 mg/dL   Calcium 9.0 8.9 - 10.3 mg/dL   GFR calc non Af Amer >60 >60 mL/min   GFR calc Af Amer >60 >60 mL/min    Comment: (NOTE) The eGFR has been calculated using the CKD EPI equation. This calculation has not been validated in all clinical situations. eGFR's persistently <60 mL/min signify possible Chronic Kidney Disease.    Anion gap 7 5 - 15  CBC     Status: Abnormal   Collection Time: 12/11/15  2:23 AM  Result Value Ref Range   WBC 8.0 3.6 - 11.0 K/uL   RBC 4.22 3.80 - 5.20 MIL/uL   Hemoglobin 11.9 (L) 12.0 - 16.0 g/dL   HCT 35.4 35.0 - 47.0 %   MCV 84.0 80.0 - 100.0 fL   MCH 28.2 26.0 - 34.0 pg   MCHC 33.6 32.0 - 36.0 g/dL   RDW 14.5 11.5 - 14.5 %   Platelets 276 150 - 440 K/uL  TSH     Status: None   Collection Time: 12/12/15  5:59 AM  Result Value Ref Range   TSH 0.969 0.350 - 4.500 uIU/mL  Cortisol-am, blood     Status: None   Collection Time: 12/12/15  5:59 AM  Result Value Ref Range   Cortisol - AM 15.1 6.7 - 22.6 ug/dL    Comment: Performed at [facility]  Vitamin B1     Status: None   Collection Time: 12/12/15  1:46 PM  Result Value Ref Range   Vitamin B1 (Thiamine) 117.8 66.5 - 200.0 nmol/L    Comment: (NOTE) Performed At: [facility] [address]  [place], [place] [id number] Lindon Romp MD [id number]   Homocysteine     Status: None   Collection Time: 12/12/15  1:46 PM  Result Value Ref Range   Homocysteine 13.4 0.0 - 15.0 umol/L    Comment: (NOTE) Performed At: [facility] [address] [place], [place] [id number] Lindon Romp MD [id number]   Protime-INR     Status: None   Collection Time: 12/12/15  1:46 PM  Result Value Ref Range   Prothrombin Time 14.1 11.4 - 15.0 seconds   INR 1.07   APTT     Status: None   Collection Time: 12/12/15  1:46 PM  Result Value Ref Range   aPTT 30 24 - 36 seconds  Sedimentation rate     Status: Abnormal   Collection Time: 12/12/15  1:46 PM  Result Value Ref Range   Sed Rate 41 (H) 0 - 20 mm/hr  C-reactive protein     Status: None   Collection Time: 12/12/15  1:46 PM  Result Value Ref Range   CRP 0.8 <1.0 mg/dL    Comment: Performed at [facility]     Status: None   Collection Time: 12/12/15  1:46 PM  Result Value Ref Range   B burgdorferi Ab IgG+IgM <0.91 0.00 - 0.90 ISR    Comment: (NOTE)                                Negative         <0.91                                Equivocal  0.91 - 1.09                                Positive         >1.09 Performed At: [facility] [place], [place] [id number] Lindon Romp MD [id number]   Vitamin B12     Status: None   Collection Time: 12/12/15  1:46 PM  Result Value Ref Range   Vitamin B-12 312 180 - 914 pg/mL    Comment: (NOTE) This assay is not validated for testing neonatal or myeloproliferative syndrome specimens for Vitamin B12 levels. Performed at [facility] metabolic panel     Status: Abnormal   Collection Time: 12/13/15  4:09 AM  Result Value Ref Range   Sodium 137 135 - 145 mmol/L   Potassium 3.7 3.5 - 5.1 mmol/L   Chloride 106 101 - 111 mmol/L   CO2 26 22 - 32 mmol/L   Glucose, Bld 100 (H) 65 - 99 mg/dL   BUN 12 6 -  20 mg/dL   Creatinine, Ser 0.90 0.44 - 1.00 mg/dL   Calcium 9.0 8.9 - 10.3 mg/dL   GFR calc non Af Amer >60 >60 mL/min   GFR calc Af Amer >60 >60 mL/min    Comment: (NOTE) The eGFR has been calculated using the CKD EPI equation. This calculation has not been validated in all clinical situations. eGFR's persistently <60 mL/min signify possible Chronic Kidney Disease.    Anion gap 5 5 - 15  Protein and glucose, CSF     Status: None   Collection Time: 12/13/15 10:00 AM  Result Value Ref Range   Glucose, CSF 60 40 - 70 mg/dL   Total  Protein, CSF 36 15 - 45 mg/dL  Oligoclonal bands, CSF + serm     Status: None   Collection Time: 12/13/15 10:00 AM  Result Value Ref Range   CSF Oligoclonal Bands Comment     Comment: (NOTE) Zero (0) oligoclonal bands were observed in the CSF. Interpretation: Criteria for Positivity: Four (4) or more oligoclonal bands observed  only in the CSF have been shown to be most consistent with MS  using our method. [Fortini AS, Sanders EL, Weinshenker BG, and Katzmann JA: Cerebrospinal Fluid Oligoclonal Bands in the Diagnosis  of Multiple Sclerosis. Am J Clin Pathol 120(5):672-675, 2003]. Oligoclonal bands that are present only in the CSF have been associated with a variety of inflammatory brain diseases such as multiple sclerosis (MS), subacute encephalitis, neurosyphilis, etc.  Increased IgG in the CSF is not specific for MS, but is an indication  of chronic neural inflammation. Clinical correlation indicated. Approximately 2-3%  of clinically confirmed MS patients show little or no evidence of oligoclonal bands in the CSF; however oligoclonal  bands may develop as the disease progresses. Oligoclonal Banding testing performed using Isoelectric Focusing ( IEF) and immunoblotting methodology. Performed At: [facility] [place], [place] [id number] Lindon Romp MD [id number]   CSF IgG index     Status: None   Collection Time:  12/13/15 10:00 AM  Result Value Ref Range   IgG, CSF 2.7 0.0 - 8.6 mg/dL    Comment: (NOTE) Performed At: [facility] [address] [place], [place] [id number] Lindon Romp MD [id number]   Myelin basic protein, CSF     Status: None   Collection Time: 12/13/15 10:00 AM  Result Value Ref Range   Myelin Basic Protein 0.8 0.0 - 1.2 ng/mL    Comment: (NOTE) Results for this test are for research purposes only by the assay's manufacturer.  The performance characteristics of this product have not been established.  Results should not be used as a diagnostic procedure without confirmation of the diagnosis by another medically established diagnostic product or procedure. Performed At: [facility] [place], [place] [id number] Lindon Romp MD [id number]   B. burgdorfi antibodies, CSF     Status: None   Collection Time: 12/13/15 10:00 AM  Result Value Ref Range   Lyme Ab Comment     Comment: (NOTE) The IgG value was not calculated because B. burgdorferi IgG was normal. REFERENCE RANGE: 0.0 - 0.9 Index    B. Burgdorferi IgM Ab Index Comment     Comment: (NOTE) The IgM value was not calculated because B. burgdorferi IgM was normal. Unable to calculate, IgM CSF value is below limit of quantification. The calculations of B. burgdorferi IgG and IgM Antibodies Index Ratio are intended to assist in the diagnosis of neuroborreliosis.  The diagnosis of neuroborreliosis is primarily a clinical one. Laboratory data should only be used as a supplement to clinical impression. These calculations are meaningful only in cases where the B. burgdorferi IgG or IgM in CSF is elevated. The reference ranges for the calculated indexes have bee adopted from published ranges (refer to technical brief). REFERENCE RANGE: 0.0 - 0.9 Index    B. Burgdorferi IgG Serum <0.20 0.00 - 0.80 Index    Comment: (NOTE) Result Interpretation: Negative:     <  0.80 Equivocal:     > or = 0.81 and < 1.20 Positive:     > or = 1.20    B. burgdorferi IgG CSF <0.08 0.00 - 0.09 Index    Comment: (NOTE) Result Interpretation: Negative:      < or = 0.09 Positive:     > 0.09    B. Burgdorferi IgM Serum 0.27 0.00 - 0.80 Index    Comment: (NOTE) Result Interpretation: Negative:     < 0.80 Equivocal:     > or = 0.81 and < 1.20 Positive:     > or = 1.20    B. burgdorferi IgM CSF <0.06 0.00 - 0.06 Index    Comment: (NOTE) Result Interpretation: Negative:     < or = 0.06 Positive:     > 0.06    IgG Total Serum 928 540 - 1423 mg/dl   IgM Total Serum 96 52 - 217 mg/dl   Albumin Serum 3900 3700 - 5100 mg/dl   IgG Total CSF 2.80 0.00 - 3.06 mg/dl   IgM Total CSF <1.6 0.0 - 1.6 mg/dL  Comment: NOTE: New reference range effective September 30, 2015.   Albumin CSF 18.6 10.4 - 43.2 mg/dL   Albumin Index Ratio 4.8 < 9.0 Index   IgG Index 0.63     Comment: REFERENCE RANGE: 0.00   0.69 Index    IgM Index Comment     Comment: (NOTE) unable to calculate, IgM CSF value is below limit of quantitation REFERENCE RANGE: 0.00  - 0.09 Index    IgG (loc) Comment     Comment: (NOTE) unable to calculate, IgG CSF value is below limit of quantitation REFERENCE RANGE: 0.000 - 0.099 mg/dL    IgM (loc) Comment     Comment: (NOTE) unable to calculate, IgM CSF value is below limit of quantitation REFERENCE RANGE: 0.000 -0.009 mg/dL    CNS IgG Synthesis Rate 0.6     Comment: (NOTE) The performance characteristics of the listed CSF Assays were validated by [employer] The [place] FDA has not approved or cleared these tests. The results of these assays can be used for clinical diagnosis without FDA approval. [employer] is a [employer] representative, CAP accredited laboratory for performing high complexity assays such as these. REFERENCE RANGE: 0.0 - 3.2 mg/24hr Performed At: [facility] [place], [place]  [id number] Tinghitella Gwen Her PhD [id number]   CSF cell count with differential collection tube #: 1     Status: Abnormal   Collection Time: 12/13/15 10:00 AM  Result Value Ref Range   Tube # 1    Color, CSF COLORLESS COLORLESS   Appearance, CSF CLEAR (A) CLEAR   RBC Count, CSF 166 (H) 0 - 3 /cu mm   WBC, CSF 17 /cu mm   Segmented Neutrophils-CSF 0 %   Lymphs, CSF 91 %   Monocyte-Macrophage-Spinal Fluid 9 %   Eosinophils, CSF 0 %  CSF culture     Status: None   Collection Time: 12/13/15 10:00 AM  Result Value Ref Range   Specimen Description CSF    Special Requests Normal    Gram Stain      FEW RED BLOOD CELLS RARE WBC SEEN NO ORGANISMS SEEN    Culture NO GROWTH 3 DAYS    Report Status 12/16/2015 FINAL   VDRL, CSF     Status: None   Collection Time: 12/13/15 10:00 AM  Result Value Ref Range   VDRL Quant, CSF Non Reactive Non Rea:<1:1    Comment: (NOTE) Performed At: [facility] [place], [place] [id number] Lindon Romp MD [id number]   Potassium     Status: None   Collection Time: 12/28/15  3:41 PM  Result Value Ref Range   Potassium 3.7 3.5 - 5.2 mmol/L    PHQ2/9: Depression screen [facility] 2/9 12/05/2015 04/20/2015  Decreased Interest 0 0  Down, Depressed, Hopeless 0 0  PHQ - 2 Score 0 0    Fall Risk: Fall Risk  12/05/2015 04/20/2015 03/10/2015 01/18/2015  Falls in the past year? No No No No     Assessment & Plan  1. Essential hypertension  Continue medication and monitor bp at home  2. Abnormal brain MRI  Microvascular disease  3. Vestibular migraine  Advised to try taking Topamax earlier in the pm, so she is not so sleepy when she first gets up in am. Seems to be helping, but still has symptoms when active, or out of the house for more than 2 hours. I will extend time off work until July 19th, Resume work  on a Wednesday likely as part time and progress to full time as tolerated. That way she will have time to get adjusted to  new medications, have Sleep study done and also have results of EEG.   4. Snoring  Sleep study already scheduled.

## 2016-01-17 ENCOUNTER — Ambulatory Visit: Payer: 59 | Attending: Neurology

## 2016-01-17 DIAGNOSIS — G4733 Obstructive sleep apnea (adult) (pediatric): Secondary | ICD-10-CM | POA: Insufficient documentation

## 2016-01-17 DIAGNOSIS — G4719 Other hypersomnia: Secondary | ICD-10-CM | POA: Diagnosis present

## 2016-01-19 DIAGNOSIS — G4733 Obstructive sleep apnea (adult) (pediatric): Secondary | ICD-10-CM | POA: Diagnosis not present

## 2016-01-20 ENCOUNTER — Ambulatory Visit: Payer: 59 | Admitting: Family Medicine

## 2016-01-23 ENCOUNTER — Telehealth: Payer: Self-pay

## 2016-01-23 NOTE — Telephone Encounter (Signed)
Patient is requesting a letter to be sent to her manager stating that her leave has been extended to February 08, 2016.  Letter was printed and given to her office manager, Judson Roch.

## 2016-01-31 DIAGNOSIS — G4733 Obstructive sleep apnea (adult) (pediatric): Secondary | ICD-10-CM | POA: Diagnosis not present

## 2016-02-01 ENCOUNTER — Other Ambulatory Visit: Payer: Self-pay | Admitting: Family Medicine

## 2016-02-01 DIAGNOSIS — I10 Essential (primary) hypertension: Secondary | ICD-10-CM

## 2016-02-01 MED ORDER — HYDROCHLOROTHIAZIDE 12.5 MG PO CAPS: 12.5000 mg | ORAL_CAPSULE | Freq: Every day | ORAL | Status: DC

## 2016-02-01 NOTE — Telephone Encounter (Signed)
Refill request was sent to Dr. Krichna Sowles for approval and submission.  

## 2016-02-01 NOTE — Telephone Encounter (Signed)
Requesting refill on Hydrochlorothiazide, took last pill today. Have appointment for the 19th.

## 2016-02-03 ENCOUNTER — Encounter: Payer: Self-pay | Admitting: Family Medicine

## 2016-02-03 ENCOUNTER — Ambulatory Visit (INDEPENDENT_AMBULATORY_CARE_PROVIDER_SITE_OTHER): Payer: 59 | Admitting: Family Medicine

## 2016-02-03 VITALS — BP 122/78 | HR 87 | Temp 97.3°F | Resp 16 | Ht 62.0 in | Wt 218.6 lb

## 2016-02-03 DIAGNOSIS — G4733 Obstructive sleep apnea (adult) (pediatric): Secondary | ICD-10-CM

## 2016-02-03 DIAGNOSIS — Z9989 Dependence on other enabling machines and devices: Principal | ICD-10-CM

## 2016-02-03 DIAGNOSIS — I10 Essential (primary) hypertension: Secondary | ICD-10-CM | POA: Diagnosis not present

## 2016-02-03 DIAGNOSIS — G43109 Migraine with aura, not intractable, without status migrainosus: Secondary | ICD-10-CM | POA: Diagnosis not present

## 2016-02-03 DIAGNOSIS — R0789 Other chest pain: Secondary | ICD-10-CM | POA: Diagnosis not present

## 2016-02-03 DIAGNOSIS — G43809 Other migraine, not intractable, without status migrainosus: Secondary | ICD-10-CM

## 2016-02-03 NOTE — Progress Notes (Addendum)
Name: Marie Vaughan   MRN: [id number]    DOB: [birth year]   Date:02/03/2016       Progress Note  Subjective  Chief Complaint  Chief Complaint  Patient presents with  . Follow-up  . Hypertension  . Migraine  . Snoring  . Abnormal brain MRI    HPI  HTN: down to HCTZ once daily, bp is good, still has some intermittent chest pain - seen by Dr. Fletcher Anon, negative work up and he thinks it may be muscular in nature.   Migraine/vestibular: she has follow up with Dr. Manuella Ghazi on Monday. She is doing well on Topamax 50 mg daily, but states no headaches for the past two days, symptoms resolved when she started to wear CPAP machine. Advised her to only take Meclizine prn instead of daily   OSA: moderate to severe based on sleep study, started on CPAP at 9 cm H2O two days ago, no longer snoring, feeling alert during the day, fatigue and headaches resolved. No problems tolerating mask.    Patient Active Problem List   Diagnosis Date Noted  . OSA on CPAP 02/03/2016  . 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 Abnormal brain MRI 12/20/2015  . Dizziness 12/13/2015  . Near syncope 12/10/2015  . Atypical chest pain 08/22/2013  . Essential hypertension 08/22/2013  . Angio-edema   . GERD (gastroesophageal reflux disease)   . Migraine with aura and without status migrainosus   . Acne   . Anxiety   . Allergic rhinitis 10/05/2011  . Insomnia 08/30/2011  . Obesity 08/30/2011    Past Surgical History  Procedure Laterality Date  . Ovarian cyst removal  04-25-10  . Abdominal hysterectomy  04-25-10    Due to abnormal PAP  . Knee arthroscopy with medial menisectomy Right 06/10/2013    Procedure: RIGHT KNEE ARTHROSCOPY WITH MEDIAL MENISECTOMY;  Surgeon: Lorn Junes, MD;  Location: [facility];  Service: Orthopedics;  Laterality: Right;  partial lateral menisectomoy and chondroplasty  . Knee arthroscopy Left 06/10/2013    Procedure: LEFT KNEE ARTHROSCOPY KNEE WITH PARTIAL MEDIAL MENISCECTOMY ;   Surgeon: Lorn Junes, MD;  Location: [facility];  Service: Orthopedics;  Laterality: Left;  xerofoam, 4x4's, webril, ace wrap, ice wrap  . Dilation and curettage of uterus    . Cardiac catheterization  04/2014    [facility]    Family History  Problem Relation Age of Onset  . Early death Mother     Murdered when pt was 55 years old  . Diabetes Mother   . Hypertension Mother   . Gout Mother   . Kidney failure Mother   . Early death Sister 25    due to hemorage  . Alcohol abuse Maternal Uncle   . Alcohol abuse Other   . Arthritis Other   . Heart disease Other   . Other Other     cousin- phlebitis  . Diabetes Maternal Aunt   . Hyperlipidemia Maternal Aunt   . Hypertension Maternal Aunt   . Prostate cancer Maternal Uncle     Social History   Social History  . Marital Status: Single    Spouse Name: N/A  . Number of Children: 3  . Years of Education: N/A   Occupational History  . Medical records - [facility]    Social History Main Topics  . Smoking status: Never Smoker   . Smokeless tobacco: Never Used  . Alcohol Use: No  . Drug Use: No  . Sexual  Activity:    Partners: Male   Other Topics Concern  . Not on file   Social History Narrative   Lives in [place] with husband, two children gone - one in college, and older daughter already out of the house. Works at [employer].                 Current outpatient prescriptions:  .  ALPRAZolam (XANAX) 0.5 MG tablet, TAKE 1 TABLET BY MOUTH TWICE DAILY AS NEEDED FOR ANXIETY., Disp: 30 tablet, Rfl: 5 .  aspirin EC 81 MG tablet, Take 1 tablet (81 mg total) by mouth daily., Disp: 90 tablet, Rfl: 3 .  diazepam (VALIUM) 5 MG tablet, Take 1 tablet (5 mg total) by mouth every 12 (twelve) hours as needed for anxiety (HEADACHE)., Disp: 20 tablet, Rfl: 0 .  Elastic Bandages & Supports (MEDICAL COMPRESSION STOCKINGS) MISC, 2 Units by Does not apply route daily., Disp: 2 each, Rfl: 0 .  esomeprazole (NEXIUM) 40 MG  capsule, Take 1 capsule (40 mg total) by mouth daily before breakfast., Disp: , Rfl:  .  hydrochlorothiazide (MICROZIDE) 12.5 MG capsule, Take 1 capsule (12.5 mg total) by mouth daily., Disp: 30 capsule, Rfl: 0 .  meclizine (ANTIVERT) 25 MG tablet, Take 1 tablet (25 mg total) by mouth 3 (three) times daily as needed for dizziness., Disp: 30 tablet, Rfl: 0 .  senna (SENOKOT) 8.6 MG TABS tablet, Take 2 tablets (17.2 mg total) by mouth daily., Disp: 120 each, Rfl: 0 .  topiramate (TOPAMAX) 25 MG capsule, Take 25 mg by mouth 2 (two) times daily., Disp: , Rfl:   Allergies  Allergen Reactions  . Chocolate     Hives, itching  . Iohexol     CP and SOB  . Peanut-Containing Drug Products Swelling    cashews     ROS  Ten systems reviewed and is negative except as mentioned in HPI   Objective  Filed Vitals:   02/03/16 1050  BP: 122/78  Pulse: 87  Temp: 97.3 F (36.3 C)  TempSrc: Oral  Resp: 16  Height: _0  (1.575 m)  Weight: 218 lb 9.6 oz (99.156 kg)  SpO2: 97%    Body mass index is 39.97 kg/(m^2).  Physical Exam  Constitutional: Patient appears well-developed and well-nourished. Obese No distress.  HEENT: head atraumatic, normocephalic, pupils equal and reactive to light,  neck supple, throat within normal limits Cardiovascular: Normal rate, regular rhythm and normal heart sounds.  No murmur heard. No BLE edema. Pulmonary/Chest: Effort normal and breath sounds normal. No respiratory distress. Abdominal: Soft.  There is no tenderness. Psychiatric: Patient has a normal mood and affect. behavior is normal. Judgment and thought content normal.  Recent Results (from the past 2160 hour(s))  POCT Influenza A/B (POC66)     Status: Normal   Collection Time: 11/14/15  2:43 PM  Result Value Ref Range   Influenza A, POC Negative Negative   Influenza B, POC Negative Negative  CBC with Differential/Platelet     Status: Abnormal   Collection Time: 12/07/15  8:44 AM  Result Value Ref  Range   WBC 6.5 3.4 - 10.8 x10E3/uL   RBC 3.99 3.77 - 5.28 x10E6/uL   Hemoglobin 11.2 11.1 - 15.9 g/dL   Hematocrit 33.9 (L) 34.0 - 46.6 %   MCV 85 79 - 97 fL   MCH 28.1 26.6 - 33.0 pg   MCHC 33.0 31.5 - 35.7 g/dL   RDW 13.9 12.3 - 15.4 %   Platelets 299  150 - 379 x10E3/uL   Neutrophils 61 %   Lymphs 31 %   Monocytes 6 %   Eos 1 %   Basos 0 %   Neutrophils Absolute 4.0 1.4 - 7.0 x10E3/uL   Lymphocytes Absolute 2.0 0.7 - 3.1 x10E3/uL   Monocytes Absolute 0.4 0.1 - 0.9 x10E3/uL   EOS (ABSOLUTE) 0.1 0.0 - 0.4 x10E3/uL   Basophils Absolute 0.0 0.0 - 0.2 x10E3/uL   Immature Granulocytes 1 %   Immature Grans (Abs) 0.0 0.0 - 0.1 x10E3/uL  Comprehensive metabolic panel     Status: None   Collection Time: 12/07/15  8:44 AM  Result Value Ref Range   Glucose 93 65 - 99 mg/dL   BUN 10 6 - 24 mg/dL   Creatinine, Ser 0.77 0.57 - 1.00 mg/dL   GFR calc non Af Amer 93 >59 mL/min/1.73   GFR calc Af Amer 107 >59 mL/min/1.73   BUN/Creatinine Ratio 13 9 - 23   Sodium 141 134 - 144 mmol/L   Potassium 4.0 3.5 - 5.2 mmol/L   Chloride 103 96 - 106 mmol/L   CO2 23 18 - 29 mmol/L   Calcium 9.2 8.7 - 10.2 mg/dL   Total Protein 6.3 6.0 - 8.5 g/dL   Albumin 3.9 3.5 - 5.5 g/dL   Globulin, Total 2.4 1.5 - 4.5 g/dL   Albumin/Globulin Ratio 1.6 1.2 - 2.2   Bilirubin Total <0.2 0.0 - 1.2 mg/dL   Alkaline Phosphatase 57 39 - 117 IU/L   AST 14 0 - 40 IU/L   ALT 14 0 - 32 IU/L  TSH     Status: None   Collection Time: 12/07/15  8:44 AM  Result Value Ref Range   TSH 1.310 0.450 - 4.500 uIU/mL  Basic metabolic panel     Status: Abnormal   Collection Time: 12/10/15 11:35 AM  Result Value Ref Range   Sodium 136 135 - 145 mmol/L   Potassium 3.3 (L) 3.5 - 5.1 mmol/L   Chloride 103 101 - 111 mmol/L   CO2 25 22 - 32 mmol/L   Glucose, Bld 89 65 - 99 mg/dL   BUN 12 6 - 20 mg/dL   Creatinine, Ser 0.76 0.44 - 1.00 mg/dL   Calcium 9.9 8.9 - 10.3 mg/dL   GFR calc non Af Amer >60 >60 mL/min   GFR calc Af Amer  >60 >60 mL/min    Comment: (NOTE) The eGFR has been calculated using the CKD EPI equation. This calculation has not been validated in all clinical situations. eGFR's persistently <60 mL/min signify possible Chronic Kidney Disease.    Anion gap 8 5 - 15  CBC     Status: None   Collection Time: 12/10/15 11:35 AM  Result Value Ref Range   WBC 8.1 3.6 - 11.0 K/uL   RBC 4.51 3.80 - 5.20 MIL/uL   Hemoglobin 12.7 12.0 - 16.0 g/dL   HCT 37.7 35.0 - 47.0 %   MCV 83.6 80.0 - 100.0 fL   MCH 28.1 26.0 - 34.0 pg   MCHC 33.6 32.0 - 36.0 g/dL   RDW 14.2 11.5 - 14.5 %   Platelets 301 150 - 440 K/uL  Urinalysis complete, with microscopic     Status: Abnormal   Collection Time: 12/10/15 11:35 AM  Result Value Ref Range   Color, Urine YELLOW (A) YELLOW   APPearance CLEAR (A) CLEAR   Glucose, UA NEGATIVE NEGATIVE mg/dL   Bilirubin Urine NEGATIVE NEGATIVE   Ketones, ur NEGATIVE  NEGATIVE mg/dL   Specific Gravity, Urine 1.015 1.005 - 1.030   Hgb urine dipstick NEGATIVE NEGATIVE   pH 6.0 5.0 - 8.0   Protein, ur NEGATIVE NEGATIVE mg/dL   Nitrite NEGATIVE NEGATIVE   Leukocytes, UA NEGATIVE NEGATIVE   RBC / HPF NONE SEEN 0 - 5 RBC/hpf   WBC, UA 0-5 0 - 5 WBC/hpf   Bacteria, UA NONE SEEN NONE SEEN   Squamous Epithelial / LPF 0-5 (A) NONE SEEN   Mucous PRESENT   Brain natriuretic peptide     Status: None   Collection Time: 12/10/15 11:35 AM  Result Value Ref Range   B Natriuretic Peptide 6.0 0.0 - 100.0 pg/mL  Pregnancy, urine     Status: None   Collection Time: 12/10/15 11:35 AM  Result Value Ref Range   Preg Test, Ur NEGATIVE NEGATIVE  Fibrin derivatives D-Dimer     Status: None   Collection Time: 12/10/15 11:35 AM  Result Value Ref Range   Fibrin derivatives D-dimer ([facility]) 431 0 - 499    Comment: <> Exclusion of Venous Thromboembolism (VTE) - OUTPATIENTS ONLY        (Emergency Department or [place])             0-499 ng/ml (FEU)  : With a low to intermediate pretest                                         probability for VTE this test result                                        excludes the diagnosis of VTE.           > 499 ng/ml (FEU)  : VTE not excluded.  Additional work up                                   for VTE is required.   <>  Testing on Inpatients and Evaluation of Disseminated Intravascular        Coagulation (DIC)             Reference Range:   0-499 ng/ml (FEU)   Glucose, capillary     Status: Abnormal   Collection Time: 12/10/15  1:00 PM  Result Value Ref Range   Glucose-Capillary 61 (L) 65 - 99 mg/dL  Troponin I     Status: None   Collection Time: 12/10/15  2:17 PM  Result Value Ref Range   Troponin I <0.03 <0.031 ng/mL    Comment:        NO INDICATION OF MYOCARDIAL INJURY.   Lactic acid, plasma     Status: None   Collection Time: 12/10/15  2:17 PM  Result Value Ref Range   Lactic Acid, Venous 1.2 0.5 - 2.0 mmol/L  Hepatic function panel     Status: Abnormal   Collection Time: 12/10/15  2:17 PM  Result Value Ref Range   Total Protein 8.0 6.5 - 8.1 g/dL   Albumin 4.3 3.5 - 5.0 g/dL   AST 21 15 - 41 U/L   ALT 21 14 - 54 U/L   Alkaline Phosphatase 51 38 - 126 U/L   Total Bilirubin 0.5 0.3 - 1.2  mg/dL   Bilirubin, Direct <0.1 (L) 0.1 - 0.5 mg/dL   Indirect Bilirubin NOT CALCULATED 0.3 - 0.9 mg/dL  ECHO COMPLETE     Status: None   Collection Time: 12/10/15  3:55 PM  Result Value Ref Range   Weight 3376 oz   Height 62 in   BP 118/79 mmHg  Lactic acid, plasma     Status: None   Collection Time: 12/10/15  4:35 PM  Result Value Ref Range   Lactic Acid, Venous 0.9 0.5 - 2.0 mmol/L  Troponin I     Status: None   Collection Time: 12/10/15  7:53 PM  Result Value Ref Range   Troponin I <0.03 <0.031 ng/mL    Comment:        NO INDICATION OF MYOCARDIAL INJURY.   Troponin I     Status: None   Collection Time: 12/11/15  2:19 AM  Result Value Ref Range   Troponin I <0.03 <0.031 ng/mL    Comment:        NO INDICATION OF MYOCARDIAL INJURY.    Miscellaneous [facility] test (send-out)     Status: None   Collection Time: 12/11/15  2:19 AM  Result Value Ref Range   [facility] test code 4051    [facility] test name CORTISOL    Source ([facility]) 0.8ML SERUM REFRIG    Misc [facility] result COMMENT     Comment: (NOTE) Test Ordered: [id number] Cortisol Cortisol                       1.5              ug/dL    BN                          Cortisol AM         6.2 - 19.4                        Cortisol PM         2.3 - 11.9 Performed At: [facility] [place], [place] [id number] Lindon Romp MD [id number] Performed at [facility]   TSH     Status: None   Collection Time: 12/11/15  2:23 AM  Result Value Ref Range   TSH 2.721 0.350 - 4.500 uIU/mL  Basic metabolic panel     Status: Abnormal   Collection Time: 12/11/15  2:23 AM  Result Value Ref Range   Sodium 137 135 - 145 mmol/L   Potassium 3.2 (L) 3.5 - 5.1 mmol/L   Chloride 105 101 - 111 mmol/L   CO2 25 22 - 32 mmol/L   Glucose, Bld 111 (H) 65 - 99 mg/dL   BUN 10 6 - 20 mg/dL   Creatinine, Ser 0.79 0.44 - 1.00 mg/dL   Calcium 9.0 8.9 - 10.3 mg/dL   GFR calc non Af Amer >60 >60 mL/min   GFR calc Af Amer >60 >60 mL/min    Comment: (NOTE) The eGFR has been calculated using the CKD EPI equation. This calculation has not been validated in all clinical situations. eGFR's persistently <60 mL/min signify possible Chronic Kidney Disease.    Anion gap 7 5 - 15  CBC     Status: Abnormal   Collection Time: 12/11/15  2:23 AM  Result Value Ref Range   WBC 8.0 3.6 - 11.0 K/uL   RBC 4.22 3.80 -  5.20 MIL/uL   Hemoglobin 11.9 (L) 12.0 - 16.0 g/dL   HCT 35.4 35.0 - 47.0 %   MCV 84.0 80.0 - 100.0 fL   MCH 28.2 26.0 - 34.0 pg   MCHC 33.6 32.0 - 36.0 g/dL   RDW 14.5 11.5 - 14.5 %   Platelets 276 150 - 440 K/uL  TSH     Status: None   Collection Time: 12/12/15  5:59 AM  Result Value Ref Range   TSH 0.969 0.350 - 4.500 uIU/mL  Cortisol-am, blood     Status: None    Collection Time: 12/12/15  5:59 AM  Result Value Ref Range   Cortisol - AM 15.1 6.7 - 22.6 ug/dL    Comment: Performed at [facility]  Vitamin B1     Status: None   Collection Time: 12/12/15  1:46 PM  Result Value Ref Range   Vitamin B1 (Thiamine) 117.8 66.5 - 200.0 nmol/L    Comment: (NOTE) Performed At: [facility] [place], [place] [id number] Lindon Romp MD [id number]   Homocysteine     Status: None   Collection Time: 12/12/15  1:46 PM  Result Value Ref Range   Homocysteine 13.4 0.0 - 15.0 umol/L    Comment: (NOTE) Performed At: [facility] [address] [place], [place] [id number] Lindon Romp MD JH:[id number]   Protime-INR     Status: None   Collection Time: 12/12/15  1:46 PM  Result Value Ref Range   Prothrombin Time 14.1 11.4 - 15.0 seconds   INR 1.07   APTT     Status: None   Collection Time: 12/12/15  1:46 PM  Result Value Ref Range   aPTT 30 24 - 36 seconds  Sedimentation rate     Status: Abnormal   Collection Time: 12/12/15  1:46 PM  Result Value Ref Range   Sed Rate 41 (H) 0 - 20 mm/hr  C-reactive protein     Status: None   Collection Time: 12/12/15  1:46 PM  Result Value Ref Range   CRP 0.8 <1.0 mg/dL    Comment: Performed at [facility]  B. burgdorfi antibodies     Status: None   Collection Time: 12/12/15  1:46 PM  Result Value Ref Range   B burgdorferi Ab IgG+IgM <0.91 0.00 - 0.90 ISR    Comment: (NOTE)                                Negative         <0.91                                Equivocal  0.91 - 1.09                                Positive         >1.09 Performed At: [facility] [place], [place] [id number] Lindon Romp MD WY:[id number]   Vitamin B12     Status: None   Collection Time: 12/12/15  1:46 PM  Result Value Ref Range   Vitamin B-12 312 180 - 914 pg/mL    Comment: (NOTE) This assay is not validated for testing neonatal  or myeloproliferative syndrome specimens for Vitamin B12 levels. Performed at [facility]  Basic metabolic panel     Status: Abnormal   Collection Time: 12/13/15  4:09 AM  Result Value Ref Range   Sodium 137 135 - 145 mmol/L   Potassium 3.7 3.5 - 5.1 mmol/L   Chloride 106 101 - 111 mmol/L   CO2 26 22 - 32 mmol/L   Glucose, Bld 100 (H) 65 - 99 mg/dL   BUN 12 6 - 20 mg/dL   Creatinine, Ser 0.90 0.44 - 1.00 mg/dL   Calcium 9.0 8.9 - 10.3 mg/dL   GFR calc non Af Amer >60 >60 mL/min   GFR calc Af Amer >60 >60 mL/min    Comment: (NOTE) The eGFR has been calculated using the CKD EPI equation. This calculation has not been validated in all clinical situations. eGFR's persistently <60 mL/min signify possible Chronic Kidney Disease.    Anion gap 5 5 - 15  Protein and glucose, CSF     Status: None   Collection Time: 12/13/15 10:00 AM  Result Value Ref Range   Glucose, CSF 60 40 - 70 mg/dL   Total  Protein, CSF 36 15 - 45 mg/dL  Oligoclonal bands, CSF + serm     Status: None   Collection Time: 12/13/15 10:00 AM  Result Value Ref Range   CSF Oligoclonal Bands Comment     Comment: (NOTE) Zero (0) oligoclonal bands were observed in the CSF. Interpretation: Criteria for Positivity: Four (4) or more oligoclonal bands observed  only in the CSF have been shown to be most consistent with MS  using our method. [Fortini AS, Sanders EL, Weinshenker BG, and Katzmann JA: Cerebrospinal Fluid Oligoclonal Bands in the Diagnosis  of Multiple Sclerosis. Am J Clin Pathol 120(5):672-675, 2003]. Oligoclonal bands that are present only in the CSF have been associated with a variety of inflammatory brain diseases such as multiple sclerosis (MS), subacute encephalitis, neurosyphilis, etc.  Increased IgG in the CSF is not specific for MS, but is an indication  of chronic neural inflammation. Clinical correlation indicated. Approximately 2-3% of clinically confirmed MS patients show little or no  evidence of oligoclonal bands in the CSF; however oligoclonal  bands may develop as the disease progresses. Oligoclonal Banding testing performed using Isoelectric Focusing ( IEF) and immunoblotting methodology. Performed At: [facility] [place], [place] [id number] Lindon Romp MD XH:[id number]   CSF IgG index     Status: None   Collection Time: 12/13/15 10:00 AM  Result Value Ref Range   IgG, CSF 2.7 0.0 - 8.6 mg/dL    Comment: (NOTE) Performed At: [facility] [address] [place], [place] [id number] Lindon Romp MD SH:[id number]   Myelin basic protein, CSF     Status: None   Collection Time: 12/13/15 10:00 AM  Result Value Ref Range   Myelin Basic Protein 0.8 0.0 - 1.2 ng/mL    Comment: (NOTE) Results for this test are for research purposes only by the assay's manufacturer.  The performance characteristics of this product have not been established.  Results should not be used as a diagnostic procedure without confirmation of the diagnosis by another medically established diagnostic product or procedure. Performed At: [facility] [address] [place], [place] [id number] Lindon Romp MD VV:[id number]   B. burgdorfi antibodies, CSF     Status: None   Collection Time: 12/13/15 10:00 AM  Result Value Ref Range   Lyme Ab Comment     Comment: (NOTE) The IgG value was not calculated because B.  burgdorferi IgG was normal. REFERENCE RANGE: 0.0 - 0.9 Index    B. Burgdorferi IgM Ab Index Comment     Comment: (NOTE) The IgM value was not calculated because B. burgdorferi IgM was normal. Unable to calculate, IgM CSF value is below limit of quantification. The calculations of B. burgdorferi IgG and IgM Antibodies Index Ratio are intended to assist in the diagnosis of neuroborreliosis.  The diagnosis of neuroborreliosis is primarily a clinical one. Laboratory data should only be used as a supplement to clinical  impression. These calculations are meaningful only in cases where the B. burgdorferi IgG or IgM in CSF is elevated. The reference ranges for the calculated indexes have bee adopted from published ranges (refer to technical brief). REFERENCE RANGE: 0.0 - 0.9 Index    B. Burgdorferi IgG Serum <0.20 0.00 - 0.80 Index    Comment: (NOTE) Result Interpretation: Negative:     < 0.80 Equivocal:     > or = 0.81 and < 1.20 Positive:     > or = 1.20    B. burgdorferi IgG CSF <0.08 0.00 - 0.09 Index    Comment: (NOTE) Result Interpretation: Negative:      < or = 0.09 Positive:     > 0.09    B. Burgdorferi IgM Serum 0.27 0.00 - 0.80 Index    Comment: (NOTE) Result Interpretation: Negative:     < 0.80 Equivocal:     > or = 0.81 and < 1.20 Positive:     > or = 1.20    B. burgdorferi IgM CSF <0.06 0.00 - 0.06 Index    Comment: (NOTE) Result Interpretation: Negative:     < or = 0.06 Positive:     > 0.06    IgG Total Serum 928 540 - 1423 mg/dl   IgM Total Serum 96 52 - 217 mg/dl   Albumin Serum 3900 3700 - 5100 mg/dl   IgG Total CSF 2.80 0.00 - 3.06 mg/dl   IgM Total CSF <1.6 0.0 - 1.6 mg/dL    Comment: NOTE: New reference range effective September 30, 2015.   Albumin CSF 18.6 10.4 - 43.2 mg/dL   Albumin Index Ratio 4.8 < 9.0 Index   IgG Index 0.63     Comment: REFERENCE RANGE: 0.00   0.69 Index    IgM Index Comment     Comment: (NOTE) unable to calculate, IgM CSF value is below limit of quantitation REFERENCE RANGE: 0.00  - 0.09 Index    IgG (loc) Comment     Comment: (NOTE) unable to calculate, IgG CSF value is below limit of quantitation REFERENCE RANGE: 0.000 - 0.099 mg/dL    IgM (loc) Comment     Comment: (NOTE) unable to calculate, IgM CSF value is below limit of quantitation REFERENCE RANGE: 0.000 -0.009 mg/dL    CNS IgG Synthesis Rate 0.6     Comment: (NOTE) The performance characteristics of the listed CSF Assays were validated by [employer] The [place]  FDA has not approved or cleared these tests. The results of these assays can be used for clinical diagnosis without FDA approval. [employer] is a Holiday representative, CAP accredited laboratory for performing high complexity assays such as these. REFERENCE RANGE: 0.0 - 3.2 mg/24hr Performed At: [facility] [place], [place] [id number] Tinghitella Gwen Her PhD [id number]   CSF cell count with differential collection tube #: 1     Status: Abnormal   Collection Time: 12/13/15 10:00 AM  Result Value Ref Range   Tube # 1    Color, CSF COLORLESS COLORLESS   Appearance, CSF CLEAR (A) CLEAR   RBC Count, CSF 166 (H) 0 - 3 /cu mm   WBC, CSF 17 /cu mm   Segmented Neutrophils-CSF 0 %   Lymphs, CSF 91 %   Monocyte-Macrophage-Spinal Fluid 9 %   Eosinophils, CSF 0 %  CSF culture     Status: None   Collection Time: 12/13/15 10:00 AM  Result Value Ref Range   Specimen Description CSF    Special Requests Normal    Gram Stain      FEW RED BLOOD CELLS RARE WBC SEEN NO ORGANISMS SEEN    Culture NO GROWTH 3 DAYS    Report Status 12/16/2015 FINAL   VDRL, CSF     Status: None   Collection Time: 12/13/15 10:00 AM  Result Value Ref Range   VDRL Quant, CSF Non Reactive Non Rea:<1:1    Comment: (NOTE) Performed At: [facility] [place], [place] [id number] Lindon Romp MD [id number]   Potassium     Status: None   Collection Time: 12/28/15  3:41 PM  Result Value Ref Range   Potassium 3.7 3.5 - 5.2 mmol/L     PHQ2/9: Depression screen [facility] 2/9 12/05/2015 04/20/2015  Decreased Interest 0 0  Down, Depressed, Hopeless 0 0  PHQ - 2 Score 0 0    Fall Risk: Fall Risk  12/05/2015 04/20/2015 03/10/2015 01/18/2015  Falls in the past year? No No No No    Assessment & Plan  1. OSA on CPAP  Doing well, continue follow up with Dr. Manuella Ghazi.   2. Essential hypertension  Continue medication   3. Atypical chest pain  Doing well  now, stable.   4. Vestibular migraine  Improved with CPAP machine and Topamax We will let her return to work as previous discussed since she still has a follow up with neurologist and cardiologist

## 2016-02-03 NOTE — Addendum Note (Signed)
Addended by: Steele Sizer F on: 02/03/2016 12:25 PM   Modules accepted: Miquel Dunn

## 2016-02-03 NOTE — Addendum Note (Signed)
Addended by: Johnnette Litter A on: 02/03/2016 11:22 AM   Modules accepted: Miquel Dunn

## 2016-02-06 DIAGNOSIS — G43109 Migraine with aura, not intractable, without status migrainosus: Secondary | ICD-10-CM | POA: Diagnosis not present

## 2016-02-06 DIAGNOSIS — G4733 Obstructive sleep apnea (adult) (pediatric): Secondary | ICD-10-CM | POA: Diagnosis not present

## 2016-02-06 DIAGNOSIS — I679 Cerebrovascular disease, unspecified: Secondary | ICD-10-CM | POA: Diagnosis not present

## 2016-02-06 DIAGNOSIS — Z9989 Dependence on other enabling machines and devices: Secondary | ICD-10-CM | POA: Diagnosis not present

## 2016-02-08 ENCOUNTER — Ambulatory Visit: Payer: 59 | Admitting: Family Medicine

## 2016-03-02 DIAGNOSIS — G4733 Obstructive sleep apnea (adult) (pediatric): Secondary | ICD-10-CM | POA: Diagnosis not present

## 2016-03-05 ENCOUNTER — Encounter: Payer: Self-pay | Admitting: Family Medicine

## 2016-03-05 ENCOUNTER — Ambulatory Visit (INDEPENDENT_AMBULATORY_CARE_PROVIDER_SITE_OTHER): Payer: 59 | Admitting: Family Medicine

## 2016-03-05 VITALS — BP 122/78 | HR 94 | Temp 98.2°F | Resp 18 | Ht 62.0 in | Wt 217.1 lb

## 2016-03-05 DIAGNOSIS — H6123 Impacted cerumen, bilateral: Secondary | ICD-10-CM

## 2016-03-05 DIAGNOSIS — H938X2 Other specified disorders of left ear: Secondary | ICD-10-CM

## 2016-03-05 DIAGNOSIS — I10 Essential (primary) hypertension: Secondary | ICD-10-CM | POA: Diagnosis not present

## 2016-03-05 MED ORDER — HYDROCHLOROTHIAZIDE 12.5 MG PO CAPS: 12.5000 mg | ORAL_CAPSULE | Freq: Every day | ORAL | 2 refills | Status: DC

## 2016-03-05 NOTE — Progress Notes (Signed)
Name: Marie Vaughan   MRN: [id number]    DOB: 1971-03-12   Date:03/05/2016       Progress Note  Subjective  Chief Complaint  Chief Complaint  Patient presents with  . Ear Fullness    left side since Saturday    HPI  HTN: taking HCTZ, she has urinary frequency secondary to the diuretic, no chest pain, dizziness, or palpitation  Ear fullness: she noticed left ear fullness over the past 2 days, she has a history of increase cerumen production. No hearing loss, no fever or ear pain.   Patient Active Problem List   Diagnosis Date Noted  . OSA on CPAP 02/03/2016  . 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 Abnormal brain MRI 12/20/2015  . Dizziness 12/13/2015  . Near syncope 12/10/2015  . Atypical chest pain 08/22/2013  . Essential hypertension 08/22/2013  . Angio-edema   . GERD (gastroesophageal reflux disease)   . Migraine with aura and without status migrainosus   . Acne   . Anxiety   . Allergic rhinitis 10/05/2011  . Insomnia 08/30/2011  . Obesity 08/30/2011    Past Surgical History:  Procedure Laterality Date  . ABDOMINAL HYSTERECTOMY  04-25-10   Due to abnormal PAP  . CARDIAC CATHETERIZATION  04/2014   [facility]  . DILATION AND CURETTAGE OF UTERUS    . KNEE ARTHROSCOPY Left 06/10/2013   Procedure: LEFT KNEE ARTHROSCOPY KNEE WITH PARTIAL MEDIAL MENISCECTOMY ;  Surgeon: Lorn Junes, MD;  Location: [facility];  Service: Orthopedics;  Laterality: Left;  xerofoam, 4x4's, webril, ace wrap, ice wrap  . KNEE ARTHROSCOPY WITH MEDIAL MENISECTOMY Right 06/10/2013   Procedure: RIGHT KNEE ARTHROSCOPY WITH MEDIAL MENISECTOMY;  Surgeon: Lorn Junes, MD;  Location: [facility];  Service: Orthopedics;  Laterality: Right;  partial lateral menisectomoy and chondroplasty  . OVARIAN CYST REMOVAL  04-25-10    Family History  Problem Relation Age of Onset  . Early death Mother     Murdered when pt was 58 years old  . Diabetes Mother   . Hypertension Mother   .  Gout Mother   . Kidney failure Mother   . Early death Sister 69    due to hemorage  . Alcohol abuse Maternal Uncle   . Alcohol abuse Other   . Arthritis Other   . Heart disease Other   . Other Other     cousin- phlebitis  . Diabetes Maternal Aunt   . Hyperlipidemia Maternal Aunt   . Hypertension Maternal Aunt   . Prostate cancer Maternal Uncle     Social History   Social History  . Marital status: Single    Spouse name: N/A  . Number of children: 3  . Years of education: N/A   Occupational History  . Medical records - [facility]    Social History Main Topics  . Smoking status: Never Smoker  . Smokeless tobacco: Never Used  . Alcohol use No  . Drug use: No  . Sexual activity: Yes    Partners: Male   Other Topics Concern  . Not on file   Social History Narrative   Lives in [place] with husband, two children gone - one in college, and older daughter already out of the house. Works at [employer].                 Current Outpatient Prescriptions:  .  ALPRAZolam (XANAX) 0.5 MG tablet, TAKE 1 TABLET BY MOUTH TWICE DAILY AS NEEDED  FOR ANXIETY., Disp: 30 tablet, Rfl: 5 .  aspirin EC 81 MG tablet, Take 1 tablet (81 mg total) by mouth daily., Disp: 90 tablet, Rfl: 3 .  Elastic Bandages & Supports (MEDICAL COMPRESSION STOCKINGS) MISC, 2 Units by Does not apply route daily., Disp: 2 each, Rfl: 0 .  esomeprazole (NEXIUM) 40 MG capsule, Take 1 capsule (40 mg total) by mouth daily before breakfast., Disp: , Rfl:  .  hydrochlorothiazide (MICROZIDE) 12.5 MG capsule, Take 1 capsule (12.5 mg total) by mouth daily., Disp: 30 capsule, Rfl: 2 .  topiramate (TOPAMAX) 25 MG capsule, Take 50 mg by mouth at bedtime., Disp: , Rfl:   Allergies  Allergen Reactions  . Chocolate     Hives, itching  . Iohexol     CP and SOB  . Peanut-Containing Drug Products Swelling    cashews     ROS  Ten systems reviewed and is negative except as mentioned in HPI   Objective  Vitals:    03/05/16 0912  BP: 122/78  Pulse: 94  Resp: 18  Temp: 98.2 F (36.8 C)  SpO2: 98%  Weight: 217 lb 2 oz (98.5 kg)  Height: '5\' 2"'  (1.575 m)    Body mass index is 39.71 kg/m.  Physical Exam Constitutional: Patient appears well-developed and well-nourished. Obese  No distress.  HEENT: head atraumatic, normocephalic, pupils equal and reactive to light, ears wax on both ear canals and impacted on left side, neck supple, throat within normal limits Cardiovascular: Normal rate, regular rhythm and normal heart sounds.  No murmur heard. No BLE edema. Pulmonary/Chest: Effort normal and breath sounds normal. No respiratory distress. Abdominal: Soft.  There is no tenderness. Psychiatric: Patient has a normal mood and affect. behavior is normal. Judgment and thought content normal.  Recent Results (from the past 2160 hour(s))  CBC with Differential/Platelet     Status: Abnormal   Collection Time: 12/07/15  8:44 AM  Result Value Ref Range   WBC 6.5 3.4 - 10.8 x10E3/uL   RBC 3.99 3.77 - 5.28 x10E6/uL   Hemoglobin 11.2 11.1 - 15.9 g/dL   Hematocrit 33.9 (L) 34.0 - 46.6 %   MCV 85 79 - 97 fL   MCH 28.1 26.6 - 33.0 pg   MCHC 33.0 31.5 - 35.7 g/dL   RDW 13.9 12.3 - 15.4 %   Platelets 299 150 - 379 x10E3/uL   Neutrophils 61 %   Lymphs 31 %   Monocytes 6 %   Eos 1 %   Basos 0 %   Neutrophils Absolute 4.0 1.4 - 7.0 x10E3/uL   Lymphocytes Absolute 2.0 0.7 - 3.1 x10E3/uL   Monocytes Absolute 0.4 0.1 - 0.9 x10E3/uL   EOS (ABSOLUTE) 0.1 0.0 - 0.4 x10E3/uL   Basophils Absolute 0.0 0.0 - 0.2 x10E3/uL   Immature Granulocytes 1 %   Immature Grans (Abs) 0.0 0.0 - 0.1 x10E3/uL  Comprehensive metabolic panel     Status: None   Collection Time: 12/07/15  8:44 AM  Result Value Ref Range   Glucose 93 65 - 99 mg/dL   BUN 10 6 - 24 mg/dL   Creatinine, Ser 0.77 0.57 - 1.00 mg/dL   GFR calc non Af Amer 93 >59 mL/min/1.73   GFR calc Af Amer 107 >59 mL/min/1.73   BUN/Creatinine Ratio 13 9 - 23    Sodium 141 134 - 144 mmol/L   Potassium 4.0 3.5 - 5.2 mmol/L   Chloride 103 96 - 106 mmol/L   CO2 23 18 -  29 mmol/L   Calcium 9.2 8.7 - 10.2 mg/dL   Total Protein 6.3 6.0 - 8.5 g/dL   Albumin 3.9 3.5 - 5.5 g/dL   Globulin, Total 2.4 1.5 - 4.5 g/dL   Albumin/Globulin Ratio 1.6 1.2 - 2.2   Bilirubin Total <0.2 0.0 - 1.2 mg/dL   Alkaline Phosphatase 57 39 - 117 IU/L   AST 14 0 - 40 IU/L   ALT 14 0 - 32 IU/L  TSH     Status: None   Collection Time: 12/07/15  8:44 AM  Result Value Ref Range   TSH 1.310 0.450 - 4.500 uIU/mL  Basic metabolic panel     Status: Abnormal   Collection Time: 12/10/15 11:35 AM  Result Value Ref Range   Sodium 136 135 - 145 mmol/L   Potassium 3.3 (L) 3.5 - 5.1 mmol/L   Chloride 103 101 - 111 mmol/L   CO2 25 22 - 32 mmol/L   Glucose, Bld 89 65 - 99 mg/dL   BUN 12 6 - 20 mg/dL   Creatinine, Ser 0.76 0.44 - 1.00 mg/dL   Calcium 9.9 8.9 - 10.3 mg/dL   GFR calc non Af Amer >60 >60 mL/min   GFR calc Af Amer >60 >60 mL/min    Comment: (NOTE) The eGFR has been calculated using the CKD EPI equation. This calculation has not been validated in all clinical situations. eGFR's persistently <60 mL/min signify possible Chronic Kidney Disease.    Anion gap 8 5 - 15  CBC     Status: None   Collection Time: 12/10/15 11:35 AM  Result Value Ref Range   WBC 8.1 3.6 - 11.0 K/uL   RBC 4.51 3.80 - 5.20 MIL/uL   Hemoglobin 12.7 12.0 - 16.0 g/dL   HCT 37.7 35.0 - 47.0 %   MCV 83.6 80.0 - 100.0 fL   MCH 28.1 26.0 - 34.0 pg   MCHC 33.6 32.0 - 36.0 g/dL   RDW 14.2 11.5 - 14.5 %   Platelets 301 150 - 440 K/uL  Urinalysis complete, with microscopic     Status: Abnormal   Collection Time: 12/10/15 11:35 AM  Result Value Ref Range   Color, Urine YELLOW (A) YELLOW   APPearance CLEAR (A) CLEAR   Glucose, UA NEGATIVE NEGATIVE mg/dL   Bilirubin Urine NEGATIVE NEGATIVE   Ketones, ur NEGATIVE NEGATIVE mg/dL   Specific Gravity, Urine 1.015 1.005 - 1.030   Hgb urine dipstick  NEGATIVE NEGATIVE   pH 6.0 5.0 - 8.0   Protein, ur NEGATIVE NEGATIVE mg/dL   Nitrite NEGATIVE NEGATIVE   Leukocytes, UA NEGATIVE NEGATIVE   RBC / HPF NONE SEEN 0 - 5 RBC/hpf   WBC, UA 0-5 0 - 5 WBC/hpf   Bacteria, UA NONE SEEN NONE SEEN   Squamous Epithelial / LPF 0-5 (A) NONE SEEN   Mucous PRESENT   Brain natriuretic peptide     Status: None   Collection Time: 12/10/15 11:35 AM  Result Value Ref Range   B Natriuretic Peptide 6.0 0.0 - 100.0 pg/mL  Pregnancy, urine     Status: None   Collection Time: 12/10/15 11:35 AM  Result Value Ref Range   Preg Test, Ur NEGATIVE NEGATIVE  Fibrin derivatives D-Dimer     Status: None   Collection Time: 12/10/15 11:35 AM  Result Value Ref Range   Fibrin derivatives D-dimer ([facility]) 431 0 - 499    Comment: <> Exclusion of Venous Thromboembolism (VTE) - OUTPATIENTS ONLY        (  Emergency Department or [place])             0-499 ng/ml (FEU)  : With a low to intermediate pretest                                        probability for VTE this test result                                        excludes the diagnosis of VTE.           > 499 ng/ml (FEU)  : VTE not excluded.  Additional work up                                   for VTE is required.   <>  Testing on Inpatients and Evaluation of Disseminated Intravascular        Coagulation (DIC)             Reference Range:   0-499 ng/ml (FEU)   Glucose, capillary     Status: Abnormal   Collection Time: 12/10/15  1:00 PM  Result Value Ref Range   Glucose-Capillary 61 (L) 65 - 99 mg/dL  Troponin I     Status: None   Collection Time: 12/10/15  2:17 PM  Result Value Ref Range   Troponin I <0.03 <0.031 ng/mL    Comment:        NO INDICATION OF MYOCARDIAL INJURY.   Lactic acid, plasma     Status: None   Collection Time: 12/10/15  2:17 PM  Result Value Ref Range   Lactic Acid, Venous 1.2 0.5 - 2.0 mmol/L  Hepatic function panel     Status: Abnormal   Collection Time: 12/10/15  2:17 PM  Result  Value Ref Range   Total Protein 8.0 6.5 - 8.1 g/dL   Albumin 4.3 3.5 - 5.0 g/dL   AST 21 15 - 41 U/L   ALT 21 14 - 54 U/L   Alkaline Phosphatase 51 38 - 126 U/L   Total Bilirubin 0.5 0.3 - 1.2 mg/dL   Bilirubin, Direct <0.1 (L) 0.1 - 0.5 mg/dL   Indirect Bilirubin NOT CALCULATED 0.3 - 0.9 mg/dL  ECHO COMPLETE     Status: None   Collection Time: 12/10/15  3:55 PM  Result Value Ref Range   Weight 3,376 oz   Height 62 in   BP 118/79 mmHg  Lactic acid, plasma     Status: None   Collection Time: 12/10/15  4:35 PM  Result Value Ref Range   Lactic Acid, Venous 0.9 0.5 - 2.0 mmol/L  Troponin I     Status: None   Collection Time: 12/10/15  7:53 PM  Result Value Ref Range   Troponin I <0.03 <0.031 ng/mL    Comment:        NO INDICATION OF MYOCARDIAL INJURY.   Troponin I     Status: None   Collection Time: 12/11/15  2:19 AM  Result Value Ref Range   Troponin I <0.03 <0.031 ng/mL    Comment:        NO INDICATION OF MYOCARDIAL INJURY.   Miscellaneous [facility] test (send-out)     Status: None   Collection Time:  12/11/15  2:19 AM  Result Value Ref Range   [facility] test code 4,051    [facility] test name CORTISOL    Source ([facility]) 0.8ML SERUM REFRIG    Misc [facility] result COMMENT     Comment: (NOTE) Test Ordered: 588325 Cortisol Cortisol                       1.5              ug/dL    BN                          Cortisol AM         6.2 - 19.4                        Cortisol PM         2.3 - 11.9 Performed At: [facility] [place], [place] [id number] Lindon Romp MD [id number] Performed at [facility]   TSH     Status: None   Collection Time: 12/11/15  2:23 AM  Result Value Ref Range   TSH 2.721 0.350 - 4.500 uIU/mL  Basic metabolic panel     Status: Abnormal   Collection Time: 12/11/15  2:23 AM  Result Value Ref Range   Sodium 137 135 - 145 mmol/L   Potassium 3.2 (L) 3.5 - 5.1 mmol/L   Chloride 105 101 - 111 mmol/L   CO2 25 22 - 32  mmol/L   Glucose, Bld 111 (H) 65 - 99 mg/dL   BUN 10 6 - 20 mg/dL   Creatinine, Ser 0.79 0.44 - 1.00 mg/dL   Calcium 9.0 8.9 - 10.3 mg/dL   GFR calc non Af Amer >60 >60 mL/min   GFR calc Af Amer >60 >60 mL/min    Comment: (NOTE) The eGFR has been calculated using the CKD EPI equation. This calculation has not been validated in all clinical situations. eGFR's persistently <60 mL/min signify possible Chronic Kidney Disease.    Anion gap 7 5 - 15  CBC     Status: Abnormal   Collection Time: 12/11/15  2:23 AM  Result Value Ref Range   WBC 8.0 3.6 - 11.0 K/uL   RBC 4.22 3.80 - 5.20 MIL/uL   Hemoglobin 11.9 (L) 12.0 - 16.0 g/dL   HCT 35.4 35.0 - 47.0 %   MCV 84.0 80.0 - 100.0 fL   MCH 28.2 26.0 - 34.0 pg   MCHC 33.6 32.0 - 36.0 g/dL   RDW 14.5 11.5 - 14.5 %   Platelets 276 150 - 440 K/uL  TSH     Status: None   Collection Time: 12/12/15  5:59 AM  Result Value Ref Range   TSH 0.969 0.350 - 4.500 uIU/mL  Cortisol-am, blood     Status: None   Collection Time: 12/12/15  5:59 AM  Result Value Ref Range   Cortisol - AM 15.1 6.7 - 22.6 ug/dL    Comment: Performed at [facility]  Vitamin B1     Status: None   Collection Time: 12/12/15  1:46 PM  Result Value Ref Range   Vitamin B1 (Thiamine) 117.8 66.5 - 200.0 nmol/L    Comment: (NOTE) Performed At: [facility] [address] [place], [place] [id number] Lindon Romp MD [id number]   Homocysteine     Status: None   Collection Time: 12/12/15  1:46 PM  Result Value Ref Range   Homocysteine 13.4 0.0 - 15.0 umol/L    Comment: (NOTE) Performed At: [facility] [place], [place] [id number] Lindon Romp MD [id number]   Protime-INR     Status: None   Collection Time: 12/12/15  1:46 PM  Result Value Ref Range   Prothrombin Time 14.1 11.4 - 15.0 seconds   INR 1.07   APTT     Status: None   Collection Time: 12/12/15  1:46 PM  Result Value Ref Range   aPTT 30 24 - 36 seconds   Sedimentation rate     Status: Abnormal   Collection Time: 12/12/15  1:46 PM  Result Value Ref Range   Sed Rate 41 (H) 0 - 20 mm/hr  C-reactive protein     Status: None   Collection Time: 12/12/15  1:46 PM  Result Value Ref Range   CRP 0.8 <1.0 mg/dL    Comment: Performed at [place] antibodies     Status: None   Collection Time: 12/12/15  1:46 PM  Result Value Ref Range   B burgdorferi Ab IgG+IgM <0.91 0.00 - 0.90 ISR    Comment: (NOTE)                                Negative         <0.91                                Equivocal  0.91 - 1.09                                Positive         >1.09 Performed At: [facility] [place], [place] [id number] Lindon Romp MD [id number]   Vitamin B12     Status: None   Collection Time: 12/12/15  1:46 PM  Result Value Ref Range   Vitamin B-12 312 180 - 914 pg/mL    Comment: (NOTE) This assay is not validated for testing neonatal or myeloproliferative syndrome specimens for Vitamin B12 levels. Performed at [facility] metabolic panel     Status: Abnormal   Collection Time: 12/13/15  4:09 AM  Result Value Ref Range   Sodium 137 135 - 145 mmol/L   Potassium 3.7 3.5 - 5.1 mmol/L   Chloride 106 101 - 111 mmol/L   CO2 26 22 - 32 mmol/L   Glucose, Bld 100 (H) 65 - 99 mg/dL   BUN 12 6 - 20 mg/dL   Creatinine, Ser 0.90 0.44 - 1.00 mg/dL   Calcium 9.0 8.9 - 10.3 mg/dL   GFR calc non Af Amer >60 >60 mL/min   GFR calc Af Amer >60 >60 mL/min    Comment: (NOTE) The eGFR has been calculated using the CKD EPI equation. This calculation has not been validated in all clinical situations. eGFR's persistently <60 mL/min signify possible Chronic Kidney Disease.    Anion gap 5 5 - 15  Protein and glucose, CSF     Status: None   Collection Time: 12/13/15 10:00 AM  Result Value Ref Range   Glucose, CSF 60 40 - 70 mg/dL   Total  Protein, CSF 36 15 - 45 mg/dL  Oligoclonal  bands, CSF + serm     Status: None   Collection Time: 12/13/15 10:00 AM  Result Value Ref Range   CSF Oligoclonal Bands Comment     Comment: (NOTE) Zero (0) oligoclonal bands were observed in the CSF. Interpretation: Criteria for Positivity: Four (4) or more oligoclonal bands observed  only in the CSF have been shown to be most consistent with MS  using our method. [Fortini AS, Sanders EL, Weinshenker BG, and Katzmann JA: Cerebrospinal Fluid Oligoclonal Bands in the Diagnosis  of Multiple Sclerosis. Am J Clin Pathol 120(5):672-675, 2003]. Oligoclonal bands that are present only in the CSF have been associated with a variety of inflammatory brain diseases such as multiple sclerosis (MS), subacute encephalitis, neurosyphilis, etc.  Increased IgG in the CSF is not specific for MS, but is an indication  of chronic neural inflammation. Clinical correlation indicated. Approximately 2-3% of clinically confirmed MS patients show little or no evidence of oligoclonal bands in the CSF; however oligoclonal  bands may develop as the disease progresses. Oligoclonal Banding testing performed using Isoelectric Focusing ( IEF) and immunoblotting methodology. Performed At: [facility] [place], [place] [id number] Lindon Romp MD [id number]   CSF IgG index     Status: None   Collection Time: 12/13/15 10:00 AM  Result Value Ref Range   IgG, CSF 2.7 0.0 - 8.6 mg/dL    Comment: (NOTE) Performed At: [facility] [address] [place], [place] [id number] Lindon Romp MD [id number]   Myelin basic protein, CSF     Status: None   Collection Time: 12/13/15 10:00 AM  Result Value Ref Range   Myelin Basic Protein 0.8 0.0 - 1.2 ng/mL    Comment: (NOTE) Results for this test are for research purposes only by the assay's manufacturer.  The performance characteristics of this product have not been established.  Results should not be used as a  diagnostic procedure without confirmation of the diagnosis by another medically established diagnostic product or procedure. Performed At: [facility] [place], [place] [id number] Lindon Romp MD [id number]   B. burgdorfi antibodies, CSF     Status: None   Collection Time: 12/13/15 10:00 AM  Result Value Ref Range   Lyme Ab Comment     Comment: (NOTE) The IgG value was not calculated because B. burgdorferi IgG was normal. REFERENCE RANGE: 0.0 - 0.9 Index    B. Burgdorferi IgM Ab Index Comment     Comment: (NOTE) The IgM value was not calculated because B. burgdorferi IgM was normal. Unable to calculate, IgM CSF value is below limit of quantification. The calculations of B. burgdorferi IgG and IgM Antibodies Index Ratio are intended to assist in the diagnosis of neuroborreliosis.  The diagnosis of neuroborreliosis is primarily a clinical one. Laboratory data should only be used as a supplement to clinical impression. These calculations are meaningful only in cases where the B. burgdorferi IgG or IgM in CSF is elevated. The reference ranges for the calculated indexes have bee adopted from published ranges (refer to technical brief). REFERENCE RANGE: 0.0 - 0.9 Index    B. Burgdorferi IgG Serum <0.20 0.00 - 0.80 Index    Comment: (NOTE) Result Interpretation: Negative:     < 0.80 Equivocal:     > or = 0.81 and < 1.20 Positive:     > or = 1.20    B. burgdorferi IgG CSF <0.08 0.00 - 0.09 Index    Comment: (NOTE) Result  Interpretation: Negative:      < or = 0.09 Positive:     > 0.09    B. Burgdorferi IgM Serum 0.27 0.00 - 0.80 Index    Comment: (NOTE) Result Interpretation: Negative:     < 0.80 Equivocal:     > or = 0.81 and < 1.20 Positive:     > or = 1.20    B. burgdorferi IgM CSF <0.06 0.00 - 0.06 Index    Comment: (NOTE) Result Interpretation: Negative:     < or = 0.06 Positive:     > 0.06    IgG Total Serum 928 540 - 1,423 mg/dl    IgM Total Serum 96 52 - 217 mg/dl   Albumin Serum 3,900 3,700 - 5,100 mg/dl   IgG Total CSF 2.80 0.00 - 3.06 mg/dl   IgM Total CSF <1.6 0.0 - 1.6 mg/dL    Comment: NOTE: New reference range effective September 30, 2015.   Albumin CSF 18.6 10.4 - 43.2 mg/dL   Albumin Index Ratio 4.8 < 9.0 Index   IgG Index 0.63     Comment: REFERENCE RANGE: 0.00   0.69 Index    IgM Index Comment     Comment: (NOTE) unable to calculate, IgM CSF value is below limit of quantitation REFERENCE RANGE: 0.00  - 0.09 Index    IgG (loc) Comment     Comment: (NOTE) unable to calculate, IgG CSF value is below limit of quantitation REFERENCE RANGE: 0.000 - 0.099 mg/dL    IgM (loc) Comment     Comment: (NOTE) unable to calculate, IgM CSF value is below limit of quantitation REFERENCE RANGE: 0.000 -0.009 mg/dL    CNS IgG Synthesis Rate 0.6     Comment: (NOTE) The performance characteristics of the listed CSF Assays were validated by [facility]. The [place] FDA has not approved or cleared these tests. The results of these assays can be used for clinical diagnosis without FDA approval. [employer] is a Holiday representative, CAP accredited laboratory for performing high complexity assays such as these. REFERENCE RANGE: 0.0 - 3.2 mg/24hr Performed At: [facility] [place], [place] [id number] Tinghitella Gwen Her PhD [id number]   CSF cell count with differential collection tube #: 1     Status: Abnormal   Collection Time: 12/13/15 10:00 AM  Result Value Ref Range   Tube # 1    Color, CSF COLORLESS COLORLESS   Appearance, CSF CLEAR (A) CLEAR   RBC Count, CSF 166 (H) 0 - 3 /cu mm   WBC, CSF 17 /cu mm   Segmented Neutrophils-CSF 0 %   Lymphs, CSF 91 %   Monocyte-Macrophage-Spinal Fluid 9 %   Eosinophils, CSF 0 %  CSF culture     Status: None   Collection Time: 12/13/15 10:00 AM  Result Value Ref Range   Specimen Description CSF    Special Requests Normal     Gram Stain      FEW RED BLOOD CELLS RARE WBC SEEN NO ORGANISMS SEEN    Culture NO GROWTH 3 DAYS    Report Status 12/16/2015 FINAL   VDRL, CSF     Status: None   Collection Time: 12/13/15 10:00 AM  Result Value Ref Range   VDRL Quant, CSF Non Reactive Non Rea:<1:1    Comment: (NOTE) Performed At: [facility] [address] [place], [place] [id number] Lindon Romp MD [id number]   Potassium     Status: None  Collection Time: 12/28/15  3:41 PM  Result Value Ref Range   Potassium 3.7 3.5 - 5.2 mmol/L      PHQ2/9: Depression screen [facility] 2/9 03/05/2016 12/05/2015 04/20/2015  Decreased Interest 0 0 0  Down, Depressed, Hopeless 0 0 0  PHQ - 2 Score 0 0 0     Fall Risk: Fall Risk  03/05/2016 12/05/2015 04/20/2015 03/10/2015 01/18/2015  Falls in the past year? No No No No No     Functional Status Survey: Is the patient deaf or have difficulty hearing?: No Does the patient have difficulty seeing, even when wearing glasses/contacts?: No Does the patient have difficulty concentrating, remembering, or making decisions?: No Does the patient have difficulty walking or climbing stairs?: No Does the patient have difficulty dressing or bathing?: No Does the patient have difficulty doing errands alone such as visiting a doctor's office or shopping?: No    Assessment & Plan  1. Essential hypertension  - hydrochlorothiazide (MICROZIDE) 12.5 MG capsule; Take 1 capsule (12.5 mg total) by mouth daily.  Dispense: 30 capsule; Refill: 2  2. Ear fullness, left  Verbal consent given Possible side effects discussed with patient Ears were  lavaged with warm water and peroxide  Patient tolerated procedure well No complications

## 2016-04-02 DIAGNOSIS — G4733 Obstructive sleep apnea (adult) (pediatric): Secondary | ICD-10-CM | POA: Diagnosis not present

## 2016-04-24 DIAGNOSIS — H5213 Myopia, bilateral: Secondary | ICD-10-CM | POA: Diagnosis not present

## 2016-05-11 ENCOUNTER — Ambulatory Visit: Payer: 59 | Admitting: Family Medicine

## 2016-06-20 ENCOUNTER — Ambulatory Visit: Payer: 59 | Admitting: Family Medicine

## 2016-06-25 ENCOUNTER — Ambulatory Visit: Payer: Self-pay | Admitting: Physician Assistant

## 2016-06-25 ENCOUNTER — Other Ambulatory Visit: Payer: Self-pay | Admitting: Family Medicine

## 2016-06-25 ENCOUNTER — Ambulatory Visit (INDEPENDENT_AMBULATORY_CARE_PROVIDER_SITE_OTHER): Payer: 59 | Admitting: Family Medicine

## 2016-06-25 ENCOUNTER — Encounter: Payer: Self-pay | Admitting: Family Medicine

## 2016-06-25 ENCOUNTER — Other Ambulatory Visit: Payer: Self-pay

## 2016-06-25 VITALS — BP 132/82 | HR 100 | Temp 98.0°F | Resp 16 | Ht 62.0 in | Wt 220.1 lb

## 2016-06-25 DIAGNOSIS — I10 Essential (primary) hypertension: Secondary | ICD-10-CM

## 2016-06-25 DIAGNOSIS — Z13220 Encounter for screening for lipoid disorders: Secondary | ICD-10-CM | POA: Diagnosis not present

## 2016-06-25 DIAGNOSIS — G4733 Obstructive sleep apnea (adult) (pediatric): Secondary | ICD-10-CM

## 2016-06-25 DIAGNOSIS — E538 Deficiency of other specified B group vitamins: Secondary | ICD-10-CM | POA: Diagnosis not present

## 2016-06-25 DIAGNOSIS — R9089 Other abnormal findings on diagnostic imaging of central nervous system: Secondary | ICD-10-CM

## 2016-06-25 DIAGNOSIS — R609 Edema, unspecified: Secondary | ICD-10-CM | POA: Diagnosis not present

## 2016-06-25 DIAGNOSIS — D649 Anemia, unspecified: Secondary | ICD-10-CM | POA: Diagnosis not present

## 2016-06-25 DIAGNOSIS — G43809 Other migraine, not intractable, without status migrainosus: Secondary | ICD-10-CM

## 2016-06-25 DIAGNOSIS — Z9989 Dependence on other enabling machines and devices: Secondary | ICD-10-CM

## 2016-06-25 DIAGNOSIS — R42 Dizziness and giddiness: Secondary | ICD-10-CM

## 2016-06-25 DIAGNOSIS — H9202 Otalgia, left ear: Secondary | ICD-10-CM | POA: Diagnosis not present

## 2016-06-25 DIAGNOSIS — G43109 Migraine with aura, not intractable, without status migrainosus: Secondary | ICD-10-CM

## 2016-06-25 DIAGNOSIS — R739 Hyperglycemia, unspecified: Secondary | ICD-10-CM | POA: Diagnosis not present

## 2016-06-25 MED ORDER — HYDROCHLOROTHIAZIDE 12.5 MG PO CAPS: 12.5000 mg | ORAL_CAPSULE | Freq: Every day | ORAL | 2 refills | Status: DC

## 2016-06-25 MED ORDER — HYDROCHLOROTHIAZIDE 12.5 MG PO CAPS: 12.5000 mg | ORAL_CAPSULE | Freq: Every day | ORAL | 0 refills | Status: DC

## 2016-06-25 MED ORDER — B-12 1000 MCG SL SUBL: 1.0000 | SUBLINGUAL_TABLET | Freq: Every day | SUBLINGUAL | 0 refills | Status: DC

## 2016-06-25 NOTE — Telephone Encounter (Signed)
What about next Monday pm?

## 2016-06-25 NOTE — Progress Notes (Signed)
Name: Marie Vaughan   MRN: [id number]    DOB: Jul 25, 1970   Date:06/25/2016       Progress Note  Subjective  Chief Complaint  Chief Complaint  Patient presents with  . Medication Refill  . Ear Fullness    left ear pain and fullness since this morning     HPI  HTN: she has not been taking HCTZ for over one month, but is feeling puffy and would like to get a refill. She denies chest pain, dizziness, or palpitation. She has been feeling dizzy again, and went to [facility] two days ago and bp was low 90's/60's.   Migraine/vestibular: She was doing well on Topamax 50 mg daily, but over the past 6 weeks she has noticed worsening of symptoms. Initially intermittent and she attributed to switching jobs. However over the past few weeks she started to have dizziness again. Described as spinning sensation. Sometimes it affects her balance. Episodes can last hours, and she needs to sit still. It also improves when she takes a nap.   OSA: moderate to severe based on sleep study, started on CPAP at 9 cm H2O July 2017, no longer snoring, feeling alert during the day, fatigue and headaches resolved initially, but now she is having episodes of headaches again. No problems tolerating mask.   Ear pain left : she noticed left ear fullness since this morning, described as sharp, no hearing loss or fever. She has long history of bilateral tinnitus.   Patient Active Problem List   Diagnosis Date Noted  . OSA on CPAP 02/03/2016  . 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 Abnormal brain MRI 12/20/2015  . Dizziness 12/13/2015  . Near syncope 12/10/2015  . Atypical chest pain 08/22/2013  . Essential hypertension 08/22/2013  . Angio-edema   . GERD (gastroesophageal reflux disease)   . Migraine with aura and without status migrainosus   . Acne   . Anxiety   . Allergic rhinitis 10/05/2011  . Insomnia 08/30/2011  . Obesity 08/30/2011    Past Surgical History:  Procedure Laterality Date  . ABDOMINAL HYSTERECTOMY   04-25-10   Due to abnormal PAP  . CARDIAC CATHETERIZATION  04/2014   [facility]  . DILATION AND CURETTAGE OF UTERUS    . KNEE ARTHROSCOPY Left 06/10/2013   Procedure: LEFT KNEE ARTHROSCOPY KNEE WITH PARTIAL MEDIAL MENISCECTOMY ;  Surgeon: Lorn Junes, MD;  Location: [place];  Service: Orthopedics;  Laterality: Left;  xerofoam, 4x4's, webril, ace wrap, ice wrap  . KNEE ARTHROSCOPY WITH MEDIAL MENISECTOMY Right 06/10/2013   Procedure: RIGHT KNEE ARTHROSCOPY WITH MEDIAL MENISECTOMY;  Surgeon: Lorn Junes, MD;  Location: [place];  Service: Orthopedics;  Laterality: Right;  partial lateral menisectomoy and chondroplasty  . OVARIAN CYST REMOVAL  04-25-10    Family History  Problem Relation Age of Onset  . Early death Mother     Murdered when pt was 13 years old  . Diabetes Mother   . Hypertension Mother   . Gout Mother   . Kidney failure Mother   . Early death Sister 16    due to hemorage  . Alcohol abuse Maternal Uncle   . Alcohol abuse Other   . Arthritis Other   . Heart disease Other   . Other Other     cousin- phlebitis  . Diabetes Maternal Aunt   . Hyperlipidemia Maternal Aunt   . Hypertension Maternal Aunt   . Prostate cancer Maternal Uncle     Social  History   Social History  . Marital status: Married    Spouse name: N/A  . Number of children: 3  . Years of education: N/A   Occupational History  . Health information manager  [employer]   Social History Main Topics  . Smoking status: Never Smoker  . Smokeless tobacco: Never Used  . Alcohol use No  . Drug use: No  . Sexual activity: Yes    Partners: Male   Other Topics Concern  . Not on file   Social History Narrative   Lives in [place] with husband, two children gone . Daughter's are grown and out of the house. Her 46 yo is going to college                 Current Outpatient Prescriptions:  .  topiramate (TOPAMAX) 50 MG tablet, Take 50 mg by mouth 2 (two) times daily.,  Disp: , Rfl:  .  ALPRAZolam (XANAX) 0.5 MG tablet, TAKE 1 TABLET BY MOUTH TWICE DAILY AS NEEDED FOR ANXIETY., Disp: 30 tablet, Rfl: 5 .  aspirin EC 81 MG tablet, Take 1 tablet (81 mg total) by mouth daily., Disp: 90 tablet, Rfl: 3 .  Cyanocobalamin (B-12) 1000 MCG SUBL, Place 1 tablet under the tongue daily., Disp: 30 each, Rfl: 0 .  Elastic Bandages & Supports (MEDICAL COMPRESSION STOCKINGS) MISC, 2 Units by Does not apply route daily., Disp: 2 each, Rfl: 0 .  esomeprazole (NEXIUM) 40 MG capsule, Take 1 capsule (40 mg total) by mouth daily before breakfast., Disp: , Rfl:  .  hydrochlorothiazide (MICROZIDE) 12.5 MG capsule, Take 1 capsule (12.5 mg total) by mouth daily., Disp: 30 capsule, Rfl: 2  Allergies  Allergen Reactions  . Chocolate     Hives, itching  . Iohexol     CP and SOB  . Peanut-Containing Drug Products Swelling    cashews     ROS  Constitutional: Negative for fever or weight change.  Respiratory: Negative for cough and shortness of breath.   Cardiovascular: Negative for chest pain or palpitations.  Gastrointestinal: Negative for abdominal pain, no bowel changes.  Musculoskeletal: positive  for gait problem ( from dizziness ) no  joint swelling.  Skin: Negative for rash.  Neurological: positive  for dizziness and intermittent headaches.  No other specific complaints in a complete review of systems (except as listed in HPI above).  Objective  Vitals:   06/25/16 1326  BP: 132/82  Pulse: 100  Resp: 16  Temp: 98 F (36.7 C)  TempSrc: Oral  SpO2: 98%  Weight: 220 lb 2 oz (99.8 kg)  Height: 5\' 2"  (1.575 m)    Body mass index is 40.26 kg/m.  Physical Exam   Constitutional: Patient appears well-developed and well-nourished. Obese No distress.  HEENT: head atraumatic, normocephalic, pupils equal and reactive to light, ears normal bilaterally, pain during palpation of left posterior ear, and with left ear lobe movement, but no redness, fluctuance or increase in  warmth, normal TMJ, no nystagmus , neck supple, throat within normal limits Cardiovascular: Normal rate, regular rhythm and normal heart sounds.  No murmur heard. No BLE edema. Pulmonary/Chest: Effort normal and breath sounds normal. No respiratory distress. Abdominal: Soft.  There is no tenderness. Psychiatric: Patient has a normal mood and affect. behavior is normal. Judgment and thought content normal. Neurological: normal cranial nerves, no focal findings. Normal grip and no nystagmus  PHQ2/9: Depression screen [facility] 2/9 03/05/2016 12/05/2015 04/20/2015  Decreased Interest 0 0 0  Down, Depressed,  Hopeless 0 0 0  PHQ - 2 Score 0 0 0     Fall Risk: Fall Risk  03/05/2016 12/05/2015 04/20/2015 03/10/2015 01/18/2015  Falls in the past year? No No No No No     Assessment & Plan  1. Essential hypertension  - hydrochlorothiazide (MICROZIDE) 12.5 MG capsule; Take 1 capsule (12.5 mg total) by mouth daily.  Dispense: 30 capsule; Refill: 2 - COMPLETE METABOLIC PANEL WITH GFR  2. Abnormal brain MRI  She will have it repeated, ordered by Dr. Manuella Ghazi  3. OSA on CPAP  Wears it most of the time  4. Vestibular migraine  - Ambulatory referral to Neurology  5. Migraine with aura and without status migrainosus, not intractable  - Ambulatory referral to Neurology  6. Hyperglycemia  - Hemoglobin A1c  7. Dizziness  - Ambulatory referral to Neurology  8. Edema, unspecified type  Explained that it is a bp medication and if bp is low not to take it - hydrochlorothiazide (MICROZIDE) 12.5 MG capsule; Take 1 capsule (12.5 mg total) by mouth daily.  Dispense: 30 capsule; Refill: 2 - COMPLETE METABOLIC PANEL WITH GFR  9. B12 deficiency  - Vitamin B12 - Cyanocobalamin (B-12) 1000 MCG SUBL; Place 1 tablet under the tongue daily.  Dispense: 30 each; Refill: 0  10. Mild anemia  - CBC with Differential/Platelet  11. Lipid screening  - Lipid panel  12. Ear pain, left  It may be secondary to  CPAP machine straps. She had to tighten straps because air was leaking out, and woke up sleeping on left side. Advised nsaid's and call back if no improvement

## 2016-06-25 NOTE — Telephone Encounter (Signed)
Pt stated that is fine and would like enough HCTZ to last her until then

## 2016-06-25 NOTE — Telephone Encounter (Signed)
Pt would like an afternoon appointment stated that it is hard for her to get off early. She would also like a refill for her HCTZ sent because she is out.

## 2016-06-25 NOTE — Telephone Encounter (Signed)
Pt will be in today

## 2016-06-25 NOTE — Addendum Note (Signed)
Addended by: Lolita Rieger D on: 06/25/2016 02:33 PM   Modules accepted: Orders

## 2016-06-25 NOTE — Addendum Note (Signed)
Addended by: Lolita Rieger D on: 06/25/2016 02:32 PM   Modules accepted: Orders

## 2016-06-25 NOTE — Addendum Note (Signed)
Addended by: Barnie Alderman L on: 06/25/2016 02:22 PM   Modules accepted: Orders

## 2016-06-25 NOTE — Addendum Note (Signed)
Addended by: Lolita Rieger D on: 06/25/2016 02:35 PM   Modules accepted: Orders

## 2016-06-26 ENCOUNTER — Other Ambulatory Visit
Admission: RE | Admit: 2016-06-26 | Discharge: 2016-06-26 | Disposition: A | Discharge status: Home / Self Care | Payer: 59 | Source: Ambulatory Visit | Attending: Family Medicine | Admitting: Family Medicine

## 2016-06-26 DIAGNOSIS — E538 Deficiency of other specified B group vitamins: Secondary | ICD-10-CM | POA: Diagnosis not present

## 2016-06-26 DIAGNOSIS — Z13220 Encounter for screening for lipoid disorders: Secondary | ICD-10-CM | POA: Diagnosis not present

## 2016-06-26 DIAGNOSIS — I10 Essential (primary) hypertension: Secondary | ICD-10-CM | POA: Diagnosis not present

## 2016-06-26 DIAGNOSIS — R739 Hyperglycemia, unspecified: Secondary | ICD-10-CM | POA: Insufficient documentation

## 2016-06-26 DIAGNOSIS — R609 Edema, unspecified: Secondary | ICD-10-CM | POA: Insufficient documentation

## 2016-06-26 LAB — CBC
HEMATOCRIT: 37.3 % (ref 35.0–47.0)
Hemoglobin: 12.5 g/dL (ref 12.0–16.0)
MCH: 27.4 pg (ref 26.0–34.0)
MCHC: 33.5 g/dL (ref 32.0–36.0)
MCV: 81.7 fL (ref 80.0–100.0)
Platelets: 294 10*3/uL (ref 150–440)
RBC: 4.57 MIL/uL (ref 3.80–5.20)
RDW: 15.2 % — AB (ref 11.5–14.5)
WBC: 6.4 10*3/uL (ref 3.6–11.0)

## 2016-06-26 LAB — LIPID PANEL
CHOL/HDL RATIO: 3.2 ratio
Cholesterol: 145 mg/dL (ref 0–200)
HDL: 46 mg/dL (ref >40)
LDL Cholesterol: 79 mg/dL (ref 0–99)
Triglycerides: 102 mg/dL (ref <150)
VLDL: 20 mg/dL (ref 0–40)

## 2016-06-26 LAB — COMPREHENSIVE METABOLIC PANEL
ALBUMIN: 4 g/dL (ref 3.5–5.0)
ALT: 17 U/L (ref 14–54)
AST: 18 U/L (ref 15–41)
Alkaline Phosphatase: 58 U/L (ref 38–126)
Anion gap: 8 (ref 5–15)
BILIRUBIN TOTAL: 0.8 mg/dL (ref 0.3–1.2)
BUN: 8 mg/dL (ref 6–20)
CHLORIDE: 109 mmol/L (ref 101–111)
CO2: 22 mmol/L (ref 22–32)
CREATININE: 0.89 mg/dL (ref 0.44–1.00)
Calcium: 9.2 mg/dL (ref 8.9–10.3)
GFR calc Af Amer: >60 mL/min (ref >60)
GLUCOSE: 108 mg/dL — AB (ref 65–99)
POTASSIUM: 3.4 mmol/L — AB (ref 3.5–5.1)
Sodium: 139 mmol/L (ref 135–145)
TOTAL PROTEIN: 7.7 g/dL (ref 6.5–8.1)

## 2016-06-26 LAB — VITAMIN B12: VITAMIN B 12: 237 pg/mL (ref 180–914)

## 2016-06-27 ENCOUNTER — Other Ambulatory Visit: Payer: Self-pay | Admitting: Family Medicine

## 2016-06-27 ENCOUNTER — Other Ambulatory Visit
Admission: RE | Admit: 2016-06-27 | Discharge: 2016-06-27 | Disposition: A | Discharge status: Home / Self Care | Payer: 59 | Source: Ambulatory Visit | Attending: Family Medicine | Admitting: Family Medicine

## 2016-06-27 DIAGNOSIS — R739 Hyperglycemia, unspecified: Secondary | ICD-10-CM

## 2016-06-27 DIAGNOSIS — I10 Essential (primary) hypertension: Secondary | ICD-10-CM | POA: Insufficient documentation

## 2016-06-28 LAB — HEMOGLOBIN A1C
HEMOGLOBIN A1C: 5.7 % — AB (ref 4.8–5.6)
Mean Plasma Glucose: 117 mg/dL

## 2016-07-09 DIAGNOSIS — G43109 Migraine with aura, not intractable, without status migrainosus: Secondary | ICD-10-CM | POA: Diagnosis not present

## 2016-07-25 ENCOUNTER — Other Ambulatory Visit: Payer: Self-pay

## 2016-08-06 ENCOUNTER — Ambulatory Visit: Payer: 59 | Admitting: Family Medicine

## 2016-08-23 ENCOUNTER — Other Ambulatory Visit: Payer: Self-pay

## 2016-08-23 MED ORDER — OSELTAMIVIR PHOSPHATE 75 MG PO CAPS: 75.0000 mg | ORAL_CAPSULE | Freq: Two times a day (BID) | ORAL | 0 refills | Status: AC

## 2016-10-10 DIAGNOSIS — G4733 Obstructive sleep apnea (adult) (pediatric): Secondary | ICD-10-CM | POA: Diagnosis not present

## 2016-10-10 DIAGNOSIS — Z9989 Dependence on other enabling machines and devices: Secondary | ICD-10-CM | POA: Diagnosis not present

## 2016-10-10 DIAGNOSIS — E669 Obesity, unspecified: Secondary | ICD-10-CM | POA: Diagnosis not present

## 2016-10-10 DIAGNOSIS — G43109 Migraine with aura, not intractable, without status migrainosus: Secondary | ICD-10-CM | POA: Diagnosis not present

## 2016-10-24 DIAGNOSIS — E669 Obesity, unspecified: Secondary | ICD-10-CM | POA: Diagnosis not present

## 2016-10-24 DIAGNOSIS — G4733 Obstructive sleep apnea (adult) (pediatric): Secondary | ICD-10-CM | POA: Diagnosis not present

## 2016-10-24 DIAGNOSIS — G43109 Migraine with aura, not intractable, without status migrainosus: Secondary | ICD-10-CM | POA: Diagnosis not present

## 2016-10-24 DIAGNOSIS — R51 Headache: Secondary | ICD-10-CM | POA: Diagnosis not present

## 2016-10-29 ENCOUNTER — Other Ambulatory Visit: Payer: Self-pay | Admitting: Nurse Practitioner

## 2016-10-29 DIAGNOSIS — G43109 Migraine with aura, not intractable, without status migrainosus: Secondary | ICD-10-CM

## 2016-11-06 ENCOUNTER — Other Ambulatory Visit: Payer: Self-pay | Admitting: Family Medicine

## 2016-11-06 DIAGNOSIS — Z1231 Encounter for screening mammogram for malignant neoplasm of breast: Secondary | ICD-10-CM

## 2016-11-08 ENCOUNTER — Ambulatory Visit
Admission: RE | Admit: 2016-11-08 | Discharge: 2016-11-08 | Disposition: A | Discharge status: Home / Self Care | Payer: 59 | Source: Ambulatory Visit | Attending: Nurse Practitioner | Admitting: Nurse Practitioner

## 2016-11-08 DIAGNOSIS — R9082 White matter disease, unspecified: Secondary | ICD-10-CM | POA: Insufficient documentation

## 2016-11-08 DIAGNOSIS — G43109 Migraine with aura, not intractable, without status migrainosus: Secondary | ICD-10-CM | POA: Diagnosis not present

## 2016-11-08 DIAGNOSIS — R42 Dizziness and giddiness: Secondary | ICD-10-CM | POA: Diagnosis not present

## 2016-11-08 MED ORDER — GADOBENATE DIMEGLUMINE 529 MG/ML IV SOLN
20.0000 mL | Freq: Once | INTRAVENOUS | Status: AC | PRN
  Administered 2016-11-08: 20 mL via INTRAVENOUS

## 2016-11-19 DIAGNOSIS — G43719 Chronic migraine without aura, intractable, without status migrainosus: Secondary | ICD-10-CM | POA: Diagnosis not present

## 2016-12-04 ENCOUNTER — Ambulatory Visit
Admission: RE | Admit: 2016-12-04 | Discharge: 2016-12-04 | Disposition: A | Discharge status: Home / Self Care | Payer: 59 | Source: Ambulatory Visit | Attending: Family Medicine | Admitting: Family Medicine

## 2016-12-04 DIAGNOSIS — Z1231 Encounter for screening mammogram for malignant neoplasm of breast: Secondary | ICD-10-CM | POA: Diagnosis not present

## 2016-12-18 ENCOUNTER — Ambulatory Visit: Payer: 59 | Admitting: Family Medicine

## 2017-02-02 DIAGNOSIS — G4733 Obstructive sleep apnea (adult) (pediatric): Secondary | ICD-10-CM | POA: Diagnosis not present

## 2017-02-11 ENCOUNTER — Encounter: Payer: Self-pay | Admitting: Family Medicine

## 2017-02-11 ENCOUNTER — Ambulatory Visit (INDEPENDENT_AMBULATORY_CARE_PROVIDER_SITE_OTHER): Payer: 59 | Admitting: Family Medicine

## 2017-02-11 VITALS — BP 98/70 | HR 96 | Temp 97.1°F | Resp 16 | Ht 62.0 in | Wt 217.1 lb

## 2017-02-11 DIAGNOSIS — G43809 Other migraine, not intractable, without status migrainosus: Secondary | ICD-10-CM

## 2017-02-11 DIAGNOSIS — E8881 Metabolic syndrome: Secondary | ICD-10-CM | POA: Diagnosis not present

## 2017-02-11 DIAGNOSIS — G4733 Obstructive sleep apnea (adult) (pediatric): Secondary | ICD-10-CM

## 2017-02-11 DIAGNOSIS — M25512 Pain in left shoulder: Secondary | ICD-10-CM

## 2017-02-11 DIAGNOSIS — G43109 Migraine with aura, not intractable, without status migrainosus: Secondary | ICD-10-CM

## 2017-02-11 DIAGNOSIS — E785 Hyperlipidemia, unspecified: Secondary | ICD-10-CM | POA: Diagnosis not present

## 2017-02-11 DIAGNOSIS — R739 Hyperglycemia, unspecified: Secondary | ICD-10-CM | POA: Diagnosis not present

## 2017-02-11 DIAGNOSIS — F411 Generalized anxiety disorder: Secondary | ICD-10-CM | POA: Diagnosis not present

## 2017-02-11 DIAGNOSIS — Z9989 Dependence on other enabling machines and devices: Secondary | ICD-10-CM | POA: Diagnosis not present

## 2017-02-11 DIAGNOSIS — I10 Essential (primary) hypertension: Secondary | ICD-10-CM

## 2017-02-11 DIAGNOSIS — E538 Deficiency of other specified B group vitamins: Secondary | ICD-10-CM | POA: Diagnosis not present

## 2017-02-11 DIAGNOSIS — G4700 Insomnia, unspecified: Secondary | ICD-10-CM | POA: Diagnosis not present

## 2017-02-11 MED ORDER — TRAZODONE HCL 50 MG PO TABS: 25.0000 mg | ORAL_TABLET | Freq: Every evening | ORAL | 0 refills | Status: DC | PRN

## 2017-02-11 MED ORDER — METFORMIN HCL ER 500 MG PO TB24: 500.0000 mg | ORAL_TABLET | Freq: Every day | ORAL | 0 refills | Status: DC

## 2017-02-11 NOTE — Progress Notes (Signed)
Name: Marie Vaughan   MRN: [id number]    DOB: April 23, 1971   Date:02/11/2017       Progress Note  Subjective  Chief Complaint  Chief Complaint  Patient presents with  . Follow-up  . Headache    Seen Dr. Manuella Ghazi, Neurologist. Still having headaches, dizziness, lightheaded  . Hypotension    Has been getting 80/40's at home and has fallen twice in the past month and gets dizzy and stumbles around.     HPI  HTN: she has not been taking HCTZ for over two  months She denies chest pain, or palpitation. BP has been low and she has tachycardia right now, advised to stay hydrated and if remains low , refer to nephrologist  Migraine/vestibular: She is okay on Topamax 100 mg daily, seeing Dr. Manuella Ghazi. Given Imitrex but gets very groggy on it. Last severe episode was 3 weeks ago, she was in her kitchen, felt dizzy, and sat down, she states that shortly after the dizziness she developed pulsating migraine, she sat down for a while. .   OSA: moderate to severe based on sleep study, started on CPAP at 9 cm H2O July 2017, her mask broke and she just got a replacement this past week, but her hair does not allow her to wear the mask, she will change her hair style  Insomnia: she has been out of alprazolam, but more stressed, son is back at home, he will go to [facility] this Fall. Goes to bed late , playing games , discussed sleep hygiene and try short term Trazodone only   Metabolic syndrome: she denies polyphagia, polyuria or polydipsia, last HDL 46, history of HTN and increase in abdominal girth.   Left shoulder pain: she had episode of migraine preceded by dizziness and caught herself with left arm, since than has pain with abduction and internal rotation or when sleeping on left side  Obesity: she is trying to eat healthy, wants to go back on Phentermine but advised to try Metformin first and consider GLP-1 agonist   Patient Active Problem List   Diagnosis Date Noted  . Metabolic syndrome 63/81/7711  . OSA on  CPAP 02/03/2016  . 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 Abnormal brain MRI 12/20/2015  . Dizziness 12/13/2015  . Near syncope 12/10/2015  . Atypical chest pain 08/22/2013  . Essential hypertension 08/22/2013  . Angio-edema   . GERD (gastroesophageal reflux disease)   . Migraine with aura and without status migrainosus   . Acne   . Anxiety   . Allergic rhinitis 10/05/2011  . Insomnia 08/30/2011  . Obesity 08/30/2011    Past Surgical History:  Procedure Laterality Date  . ABDOMINAL HYSTERECTOMY  04-25-10   Due to abnormal PAP  . CARDIAC CATHETERIZATION  04/2014   [facility]  . DILATION AND CURETTAGE OF UTERUS    . KNEE ARTHROSCOPY Left 06/10/2013   Procedure: LEFT KNEE ARTHROSCOPY KNEE WITH PARTIAL MEDIAL MENISCECTOMY ;  Surgeon: Lorn Junes, MD;  Location: [place];  Service: Orthopedics;  Laterality: Left;  xerofoam, 4x4's, webril, ace wrap, ice wrap  . KNEE ARTHROSCOPY WITH MEDIAL MENISECTOMY Right 06/10/2013   Procedure: RIGHT KNEE ARTHROSCOPY WITH MEDIAL MENISECTOMY;  Surgeon: Lorn Junes, MD;  Location: [place];  Service: Orthopedics;  Laterality: Right;  partial lateral menisectomoy and chondroplasty  . OVARIAN CYST REMOVAL  04-25-10    Family History  Problem Relation Age of Onset  . Early death Mother  Murdered when pt was 70 years old  . Diabetes Mother   . Hypertension Mother   . Gout Mother   . Kidney failure Mother   . Early death Sister 71       due to hemorage  . Alcohol abuse Maternal Uncle   . Alcohol abuse Other   . Arthritis Other   . Heart disease Other   . Other Other        cousin- phlebitis  . Diabetes Maternal Aunt   . Hyperlipidemia Maternal Aunt   . Hypertension Maternal Aunt   . Prostate cancer Maternal Uncle   . Breast cancer Neg Hx     Social History   Social History  . Marital status: Married    Spouse name: N/A  . Number of children: 3  . Years of education: N/A   Occupational History  .  Health information manager  [employer]   Social History Main Topics  . Smoking status: Never Smoker  . Smokeless tobacco: Never Used  . Alcohol use No  . Drug use: No  . Sexual activity: Yes    Partners: Male   Other Topics Concern  . Not on file   Social History Narrative   Lives in [place] with husband, two children gone . Daughter's are grown and out of the house. Her 47 yo is going to college                 Current Outpatient Prescriptions:  .  aspirin EC 81 MG tablet, Take 1 tablet (81 mg total) by mouth daily., Disp: 90 tablet, Rfl: 3 .  Cyanocobalamin (B-12) 1000 MCG SUBL, Place 1 tablet under the tongue daily., Disp: 30 each, Rfl: 0 .  esomeprazole (NEXIUM) 40 MG capsule, Take 1 capsule (40 mg total) by mouth daily before breakfast., Disp: , Rfl:  .  SUMAtriptan (IMITREX) 50 MG tablet, Take by mouth., Disp: , Rfl:  .  topiramate (TOPAMAX) 100 MG tablet, Take by mouth., Disp: , Rfl:  .  Elastic Bandages & Supports (MEDICAL COMPRESSION STOCKINGS) MISC, 2 Units by Does not apply route daily. (Patient not taking: Reported on 02/11/2017), Disp: 2 each, Rfl: 0 .  metFORMIN (GLUCOPHAGE-XR) 500 MG 24 hr tablet, Take 1 tablet (500 mg total) by mouth daily with breakfast., Disp: 30 tablet, Rfl: 0 .  traZODone (DESYREL) 50 MG tablet, Take 0.5-1 tablets (25-50 mg total) by mouth at bedtime as needed for sleep., Disp: 30 tablet, Rfl: 0  Allergies  Allergen Reactions  . Chocolate     Hives, itching  . Iodinated Diagnostic Agents   . Iohexol     CP and SOB  . Peanut-Containing Drug Products Swelling    cashews     ROS  Constitutional: Negative for fever, positive for mild  weight change.  Respiratory: Negative for cough and shortness of breath.   Cardiovascular: Negative for chest pain or palpitations.  Gastrointestinal: Negative for abdominal pain, no bowel changes.  Musculoskeletal: Negative for gait problem or joint swelling.  Skin: Negative for rash.  Neurological:  Positivefor dizziness and headache.  No other specific complaints in a complete review of systems (except as listed in HPI above).  Objective  Vitals:   02/11/17 1343  BP: (!) 86/48  Pulse: (!) 110  Resp: 16  Temp: (!) 97.1 F (36.2 C)  TempSrc: Oral  SpO2: 98%  Weight: 217 lb 1.6 oz (98.5 kg)  Height: 5\' 2"  (1.575 m)    Body mass index is 39.71  kg/m.  Physical Exam  Constitutional: Patient appears well-developed and well-nourished. Obese  No distress.  HEENT: head atraumatic, normocephalic, pupils equal and reactive to light,  neck supple, throat within normal limits Cardiovascular: Normal rate, regular rhythm and normal heart sounds.  No murmur heard. No BLE edema. Pulmonary/Chest: Effort normal and breath sounds normal. No respiratory distress. Abdominal: Soft.  There is no tenderness. Psychiatric: Patient has a normal mood and affect. behavior is normal. Judgment and thought content normal. Muscular Skeletal: positive impingement sign on left, pain with abduction and internal rotation   PHQ2/9: Depression screen [facility] 2/9 02/11/2017 03/05/2016 12/05/2015 04/20/2015  Decreased Interest 0 0 0 0  Down, Depressed, Hopeless 0 0 0 0  PHQ - 2 Score 0 0 0 0     Fall Risk: Fall Risk  02/11/2017 03/05/2016 12/05/2015 04/20/2015 03/10/2015  Falls in the past year? Yes No No No No  Number falls in past yr: 2 or more - - - -  Injury with Fall? No - - - -  Risk Factor Category  High Fall Risk - - - -    Functional Status Survey: Is the patient deaf or have difficulty hearing?: No Does the patient have difficulty seeing, even when wearing glasses/contacts?: No Does the patient have difficulty concentrating, remembering, or making decisions?: No Does the patient have difficulty walking or climbing stairs?: No Does the patient have difficulty dressing or bathing?: No Does the patient have difficulty doing errands alone such as visiting a doctor's office or shopping?: No   Assessment &  Plan  1. Essential hypertension  bp is low, she is drinking at least 64 ounces of water daily, off HCTZ for months, do not resume, monitor bp at work - COMPLETE METABOLIC PANEL WITH GFR  2. OSA on CPAP  Needs to resume CPAP , she will change her hair style 3. Vestibular migraine  Continue follow up with Dr. Manuella Ghazi, advised her to contact his office and explained that Imitrex 100 mg seems too strong  4. Hyperglycemia  - hgbA1C  - fasting insulin   5. B12 deficiency  - Vitamin B12  6. GAD (generalized anxiety disorder)  She is out of alprazolam, we will hold off on resuming medication now, discussed mindfulness.   7. Metabolic syndrome  Discussed therapy, we will try Glucophage, she denies family history of MEN or thyroid cancer or personal history of pancreatitis - metFORMIN (GLUCOPHAGE-XR) 500 MG 24 hr tablet; Take 1 tablet (500 mg total) by mouth daily with breakfast.  Dispense: 30 tablet; Refill: 0 - Hemoglobin A1c - Insulin, fasting  8. Insomnia, unspecified type  - traZODone (DESYREL) 50 MG tablet; Take 0.5-1 tablets (25-50 mg total) by mouth at bedtime as needed for sleep.  Dispense: 30 tablet; Refill: 0  9. Dyslipidemia  - Lipid panel  10. Acute pain of left shoulder  We will try home exercise for now, advised topical medication

## 2017-02-11 NOTE — Patient Instructions (Signed)

## 2017-02-12 ENCOUNTER — Other Ambulatory Visit
Admission: RE | Admit: 2017-02-12 | Discharge: 2017-02-12 | Disposition: A | Discharge status: Home / Self Care | Payer: 59 | Source: Ambulatory Visit | Attending: Family Medicine | Admitting: Family Medicine

## 2017-02-12 DIAGNOSIS — E8881 Metabolic syndrome: Secondary | ICD-10-CM | POA: Insufficient documentation

## 2017-02-12 DIAGNOSIS — E785 Hyperlipidemia, unspecified: Secondary | ICD-10-CM | POA: Diagnosis not present

## 2017-02-12 DIAGNOSIS — E538 Deficiency of other specified B group vitamins: Secondary | ICD-10-CM | POA: Diagnosis not present

## 2017-02-12 DIAGNOSIS — I10 Essential (primary) hypertension: Secondary | ICD-10-CM | POA: Diagnosis not present

## 2017-02-12 LAB — COMPREHENSIVE METABOLIC PANEL
ALBUMIN: 4.1 g/dL (ref 3.5–5.0)
ALT: 22 U/L (ref 14–54)
ANION GAP: 12 (ref 5–15)
AST: 23 U/L (ref 15–41)
Alkaline Phosphatase: 87 U/L (ref 38–126)
BILIRUBIN TOTAL: 0.8 mg/dL (ref 0.3–1.2)
BUN: 17 mg/dL (ref 6–20)
CO2: 23 mmol/L (ref 22–32)
Calcium: 9.7 mg/dL (ref 8.9–10.3)
Chloride: 100 mmol/L — ABNORMAL LOW (ref 101–111)
Creatinine, Ser: 0.97 mg/dL (ref 0.44–1.00)
GFR calc Af Amer: >60 mL/min (ref >60)
GLUCOSE: 127 mg/dL — AB (ref 65–99)
POTASSIUM: 3.2 mmol/L — AB (ref 3.5–5.1)
Sodium: 135 mmol/L (ref 135–145)
TOTAL PROTEIN: 7.7 g/dL (ref 6.5–8.1)

## 2017-02-12 LAB — LIPID PANEL
CHOL/HDL RATIO: 3.8 ratio
CHOLESTEROL: 155 mg/dL (ref 0–200)
HDL: 41 mg/dL (ref >40)
LDL Cholesterol: 90 mg/dL (ref 0–99)
Triglycerides: 118 mg/dL (ref <150)
VLDL: 24 mg/dL (ref 0–40)

## 2017-02-12 LAB — VITAMIN B12: Vitamin B-12: 310 pg/mL (ref 180–914)

## 2017-02-13 LAB — HEMOGLOBIN A1C
Hgb A1c MFr Bld: 5.9 % — ABNORMAL HIGH (ref 4.8–5.6)
MEAN PLASMA GLUCOSE: 123 mg/dL

## 2017-02-13 LAB — INSULIN, RANDOM: Insulin: 51.3 u[IU]/mL — ABNORMAL HIGH (ref 2.6–24.9)

## 2017-02-15 ENCOUNTER — Telehealth: Payer: Self-pay

## 2017-02-15 NOTE — Telephone Encounter (Signed)
Patient was calling to check on her lab results. Please result and I will contact the patient.

## 2017-03-04 ENCOUNTER — Telehealth: Payer: Self-pay | Admitting: Family Medicine

## 2017-03-04 NOTE — Telephone Encounter (Signed)
Patient wanted to know if she still needs to speak with Dr. Ancil Boozer because she had headaches last night. Her husband stated that she was jumpy and  shaky even while wearing the CPAP machine.  Then Dr. Ancil Boozer responded that patient needs to contact Dr. Manuella Ghazi the neurologist because she may not be getting enough flow through her CPAP machine.  Dr. Ancil Boozer also said she needs to take 1000 mcg sublingual B12 daily and it is up to patient if she would like to start doing the insulin injections.

## 2017-03-16 ENCOUNTER — Encounter: Payer: Self-pay | Admitting: Gynecology

## 2017-03-16 ENCOUNTER — Emergency Department: Payer: 59

## 2017-03-16 ENCOUNTER — Encounter: Payer: Self-pay | Admitting: Emergency Medicine

## 2017-03-16 ENCOUNTER — Ambulatory Visit (INDEPENDENT_AMBULATORY_CARE_PROVIDER_SITE_OTHER): Payer: 59

## 2017-03-16 ENCOUNTER — Ambulatory Visit
Admission: EM | Admit: 2017-03-16 | Discharge: 2017-03-16 | Disposition: A | Discharge status: Home / Self Care | Payer: 59 | Source: Home / Self Care | Attending: Family Medicine | Admitting: Family Medicine

## 2017-03-16 ENCOUNTER — Emergency Department
Admission: EM | Admit: 2017-03-16 | Discharge: 2017-03-16 | Disposition: A | Discharge status: Home / Self Care | Payer: 59 | Attending: Emergency Medicine | Admitting: Emergency Medicine

## 2017-03-16 DIAGNOSIS — Z7982 Long term (current) use of aspirin: Secondary | ICD-10-CM | POA: Diagnosis not present

## 2017-03-16 DIAGNOSIS — R109 Unspecified abdominal pain: Secondary | ICD-10-CM

## 2017-03-16 DIAGNOSIS — K760 Fatty (change of) liver, not elsewhere classified: Secondary | ICD-10-CM | POA: Diagnosis not present

## 2017-03-16 DIAGNOSIS — I10 Essential (primary) hypertension: Secondary | ICD-10-CM | POA: Insufficient documentation

## 2017-03-16 DIAGNOSIS — Z79899 Other long term (current) drug therapy: Secondary | ICD-10-CM | POA: Diagnosis not present

## 2017-03-16 DIAGNOSIS — R1011 Right upper quadrant pain: Secondary | ICD-10-CM

## 2017-03-16 DIAGNOSIS — Z91010 Allergy to peanuts: Secondary | ICD-10-CM | POA: Diagnosis not present

## 2017-03-16 DIAGNOSIS — Z7984 Long term (current) use of oral hypoglycemic drugs: Secondary | ICD-10-CM | POA: Insufficient documentation

## 2017-03-16 LAB — COMPREHENSIVE METABOLIC PANEL
ALK PHOS: 92 U/L (ref 38–126)
ALT: 21 U/L (ref 14–54)
ANION GAP: 9 (ref 5–15)
AST: 22 U/L (ref 15–41)
Albumin: 4.3 g/dL (ref 3.5–5.0)
BILIRUBIN TOTAL: 0.5 mg/dL (ref 0.3–1.2)
BUN: 7 mg/dL (ref 6–20)
CALCIUM: 9.4 mg/dL (ref 8.9–10.3)
CO2: 25 mmol/L (ref 22–32)
CREATININE: 0.84 mg/dL (ref 0.44–1.00)
Chloride: 104 mmol/L (ref 101–111)
GFR calc Af Amer: >60 mL/min (ref >60)
GFR calc non Af Amer: >60 mL/min (ref >60)
GLUCOSE: 112 mg/dL — AB (ref 65–99)
Potassium: 3.6 mmol/L (ref 3.5–5.1)
Sodium: 138 mmol/L (ref 135–145)
TOTAL PROTEIN: 7.9 g/dL (ref 6.5–8.1)

## 2017-03-16 LAB — URINALYSIS, COMPLETE (UACMP) WITH MICROSCOPIC
Bilirubin Urine: NEGATIVE
GLUCOSE, UA: NEGATIVE mg/dL
Ketones, ur: NEGATIVE mg/dL
Leukocytes, UA: NEGATIVE
NITRITE: NEGATIVE
PROTEIN: NEGATIVE mg/dL
SPECIFIC GRAVITY, URINE: 1.02 (ref 1.005–1.030)
pH: 6.5 (ref 5.0–8.0)

## 2017-03-16 LAB — CBC WITH DIFFERENTIAL/PLATELET
BASOS ABS: 0 10*3/uL (ref 0–0.1)
Basophils Relative: 1 %
Eosinophils Absolute: 0.1 10*3/uL (ref 0–0.7)
Eosinophils Relative: 1 %
HEMATOCRIT: 36.3 % (ref 35.0–47.0)
Hemoglobin: 12 g/dL (ref 12.0–16.0)
LYMPHS ABS: 1.8 10*3/uL (ref 1.0–3.6)
LYMPHS PCT: 31 %
MCH: 26.9 pg (ref 26.0–34.0)
MCHC: 33.1 g/dL (ref 32.0–36.0)
MCV: 81.2 fL (ref 80.0–100.0)
MONO ABS: 0.3 10*3/uL (ref 0.2–0.9)
Monocytes Relative: 6 %
NEUTROS ABS: 3.5 10*3/uL (ref 1.4–6.5)
Neutrophils Relative %: 61 %
Platelets: 310 10*3/uL (ref 150–440)
RBC: 4.47 MIL/uL (ref 3.80–5.20)
RDW: 14.7 % — ABNORMAL HIGH (ref 11.5–14.5)
WBC: 5.8 10*3/uL (ref 3.6–11.0)

## 2017-03-16 MED ORDER — BARIUM SULFATE 2.1 % PO SUSP
450.0000 mL | ORAL | Status: AC
  Administered 2017-03-16 (×2): 450 mL via ORAL

## 2017-03-16 MED ORDER — TRAMADOL HCL 50 MG PO TABS: 50.0000 mg | ORAL_TABLET | Freq: Four times a day (QID) | ORAL | 0 refills | Status: DC | PRN

## 2017-03-16 MED ORDER — ONDANSETRON 8 MG PO TBDP
8.0000 mg | ORAL_TABLET | Freq: Once | ORAL | Status: AC
  Administered 2017-03-16: 8 mg via ORAL

## 2017-03-16 NOTE — ED Triage Notes (Signed)
Pt to ed with c/o right side abd pain since yesterday.  Was seen at [facility] and sent here for eval.

## 2017-03-16 NOTE — ED Provider Notes (Signed)
[facility] Emergency Department Provider Note   ____________________________________________   I have reviewed the triage vital signs and the nursing notes.   HISTORY  Chief Complaint Abdominal Pain   History limited by: Not Limited   HPI Marie Vaughan is a 46 y.o. female who presents to the emergency department today because of concern for abdominal pain. The patient states that the pain started yesterday. It is located in the right flank. It radiates around to the abdomen and right groin. She describes it as burning. It has been constant since the pain started. It was accompanied by nausea. She thinks she might have had a fever last night because she woke up in sweat although it was not measured. Denies similar symptoms in the past.    Past Medical History:  Diagnosis Date  . Acne   . Acute bronchitis   . Angio-edema   . Anxiety   . Anxiety state, unspecified   . Cervicalgia   . Contact lens/glasses fitting    wears contacts or glasses  . Dysmenorrhea   . GERD (gastroesophageal reflux disease)   . History of cardiac cath    a. 04/29/2014: LM nl, LAD no dz, D1 no dz, D2 very small vessel, D3 very small vessel, LCx nl, OM1 very small vessel, OM2 medium sized vessel, OM 3 no dz, RCA no dz, PDA no dz   . History of stress test    a. Lexiscan 08/25/13: no sig ischemia, attenuation artifact noted, no sig WMA noted, EF 69%, no EKG changes concerning for ischemia  . HTN (hypertension)   . Iron deficiency anemia secondary to blood loss (chronic)   . Migraine with aura, without mention of intractable migraine without mention of status migrainosus   . Obesity   . 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 Pain in thoracic spine   . Pharyngitis   . Tear of medial meniscus of left knee   . Tear of medial meniscus of right knee   . Vaginal delivery    x 3    Patient Active Problem List   Diagnosis Date Noted  . Metabolic syndrome 00/93/8182  . OSA  on CPAP 02/03/2016  . 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 Abnormal brain MRI 12/20/2015  . Dizziness 12/13/2015  . Near syncope 12/10/2015  . Atypical chest pain 08/22/2013  . Essential hypertension 08/22/2013  . Angio-edema   . GERD (gastroesophageal reflux disease)   . Migraine with aura and without status migrainosus   . Acne   . Anxiety   . Allergic rhinitis 10/05/2011  . Insomnia 08/30/2011  . Obesity 08/30/2011    Past Surgical History:  Procedure Laterality Date  . ABDOMINAL HYSTERECTOMY  04-25-10   Due to abnormal PAP  . CARDIAC CATHETERIZATION  04/2014   [facility]  . DILATION AND CURETTAGE OF UTERUS    . KNEE ARTHROSCOPY Left 06/10/2013   Procedure: LEFT KNEE ARTHROSCOPY KNEE WITH PARTIAL MEDIAL MENISCECTOMY ;  Surgeon: Lorn Junes, MD;  Location: [place];  Service: Orthopedics;  Laterality: Left;  xerofoam, 4x4's, webril, ace wrap, ice wrap  . KNEE ARTHROSCOPY WITH MEDIAL MENISECTOMY Right 06/10/2013   Procedure: RIGHT KNEE ARTHROSCOPY WITH MEDIAL MENISECTOMY;  Surgeon: Lorn Junes, MD;  Location: [place];  Service: Orthopedics;  Laterality: Right;  partial lateral menisectomoy and chondroplasty  . OVARIAN CYST REMOVAL  04-25-10    Prior to Admission medications   Medication Sig Start Date End Date Taking?  Authorizing Provider  aspirin EC 81 MG tablet Take 1 tablet (81 mg total) by mouth daily. 04/21/14   Dunn, Areta Haber, PA-C  Cyanocobalamin (B-12) 1000 MCG SUBL Place 1 tablet under the tongue daily. 06/25/16   Steele Sizer, MD  Elastic Bandages & Supports (MEDICAL COMPRESSION STOCKINGS) MISC 2 Units by Does not apply route daily. 12/05/15   Steele Sizer, MD  esomeprazole (NEXIUM) 40 MG capsule Take 1 capsule (40 mg total) by mouth daily before breakfast. 01/05/13   Tanda Rockers, MD  metFORMIN (GLUCOPHAGE-XR) 500 MG 24 hr tablet Take 1 tablet (500 mg total) by mouth daily with breakfast. 02/11/17   Steele Sizer, MD  SUMAtriptan  (IMITREX) 50 MG tablet Take by mouth. 10/10/16   [provider]  topiramate (TOPAMAX) 100 MG tablet Take by mouth. 10/10/16 04/08/17  [provider]  traZODone (DESYREL) 50 MG tablet Take 0.5-1 tablets (25-50 mg total) by mouth at bedtime as needed for sleep. 02/11/17   Steele Sizer, MD    Allergies Chocolate; Iodinated diagnostic agents; Iohexol; and Peanut-containing drug products  Family History  Problem Relation Age of Onset  . Early death Mother        Murdered when pt was 48 years old  . Diabetes Mother   . Hypertension Mother   . Gout Mother   . Kidney failure Mother   . Early death Sister 40       due to hemorage  . Alcohol abuse Maternal Uncle   . Alcohol abuse Other   . Arthritis Other   . Heart disease Other   . Other Other        cousin- phlebitis  . Diabetes Maternal Aunt   . Hyperlipidemia Maternal Aunt   . Hypertension Maternal Aunt   . Prostate cancer Maternal Uncle   . Breast cancer Neg Hx     Social History Social History  Substance Use Topics  . Smoking status: Never Smoker  . Smokeless tobacco: Never Used  . Alcohol use No    Review of Systems Constitutional: No fever/chills Eyes: No visual changes. ENT: No sore throat. Cardiovascular: Denies chest pain. Respiratory: Denies shortness of breath. Gastrointestinal: Positive for abdominal pain.  Genitourinary: Negative for dysuria. Musculoskeletal: Negative for back pain. Skin: Negative for rash. Neurological: Negative for headaches, focal weakness or numbness.  ____________________________________________   PHYSICAL EXAM:  VITAL SIGNS: ED Triage Vitals  Enc Vitals Group     BP --      Pulse Rate 03/16/17 1204 77     Resp 03/16/17 1204 18     Temp --      Temp Source 03/16/17 1204 Oral     SpO2 03/16/17 1204 100 %     Weight 03/16/17 1205 217 lb (98.4 kg)     Height --      Head Circumference --      Peak Flow --      Pain Score 03/16/17 1204 10   Constitutional:  Alert and oriented. Well appearing and in no distress. Eyes: Conjunctivae are normal.  ENT   Head: Normocephalic and atraumatic.   Nose: No congestion/rhinnorhea.   Mouth/Throat: Mucous membranes are moist.   Neck: No stridor. Hematological/Lymphatic/Immunilogical: No cervical lymphadenopathy. Cardiovascular: Normal rate, regular rhythm.  No murmurs, rubs, or gallops.  Respiratory: Normal respiratory effort without tachypnea nor retractions. Breath sounds are clear and equal bilaterally. No wheezes/rales/rhonchi. Gastrointestinal: Soft and tender to palpation over the right abdomen.  Genitourinary: Deferred Musculoskeletal: Normal range of motion  in all extremities. No lower extremity edema. Neurologic:  Normal speech and language. No gross focal neurologic deficits are appreciated.  Skin:  Skin is warm, dry and intact. No rash noted. Psychiatric: Mood and affect are normal. Speech and behavior are normal. Patient exhibits appropriate insight and judgment.  ____________________________________________    LABS (pertinent positives/negatives)  None  ____________________________________________   EKG  None  ____________________________________________    RADIOLOGY  CT abd/pel   IMPRESSION:  No evidence of bowel obstruction. Normal appendix.    Mild hepatic steatosis.    No CT findings to account for the patient's right abdominal pain.    [place] RUQ IMPRESSION: 1. Tender gallbladder region but no visible stone or inflammation. 2. Hepatic steatosis.   ____________________________________________   PROCEDURES  Procedures  ____________________________________________   INITIAL IMPRESSION / ASSESSMENT AND PLAN / ED COURSE  Pertinent labs & imaging results that were available during my care of the patient were reviewed by me and considered in my medical decision making (see chart for details).  Patient from urgent e because of concerns for  abdominal pain. CT scan and ultrasound of right upper quadrant negative for acute process. This point I wonder if patient has shingles and the pre-rash stage. Did discuss with patient importance of follow-up. Additionally discussed return precautions. Did check [facility] drug database prior to tramadol prescription.  ____________________________________________   FINAL CLINICAL IMPRESSION(S) / ED DIAGNOSES  Final diagnoses:  Abdominal pain  Abdominal pain, unspecified abdominal location     Note: This dictation was prepared with Dragon dictation. Any transcriptional errors that result from this process are unintentional     Nance Pear, MD 03/16/17 2029

## 2017-03-16 NOTE — Discharge Instructions (Signed)
Recommend patient go to Emergency Department for further evaluation

## 2017-03-16 NOTE — ED Notes (Signed)
Patient transported to Ultrasound 

## 2017-03-16 NOTE — ED Notes (Signed)
Pt deferred IV until after seeing MD

## 2017-03-16 NOTE — ED Notes (Signed)
Family came out asking how much longer it would be for patient to get her [place], I called and verified patient was next and they told me it would be about 5-10 minutes before they come to get her.  Family updated.

## 2017-03-16 NOTE — ED Notes (Addendum)
Patient transported to CT 

## 2017-03-16 NOTE — ED Notes (Signed)
Patient has completed Barium, CT notified

## 2017-03-16 NOTE — ED Triage Notes (Addendum)
Per patient right side pain x yesterday. Per patient also stated has dizzy spell but seeing Dr. Manuella Ghazi for that problem.                        Has

## 2017-03-16 NOTE — Discharge Instructions (Signed)
Please seek medical attention for any high fevers, chest pain, shortness of breath, change in behavior, persistent vomiting, bloody stool or any other new or concerning symptoms.  

## 2017-03-16 NOTE — ED Provider Notes (Signed)
[facility]    CSN: [id number] Arrival date & time: 03/16/17  0816     History   Chief Complaint Chief Complaint  Patient presents with  . abdomen pain    HPI Marie Vaughan is a 46 y.o. female.   46 yo female with a c/o right sided (flank) abdominal radiating towards the groin,  progressively worsening since yesterday, and associated with nausea. Denies any fevers, chills, chest pains, shortness of breath.    The history is provided by the patient.    Past Medical History:  Diagnosis Date  . Acne   . Acute bronchitis   . Angio-edema   . Anxiety   . Anxiety state, unspecified   . Cervicalgia   . Contact lens/glasses fitting    wears contacts or glasses  . Dysmenorrhea   . GERD (gastroesophageal reflux disease)   . History of cardiac cath    a. 04/29/2014: LM nl, LAD no dz, D1 no dz, D2 very small vessel, D3 very small vessel, LCx nl, OM1 very small vessel, OM2 medium sized vessel, OM 3 no dz, RCA no dz, PDA no dz   . History of stress test    a. Lexiscan 08/25/13: no sig ischemia, attenuation artifact noted, no sig WMA noted, EF 69%, no EKG changes concerning for ischemia  . HTN (hypertension)   . Iron deficiency anemia secondary to blood loss (chronic)   . Migraine with aura, without mention of intractable migraine without mention of status migrainosus   . Obesity   . 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 Pain in thoracic spine   . Pharyngitis   . Tear of medial meniscus of left knee   . Tear of medial meniscus of right knee   . Vaginal delivery    x 3    Patient Active Problem List   Diagnosis Date Noted  . Metabolic syndrome 85/88/5027  . OSA on CPAP 02/03/2016  . 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 Abnormal brain MRI 12/20/2015  . Dizziness 12/13/2015  . Near syncope 12/10/2015  . Atypical chest pain 08/22/2013  . Essential hypertension 08/22/2013  . Angio-edema   . GERD (gastroesophageal reflux disease)   . Migraine with aura and  without status migrainosus   . Acne   . Anxiety   . Allergic rhinitis 10/05/2011  . Insomnia 08/30/2011  . Obesity 08/30/2011    Past Surgical History:  Procedure Laterality Date  . ABDOMINAL HYSTERECTOMY  04-25-10   Due to abnormal PAP  . CARDIAC CATHETERIZATION  04/2014   [facility]  . DILATION AND CURETTAGE OF UTERUS    . KNEE ARTHROSCOPY Left 06/10/2013   Procedure: LEFT KNEE ARTHROSCOPY KNEE WITH PARTIAL MEDIAL MENISCECTOMY ;  Surgeon: Lorn Junes, MD;  Location: [place];  Service: Orthopedics;  Laterality: Left;  xerofoam, 4x4's, webril, ace wrap, ice wrap  . KNEE ARTHROSCOPY WITH MEDIAL MENISECTOMY Right 06/10/2013   Procedure: RIGHT KNEE ARTHROSCOPY WITH MEDIAL MENISECTOMY;  Surgeon: Lorn Junes, MD;  Location: [place];  Service: Orthopedics;  Laterality: Right;  partial lateral menisectomoy and chondroplasty  . OVARIAN CYST REMOVAL  04-25-10    OB History    Gravida Para Term Preterm AB Living   4 3 3  0 1 3   SAB TAB Ectopic Multiple Live Births                   Home Medications    Prior to Admission medications  Medication Sig Start Date End Date Taking? Authorizing Provider  aspirin EC 81 MG tablet Take 1 tablet (81 mg total) by mouth daily. 04/21/14  Yes Dunn, Ryan M, PA-C  Cyanocobalamin (B-12) 1000 MCG SUBL Place 1 tablet under the tongue daily. 06/25/16  Yes Steele Sizer, MD  Elastic Bandages & Supports (MEDICAL COMPRESSION STOCKINGS) MISC 2 Units by Does not apply route daily. 12/05/15  Yes Sowles, Drue Stager, MD  esomeprazole (NEXIUM) 40 MG capsule Take 1 capsule (40 mg total) by mouth daily before breakfast. 01/05/13  Yes Tanda Rockers, MD  metFORMIN (GLUCOPHAGE-XR) 500 MG 24 hr tablet Take 1 tablet (500 mg total) by mouth daily with breakfast. 02/11/17  Yes Sowles, Drue Stager, MD  SUMAtriptan (IMITREX) 50 MG tablet Take by mouth. 10/10/16  Yes [provider]  topiramate (TOPAMAX) 100 MG tablet Take by mouth. 10/10/16  04/08/17 Yes [provider]  traZODone (DESYREL) 50 MG tablet Take 0.5-1 tablets (25-50 mg total) by mouth at bedtime as needed for sleep. 02/11/17  Yes Steele Sizer, MD    Family History Family History  Problem Relation Age of Onset  . Early death Mother        Murdered when pt was 71 years old  . Diabetes Mother   . Hypertension Mother   . Gout Mother   . Kidney failure Mother   . Early death Sister 71       due to hemorage  . Alcohol abuse Maternal Uncle   . Alcohol abuse Other   . Arthritis Other   . Heart disease Other   . Other Other        cousin- phlebitis  . Diabetes Maternal Aunt   . Hyperlipidemia Maternal Aunt   . Hypertension Maternal Aunt   . Prostate cancer Maternal Uncle   . Breast cancer Neg Hx     Social History Social History  Substance Use Topics  . Smoking status: Never Smoker  . Smokeless tobacco: Never Used  . Alcohol use No     Allergies   Chocolate; Iodinated diagnostic agents; Iohexol; and Peanut-containing drug products   Review of Systems Review of Systems   Physical Exam Triage Vital Signs ED Triage Vitals  Enc Vitals Group     BP 03/16/17 0830 (!) 126/95     Pulse Rate 03/16/17 0830 80     Resp 03/16/17 0830 16     Temp 03/16/17 0830 98 F (36.7 C)     Temp Source 03/16/17 0830 Oral     SpO2 03/16/17 0830 100 %     Weight 03/16/17 0835 217 lb (98.4 kg)     Height --      Head Circumference --      Peak Flow --      Pain Score 03/16/17 0835 5     Pain Loc --      Pain Edu? --      Excl. in [place]? --    No data found.   Updated Vital Signs BP (!) 126/95 (BP Location: Left Arm)   Pulse 80   Temp 98 F (36.7 C) (Oral)   Resp 16   Wt 217 lb (98.4 kg)   SpO2 100%   BMI 39.69 kg/m   Visual Acuity Right Eye Distance:   Left Eye Distance:   Bilateral Distance:    Right Eye Near:   Left Eye Near:    Bilateral Near:     Physical Exam  Constitutional: She appears well-developed and well-nourished. No  distress.  Abdominal: Soft. Bowel sounds are normal. She exhibits no distension and no mass. There is tenderness (right upper quadrant and flank tenderness to palpation; no rebound or guarding). There is no rebound and no guarding.  Skin: She is not diaphoretic.  Nursing note and vitals reviewed.    UC Treatments / Results  Labs (all labs ordered are listed, but only abnormal results are displayed) Labs Reviewed  URINALYSIS, COMPLETE (UACMP) WITH MICROSCOPIC - Abnormal; Notable for the following:       Result Value   Hgb urine dipstick TRACE (*)    Squamous Epithelial / LPF 0-5 (*)    Bacteria, UA FEW (*)    All other components within normal limits  CBC WITH DIFFERENTIAL/PLATELET - Abnormal; Notable for the following:    RDW 14.7 (*)    All other components within normal limits  COMPREHENSIVE METABOLIC PANEL - Abnormal; Notable for the following:    Glucose, Bld 112 (*)    All other components within normal limits    EKG  EKG Interpretation None       Radiology Dg Abd 2 Views  Result Date: 03/16/2017 CLINICAL DATA:  Right-sided abdominal and flank pain for 1 day. EXAM: ABDOMEN - 2 VIEW COMPARISON:  10/25/2008 FINDINGS: The bowel gas pattern is normal. There is no evidence of free air. No radio-opaque calculi or other significant radiographic abnormality is seen. IMPRESSION: Negative. Electronically Signed   By: Earle Gell M.D.   On: 03/16/2017 09:28    Procedures Procedures (including critical care time)  Medications Ordered in UC Medications  ondansetron (ZOFRAN-ODT) disintegrating tablet 8 mg (8 mg Oral Given 03/16/17 0903)     Initial Impression / Assessment and Plan / UC Course  I have reviewed the triage vital signs and the nursing notes.  Pertinent labs & imaging results that were available during my care of the patient were reviewed by me and considered in my medical decision making (see chart for details).       Final Clinical Impressions(s) / UC  Diagnoses   Final diagnoses:  Acute right flank pain  Abdominal pain, right upper quadrant  (unknown etiology)  New Prescriptions Discharge Medication List as of 03/16/2017 10:10 AM     1. Labs/x-ray results (negative) and possible diagnoses reviewed with patient 2. Recommend patient go to Emergency Department for further evaluation and management  Controlled Substance Prescriptions [place] Controlled Substance Registry consulted? Not Applicable   Norval Gable, MD 03/16/17 1025

## 2017-03-16 NOTE — ED Notes (Signed)
Spoke with dr Archie Balboa regarding pt, no orders received at this time.

## 2017-03-18 ENCOUNTER — Ambulatory Visit (INDEPENDENT_AMBULATORY_CARE_PROVIDER_SITE_OTHER): Payer: 59 | Admitting: Family Medicine

## 2017-03-18 ENCOUNTER — Encounter: Payer: Self-pay | Admitting: Family Medicine

## 2017-03-18 VITALS — BP 124/78 | HR 93 | Temp 97.8°F | Resp 16 | Ht 62.0 in | Wt 219.1 lb

## 2017-03-18 DIAGNOSIS — R350 Frequency of micturition: Secondary | ICD-10-CM | POA: Diagnosis not present

## 2017-03-18 DIAGNOSIS — Z8739 Personal history of other diseases of the musculoskeletal system and connective tissue: Secondary | ICD-10-CM

## 2017-03-18 DIAGNOSIS — K760 Fatty (change of) liver, not elsewhere classified: Secondary | ICD-10-CM | POA: Diagnosis not present

## 2017-03-18 DIAGNOSIS — R110 Nausea: Secondary | ICD-10-CM | POA: Diagnosis not present

## 2017-03-18 DIAGNOSIS — R140 Abdominal distension (gaseous): Secondary | ICD-10-CM

## 2017-03-18 DIAGNOSIS — R1011 Right upper quadrant pain: Secondary | ICD-10-CM

## 2017-03-18 DIAGNOSIS — R109 Unspecified abdominal pain: Secondary | ICD-10-CM | POA: Diagnosis not present

## 2017-03-18 MED ORDER — GABAPENTIN 100 MG PO CAPS: 100.0000 mg | ORAL_CAPSULE | Freq: Every day | ORAL | 0 refills | Status: DC

## 2017-03-18 MED ORDER — CYCLOBENZAPRINE HCL 5 MG PO TABS: 5.0000 mg | ORAL_TABLET | Freq: Three times a day (TID) | ORAL | 0 refills | Status: DC | PRN

## 2017-03-18 NOTE — Progress Notes (Signed)
Name: Marie Vaughan   MRN: [id number]    DOB: [birth year]   Date:03/18/2017       Progress Note  Subjective  Chief Complaint  Chief Complaint  Patient presents with  . Hospitalization Follow-up    for rt side abdominal pain    HPI  ED follow up: patient felt dizzy at work on 03/15/2017 from her vestibular migraine, went home early from work and fell asleep, woke up around 9 pm with nausea and right flank pain, tried to fall asleep but woke up with worsening of symptoms around 3 am. Her daughter took her to Urgent Care in [facility] and she was sent to [facility] for further evaluation. UA showed trace of LE, had an [place] of liver that showed fatty liver, pain during exam of gallbladder but negative for gallbladder disease. CT negative for stones or appendicitis. She was sent home, states pain still present, still has some nausea, and right side of abdomen is distended, appetite is still poor. No fever or chills, pain is constant but aggravated by movements or palpation of abdomen. She states heating pad seems to decrease pain. She was given rx of Tramadol at [facility] but rx was not filled yet.    Patient Active Problem List   Diagnosis Date Noted  . Metabolic syndrome 81/27/5170  . OSA on CPAP 02/03/2016  . 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 Abnormal brain MRI 12/20/2015  . Dizziness 12/13/2015  . Near syncope 12/10/2015  . Atypical chest pain 08/22/2013  . Essential hypertension 08/22/2013  . Angio-edema   . GERD (gastroesophageal reflux disease)   . Migraine with aura and without status migrainosus   . Acne   . Anxiety   . Allergic rhinitis 10/05/2011  . Insomnia 08/30/2011  . Obesity 08/30/2011    Past Surgical History:  Procedure Laterality Date  . ABDOMINAL HYSTERECTOMY  04-25-10   Due to abnormal PAP  . CARDIAC CATHETERIZATION  04/2014   [facility]  . DILATION AND CURETTAGE OF UTERUS    . KNEE ARTHROSCOPY Left 06/10/2013   Procedure: LEFT KNEE ARTHROSCOPY KNEE WITH PARTIAL MEDIAL MENISCECTOMY ;   Surgeon: Lorn Junes, MD;  Location: [place];  Service: Orthopedics;  Laterality: Left;  xerofoam, 4x4's, webril, ace wrap, ice wrap  . KNEE ARTHROSCOPY WITH MEDIAL MENISECTOMY Right 06/10/2013   Procedure: RIGHT KNEE ARTHROSCOPY WITH MEDIAL MENISECTOMY;  Surgeon: Lorn Junes, MD;  Location: [place];  Service: Orthopedics;  Laterality: Right;  partial lateral menisectomoy and chondroplasty  . OVARIAN CYST REMOVAL  04-25-10    Family History  Problem Relation Age of Onset  . Early death Mother        Murdered when pt was 86 years old  . Diabetes Mother   . Hypertension Mother   . Gout Mother   . Kidney failure Mother   . Early death Sister 62       due to hemorage  . Alcohol abuse Maternal Uncle   . Alcohol abuse Other   . Arthritis Other   . Heart disease Other   . Other Other        cousin- phlebitis  . Diabetes Maternal Aunt   . Hyperlipidemia Maternal Aunt   . Hypertension Maternal Aunt   . Prostate cancer Maternal Uncle   . Breast cancer Neg Hx     Social History   Social History  . Marital status: Married    Spouse name: N/A  . Number of children: 3  .  Years of education: N/A   Occupational History  . Health information manager  [employer]   Social History Main Topics  . Smoking status: Never Smoker  . Smokeless tobacco: Never Used  . Alcohol use No  . Drug use: No  . Sexual activity: Yes    Partners: Male   Other Topics Concern  . Not on file   Social History Narrative   Lives in [place] with husband, two children gone . Daughter's are grown and out of the house. Her 47 yo is going to college                 Current Outpatient Prescriptions:  .  aspirin EC 81 MG tablet, Take 1 tablet (81 mg total) by mouth daily., Disp: 90 tablet, Rfl: 3 .  Elastic Bandages & Supports (MEDICAL COMPRESSION STOCKINGS) MISC, 2 Units by Does not apply route daily., Disp: 2 each, Rfl: 0 .  esomeprazole (NEXIUM) 40 MG capsule, Take 1  capsule (40 mg total) by mouth daily before breakfast., Disp: , Rfl:  .  metFORMIN (GLUCOPHAGE-XR) 500 MG 24 hr tablet, Take 1 tablet (500 mg total) by mouth daily with breakfast., Disp: 30 tablet, Rfl: 0 .  SUMAtriptan (IMITREX) 50 MG tablet, Take by mouth., Disp: , Rfl:  .  topiramate (TOPAMAX) 100 MG tablet, Take by mouth., Disp: , Rfl:  .  traZODone (DESYREL) 50 MG tablet, Take 0.5-1 tablets (25-50 mg total) by mouth at bedtime as needed for sleep., Disp: 30 tablet, Rfl: 0 .  Cyanocobalamin (B-12) 1000 MCG SUBL, Place 1 tablet under the tongue daily. (Patient not taking: Reported on 03/18/2017), Disp: 30 each, Rfl: 0 .  traMADol (ULTRAM) 50 MG tablet, Take 1 tablet (50 mg total) by mouth every 6 (six) hours as needed. (Patient not taking: Reported on 03/18/2017), Disp: 15 tablet, Rfl: 0  Allergies  Allergen Reactions  . Chocolate     Hives, itching  . Iodinated Diagnostic Agents   . Iohexol     CP and SOB  . Peanut-Containing Drug Products Swelling    cashews     ROS  Ten systems reviewed and is negative except as mentioned in HPI   Objective  Vitals:   03/18/17 1157  BP: 124/78  Pulse: 93  Resp: 16  Temp: 97.8 F (36.6 C)  SpO2: 97%  Weight: 219 lb 1 oz (99.4 kg)  Height: _0  (1.575 m)    Body mass index is 40.07 kg/m.  Physical Exam  Constitutional: Patient appears well-developed and well-nourished. Obese  No distress.  HEENT: head atraumatic, normocephalic, pupils equal and reactive to light,  neck supple, throat within normal limits Cardiovascular: Normal rate, regular rhythm and normal heart sounds.  No murmur heard. No BLE edema. Pulmonary/Chest: Effort normal and breath sounds normal. No respiratory distress. Abdominal: Soft.  There is  Tenderness RUQ, right flank, CVA tenderness, pain triggered by rotation of spine, no rashes right side of abdomen seems distended when compared to the left side Psychiatric: Patient has a normal mood and affect. behavior is  normal. Judgment and thought content normal. Muscular Skeletal: pain with rotation, lateral bending, not with flexion or extension , no tenderness during palpation of back   Recent Results (from the past 2160 hour(s))  Comprehensive metabolic panel     Status: Abnormal   Collection Time: 02/12/17  7:41 AM  Result Value Ref Range   Sodium 135 135 - 145 mmol/L   Potassium 3.2 (L) 3.5 - 5.1 mmol/L  Chloride 100 (L) 101 - 111 mmol/L   CO2 23 22 - 32 mmol/L   Glucose, Bld 127 (H) 65 - 99 mg/dL   BUN 17 6 - 20 mg/dL   Creatinine, Ser 0.97 0.44 - 1.00 mg/dL   Calcium 9.7 8.9 - 10.3 mg/dL   Total Protein 7.7 6.5 - 8.1 g/dL   Albumin 4.1 3.5 - 5.0 g/dL   AST 23 15 - 41 U/L   ALT 22 14 - 54 U/L   Alkaline Phosphatase 87 38 - 126 U/L   Total Bilirubin 0.8 0.3 - 1.2 mg/dL   GFR calc non Af Amer >60 >60 mL/min   GFR calc Af Amer >60 >60 mL/min    Comment: (NOTE) The eGFR has been calculated using the CKD EPI equation. This calculation has not been validated in all clinical situations. eGFR's persistently <60 mL/min signify possible Chronic Kidney Disease.    Anion gap 12 5 - 15  Hemoglobin A1c     Status: Abnormal   Collection Time: 02/12/17  7:41 AM  Result Value Ref Range   Hgb A1c MFr Bld 5.9 (H) 4.8 - 5.6 %    Comment: (NOTE)         Pre-diabetes: 5.7 - 6.4         Diabetes: >6.4         Glycemic control for adults with diabetes: <7.0    Mean Plasma Glucose 123 mg/dL    Comment: (NOTE) Performed At: [facility] [place], [place] [id number] Lindon Romp MD EH:[id number]   Insulin, random     Status: Abnormal   Collection Time: 02/12/17  7:41 AM  Result Value Ref Range   Insulin 51.3 (H) 2.6 - 24.9 uIU/mL    Comment: (NOTE) Performed At: [facility] [place], [place] [id number] Lindon Romp MD YY:[id number]   Lipid panel     Status: None   Collection Time: 02/12/17  7:41 AM  Result Value Ref Range   Cholesterol 155 0  - 200 mg/dL   Triglycerides 118 <150 mg/dL   HDL 41 >40 mg/dL   Total CHOL/HDL Ratio 3.8 RATIO   VLDL 24 0 - 40 mg/dL   LDL Cholesterol 90 0 - 99 mg/dL    Comment:        Total Cholesterol/HDL:CHD Risk Coronary Heart Disease Risk Table                     Men   Women  1/2 Average Risk   3.4   3.3  Average Risk       5.0   4.4  2 X Average Risk   9.6   7.1  3 X Average Risk  23.4   11.0        Use the calculated Patient Ratio above and the CHD Risk Table to determine the patient's CHD Risk.        ATP III CLASSIFICATION (LDL):  <100     mg/dL   Optimal  100-129  mg/dL   Near or Above                    Optimal  130-159  mg/dL   Borderline  160-189  mg/dL   High  >190     mg/dL   Very High   Vitamin B12     Status: None   Collection Time: 02/12/17  7:41 AM  Result Value Ref Range  Vitamin B-12 310 180 - 914 pg/mL    Comment: (NOTE) This assay is not validated for testing neonatal or myeloproliferative syndrome specimens for Vitamin B12 levels. Performed at [facility], [place] [address] [place], [place] [address]   Urinalysis, Complete w Microscopic     Status: Abnormal   Collection Time: 03/16/17  8:37 AM  Result Value Ref Range   Color, Urine YELLOW YELLOW   APPearance CLEAR CLEAR   Specific Gravity, Urine 1.020 1.005 - 1.030   pH 6.5 5.0 - 8.0   Glucose, UA NEGATIVE NEGATIVE mg/dL   Hgb urine dipstick TRACE (A) NEGATIVE   Bilirubin Urine NEGATIVE NEGATIVE   Ketones, ur NEGATIVE NEGATIVE mg/dL   Protein, ur NEGATIVE NEGATIVE mg/dL   Nitrite NEGATIVE NEGATIVE   Leukocytes, UA NEGATIVE NEGATIVE   Squamous Epithelial / LPF 0-5 (A) NONE SEEN   WBC, UA 0-5 0 - 5 WBC/hpf   RBC / HPF 0-5 0 - 5 RBC/hpf   Bacteria, UA FEW (A) NONE SEEN   Mucus PRESENT   CBC with Differential     Status: Abnormal   Collection Time: 03/16/17  9:00 AM  Result Value Ref Range   WBC 5.8 3.6 - 11.0 K/uL   RBC 4.47 3.80 - 5.20 MIL/uL   Hemoglobin 12.0 12.0 - 16.0 g/dL   HCT 36.3  35.0 - 47.0 %   MCV 81.2 80.0 - 100.0 fL   MCH 26.9 26.0 - 34.0 pg   MCHC 33.1 32.0 - 36.0 g/dL   RDW 14.7 (H) 11.5 - 14.5 %   Platelets 310 150 - 440 K/uL   Neutrophils Relative % 61 %   Neutro Abs 3.5 1.4 - 6.5 K/uL   Lymphocytes Relative 31 %   Lymphs Abs 1.8 1.0 - 3.6 K/uL   Monocytes Relative 6 %   Monocytes Absolute 0.3 0.2 - 0.9 K/uL   Eosinophils Relative 1 %   Eosinophils Absolute 0.1 0 - 0.7 K/uL   Basophils Relative 1 %   Basophils Absolute 0.0 0 - 0.1 K/uL  Comprehensive metabolic panel     Status: Abnormal   Collection Time: 03/16/17  9:00 AM  Result Value Ref Range   Sodium 138 135 - 145 mmol/L   Potassium 3.6 3.5 - 5.1 mmol/L   Chloride 104 101 - 111 mmol/L   CO2 25 22 - 32 mmol/L   Glucose, Bld 112 (H) 65 - 99 mg/dL   BUN 7 6 - 20 mg/dL   Creatinine, Ser 0.84 0.44 - 1.00 mg/dL   Calcium 9.4 8.9 - 10.3 mg/dL   Total Protein 7.9 6.5 - 8.1 g/dL   Albumin 4.3 3.5 - 5.0 g/dL   AST 22 15 - 41 U/L   ALT 21 14 - 54 U/L   Alkaline Phosphatase 92 38 - 126 U/L   Total Bilirubin 0.5 0.3 - 1.2 mg/dL   GFR calc non Af Amer >60 >60 mL/min   GFR calc Af Amer >60 >60 mL/min    Comment: (NOTE) The eGFR has been calculated using the CKD EPI equation. This calculation has not been validated in all clinical situations. eGFR's persistently <60 mL/min signify possible Chronic Kidney Disease.    Anion gap 9 5 - 15     PHQ2/9: Depression screen [facility] 2/9 02/11/2017 03/05/2016 12/05/2015 04/20/2015  Decreased Interest 0 0 0 0  Down, Depressed, Hopeless 0 0 0 0  PHQ - 2 Score 0 0 0 0     Fall Risk:  Fall Risk  02/11/2017 03/05/2016 12/05/2015 04/20/2015 03/10/2015  Falls in the past year? Yes No No No No  Number falls in past yr: 2 or more - - - -  Injury with Fall? No - - - -  Risk Factor Category  High Fall Risk - - - -     Assessment & Plan  1. Nausea  - NM Hepato W/EjeCT Fract; Future  2. Right flank pain  CT negative, explained sometimes kidney infection may not  show right away, but some of her symptoms seems muscular skeletal ( radiculitis) advised to monitor for rash ( Shingles) and try muscle relaxer and may fill rx of Tramadol given by EC - cyclobenzaprine (FLEXERIL) 5 MG tablet; Take 1 tablet (5 mg total) by mouth 3 (three) times daily as needed for muscle spasms. Not when driving or working  Dispense: 30 tablet; Refill: 0  3. Fatty liver  Discussed life style modification  4. Abdominal distension  - NM Hepato W/EjeCT Fract; Future  5. Right upper quadrant pain  - NM Hepato W/EjeCT Fract; Future  6. Urinary frequency  - Urine Culture  7. History of herniated intervertebral disc  Pain seems to be muscular skeletal, if urine culture negative I will send a prednisone taper, and if no improvement MRI thoracic spine  - gabapentin (NEURONTIN) 100 MG capsule; Take 1-3 capsules (100-300 mg total) by mouth at bedtime. Increase dose every 3 days to max of 300 mg at night  Dispense: 90 capsule; Refill: 0

## 2017-03-19 DIAGNOSIS — R350 Frequency of micturition: Secondary | ICD-10-CM | POA: Diagnosis not present

## 2017-03-20 ENCOUNTER — Telehealth: Payer: Self-pay

## 2017-03-20 NOTE — Telephone Encounter (Signed)
I called this patient to inform her that she has been scheduled to have her HIDA scan on Saturday, March 31, 2015 at 10am at [facility], but there was no answer. A message was left with that information on it and the instruction not to have anything to eat or drink after midnight as well as to arrive at least 15 mins early.

## 2017-03-21 LAB — URINE CULTURE: ORGANISM ID, BACTERIA: NO GROWTH

## 2017-03-26 ENCOUNTER — Ambulatory Visit: Payer: 59 | Admitting: Family Medicine

## 2017-03-30 ENCOUNTER — Ambulatory Visit
Admission: RE | Admit: 2017-03-30 | Discharge: 2017-03-30 | Disposition: A | Discharge status: Home / Self Care | Payer: 59 | Source: Ambulatory Visit | Attending: Family Medicine | Admitting: Family Medicine

## 2017-03-30 DIAGNOSIS — R140 Abdominal distension (gaseous): Secondary | ICD-10-CM | POA: Diagnosis not present

## 2017-03-30 DIAGNOSIS — R1011 Right upper quadrant pain: Secondary | ICD-10-CM | POA: Insufficient documentation

## 2017-03-30 DIAGNOSIS — R110 Nausea: Secondary | ICD-10-CM

## 2017-03-30 MED ORDER — TECHNETIUM TC 99M MEBROFENIN IV KIT
4.7900 | PACK | Freq: Once | INTRAVENOUS | Status: AC | PRN
  Administered 2017-03-30: 4.79 via INTRAVENOUS

## 2017-06-17 ENCOUNTER — Telehealth: Payer: Self-pay | Admitting: Cardiovascular Disease

## 2017-06-17 ENCOUNTER — Encounter: Payer: Self-pay | Admitting: Emergency Medicine

## 2017-06-17 ENCOUNTER — Emergency Department
Admission: EM | Admit: 2017-06-17 | Discharge: 2017-06-17 | Disposition: A | Discharge status: Home / Self Care | Payer: 59 | Attending: Emergency Medicine | Admitting: Emergency Medicine

## 2017-06-17 ENCOUNTER — Emergency Department: Payer: 59

## 2017-06-17 DIAGNOSIS — R0789 Other chest pain: Secondary | ICD-10-CM

## 2017-06-17 DIAGNOSIS — Z7982 Long term (current) use of aspirin: Secondary | ICD-10-CM | POA: Insufficient documentation

## 2017-06-17 DIAGNOSIS — Z7984 Long term (current) use of oral hypoglycemic drugs: Secondary | ICD-10-CM | POA: Insufficient documentation

## 2017-06-17 DIAGNOSIS — I10 Essential (primary) hypertension: Secondary | ICD-10-CM | POA: Diagnosis not present

## 2017-06-17 DIAGNOSIS — R1013 Epigastric pain: Secondary | ICD-10-CM | POA: Insufficient documentation

## 2017-06-17 DIAGNOSIS — R079 Chest pain, unspecified: Secondary | ICD-10-CM | POA: Diagnosis not present

## 2017-06-17 DIAGNOSIS — R1084 Generalized abdominal pain: Secondary | ICD-10-CM | POA: Diagnosis not present

## 2017-06-17 DIAGNOSIS — Z79899 Other long term (current) drug therapy: Secondary | ICD-10-CM | POA: Diagnosis not present

## 2017-06-17 LAB — BASIC METABOLIC PANEL
Anion gap: 7 (ref 5–15)
BUN: 8 mg/dL (ref 6–20)
CHLORIDE: 104 mmol/L (ref 101–111)
CO2: 25 mmol/L (ref 22–32)
Calcium: 9.4 mg/dL (ref 8.9–10.3)
Creatinine, Ser: 0.59 mg/dL (ref 0.44–1.00)
Glucose, Bld: 90 mg/dL (ref 65–99)
Potassium: 3.2 mmol/L — ABNORMAL LOW (ref 3.5–5.1)
SODIUM: 136 mmol/L (ref 135–145)

## 2017-06-17 LAB — CBC
HEMATOCRIT: 38.7 % (ref 35.0–47.0)
Hemoglobin: 12.7 g/dL (ref 12.0–16.0)
MCH: 25.9 pg — ABNORMAL LOW (ref 26.0–34.0)
MCHC: 32.9 g/dL (ref 32.0–36.0)
MCV: 78.8 fL — AB (ref 80.0–100.0)
PLATELETS: 291 10*3/uL (ref 150–440)
RBC: 4.91 MIL/uL (ref 3.80–5.20)
RDW: 14.9 % — AB (ref 11.5–14.5)
WBC: 7.1 10*3/uL (ref 3.6–11.0)

## 2017-06-17 LAB — TROPONIN I: Troponin I: <0.03 ng/mL (ref <0.03)

## 2017-06-17 MED ORDER — FAMOTIDINE 20 MG PO TABS
ORAL_TABLET | ORAL | Status: AC
  Filled 2017-06-17: qty 1

## 2017-06-17 MED ORDER — GI COCKTAIL ~~LOC~~
ORAL | Status: AC
  Filled 2017-06-17: qty 30

## 2017-06-17 MED ORDER — FAMOTIDINE 20 MG PO TABS
20.0000 mg | ORAL_TABLET | Freq: Once | ORAL | Status: AC
  Administered 2017-06-17: 20 mg via ORAL

## 2017-06-17 MED ORDER — FAMOTIDINE 20 MG PO TABS: 20.0000 mg | ORAL_TABLET | Freq: Two times a day (BID) | ORAL | 0 refills | Status: DC

## 2017-06-17 MED ORDER — GI COCKTAIL ~~LOC~~: 30.0000 mL | Freq: Once | ORAL | Status: DC

## 2017-06-17 NOTE — Discharge Instructions (Signed)
Please make an appointment to establish care with gastroenterology within the next month or so.  Begin taking Pepcid twice a day for your symptoms and return to the emergency department for any concerns whatsoever.  It was a pleasure to take care of you today, and thank you for coming to our emergency department.  If you have any questions or concerns before leaving please ask the nurse to grab me and I'm more than happy to go through your aftercare instructions again.  If you were prescribed any opioid pain medication today such as Norco, Vicodin, Percocet, morphine, hydrocodone, or oxycodone please make sure you do not drive when you are taking this medication as it can alter your ability to drive safely.  If you have any concerns once you are home that you are not improving or are in fact getting worse before you can make it to your follow-up appointment, please do not hesitate to call 911 and come back for further evaluation.  Darel Hong, MD  Results for orders placed or performed during the hospital encounter of 44/31/54  Basic metabolic panel  Result Value Ref Range   Sodium 136 135 - 145 mmol/L   Potassium 3.2 (L) 3.5 - 5.1 mmol/L   Chloride 104 101 - 111 mmol/L   CO2 25 22 - 32 mmol/L   Glucose, Bld 90 65 - 99 mg/dL   BUN 8 6 - 20 mg/dL   Creatinine, Ser 0.59 0.44 - 1.00 mg/dL   Calcium 9.4 8.9 - 10.3 mg/dL   GFR calc non Af Amer >60 >60 mL/min   GFR calc Af Amer >60 >60 mL/min   Anion gap 7 5 - 15  CBC  Result Value Ref Range   WBC 7.1 3.6 - 11.0 K/uL   RBC 4.91 3.80 - 5.20 MIL/uL   Hemoglobin 12.7 12.0 - 16.0 g/dL   HCT 38.7 35.0 - 47.0 %   MCV 78.8 (L) 80.0 - 100.0 fL   MCH 25.9 (L) 26.0 - 34.0 pg   MCHC 32.9 32.0 - 36.0 g/dL   RDW 14.9 (H) 11.5 - 14.5 %   Platelets 291 150 - 440 K/uL  Troponin I  Result Value Ref Range   Troponin I <0.03 <0.03 ng/mL   Dg Chest 2 View  Result Date: 06/17/2017 CLINICAL DATA:  Left-sided chest pain, left arm numbness EXAM: CHEST   2 VIEW COMPARISON:  12/10/2015 FINDINGS: The heart size and mediastinal contours are within normal limits. Both lungs are clear. The visualized skeletal structures are unremarkable. IMPRESSION: No active cardiopulmonary disease. Electronically Signed   By: Kathreen Devoid   On: 06/17/2017 13:05

## 2017-06-17 NOTE — ED Provider Notes (Signed)
[facility] Emergency Department Provider Note  ____________________________________________   First MD Initiated Contact with Patient 06/17/17 1319     (approximate)  I have reviewed the triage vital signs and the nursing notes.   HISTORY  Chief Complaint Chest Pain    HPI Marie Vaughan is a 46 y.o. female who self presents to the emergency department with epigastric pain and chest pain that began last night when she laid down.  It is burning and uncomfortable associated with nausea and dry cough.  Associated with shortness of breath.  Her symptoms are moderate severity.  They seem to come on suddenly and are intermittent.  Her pain is nonexertional.  She has had some burping and a hoarse voice recently.  She has been worked up extensively for cardiac issues in the past and is recently had a negative cardiac catheterization.  Her pain is nonradiating.   Past Medical History:  Diagnosis Date  . Acne   . Acute bronchitis   . Angio-edema   . Anxiety   . Anxiety state, unspecified   . Cervicalgia   . Contact lens/glasses fitting    wears contacts or glasses  . Dysmenorrhea   . GERD (gastroesophageal reflux disease)   . History of cardiac cath    a. 04/29/2014: LM nl, LAD no dz, D1 no dz, D2 very small vessel, D3 very small vessel, LCx nl, OM1 very small vessel, OM2 medium sized vessel, OM 3 no dz, RCA no dz, PDA no dz   . History of stress test    a. Lexiscan 08/25/13: no sig ischemia, attenuation artifact noted, no sig WMA noted, EF 69%, no EKG changes concerning for ischemia  . HTN (hypertension)   . Iron deficiency anemia secondary to blood loss (chronic)   . Migraine with aura, without mention of intractable migraine without mention of status migrainosus   . Obesity   . 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 Pain in thoracic spine   . Pharyngitis   . Tear of medial meniscus of left knee   . Tear of medial meniscus of right knee   .  Vaginal delivery    x 3    Patient Active Problem List   Diagnosis Date Noted  . Metabolic syndrome 34/74/2595  . OSA on CPAP 02/03/2016  . 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 Abnormal brain MRI 12/20/2015  . Dizziness 12/13/2015  . Near syncope 12/10/2015  . Atypical chest pain 08/22/2013  . Essential hypertension 08/22/2013  . Angio-edema   . GERD (gastroesophageal reflux disease)   . Migraine with aura and without status migrainosus   . Acne   . Anxiety   . Allergic rhinitis 10/05/2011  . Insomnia 08/30/2011  . Obesity 08/30/2011    Past Surgical History:  Procedure Laterality Date  . ABDOMINAL HYSTERECTOMY  04-25-10   Due to abnormal PAP  . CARDIAC CATHETERIZATION  04/2014   [facility]  . DILATION AND CURETTAGE OF UTERUS    . KNEE ARTHROSCOPY Left 06/10/2013   Procedure: LEFT KNEE ARTHROSCOPY KNEE WITH PARTIAL MEDIAL MENISCECTOMY ;  Surgeon: Lorn Junes, MD;  Location: [place];  Service: Orthopedics;  Laterality: Left;  xerofoam, 4x4's, webril, ace wrap, ice wrap  . KNEE ARTHROSCOPY WITH MEDIAL MENISECTOMY Right 06/10/2013   Procedure: RIGHT KNEE ARTHROSCOPY WITH MEDIAL MENISECTOMY;  Surgeon: Lorn Junes, MD;  Location: [place];  Service: Orthopedics;  Laterality: Right;  partial lateral menisectomoy and chondroplasty  .  OVARIAN CYST REMOVAL  04-25-10    Prior to Admission medications   Medication Sig Start Date End Date Taking? Authorizing Provider  aspirin EC 81 MG tablet Take 1 tablet (81 mg total) by mouth daily. 04/21/14   Dunn, Areta Haber, PA-C  Cyanocobalamin (B-12) 1000 MCG SUBL Place 1 tablet under the tongue daily. Patient not taking: Reported on 03/18/2017 06/25/16   Steele Sizer, MD  cyclobenzaprine (FLEXERIL) 5 MG tablet Take 1 tablet (5 mg total) by mouth 3 (three) times daily as needed for muscle spasms. Not when driving or working 1/61/09   Steele Sizer, MD  Elastic Bandages & Supports (MEDICAL COMPRESSION STOCKINGS)  MISC 2 Units by Does not apply route daily. 12/05/15   Steele Sizer, MD  esomeprazole (NEXIUM) 40 MG capsule Take 1 capsule (40 mg total) by mouth daily before breakfast. 01/05/13   Tanda Rockers, MD  famotidine (PEPCID) 20 MG tablet Take 1 tablet (20 mg total) by mouth 2 (two) times daily. 06/17/17 06/17/18  Darel Hong, MD  gabapentin (NEURONTIN) 100 MG capsule Take 1-3 capsules (100-300 mg total) by mouth at bedtime. Increase dose every 3 days to max of 300 mg at night 03/18/17   Steele Sizer, MD  metFORMIN (GLUCOPHAGE-XR) 500 MG 24 hr tablet Take 1 tablet (500 mg total) by mouth daily with breakfast. 02/11/17   Steele Sizer, MD  SUMAtriptan (IMITREX) 50 MG tablet Take by mouth. 10/10/16   [provider]  traMADol (ULTRAM) 50 MG tablet Take 1 tablet (50 mg total) by mouth every 6 (six) hours as needed. Patient not taking: Reported on 03/18/2017 03/16/17 03/16/18  Nance Pear, MD  traZODone (DESYREL) 50 MG tablet Take 0.5-1 tablets (25-50 mg total) by mouth at bedtime as needed for sleep. 02/11/17   Steele Sizer, MD    Allergies Chocolate; Iodinated diagnostic agents; Iohexol; Peanut-containing drug products; and Pineapple  Family History  Problem Relation Age of Onset  . Early death Mother        Murdered when pt was 15 years old  . Diabetes Mother   . Hypertension Mother   . Gout Mother   . Kidney failure Mother   . Early death Sister 35       due to hemorage  . Alcohol abuse Maternal Uncle   . Alcohol abuse Other   . Arthritis Other   . Heart disease Other   . Other Other        cousin- phlebitis  . Diabetes Maternal Aunt   . Hyperlipidemia Maternal Aunt   . Hypertension Maternal Aunt   . Prostate cancer Maternal Uncle   . Breast cancer Neg Hx     Social History Social History   Tobacco Use  . Smoking status: Never Smoker  . Smokeless tobacco: Never Used  Substance Use Topics  . Alcohol use: No    Alcohol/week: 0.0 oz  . Drug use: No     Review of Systems Constitutional: No fever/chills Eyes: No visual changes. ENT: No sore throat. Cardiovascular: Positive for chest pain. Respiratory: Negative for shortness of breath. Gastrointestinal: Positive for abdominal pain.  Positive for nausea, no vomiting.  No diarrhea.  No constipation. Genitourinary: Negative for dysuria. Musculoskeletal: Negative for back pain. Skin: Negative for rash. Neurological: Negative for headaches, focal weakness or numbness.   ____________________________________________   PHYSICAL EXAM:  VITAL SIGNS: ED Triage Vitals  Enc Vitals Group     BP 06/17/17 1154 (!) 152/118     Pulse Rate 06/17/17 1154 92  Resp 06/17/17 1154 20     Temp --      Temp Source 06/17/17 1154 Oral     SpO2 06/17/17 1154 100 %     Weight 06/17/17 1152 230 lb (104.3 kg)     Height 06/17/17 1152 5\' 2"  (1.575 m)     Head Circumference --      Peak Flow --      Pain Score 06/17/17 1152 5     Pain Loc --      Pain Edu? --      Excl. in [place]? --     Constitutional: Alert and oriented x4 pleasant cooperative speaks in full clear sentences no diaphoresis Eyes: PERRL EOMI. Head: Atraumatic. Nose: No congestion/rhinnorhea. Mouth/Throat: No trismus Neck: No stridor.   Cardiovascular: Normal rate, regular rhythm. Grossly normal heart sounds.  Good peripheral circulation. Respiratory: Normal respiratory effort.  No retractions. Lungs CTAB and moving good air Gastrointestinal: Obese soft nontender Musculoskeletal: No lower extremity edema   Neurologic:  Normal speech and language. No gross focal neurologic deficits are appreciated. Skin:  Skin is warm, dry and intact. No rash noted. Psychiatric: Mood and affect are normal. Speech and behavior are normal.    ____________________________________________   DIFFERENTIAL includes but not limited to  Gastritis, gastric reflux, esophagitis, acute coronary syndrome, pulmonary embolism, aortic  dissection ____________________________________________   LABS (all labs ordered are listed, but only abnormal results are displayed)  Labs Reviewed  BASIC METABOLIC PANEL - Abnormal; Notable for the following components:      Result Value   Potassium 3.2 (*)    All other components within normal limits  CBC - Abnormal; Notable for the following components:   MCV 78.8 (*)    MCH 25.9 (*)    RDW 14.9 (*)    All other components within normal limits  TROPONIN I    Blood work reviewed by me shows no acute ischemia __________________________________________  EKG  ED ECG REPORT I, Darel Hong, the attending physician, personally viewed and interpreted this ECG.  Date: 06/17/2017 EKG Time:  Rate: 83 Rhythm: normal sinus rhythm QRS Axis: normal Intervals: normal ST/T Wave abnormalities: normal Narrative Interpretation: no evidence of acute ischemia  ____________________________________________  RADIOLOGY  Chest x-ray reviewed by me with no acute disease ____________________________________________   PROCEDURES  Procedure(s) performed: no  Procedures  Critical Care performed: no  Observation: no ____________________________________________   INITIAL IMPRESSION / ASSESSMENT AND PLAN / ED COURSE  Pertinent labs & imaging results that were available during my care of the patient were reviewed by me and considered in my medical decision making (see chart for details).  The patient arrives with atypical chest pain, normal EKG, normal troponin, and normal chest x-ray.  She was recently worked up extensively and I do not believe that this is a cardiac etiology.  I had a lengthy discussion with the patient regarding the likely diagnosis of gastric reflux and I will refer her to GI as an outpatient.  She verbalized understanding and agreement with the plan.      ____________________________________________   FINAL CLINICAL IMPRESSION(S) / ED DIAGNOSES  Final  diagnoses:  Epigastric pain  Atypical chest pain      NEW MEDICATIONS STARTED DURING THIS VISIT:  This SmartLink is deprecated. Use AVSMEDLIST instead to display the medication list for a patient.   Note:  This document was prepared using Dragon voice recognition software and may include unintentional dictation errors.     Darel Hong, MD 06/19/17  1251  

## 2017-06-17 NOTE — Telephone Encounter (Signed)
New message  Patient states she has chest discomfort, started last night after coughing . Please call. Only wants to be seen in [place] office, offered appointment in [place] on 11/27 with APP, pt declined.     Pt c/o of Chest Pain: STAT if CP now or developed within 24 hours  1. Are you having CP right now? YES  2. Are you experiencing any other symptoms (ex. SOB, nausea, vomiting, sweating)? SOB, coughing, sweating, burping  3. How long have you been experiencing CP? 1 day  4. Is your CP continuous or coming and going? Coming and going  5. Have you taken Nitroglycerin? NO ?

## 2017-06-17 NOTE — Telephone Encounter (Signed)
Pt reports watching TV last night when she suddenly felt like her airway was cut off and couldn't breathe. Took deep breath which made her cough. 10 out of 10 pain. She reports burping, sweating, headache, left jaw pain, and tingling fingers on her left hand.  Spouse wanted to take her to the ED but she refused. Sx lasted 30 minutes.  She then felt exhausted but couldn't breath when laying flat; Slept with pillows.  Today she was dizzy and lightheaded when she got up. Felt unstable walking through the parking lot to work.   Continued chest discomfort, headache and burping today. No appetite. Lance crackers and tea today.  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  As symptoms continue, I have advised her to proceed to the ED to be accompanied by a co-worker.  Initially did not want to go but has now agreed.  She will go at this time.

## 2017-06-17 NOTE — ED Notes (Signed)
inst and rx given to patient, sig pad not working

## 2017-06-17 NOTE — ED Triage Notes (Signed)
Pt comes into the ED via POV c/o chest pain that occurred last night.  Patient states it was a sudden feeling of gas, chest pain, dizziness, paraesthesias, and shortness of breath.  Patient appears to get winded with exertion today.  Patient denies any radiation of pain, but still describes it as a discomfort.  Patient in NAD at this time.  Patient attempted to get in with her cardiologist Dr. Fletcher Anon, but he had no opening and sent her here. Patient has h/o GERD but unsure why she is continually having chest pain.

## 2017-06-18 ENCOUNTER — Ambulatory Visit: Payer: Self-pay | Admitting: Physician Assistant

## 2017-06-19 ENCOUNTER — Telehealth: Payer: Self-pay | Admitting: Family Medicine

## 2017-06-19 DIAGNOSIS — K219 Gastro-esophageal reflux disease without esophagitis: Secondary | ICD-10-CM

## 2017-06-19 DIAGNOSIS — R0789 Other chest pain: Secondary | ICD-10-CM

## 2017-06-19 NOTE — Telephone Encounter (Signed)
-----   Message from Dennard Schaumann, [place] sent at 06/19/2017 11:56 AM EST ----- Patient called stating that she was recently admitted to the hospital for chest pain and was told that a referral was going to be placed with Dr. [name].   Have you seen this order? If not, is it ok to proceed with this referral?  Thanks  Tisha

## 2017-06-19 NOTE — Telephone Encounter (Signed)
I have reviewed the ER documentation, it looks like they recommended that she call Dr. Dorothey Baseman office directly.  This appears to be an ongoing issue for which she has seen Dr. Ancil Boozer in the past along with multiple ER visits.  Please proceed with referral if she is unable to schedule herself.  Thank you!

## 2017-06-20 NOTE — Addendum Note (Signed)
Addended by: Saunders Glance A on: 06/20/2017 08:11 AM   Modules accepted: Orders

## 2017-06-20 NOTE — Telephone Encounter (Signed)
Referral has been placed. 

## 2017-06-26 ENCOUNTER — Encounter: Payer: Self-pay | Admitting: Gastroenterology

## 2017-06-26 ENCOUNTER — Other Ambulatory Visit: Payer: Self-pay

## 2017-06-26 ENCOUNTER — Ambulatory Visit: Payer: 59 | Admitting: Gastroenterology

## 2017-06-26 VITALS — BP 154/91 | HR 93 | Temp 98.7°F | Ht 62.0 in | Wt 222.2 lb

## 2017-06-26 DIAGNOSIS — G8929 Other chronic pain: Secondary | ICD-10-CM

## 2017-06-26 DIAGNOSIS — R718 Other abnormality of red blood cells: Secondary | ICD-10-CM | POA: Diagnosis not present

## 2017-06-26 DIAGNOSIS — R1013 Epigastric pain: Secondary | ICD-10-CM

## 2017-06-26 DIAGNOSIS — Z1211 Encounter for screening for malignant neoplasm of colon: Secondary | ICD-10-CM

## 2017-06-26 MED ORDER — NA SULFATE-K SULFATE-MG SULF 17.5-3.13-1.6 GM/177ML PO SOLN: 1.0000 | ORAL | 0 refills | Status: DC

## 2017-06-26 NOTE — Patient Instructions (Signed)
Please use Miralax daily as directed.   The prescription for the bowel prep has been sent to the [facility] outpatient pharmacy.   Labs have been ordered today. Please go the [facility] and check in at the medical mall registration desk.

## 2017-06-26 NOTE — Progress Notes (Signed)
Marie Antigua, MD [address] [place], [place], [place], [address] [address], [place], [place], [place], [address] Phone: [phone]  Fax: [phone]  Consultation  Referring Provider:     Steele Sizer, MD Primary Care Physician:  Steele Sizer, MD Primary Gastroenterologist:  Virgel Manifold, MD        Reason for Consultation:     Abdominal pain  Date of Consultation:  06/26/2017         HPI:   Marie Vaughan is a 46 y.o. female referred due to 1 year history of dyspepsia and abdominal pain.  Patient reports diffuse abdominal pain with and without eating.  Is cramping, 5/10, bilateral upper quadrant, associated with nausea but no vomiting.  She reports relaxing and sitting in a bent position helps the pain.  Reports altered bowel habits, used to have regular bowel movements but now has days of constipation.  At times goes 1 week without having any bowel movements.  Last bowel movement was yesterday and she states it was regular soft.  No blood in bowel habits.  Reports 1 colonoscopy done over 10 years ago for bright red blood per rectum and states that was normal.  No immediate family history of colon cancer.  No dysphagia.  Reports heartburn frequently.  Takes Nexium as needed, only takes it once every 2 weeks.  Was started on Pepcid by the ER last week, she states she has been taking this and that has not helped her symptoms.  EKG and troponin was normal in the ER last week.  No previous EGDs.  CT abdomen in August 2018 was normal except for mild hepatic steatosis.  Abdominal ultrasound showed no gallstones or wall thickening of the gallbladder.  Common bile duct was normal at 5 mm.  Of note her CBC from November 26 shows a normal hemoglobin of 12.7, however MCV 78.8.  Patient denies any signs of active bleeding.  She has had a hysterectomy, in 2010 and states he is to have heavy periods before then.  Is not on iron replacement.  No previous iron workup is  available.  Past Medical History:  Diagnosis Date  . Acne   . Acute bronchitis   . Angio-edema   . Anxiety   . Anxiety state, unspecified   . Cervicalgia   . Contact lens/glasses fitting    wears contacts or glasses  . Dysmenorrhea   . GERD (gastroesophageal reflux disease)   . History of cardiac cath    a. 04/29/2014: LM nl, LAD no dz, D1 no dz, D2 very small vessel, D3 very small vessel, LCx nl, OM1 very small vessel, OM2 medium sized vessel, OM 3 no dz, RCA no dz, PDA no dz   . History of stress test    a. Lexiscan 08/25/13: no sig ischemia, attenuation artifact noted, no sig WMA noted, EF 69%, no EKG changes concerning for ischemia  . HTN (hypertension)   . Iron deficiency anemia secondary to blood loss (chronic)   . Migraine with aura, without mention of intractable migraine without mention of status migrainosus   . Obesity   . 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 Pain in thoracic spine   . Pharyngitis   . Tear of medial meniscus of left knee   . Tear of medial meniscus of right knee   . Vaginal delivery    x 3    Past Surgical History:  Procedure Laterality Date  . ABDOMINAL HYSTERECTOMY  04-25-10  Due to abnormal PAP  . CARDIAC CATHETERIZATION  04/2014   [facility]  . DILATION AND CURETTAGE OF UTERUS    . KNEE ARTHROSCOPY Left 06/10/2013   Procedure: LEFT KNEE ARTHROSCOPY KNEE WITH PARTIAL MEDIAL MENISCECTOMY ;  Surgeon: Lorn Junes, MD;  Location: [place];  Service: Orthopedics;  Laterality: Left;  xerofoam, 4x4's, webril, ace wrap, ice wrap  . KNEE ARTHROSCOPY WITH MEDIAL MENISECTOMY Right 06/10/2013   Procedure: RIGHT KNEE ARTHROSCOPY WITH MEDIAL MENISECTOMY;  Surgeon: Lorn Junes, MD;  Location: [place];  Service: Orthopedics;  Laterality: Right;  partial lateral menisectomoy and chondroplasty  . OVARIAN CYST REMOVAL  04-25-10    Prior to Admission medications   Medication Sig Start Date End Date Taking?  Authorizing Provider  aspirin EC 81 MG tablet Take 1 tablet (81 mg total) by mouth daily. 04/21/14  Yes Dunn, Areta Haber, PA-C  Elastic Bandages & Supports (MEDICAL COMPRESSION STOCKINGS) MISC 2 Units by Does not apply route daily. 12/05/15  Yes Sowles, Drue Stager, MD  famotidine (PEPCID) 20 MG tablet Take 1 tablet (20 mg total) by mouth 2 (two) times daily. 06/17/17 06/17/18 Yes Darel Hong, MD  SUMAtriptan (IMITREX) 50 MG tablet Take by mouth. 10/10/16  Yes [provider]  Cyanocobalamin (B-12) 1000 MCG SUBL Place 1 tablet under the tongue daily. Patient not taking: Reported on 03/18/2017 06/25/16   Steele Sizer, MD  cyclobenzaprine (FLEXERIL) 5 MG tablet Take 1 tablet (5 mg total) by mouth 3 (three) times daily as needed for muscle spasms. Not when driving or working Patient not taking: Reported on 06/26/2017 03/18/17   Steele Sizer, MD  esomeprazole (NEXIUM) 40 MG capsule Take 1 capsule (40 mg total) by mouth daily before breakfast. Patient not taking: Reported on 06/26/2017 01/05/13   Tanda Rockers, MD  gabapentin (NEURONTIN) 100 MG capsule Take 1-3 capsules (100-300 mg total) by mouth at bedtime. Increase dose every 3 days to max of 300 mg at night Patient not taking: Reported on 06/26/2017 03/18/17   Steele Sizer, MD  metFORMIN (GLUCOPHAGE-XR) 500 MG 24 hr tablet Take 1 tablet (500 mg total) by mouth daily with breakfast. Patient not taking: Reported on 06/26/2017 02/11/17   Steele Sizer, MD  Na Sulfate-K Sulfate-Mg Sulf (SUPREP BOWEL PREP KIT) 17.5-3.13-1.6 GM/177ML SOLN Take 1 kit by mouth as directed. 06/26/17   Virgel Manifold, MD  topiramate (TOPAMAX) 100 MG tablet Take by mouth. 10/10/16   [provider]  traMADol (ULTRAM) 50 MG tablet Take 1 tablet (50 mg total) by mouth every 6 (six) hours as needed. Patient not taking: Reported on 03/18/2017 03/16/17 03/16/18  Nance Pear, MD  traZODone (DESYREL) 50 MG tablet Take 0.5-1 tablets (25-50 mg total) by mouth at  bedtime as needed for sleep. Patient not taking: Reported on 06/26/2017 02/11/17   Steele Sizer, MD    Family History  Problem Relation Age of Onset  . Early death Mother        Murdered when pt was 69 years old  . Diabetes Mother   . Hypertension Mother   . Gout Mother   . Kidney failure Mother   . Early death Sister 9       due to hemorage  . Alcohol abuse Maternal Uncle   . Alcohol abuse Other   . Arthritis Other   . Heart disease Other   . Other Other        cousin- phlebitis  . Diabetes Maternal Aunt   .  Hyperlipidemia Maternal Aunt   . Hypertension Maternal Aunt   . Prostate cancer Maternal Uncle   . Breast cancer Neg Hx      Social History   Tobacco Use  . Smoking status: Never Smoker  . Smokeless tobacco: Never Used  Substance Use Topics  . Alcohol use: No    Alcohol/week: 0.0 oz  . Drug use: No    Allergies as of 06/26/2017 - Review Complete 06/26/2017  Allergen Reaction Noted  . Chocolate  10/27/2013  . Chocolate flavor Other (See Comments) 10/27/2013  . Iodinated diagnostic agents    . Iohexol  01/05/2013  . Peanut-containing drug products Swelling 06/08/2013  . Pineapple  06/17/2017    Review of Systems:    All systems reviewed and negative except where noted in HPI.   Physical Exam:  Vital signs in last 24 hours: Vitals:   06/26/17 1454  BP: (!) 154/91  Pulse: 93  Temp: 98.7 F (37.1 C)  TempSrc: Oral  Weight: 222 lb 3.2 oz (100.8 kg)  Height: '5\' 2"'  (1.575 m)     General:   Pleasant, cooperative in NAD Head:  Normocephalic and atraumatic. Eyes:   No icterus.   Conjunctiva pink. PERRLA. Ears:  Normal auditory acuity. Neck:  Supple; no masses or thyroidomegaly Lungs: Respirations even and unlabored. Lungs clear to auscultation bilaterally.   No wheezes, crackles, or rhonchi.  Heart:  Regular rate and rhythm;  Without murmur, clicks, rubs or gallops Abdomen:  Soft, nondistended, nontender. Normal bowel sounds. No appreciable masses or  hepatomegaly.  No rebound or guarding.  Neurologic:  Alert and oriented x3;  grossly normal neurologically. Skin:  Intact without significant lesions or rashes. Cervical Nodes:  No significant cervical adenopathy. Psych:  Alert and cooperative. Normal affect.  LAB RESULTS: November 26 labs reviewed STUDIES: Previous CT and a abdominal ultrasound report is reviewed   Impression / Plan:   Marie Vaughan is a 46 y.o. y/o female with 1 year history of abdominal pain, chronic heartburn unrelieved with PPI, microcytosis note dated on lab work  Patient is 46 years old The latest [place] guidelines recommend average risk screening colonoscopy at 46 years old.  In addition, her lab work shows microcytosis and she will need anemia workup and colonoscopy for both screening and possible iron deficiency (pending ferritin and iron level).  In addition she has had altered bowel habits.  We will also schedule EGD for due to chronic heartburn, dyspepsia, and microcytosis and possible iron deficiency  She has had an hysterectomy for menorrhagia in the past and is not on iron replacement or has not seen any other sites of active bleeding.  Since the Nexium does not help her, and her last BMP showed hypokalemia in the ER and she did not receive any replacement for this, I have asked her to continue to hold her Nexium as that can lead to hypokalemia as well.  Will check BMP and magnesium and see if she is still hypokalemic and patient may need replacement at that is still present.  She denies any loose stools at this time.  Will start MiraLAX due to constipation  I have discussed alternative options, risks & benefits,  which include, but are not limited to, bleeding, infection, perforation,respiratory complication & drug reaction.  The patient agrees with this plan & written consent will be obtained.     Thank you for involving me in the care of this patient.      Malarie Tappen B  Bonna Gains, MD   06/26/2017, 4:39 PM

## 2017-06-27 ENCOUNTER — Other Ambulatory Visit: Payer: Self-pay

## 2017-06-27 ENCOUNTER — Encounter: Payer: Self-pay | Admitting: *Deleted

## 2017-06-28 ENCOUNTER — Other Ambulatory Visit
Admission: RE | Admit: 2017-06-28 | Discharge: 2017-06-28 | Disposition: A | Discharge status: Home / Self Care | Payer: 59 | Source: Ambulatory Visit | Attending: Gastroenterology | Admitting: Gastroenterology

## 2017-06-28 ENCOUNTER — Other Ambulatory Visit: Payer: Self-pay

## 2017-06-28 ENCOUNTER — Encounter: Payer: Self-pay | Admitting: Gastroenterology

## 2017-06-28 DIAGNOSIS — R718 Other abnormality of red blood cells: Secondary | ICD-10-CM | POA: Insufficient documentation

## 2017-06-28 DIAGNOSIS — R1013 Epigastric pain: Secondary | ICD-10-CM | POA: Insufficient documentation

## 2017-06-28 DIAGNOSIS — G8929 Other chronic pain: Secondary | ICD-10-CM | POA: Diagnosis not present

## 2017-06-28 DIAGNOSIS — D509 Iron deficiency anemia, unspecified: Secondary | ICD-10-CM

## 2017-06-28 LAB — BASIC METABOLIC PANEL
ANION GAP: 8 (ref 5–15)
BUN: 8 mg/dL (ref 6–20)
CHLORIDE: 106 mmol/L (ref 101–111)
CO2: 24 mmol/L (ref 22–32)
Calcium: 9.2 mg/dL (ref 8.9–10.3)
Creatinine, Ser: 0.62 mg/dL (ref 0.44–1.00)
GFR calc Af Amer: >60 mL/min (ref >60)
GLUCOSE: 108 mg/dL — AB (ref 65–99)
POTASSIUM: 3.1 mmol/L — AB (ref 3.5–5.1)
Sodium: 138 mmol/L (ref 135–145)

## 2017-06-28 LAB — IRON AND TIBC
IRON: 22 ug/dL — AB (ref 28–170)
SATURATION RATIOS: 5 % — AB (ref 10.4–31.8)
TIBC: 459 ug/dL — AB (ref 250–450)
UIBC: 437 ug/dL

## 2017-06-28 LAB — FERRITIN: Ferritin: 29 ng/mL (ref 11–307)

## 2017-06-28 LAB — MAGNESIUM: MAGNESIUM: 2.1 mg/dL (ref 1.7–2.4)

## 2017-06-28 MED ORDER — POTASSIUM CHLORIDE ER 10 MEQ PO TBCR: 20.0000 meq | EXTENDED_RELEASE_TABLET | Freq: Every day | ORAL | 0 refills | Status: DC

## 2017-06-28 NOTE — Discharge Instructions (Signed)
General Anesthesia, Adult, Care After °These instructions provide you with information about caring for yourself after your procedure. Your health care provider may also give you more specific instructions. Your treatment has been planned according to current medical practices, but problems sometimes occur. Call your health care provider if you have any problems or questions after your procedure. °What can I expect after the procedure? °After the procedure, it is common to have: °· Vomiting. °· A sore throat. °· Mental slowness. ° °It is common to feel: °· Nauseous. °· Cold or shivery. °· Sleepy. °· Tired. °· Sore or achy, even in parts of your body where you did not have surgery. ° °Follow these instructions at home: °For at least 24 hours after the procedure: °· Do not: °? Participate in activities where you could fall or become injured. °? Drive. °? Use heavy machinery. °? Drink alcohol. °? Take sleeping pills or medicines that cause drowsiness. °? Make important decisions or sign legal documents. °? Take care of children on your own. °· Rest. °Eating and drinking °· If you vomit, drink water, juice, or soup when you can drink without vomiting. °· Drink enough fluid to keep your urine clear or pale yellow. °· Make sure you have little or no nausea before eating solid foods. °· Follow the diet recommended by your health care provider. °General instructions °· Have a responsible adult stay with you until you are awake and alert. °· Return to your normal activities as told by your health care provider. Ask your health care provider what activities are safe for you. °· Take over-the-counter and prescription medicines only as told by your health care provider. °· If you smoke, do not smoke without supervision. °· Keep all follow-up visits as told by your health care provider. This is important. °Contact a health care provider if: °· You continue to have nausea or vomiting at home, and medicines are not helpful. °· You  cannot drink fluids or start eating again. °· You cannot urinate after 8-12 hours. °· You develop a skin rash. °· You have fever. °· You have increasing redness at the site of your procedure. °Get help right away if: °· You have difficulty breathing. °· You have chest pain. °· You have unexpected bleeding. °· You feel that you are having a life-threatening or urgent problem. °This information is not intended to replace advice given to you by your health care provider. Make sure you discuss any questions you have with your health care provider. °Document Released: 10/15/2000 Document Revised: 12/12/2015 Document Reviewed: 06/23/2015 °Elsevier Interactive Patient Education © 2018 [employer] ° °

## 2017-07-01 ENCOUNTER — Other Ambulatory Visit: Payer: Self-pay

## 2017-07-01 ENCOUNTER — Telehealth: Payer: Self-pay

## 2017-07-01 NOTE — Telephone Encounter (Signed)
Patients colonoscopy/EGD has been rescheduled from 07/02/17 to 07/09/17 with Dr. Bonna Gains at [facility] due to weather.  New instructions sent via my chart.  Thanks [employer]

## 2017-07-03 ENCOUNTER — Other Ambulatory Visit: Payer: Self-pay

## 2017-07-11 ENCOUNTER — Ambulatory Visit
Admission: RE | Admit: 2017-07-11 | Discharge: 2017-07-11 | Disposition: A | Discharge status: Home / Self Care | Payer: 59 | Source: Ambulatory Visit | Attending: Gastroenterology | Admitting: Gastroenterology

## 2017-07-11 ENCOUNTER — Encounter: Payer: Self-pay | Admitting: Anesthesiology

## 2017-07-11 ENCOUNTER — Ambulatory Visit
Admission: RE | Disposition: A | Discharge status: Home / Self Care | Payer: Self-pay | Source: Ambulatory Visit | Attending: Gastroenterology

## 2017-07-11 DIAGNOSIS — I10 Essential (primary) hypertension: Secondary | ICD-10-CM | POA: Insufficient documentation

## 2017-07-11 DIAGNOSIS — D125 Benign neoplasm of sigmoid colon: Secondary | ICD-10-CM

## 2017-07-11 DIAGNOSIS — E669 Obesity, unspecified: Secondary | ICD-10-CM | POA: Insufficient documentation

## 2017-07-11 DIAGNOSIS — F419 Anxiety disorder, unspecified: Secondary | ICD-10-CM | POA: Insufficient documentation

## 2017-07-11 DIAGNOSIS — K219 Gastro-esophageal reflux disease without esophagitis: Secondary | ICD-10-CM | POA: Insufficient documentation

## 2017-07-11 DIAGNOSIS — Z91010 Allergy to peanuts: Secondary | ICD-10-CM | POA: Insufficient documentation

## 2017-07-11 DIAGNOSIS — Z6838 Body mass index (BMI) 38.0-38.9, adult: Secondary | ICD-10-CM | POA: Diagnosis not present

## 2017-07-11 DIAGNOSIS — D509 Iron deficiency anemia, unspecified: Secondary | ICD-10-CM

## 2017-07-11 DIAGNOSIS — Z888 Allergy status to other drugs, medicaments and biological substances status: Secondary | ICD-10-CM | POA: Insufficient documentation

## 2017-07-11 DIAGNOSIS — K635 Polyp of colon: Secondary | ICD-10-CM

## 2017-07-11 DIAGNOSIS — K640 First degree hemorrhoids: Secondary | ICD-10-CM | POA: Diagnosis not present

## 2017-07-11 DIAGNOSIS — Z7982 Long term (current) use of aspirin: Secondary | ICD-10-CM | POA: Insufficient documentation

## 2017-07-11 DIAGNOSIS — Z91018 Allergy to other foods: Secondary | ICD-10-CM | POA: Insufficient documentation

## 2017-07-11 DIAGNOSIS — Z1211 Encounter for screening for malignant neoplasm of colon: Secondary | ICD-10-CM

## 2017-07-11 DIAGNOSIS — D123 Benign neoplasm of transverse colon: Secondary | ICD-10-CM

## 2017-07-11 DIAGNOSIS — Z79899 Other long term (current) drug therapy: Secondary | ICD-10-CM | POA: Diagnosis not present

## 2017-07-11 DIAGNOSIS — Z91041 Radiographic dye allergy status: Secondary | ICD-10-CM | POA: Diagnosis not present

## 2017-07-11 HISTORY — PX: COLONOSCOPY WITH PROPOFOL: SHX5780

## 2017-07-11 HISTORY — DX: Presence of spectacles and contact lenses: Z97.3

## 2017-07-11 HISTORY — PX: ESOPHAGOGASTRODUODENOSCOPY (EGD) WITH PROPOFOL: SHX5813

## 2017-07-11 LAB — GLUCOSE, CAPILLARY: GLUCOSE-CAPILLARY: 93 mg/dL (ref 65–99)

## 2017-07-11 SURGERY — COLONOSCOPY WITH PROPOFOL
Anesthesia: General | Wound class: Clean Contaminated

## 2017-07-11 MED ORDER — PROPOFOL 10 MG/ML IV BOLUS
INTRAVENOUS | Status: DC | PRN
  Administered 2017-07-11 (×4): 25 mg via INTRAVENOUS
  Administered 2017-07-11: 100 mg via INTRAVENOUS
  Administered 2017-07-11: 20 mg via INTRAVENOUS

## 2017-07-11 MED ORDER — LIDOCAINE HCL (CARDIAC) 20 MG/ML IV SOLN
INTRAVENOUS | Status: DC | PRN
  Administered 2017-07-11: 25 mg via INTRAVENOUS

## 2017-07-11 MED ORDER — SODIUM CHLORIDE 0.9 % IV SOLN: INTRAVENOUS | Status: DC

## 2017-07-11 MED ORDER — GLYCOPYRROLATE 0.2 MG/ML IJ SOLN
INTRAMUSCULAR | Status: DC | PRN
  Administered 2017-07-11: 0.2 mg via INTRAVENOUS

## 2017-07-11 MED ORDER — LACTATED RINGERS IV SOLN
INTRAVENOUS | Status: DC
  Administered 2017-07-11 (×2): via INTRAVENOUS

## 2017-07-11 SURGICAL SUPPLY — 35 items
BALLN DILATOR 10-12 8 (BALLOONS)
BALLN DILATOR 12-15 8 (BALLOONS)
BALLN DILATOR 15-18 8 (BALLOONS)
BALLN DILATOR CRE 0-12 8 (BALLOONS)
BALLN DILATOR ESOPH 8 10 CRE (MISCELLANEOUS) IMPLANT
BALLOON DILATOR 12-15 8 (BALLOONS) IMPLANT
BALLOON DILATOR 15-18 8 (BALLOONS) IMPLANT
BALLOON DILATOR CRE 0-12 8 (BALLOONS) IMPLANT
BLOCK BITE 60FR ADLT L/F GRN (MISCELLANEOUS) ×3 IMPLANT
CANISTER SUCT 1200ML W/VALVE (MISCELLANEOUS) ×3 IMPLANT
CLIP HMST 235XBRD CATH ROT (MISCELLANEOUS) IMPLANT
CLIP RESOLUTION 360 11X235 (MISCELLANEOUS)
FCP ESCP3.2XJMB 240X2.8X (MISCELLANEOUS) ×1
FORCEPS BIOP RAD 4 LRG CAP 4 (CUTTING FORCEPS) IMPLANT
FORCEPS BIOP RJ4 240 W/NDL (MISCELLANEOUS) ×2
FORCEPS ESCP3.2XJMB 240X2.8X (MISCELLANEOUS) ×1 IMPLANT
GOWN CVR UNV OPN BCK APRN NK (MISCELLANEOUS) ×2 IMPLANT
GOWN ISOL THUMB LOOP REG UNIV (MISCELLANEOUS) ×4
INJECTOR VARIJECT VIN23 (MISCELLANEOUS) IMPLANT
KIT DEFENDO VALVE AND CONN (KITS) IMPLANT
KIT ENDO PROCEDURE OLY (KITS) ×3 IMPLANT
MARKER SPOT ENDO TATTOO 5ML (MISCELLANEOUS) IMPLANT
PAD GROUND ADULT SPLIT (MISCELLANEOUS) IMPLANT
PROBE APC STR FIRE (PROBE) IMPLANT
RETRIEVER NET PLAT FOOD (MISCELLANEOUS) IMPLANT
RETRIEVER NET ROTH 2.5X230 LF (MISCELLANEOUS) IMPLANT
SNARE SHORT THROW 13M SML OVAL (MISCELLANEOUS) IMPLANT
SNARE SHORT THROW 30M LRG OVAL (MISCELLANEOUS) IMPLANT
SNARE SNG USE RND 15MM (INSTRUMENTS) IMPLANT
SPOT EX ENDOSCOPIC TATTOO (MISCELLANEOUS)
SYR INFLATION 60ML (SYRINGE) IMPLANT
TRAP ETRAP POLY (MISCELLANEOUS) IMPLANT
VARIJECT INJECTOR VIN23 (MISCELLANEOUS)
WATER STERILE IRR 250ML POUR (IV SOLUTION) ×3 IMPLANT
WIRE CRE 18-20MM 8CM F G (MISCELLANEOUS) IMPLANT

## 2017-07-11 NOTE — Anesthesia Preprocedure Evaluation (Addendum)
Anesthesia Evaluation  Patient identified by MRN, date of birth, ID band Patient awake    Reviewed: Allergy & Precautions, H&P , NPO status , Patient's Chart, lab work & pertinent test results, reviewed documented beta blocker date and time   Airway Mallampati: II  TM Distance: >3 FB Neck ROM: full    Dental no notable dental hx.    Pulmonary sleep apnea and Continuous Positive Airway Pressure Ventilation ,    Pulmonary exam normal breath sounds clear to auscultation       Cardiovascular hypertension,  Rhythm:regular Rate:Normal     Neuro/Psych  Headaches, negative psych ROS   GI/Hepatic Neg liver ROS, GERD  Medicated,  Endo/Other  negative endocrine ROS  Renal/GU negative Renal ROS  negative genitourinary   Musculoskeletal   Abdominal   Peds  Hematology  (+) anemia ,   Anesthesia Other Findings   Reproductive/Obstetrics negative OB ROS                            Anesthesia Physical Anesthesia Plan  ASA: II  Anesthesia Plan: General   Post-op Pain Management:    Induction:   PONV Risk Score and Plan: 3  Airway Management Planned:   Additional Equipment:   Intra-op Plan:   Post-operative Plan:   Informed Consent: I have reviewed the patients History and Physical, chart, labs and discussed the procedure including the risks, benefits and alternatives for the proposed anesthesia with the patient or authorized representative who has indicated his/her understanding and acceptance.     Plan Discussed with: CRNA  Anesthesia Plan Comments:         Anesthesia Quick Evaluation

## 2017-07-11 NOTE — Anesthesia Postprocedure Evaluation (Signed)
Anesthesia Post Note  Patient: Marie Vaughan  Procedure(s) Performed: COLONOSCOPY WITH PROPOFOL (N/A ) ESOPHAGOGASTRODUODENOSCOPY (EGD) WITH PROPOFOL (N/A )  Patient location during evaluation: PACU Anesthesia Type: General Level of consciousness: awake and alert Pain management: pain level controlled Vital Signs Assessment: post-procedure vital signs reviewed and stable Respiratory status: spontaneous breathing, nonlabored ventilation, respiratory function stable and patient connected to nasal cannula oxygen Cardiovascular status: blood pressure returned to baseline and stable Postop Assessment: no apparent nausea or vomiting Anesthetic complications: no    DANIEL D KOVACS

## 2017-07-11 NOTE — H&P (Signed)
[name], MD [facility] [address] [place], [place] [address] Phone:[phone] Fax : [phone]  Primary Care Physician:  Steele Sizer, MD Primary Gastroenterologist:  Dr. Allen Norris  Pre-Procedure History & Physical: HPI:  Marie Vaughan is a 46 y.o. female is here for an endoscopy and colonoscopy.   Past Medical History:  Diagnosis Date  . Acne   . Acute bronchitis   . Angio-edema   . Anxiety   . Anxiety state, unspecified   . Cervicalgia   . Contact lens/glasses fitting    wears contacts or glasses  . Dysmenorrhea   . GERD (gastroesophageal reflux disease)   . History of cardiac cath    a. 04/29/2014: LM nl, LAD no dz, D1 no dz, D2 very small vessel, D3 very small vessel, LCx nl, OM1 very small vessel, OM2 medium sized vessel, OM 3 no dz, RCA no dz, PDA no dz   . History of stress test    a. Lexiscan 08/25/13: no sig ischemia, attenuation artifact noted, no sig WMA noted, EF 69%, no EKG changes concerning for ischemia  . HTN (hypertension)   . Iron deficiency anemia secondary to blood loss (chronic)   . Migraine with aura, without mention of intractable migraine without mention of status migrainosus    "vestibular" per pt  . Obesity   . 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 Pain in thoracic spine   . Pharyngitis   . Tear of medial meniscus of left knee   . Tear of medial meniscus of right knee   . Vaginal delivery    x 3  . Wears contact lenses     Past Surgical History:  Procedure Laterality Date  . ABDOMINAL HYSTERECTOMY  04-25-10   Due to abnormal PAP  . CARDIAC CATHETERIZATION  04/2014   [facility]  . DILATION AND CURETTAGE OF UTERUS    . KNEE ARTHROSCOPY Left 06/10/2013   Procedure: LEFT KNEE ARTHROSCOPY KNEE WITH PARTIAL MEDIAL MENISCECTOMY ;  Surgeon: Lorn Junes, MD;  Location: [place];  Service: Orthopedics;  Laterality: Left;  xerofoam, 4x4's, webril, ace wrap, ice wrap  . KNEE ARTHROSCOPY WITH MEDIAL MENISECTOMY  Right 06/10/2013   Procedure: RIGHT KNEE ARTHROSCOPY WITH MEDIAL MENISECTOMY;  Surgeon: Lorn Junes, MD;  Location: [place];  Service: Orthopedics;  Laterality: Right;  partial lateral menisectomoy and chondroplasty  . OVARIAN CYST REMOVAL  04-25-10    Prior to Admission medications   Medication Sig Start Date End Date Taking? Authorizing Provider  aspirin EC 81 MG tablet Take 1 tablet (81 mg total) by mouth daily. 04/21/14  Yes Dunn, Areta Haber, PA-C  famotidine (PEPCID) 20 MG tablet Take 1 tablet (20 mg total) by mouth 2 (two) times daily. 06/17/17 06/17/18 Yes Darel Hong, MD  SUMAtriptan (IMITREX) 50 MG tablet Take by mouth. 10/10/16  Yes [provider]  topiramate (TOPAMAX) 100 MG tablet Take by mouth. 10/10/16  Yes [provider]  Cyanocobalamin (B-12) 1000 MCG SUBL Place 1 tablet under the tongue daily. Patient not taking: Reported on 03/18/2017 06/25/16   Steele Sizer, MD  cyclobenzaprine (FLEXERIL) 5 MG tablet Take 1 tablet (5 mg total) by mouth 3 (three) times daily as needed for muscle spasms. Not when driving or working Patient not taking: Reported on 06/26/2017 03/18/17   Steele Sizer, MD  Elastic Bandages & Supports (MEDICAL COMPRESSION STOCKINGS) MISC 2 Units by Does not apply route daily. 12/05/15   Steele Sizer, MD  esomeprazole (NEXIUM) 40 MG capsule Take 1 capsule (40 mg total) by mouth daily before breakfast. Patient not taking: Reported on 06/26/2017 01/05/13   Tanda Rockers, MD  gabapentin (NEURONTIN) 100 MG capsule Take 1-3 capsules (100-300 mg total) by mouth at bedtime. Increase dose every 3 days to max of 300 mg at night Patient not taking: Reported on 06/26/2017 03/18/17   Steele Sizer, MD  metFORMIN (GLUCOPHAGE-XR) 500 MG 24 hr tablet Take 1 tablet (500 mg total) by mouth daily with breakfast. Patient not taking: Reported on 06/26/2017 02/11/17   Steele Sizer, MD  Na Sulfate-K Sulfate-Mg Sulf (SUPREP BOWEL PREP KIT)  17.5-3.13-1.6 GM/177ML SOLN Take 1 kit by mouth as directed. 06/26/17   Virgel Manifold, MD  potassium chloride (K-DUR) 10 MEQ tablet Take 2 tablets (20 mEq total) by mouth daily for 14 days. 06/28/17 07/12/17  Virgel Manifold, MD  traMADol (ULTRAM) 50 MG tablet Take 1 tablet (50 mg total) by mouth every 6 (six) hours as needed. Patient not taking: Reported on 06/27/2017 03/16/17 03/16/18  Nance Pear, MD  traZODone (DESYREL) 50 MG tablet Take 0.5-1 tablets (25-50 mg total) by mouth at bedtime as needed for sleep. Patient not taking: Reported on 06/26/2017 02/11/17   Steele Sizer, MD    Allergies as of 06/26/2017 - Review Complete 06/26/2017  Allergen Reaction Noted  . Chocolate  10/27/2013  . Chocolate flavor Other (See Comments) 10/27/2013  . Iodinated diagnostic agents    . Iohexol  01/05/2013  . Peanut-containing drug products Swelling 06/08/2013  . Pineapple  06/17/2017    Family History  Problem Relation Age of Onset  . Early death Mother        Murdered when pt was 58 years old  . Diabetes Mother   . Hypertension Mother   . Gout Mother   . Kidney failure Mother   . Early death Sister 29       due to hemorage  . Alcohol abuse Maternal Uncle   . Alcohol abuse Other   . Arthritis Other   . Heart disease Other   . Other Other        cousin- phlebitis  . Diabetes Maternal Aunt   . Hyperlipidemia Maternal Aunt   . Hypertension Maternal Aunt   . Prostate cancer Maternal Uncle   . Breast cancer Neg Hx     Social History   Socioeconomic History  . Marital status: Married    Spouse name: Not on file  . Number of children: 3  . Years of education: Not on file  . Highest education level: Not on file  Social Needs  . Financial resource strain: Not on file  . Food insecurity - worry: Not on file  . Food insecurity - inability: Not on file  . Transportation needs - medical: Not on file  . Transportation needs - non-medical: Not on file  Occupational History    . Occupation: Proofreader: [employer]  Tobacco Use  . Smoking status: Never Smoker  . Smokeless tobacco: Never Used  Substance and Sexual Activity  . Alcohol use: No    Alcohol/week: 0.0 oz  . Drug use: No  . Sexual activity: Yes    Partners: Male  Other Topics Concern  . Not on file  Social History Narrative   Lives in [place] with husband, two children gone . Daughter's are grown and out of the house. Her 47 yo is going to college  Review of Systems: See HPI, otherwise negative ROS  Physical Exam: BP 130/76   Pulse 96   Temp 98.1 F (36.7 C)   Ht _0  (1.575 m)   Wt 211 lb (95.7 kg)   SpO2 99%   BMI 38.59 kg/m  General:   Alert,  pleasant and cooperative in NAD Head:  Normocephalic and atraumatic. Neck:  Supple; no masses or thyromegaly. Lungs:  Clear throughout to auscultation.    Heart:  Regular rate and rhythm. Abdomen:  Soft, nontender and nondistended. Normal bowel sounds, without guarding, and without rebound.   Neurologic:  Alert and  oriented x4;  grossly normal neurologically.  Impression/Plan: Marie Vaughan is here for an endoscopy and colonoscopy to be performed for Screening and anemia  Risks, benefits, limitations, and alternatives regarding  endoscopy and colonoscopy have been reviewed with the patient.  Questions have been answered.  All parties agreeable.   [name], MD  07/11/2017, 7:52 AM

## 2017-07-11 NOTE — Op Note (Addendum)
[facility] Gastroenterology Patient Name: Marie Vaughan Procedure Date: 07/11/2017 8:04 AM MRN: [id number] Account #: [id number] Date of Birth: 1971/01/02 Admit Type: Outpatient Age: 46 Room: [facility] OR ROOM 01 Gender: Female Note Status: Supervisor Override Procedure:            Upper GI endoscopy Indications:          Iron deficiency anemia Providers:            [name] MD, MD Referring MD:         Bethena Roys. Sowles, MD (Referring MD) Medicines:            Propofol per Anesthesia Complications:        No immediate complications. Procedure:            Pre-Anesthesia Assessment:                       - Prior to the procedure, a History and Physical was                        performed, and patient medications and allergies were                        reviewed. The patient's tolerance of previous                        anesthesia was also reviewed. The risks and benefits of                        the procedure and the sedation options and risks were                        discussed with the patient. All questions were                        answered, and informed consent was obtained. Prior                        Anticoagulants: The patient has taken no previous                        anticoagulant or antiplatelet agents. ASA Grade                        Assessment: II - A patient with mild systemic disease.                        After reviewing the risks and benefits, the patient was                        deemed in satisfactory condition to undergo the                        procedure.                       After obtaining informed consent, the endoscope was                        passed under direct vision. Throughout the procedure,  the patient's blood pressure, pulse, and oxygen                        saturations were monitored continuously. The Olympus                        GIF H180J Endoscope (S#: [id number]) was introduced             through the mouth, and advanced to the second part of                        duodenum. The upper GI endoscopy was accomplished                        without difficulty. The patient tolerated the procedure                        well. Findings:      The examined esophagus was normal.      The entire examined stomach was normal.      The examined duodenum was normal. Impression:           - Normal esophagus.                       - Normal stomach.                       - Normal examined duodenum.                       - No specimens collected. Recommendation:       - Discharge patient to home.                       - Resume previous diet.                       - Continue present medications. Procedure Code(s):    --- Professional ---                       [phone], Esophagogastroduodenoscopy, flexible, transoral;                        diagnostic, including collection of specimen(s) by                        brushing or washing, when performed (separate procedure) Diagnosis Code(s):    --- Professional ---                       D50.9, Iron deficiency anemia, unspecified CPT copyright 2018 American Medical Association. All rights reserved. The codes documented in this report are preliminary and upon coder review may  be revised to meet current compliance requirements. [name] MD, MD 07/11/2017 8:14:11 AM This report has been signed electronically. Number of Addenda: 0 Note Initiated On: 07/11/2017 8:04 AM      [facility]

## 2017-07-11 NOTE — Anesthesia Procedure Notes (Signed)
Procedure Name: MAC Performed by: Lind Guest, CRNA Pre-anesthesia Checklist: Patient identified, Emergency Drugs available, Suction available, Patient being monitored and Timeout performed Patient Re-evaluated:Patient Re-evaluated prior to induction Oxygen Delivery Method: Nasal cannula Preoxygenation: Pre-oxygenation with 100% oxygen

## 2017-07-11 NOTE — Transfer of Care (Signed)
Immediate Anesthesia Transfer of Care Note  Patient: Marie Vaughan  Procedure(s) Performed: COLONOSCOPY WITH PROPOFOL (N/A ) ESOPHAGOGASTRODUODENOSCOPY (EGD) WITH PROPOFOL (N/A )  Patient Location: PACU  Anesthesia Type: General  Level of Consciousness: awake, alert  and patient cooperative  Airway and Oxygen Therapy: Patient Spontanous Breathing and Patient connected to supplemental oxygen  Post-op Assessment: Post-op Vital signs reviewed, Patient's Cardiovascular Status Stable, Respiratory Function Stable, Patent Airway and No signs of Nausea or vomiting  Post-op Vital Signs: Reviewed and stable  Complications: No apparent anesthesia complications

## 2017-07-11 NOTE — Op Note (Signed)
[facility] Gastroenterology Patient Name: Marie Vaughan Procedure Date: 07/11/2017 8:05 AM MRN: [id number] Account #: [id number] Date of Birth: 02/26/1971 Admit Type: Outpatient Age: 46 Room: [facility] OR ROOM 01 Gender: Female Note Status: Finalized Procedure:            Colonoscopy Indications:          Screening for colorectal malignant neoplasm Providers:            [name] MD, MD Referring MD:         Bethena Roys. Sowles, MD (Referring MD) Medicines:            Propofol per Anesthesia Complications:        No immediate complications. Procedure:            Pre-Anesthesia Assessment:                       - Prior to the procedure, a History and Physical was                        performed, and patient medications and allergies were                        reviewed. The patient's tolerance of previous                        anesthesia was also reviewed. The risks and benefits of                        the procedure and the sedation options and risks were                        discussed with the patient. All questions were                        answered, and informed consent was obtained. Prior                        Anticoagulants: The patient has taken no previous                        anticoagulant or antiplatelet agents. ASA Grade                        Assessment: II - A patient with mild systemic disease.                        After reviewing the risks and benefits, the patient was                        deemed in satisfactory condition to undergo the                        procedure.                       After obtaining informed consent, the colonoscope was                        passed under direct vision. Throughout the procedure,  the patient's blood pressure, pulse, and oxygen                        saturations were monitored continuously. The [place] [id number]) was introduced through the                         anus and advanced to the the cecum, identified by                        appendiceal orifice and ileocecal valve. The                        colonoscopy was performed without difficulty. The                        patient tolerated the procedure well. The quality of                        the bowel preparation was excellent. Findings:      The perianal and digital rectal examinations were normal.      A 3 mm polyp was found in the transverse colon. The polyp was sessile.       The polyp was removed with a cold biopsy forceps. Resection and       retrieval were complete.      A 2 mm polyp was found in the sigmoid colon. The polyp was sessile. The       polyp was removed with a cold biopsy forceps. Resection and retrieval       were complete.      Non-bleeding internal hemorrhoids were found during retroflexion. The       hemorrhoids were Grade I (internal hemorrhoids that do not prolapse). Impression:           - One 3 mm polyp in the transverse colon, removed with                        a cold biopsy forceps. Resected and retrieved.                       - One 2 mm polyp in the sigmoid colon, removed with a                        cold biopsy forceps. Resected and retrieved.                       - Non-bleeding internal hemorrhoids. Recommendation:       - Discharge patient to home.                       - Resume previous diet.                       - Continue present medications.                       - Await pathology results.                       -  Repeat colonoscopy in 5 years if polyp adenoma and 10                        years if hyperplastic Procedure Code(s):    --- Professional ---                       [phone], Colonoscopy, flexible; with biopsy, single or                        multiple Diagnosis Code(s):    --- Professional ---                       Z12.11, Encounter for screening for malignant neoplasm                        of colon                        D12.3, Benign neoplasm of transverse colon (hepatic                        flexure or splenic flexure)                       D12.5, Benign neoplasm of sigmoid colon CPT copyright 2016 American Medical Association. All rights reserved. The codes documented in this report are preliminary and upon coder review may  be revised to meet current compliance requirements. [name] MD, MD 07/11/2017 8:30:22 AM This report has been signed electronically. Number of Addenda: 0 Note Initiated On: 07/11/2017 8:05 AM Scope Withdrawal Time: 0 hours 8 minutes 48 seconds  Total Procedure Duration: 0 hours 11 minutes 30 seconds       [facility]

## 2017-07-12 ENCOUNTER — Encounter: Payer: Self-pay | Admitting: Gastroenterology

## 2017-07-15 ENCOUNTER — Encounter: Payer: Self-pay | Admitting: Gastroenterology

## 2017-07-17 ENCOUNTER — Encounter: Payer: Self-pay | Admitting: Gastroenterology

## 2017-07-19 ENCOUNTER — Other Ambulatory Visit: Payer: Self-pay | Admitting: *Deleted

## 2017-07-19 ENCOUNTER — Other Ambulatory Visit: Payer: Self-pay

## 2017-07-19 ENCOUNTER — Inpatient Hospital Stay: Payer: 59

## 2017-07-19 ENCOUNTER — Inpatient Hospital Stay: Payer: 59 | Attending: Oncology | Admitting: Oncology

## 2017-07-19 ENCOUNTER — Encounter: Payer: Self-pay | Admitting: Oncology

## 2017-07-19 VITALS — BP 153/104 | HR 85 | Temp 98.3°F | Resp 20 | Wt 219.0 lb

## 2017-07-19 DIAGNOSIS — N946 Dysmenorrhea, unspecified: Secondary | ICD-10-CM | POA: Diagnosis not present

## 2017-07-19 DIAGNOSIS — D509 Iron deficiency anemia, unspecified: Secondary | ICD-10-CM | POA: Insufficient documentation

## 2017-07-19 DIAGNOSIS — K219 Gastro-esophageal reflux disease without esophagitis: Secondary | ICD-10-CM | POA: Diagnosis not present

## 2017-07-19 DIAGNOSIS — D125 Benign neoplasm of sigmoid colon: Secondary | ICD-10-CM | POA: Diagnosis not present

## 2017-07-19 DIAGNOSIS — G4733 Obstructive sleep apnea (adult) (pediatric): Secondary | ICD-10-CM | POA: Diagnosis not present

## 2017-07-19 DIAGNOSIS — I10 Essential (primary) hypertension: Secondary | ICD-10-CM | POA: Insufficient documentation

## 2017-07-19 DIAGNOSIS — D123 Benign neoplasm of transverse colon: Secondary | ICD-10-CM | POA: Diagnosis not present

## 2017-07-19 DIAGNOSIS — E669 Obesity, unspecified: Secondary | ICD-10-CM | POA: Insufficient documentation

## 2017-07-19 DIAGNOSIS — G43109 Migraine with aura, not intractable, without status migrainosus: Secondary | ICD-10-CM | POA: Diagnosis not present

## 2017-07-19 DIAGNOSIS — Z79899 Other long term (current) drug therapy: Secondary | ICD-10-CM | POA: Diagnosis not present

## 2017-07-19 DIAGNOSIS — Z90710 Acquired absence of both cervix and uterus: Secondary | ICD-10-CM | POA: Diagnosis not present

## 2017-07-19 LAB — URINALYSIS, COMPLETE (UACMP) WITH MICROSCOPIC
BACTERIA UA: NONE SEEN
Bilirubin Urine: NEGATIVE
GLUCOSE, UA: NEGATIVE mg/dL
Hgb urine dipstick: NEGATIVE
KETONES UR: NEGATIVE mg/dL
Leukocytes, UA: NEGATIVE
Nitrite: NEGATIVE
PROTEIN: NEGATIVE mg/dL
Specific Gravity, Urine: 1.019 (ref 1.005–1.030)
pH: 6 (ref 5.0–8.0)

## 2017-07-19 LAB — CBC WITH DIFFERENTIAL/PLATELET
BASOS ABS: 0 10*3/uL (ref 0–0.1)
Basophils Relative: 0 %
Eosinophils Absolute: 0.1 10*3/uL (ref 0–0.7)
Eosinophils Relative: 1 %
HEMATOCRIT: 39.8 % (ref 35.0–47.0)
Hemoglobin: 12.9 g/dL (ref 12.0–16.0)
LYMPHS ABS: 2.6 10*3/uL (ref 1.0–3.6)
LYMPHS PCT: 36 %
MCH: 25.6 pg — AB (ref 26.0–34.0)
MCHC: 32.3 g/dL (ref 32.0–36.0)
MCV: 79.1 fL — AB (ref 80.0–100.0)
MONO ABS: 0.6 10*3/uL (ref 0.2–0.9)
Monocytes Relative: 8 %
NEUTROS ABS: 3.9 10*3/uL (ref 1.4–6.5)
Neutrophils Relative %: 55 %
Platelets: 333 10*3/uL (ref 150–440)
RBC: 5.03 MIL/uL (ref 3.80–5.20)
RDW: 15.5 % — ABNORMAL HIGH (ref 11.5–14.5)
WBC: 7.1 10*3/uL (ref 3.6–11.0)

## 2017-07-19 LAB — FERRITIN: FERRITIN: 25 ng/mL (ref 11–307)

## 2017-07-19 LAB — VITAMIN B12: Vitamin B-12: 256 pg/mL (ref 180–914)

## 2017-07-19 LAB — IRON AND TIBC
IRON: 38 ug/dL (ref 28–170)
SATURATION RATIOS: 9 % — AB (ref 10.4–31.8)
TIBC: 445 ug/dL (ref 250–450)
UIBC: 407 ug/dL

## 2017-07-19 LAB — FOLATE: Folate: 8 ng/mL (ref >5.9)

## 2017-07-19 LAB — RETICULOCYTES
RBC.: 4.98 MIL/uL (ref 3.80–5.20)
RETIC CT PCT: 1.3 % (ref 0.4–3.1)
Retic Count, Absolute: 64.7 10*3/uL (ref 19.0–183.0)

## 2017-07-19 NOTE — Progress Notes (Signed)
Hematology/Oncology Consult note [facility] Telephone:([phone] [phone]  Patient Care Team: Steele Sizer, MD as PCP - General (Family Medicine)   Name of the patient: Marie Vaughan  [id number]  Oct 03, 1970    Reason for referral- iron deficiency anemia   Referring physician- [facility]  Date of visit: 07/19/17   History of presenting illness-patient is a 46 year old female who was seen by GI for symptoms of chronic heartburn and abdominal pain and was noted to have microcytic anemia.  Plan is to get an EGD and colonoscopy from their side.  Patient has had a hysterectomy for menorrhagia in the past.  Recent CBC from 06/17/2017 showed white count of 7.1, H&H of 12.7/38.7 with an MCV of 78.8 and a platelet count of 291.  Of note her hemoglobin has been between 12-12.7 over the last 1 year.  Ferritin levels were low normal at 29.With iron study showed a low serum iron of 22, elevated TIBC of 459 and a low iron saturation of 5%.  B12 levels checked back in July 2018 were normal at 310.  Patient underwent EGD and colonoscopy on 07/11/2017.  EGD which showed no abnormal findings.  Colonoscopy showed nonbleeding internal hemorrhoids and 2 polyps in the transverse and sigmoid colon respectively which were resected and showed tubular adenoma and hyperplastic polyp respectively  Currently patient reports doing well.  She has had right upper quadrant abdominal pain on and off.  She did undergo a CT abdomen and pelvis as well in August 2018 which did not reveal any acute pathology.  This was followed by a HIDA scan which was normal.  Patient is currently not taking any oral iron and has not tried oral iron in the past as well  ECOG PS- 0  Pain scale- 0   Review of systems- Review of Systems  Constitutional: Negative for chills, fever, malaise/fatigue and weight loss.  HENT: Negative for congestion, ear discharge and nosebleeds.   Eyes: Negative for blurred  vision.  Respiratory: Negative for cough, hemoptysis, sputum production, shortness of breath and wheezing.   Cardiovascular: Negative for chest pain, palpitations, orthopnea and claudication.  Gastrointestinal: Negative for abdominal pain, blood in stool, constipation, diarrhea, heartburn, melena, nausea and vomiting.  Genitourinary: Negative for dysuria, flank pain, frequency, hematuria and urgency.  Musculoskeletal: Negative for back pain, joint pain and myalgias.  Skin: Negative for rash.  Neurological: Negative for dizziness, tingling, focal weakness, seizures, weakness and headaches.  Endo/Heme/Allergies: Does not bruise/bleed easily.  Psychiatric/Behavioral: Negative for depression and suicidal ideas. The patient does not have insomnia.     Allergies  Allergen Reactions  . Iodinated Diagnostic Agents Shortness Of Breath  . Chocolate     Hives, itching  . Chocolate Flavor Other (See Comments)    Hives, itching  . Iohexol     CP and SOB  . Peanut-Containing Drug Products Swelling    cashews  . Pineapple     Patient Active Problem List   Diagnosis Date Noted  . Colon cancer screening   . Polyp of sigmoid colon   . Benign neoplasm of transverse colon   . Iron deficiency anemia   . Metabolic syndrome 48/18/5631  . OSA on CPAP 02/03/2016  . 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 Abnormal brain MRI 12/20/2015  . Dizziness 12/13/2015  . Near syncope 12/10/2015  . Atypical chest pain 08/22/2013  . Essential hypertension 08/22/2013  . Angio-edema   . GERD (gastroesophageal reflux disease)   . Migraine with aura and  without status migrainosus   . Acne   . Anxiety   . Allergic rhinitis 10/05/2011  . Insomnia 08/30/2011  . Obesity 08/30/2011     Past Medical History:  Diagnosis Date  . Acne   . Acute bronchitis   . Angio-edema   . Anxiety   . Anxiety state, unspecified   . Cervicalgia   . Contact lens/glasses fitting    wears contacts or glasses  . Dysmenorrhea   .  GERD (gastroesophageal reflux disease)   . History of cardiac cath    a. 04/29/2014: LM nl, LAD no dz, D1 no dz, D2 very small vessel, D3 very small vessel, LCx nl, OM1 very small vessel, OM2 medium sized vessel, OM 3 no dz, RCA no dz, PDA no dz   . History of stress test    a. Lexiscan 08/25/13: no sig ischemia, attenuation artifact noted, no sig WMA noted, EF 69%, no EKG changes concerning for ischemia  . HTN (hypertension)   . Iron deficiency anemia secondary to blood loss (chronic)   . Migraine with aura, without mention of intractable migraine without mention of status migrainosus    "vestibular" per pt  . Obesity   . 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 Pain in thoracic spine   . Pharyngitis   . Tear of medial meniscus of left knee   . Tear of medial meniscus of right knee   . Vaginal delivery    x 3  . Wears contact lenses      Past Surgical History:  Procedure Laterality Date  . ABDOMINAL HYSTERECTOMY  04-25-10   Due to abnormal PAP  . CARDIAC CATHETERIZATION  04/2014   [facility]  . COLONOSCOPY WITH PROPOFOL N/A 07/11/2017   Procedure: COLONOSCOPY WITH PROPOFOL;  Surgeon: [name], MD;  Location: [place];  Service: Endoscopy;  Laterality: N/A;  . DILATION AND CURETTAGE OF UTERUS    . ESOPHAGOGASTRODUODENOSCOPY (EGD) WITH PROPOFOL N/A 07/11/2017   Procedure: ESOPHAGOGASTRODUODENOSCOPY (EGD) WITH PROPOFOL;  Surgeon: [name], MD;  Location: [place];  Service: Endoscopy;  Laterality: N/A;  diabetic - diet controlled  . KNEE ARTHROSCOPY Left 06/10/2013   Procedure: LEFT KNEE ARTHROSCOPY KNEE WITH PARTIAL MEDIAL MENISCECTOMY ;  Surgeon: Lorn Junes, MD;  Location: [place];  Service: Orthopedics;  Laterality: Left;  xerofoam, 4x4's, webril, ace wrap, ice wrap  . KNEE ARTHROSCOPY WITH MEDIAL MENISECTOMY Right 06/10/2013   Procedure: RIGHT KNEE ARTHROSCOPY WITH MEDIAL MENISECTOMY;  Surgeon: Lorn Junes, MD;  Location:  [place];  Service: Orthopedics;  Laterality: Right;  partial lateral menisectomoy and chondroplasty  . OVARIAN CYST REMOVAL  04-25-10    Social History   Socioeconomic History  . Marital status: Married    Spouse name: Not on file  . Number of children: 3  . Years of education: Not on file  . Highest education level: Not on file  Social Needs  . Financial resource strain: Not on file  . Food insecurity - worry: Not on file  . Food insecurity - inability: Not on file  . Transportation needs - medical: Not on file  . Transportation needs - non-medical: Not on file  Occupational History  . Occupation: Proofreader: [employer]  Tobacco Use  . Smoking status: Never Smoker  . Smokeless tobacco: Never Used  Substance and Sexual Activity  . Alcohol use: No    Alcohol/week: 0.0 oz  . Drug  use: No  . Sexual activity: Yes    Partners: Male  Other Topics Concern  . Not on file  Social History Narrative   Lives in [place] with husband, two children gone . Daughter's are grown and out of the house. Her 47 yo is going to college              Family History  Problem Relation Age of Onset  . Early death Mother        Murdered when pt was 18 years old  . Diabetes Mother   . Hypertension Mother   . Gout Mother   . Kidney failure Mother   . Early death Sister 53       due to hemorage  . Alcohol abuse Maternal Uncle   . Alcohol abuse Other   . Arthritis Other   . Heart disease Other   . Other Other        cousin- phlebitis  . Diabetes Maternal Aunt   . Hyperlipidemia Maternal Aunt   . Hypertension Maternal Aunt   . Prostate cancer Maternal Uncle   . Breast cancer Neg Hx      Current Outpatient Medications:  .  aspirin EC 81 MG tablet, Take 1 tablet (81 mg total) by mouth daily., Disp: 90 tablet, Rfl: 3 .  Cyanocobalamin (B-12) 1000 MCG SUBL, Place 1 tablet under the tongue daily. (Patient not taking: Reported on 03/18/2017), Disp: 30  each, Rfl: 0 .  cyclobenzaprine (FLEXERIL) 5 MG tablet, Take 1 tablet (5 mg total) by mouth 3 (three) times daily as needed for muscle spasms. Not when driving or working (Patient not taking: Reported on 06/26/2017), Disp: 30 tablet, Rfl: 0 .  Elastic Bandages & Supports (MEDICAL COMPRESSION STOCKINGS) MISC, 2 Units by Does not apply route daily., Disp: 2 each, Rfl: 0 .  esomeprazole (NEXIUM) 40 MG capsule, Take 1 capsule (40 mg total) by mouth daily before breakfast. (Patient not taking: Reported on 06/26/2017), Disp: , Rfl:  .  famotidine (PEPCID) 20 MG tablet, Take 1 tablet (20 mg total) by mouth 2 (two) times daily., Disp: 60 tablet, Rfl: 0 .  gabapentin (NEURONTIN) 100 MG capsule, Take 1-3 capsules (100-300 mg total) by mouth at bedtime. Increase dose every 3 days to max of 300 mg at night (Patient not taking: Reported on 06/26/2017), Disp: 90 capsule, Rfl: 0 .  metFORMIN (GLUCOPHAGE-XR) 500 MG 24 hr tablet, Take 1 tablet (500 mg total) by mouth daily with breakfast. (Patient not taking: Reported on 06/26/2017), Disp: 30 tablet, Rfl: 0 .  Na Sulfate-K Sulfate-Mg Sulf (SUPREP BOWEL PREP KIT) 17.5-3.13-1.6 GM/177ML SOLN, Take 1 kit by mouth as directed., Disp: 1 Bottle, Rfl: 0 .  potassium chloride (K-DUR) 10 MEQ tablet, Take 2 tablets (20 mEq total) by mouth daily for 14 days., Disp: 28 tablet, Rfl: 0 .  SUMAtriptan (IMITREX) 50 MG tablet, Take by mouth., Disp: , Rfl:  .  topiramate (TOPAMAX) 100 MG tablet, Take by mouth., Disp: , Rfl:  .  traMADol (ULTRAM) 50 MG tablet, Take 1 tablet (50 mg total) by mouth every 6 (six) hours as needed. (Patient not taking: Reported on 06/27/2017), Disp: 15 tablet, Rfl: 0 .  traZODone (DESYREL) 50 MG tablet, Take 0.5-1 tablets (25-50 mg total) by mouth at bedtime as needed for sleep. (Patient not taking: Reported on 06/26/2017), Disp: 30 tablet, Rfl: 0   Physical exam:  Vitals:   07/19/17 1351  BP: (!) 161/99  Pulse: 85  Resp: 20  Temp: 98.3 F (36.8 C)    TempSrc: Tympanic  Weight: 219 lb (99.3 kg)   Physical Exam  Constitutional: She is oriented to person, place, and time and well-developed, well-nourished, and in no distress.  HENT:  Head: Normocephalic and atraumatic.  Eyes: EOM are normal. Pupils are equal, round, and reactive to light.  Neck: Normal range of motion.  Cardiovascular: Normal rate, regular rhythm and normal heart sounds.  Pulmonary/Chest: Effort normal and breath sounds normal.  Abdominal: Soft. Bowel sounds are normal.  Neurological: She is alert and oriented to person, place, and time.  Skin: Skin is warm and dry.       CMP Latest Ref Rng & Units 06/28/2017  Glucose 65 - 99 mg/dL 108(H)  BUN 6 - 20 mg/dL 8  Creatinine 0.44 - 1.00 mg/dL 0.62  Sodium 135 - 145 mmol/L 138  Potassium 3.5 - 5.1 mmol/L 3.1(L)  Chloride 101 - 111 mmol/L 106  CO2 22 - 32 mmol/L 24  Calcium 8.9 - 10.3 mg/dL 9.2  Total Protein 6.5 - 8.1 g/dL -  Total Bilirubin 0.3 - 1.2 mg/dL -  Alkaline Phos 38 - 126 U/L -  AST 15 - 41 U/L -  ALT 14 - 54 U/L -   CBC Latest Ref Rng & Units 06/17/2017  WBC 3.6 - 11.0 K/uL 7.1  Hemoglobin 12.0 - 16.0 g/dL 12.7  Hematocrit 35.0 - 47.0 % 38.7  Platelets 150 - 440 K/uL 291     Assessment and plan- Patient is a 46 y.o. female referred for mild iron deficiency anemia  Today I will repeat a CBC with differential along with iron studies B12 folate reticulocyte count and urinalysis.  Patient is already seen by GI and EGD and colonoscopy did not reveal any source of bleeding.  Given her iron deficiency anemia perhaps a capsule endoscopy can be considered by GI if  deemed appropriate.    Since patient has not tried oral iron before and is only mildly anemic, I would like her to try oral iron 325 mg twice daily every other day.  If she has any trouble tolerating oral iron I will switch her to IV iron at that time.  Discussed risks and benefits of IV iron Feraheme 510 mg weekly x2 including all but not  limited to headaches, fatigue, leg swelling and risk of infusion reaction.  Patient understands and would like to proceed with IV iron only if she is unable to tolerate oral iron  I will see her back in 2 months time with CBC with differential and iron studies   Thank you for this kind referral and the opportunity to participate in the care of this patient   Visit Diagnosis 1. Iron deficiency anemia, unspecified iron deficiency anemia type     Dr. Randa Evens, MD, MPH [place] [facility] Pager- [phone] 07/19/2017  2:16 PM

## 2017-07-22 ENCOUNTER — Telehealth: Payer: Self-pay | Admitting: *Deleted

## 2017-07-22 NOTE — Telephone Encounter (Signed)
Called pt to schedule an appt. Pt refused to come in to see Raquel Sarna, she wanted to come in next week. Pt was scheduled with Dr Ancil Boozer for 07/30/2017

## 2017-07-22 NOTE — Telephone Encounter (Signed)
-----   Message from Steele Sizer, MD sent at 07/22/2017  8:40 AM EST ----- Regarding: FW: Elevated BP/needs follow up   ----- Message ----- From: Livia Snellen, RN Sent: 07/19/2017   2:28 PM To: Steele Sizer, MD, Sindy Guadeloupe, MD Subject: Elevated BP                                    This email is being sent per Dr. Elroy Channel request. This patient was in our clinic today for evaluation of low iron levels. Her BP was checked 3 different times and was elevated each time. The patient stated that she woke up with a headache this morning, but did not take any medication for it. She also stated that she has not had elevated BP in the past. (161/99, 151/103, 153/104)  Thank you for your attention to this matter, Phylliss Bob, RN

## 2017-07-22 NOTE — Telephone Encounter (Signed)
Per Dr. Elroy Channel request, called patient and instructed her to take oral vitamin B12 1069mcg. Daily. Patient verbalized understanding.     dhs

## 2017-07-30 ENCOUNTER — Ambulatory Visit: Payer: 59 | Admitting: Family Medicine

## 2017-08-01 ENCOUNTER — Encounter: Payer: Self-pay | Admitting: Family Medicine

## 2017-08-01 ENCOUNTER — Ambulatory Visit (INDEPENDENT_AMBULATORY_CARE_PROVIDER_SITE_OTHER): Payer: 59 | Admitting: Family Medicine

## 2017-08-01 VITALS — BP 120/80 | HR 89 | Resp 14 | Ht 62.0 in | Wt 220.4 lb

## 2017-08-01 DIAGNOSIS — G43809 Other migraine, not intractable, without status migrainosus: Secondary | ICD-10-CM

## 2017-08-01 DIAGNOSIS — E876 Hypokalemia: Secondary | ICD-10-CM

## 2017-08-01 DIAGNOSIS — Z9989 Dependence on other enabling machines and devices: Secondary | ICD-10-CM | POA: Diagnosis not present

## 2017-08-01 DIAGNOSIS — G4733 Obstructive sleep apnea (adult) (pediatric): Secondary | ICD-10-CM | POA: Diagnosis not present

## 2017-08-01 DIAGNOSIS — E785 Hyperlipidemia, unspecified: Secondary | ICD-10-CM

## 2017-08-01 DIAGNOSIS — E538 Deficiency of other specified B group vitamins: Secondary | ICD-10-CM | POA: Diagnosis not present

## 2017-08-01 DIAGNOSIS — E8881 Metabolic syndrome: Secondary | ICD-10-CM

## 2017-08-01 DIAGNOSIS — K219 Gastro-esophageal reflux disease without esophagitis: Secondary | ICD-10-CM | POA: Diagnosis not present

## 2017-08-01 DIAGNOSIS — G43109 Migraine with aura, not intractable, without status migrainosus: Secondary | ICD-10-CM | POA: Diagnosis not present

## 2017-08-01 DIAGNOSIS — I10 Essential (primary) hypertension: Secondary | ICD-10-CM

## 2017-08-01 MED ORDER — B-12 1000 MCG SL SUBL: 1.0000 | SUBLINGUAL_TABLET | Freq: Every day | SUBLINGUAL | 1 refills | Status: DC

## 2017-08-01 NOTE — Progress Notes (Signed)
Name: Marie Vaughan   MRN: [id number]    DOB: 23-Feb-1971   Date:08/01/2017       Progress Note  Subjective  Chief Complaint  Chief Complaint  Patient presents with  . Hypertension    HPI   HTN: she has not been taking HCTZ for over 6 months, she went to [facility] Dec with a bp that was 153/104, but since than bp is back to normal She denieschest pain, or palpitation.   Migraine/vestibular: She is okay on Topamax 100 mg  occasionally, not very compliant lately, seeing Dr. Manuella Ghazi. Given Imitrex but gets very groggy on it. She states episodes are not as frequent but still severe when present. She has severe dizziness with episodes, nausea, but no vomiting, she has not missed work because of it.   OSA: moderate to severe based on sleep study, started on CPAP at 9 cm H2O July 2017. She has been using CPAP but only once every couple of weeks , when she has a headache when she goes to bed. Advised to use CPAP every night  Insomnia:  Goes to bed late , playing games on her phone - she is avoiding playing video games while in bed,  discussed sleep hygiene , not taking medication at this time for sleep.  Metabolic syndrome: she denies polyphagia, polyuria or polydipsia, last HDL 46, history of HTN and increase in abdominal girth.   Obesity: she is trying to eat healthy, wants to go back on Phentermine but advised to try Metformin first and consider GLP-1 agonist. Discussed importance of increasing physical activity. She has not been able to stop sodas, drinking 2 liters a day.    Patient Active Problem List   Diagnosis Date Noted  . Colon cancer screening   . Polyp of sigmoid colon   . Benign neoplasm of transverse colon   . Iron deficiency anemia   . Metabolic syndrome 97/67/3419  . OSA on CPAP 02/03/2016  . 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 Abnormal brain MRI 12/20/2015  . Dizziness 12/13/2015  . Near syncope 12/10/2015  . Atypical chest pain 08/22/2013  . Essential hypertension  08/22/2013  . Angio-edema   . GERD (gastroesophageal reflux disease)   . Migraine with aura and without status migrainosus   . Acne   . Anxiety   . Allergic rhinitis 10/05/2011  . Insomnia 08/30/2011  . Obesity 08/30/2011    Past Surgical History:  Procedure Laterality Date  . ABDOMINAL HYSTERECTOMY  04-25-10   Due to abnormal PAP  . CARDIAC CATHETERIZATION  04/2014   [facility]  . COLONOSCOPY WITH PROPOFOL N/A 07/11/2017   Procedure: COLONOSCOPY WITH PROPOFOL;  Surgeon: [name], MD;  Location: [place];  Service: Endoscopy;  Laterality: N/A;  . DILATION AND CURETTAGE OF UTERUS    . ESOPHAGOGASTRODUODENOSCOPY (EGD) WITH PROPOFOL N/A 07/11/2017   Procedure: ESOPHAGOGASTRODUODENOSCOPY (EGD) WITH PROPOFOL;  Surgeon: [name], MD;  Location: [place];  Service: Endoscopy;  Laterality: N/A;  diabetic - diet controlled  . KNEE ARTHROSCOPY Left 06/10/2013   Procedure: LEFT KNEE ARTHROSCOPY KNEE WITH PARTIAL MEDIAL MENISCECTOMY ;  Surgeon: Lorn Junes, MD;  Location: [place];  Service: Orthopedics;  Laterality: Left;  xerofoam, 4x4's, webril, ace wrap, ice wrap  . KNEE ARTHROSCOPY WITH MEDIAL MENISECTOMY Right 06/10/2013   Procedure: RIGHT KNEE ARTHROSCOPY WITH MEDIAL MENISECTOMY;  Surgeon: Lorn Junes, MD;  Location: [place];  Service: Orthopedics;  Laterality: Right;  partial lateral menisectomoy and chondroplasty  .  OVARIAN CYST REMOVAL  04-25-10    Family History  Problem Relation Age of Onset  . Early death Mother        Murdered when pt was 41 years old  . Diabetes Mother   . Hypertension Mother   . Gout Mother   . Kidney failure Mother   . Early death Sister 64       due to hemorage  . Alcohol abuse Maternal Uncle   . Alcohol abuse Other   . Arthritis Other   . Heart disease Other   . Other Other        cousin- phlebitis  . Diabetes Maternal Aunt   . Hyperlipidemia Maternal Aunt   . Hypertension Maternal  Aunt   . Prostate cancer Maternal Uncle   . Breast cancer Neg Hx     Social History   Socioeconomic History  . Marital status: Married    Spouse name: Not on file  . Number of children: 3  . Years of education: Not on file  . Highest education level: Not on file  Social Needs  . Financial resource strain: Not on file  . Food insecurity - worry: Not on file  . Food insecurity - inability: Not on file  . Transportation needs - medical: Not on file  . Transportation needs - non-medical: Not on file  Occupational History  . Occupation: Proofreader: [employer]  Tobacco Use  . Smoking status: Never Smoker  . Smokeless tobacco: Never Used  Substance and Sexual Activity  . Alcohol use: No    Alcohol/week: 0.0 oz  . Drug use: No  . Sexual activity: Yes    Partners: Male  Other Topics Concern  . Not on file  Social History Narrative   Lives in [place] with husband, two children gone . Daughter's are grown and out of the house. Her 48 yo is going to college              Current Outpatient Medications:  .  aspirin EC 81 MG tablet, Take 1 tablet (81 mg total) by mouth daily., Disp: 90 tablet, Rfl: 3 .  famotidine (PEPCID) 20 MG tablet, Take 1 tablet (20 mg total) by mouth 2 (two) times daily., Disp: 60 tablet, Rfl: 0 .  SUMAtriptan (IMITREX) 50 MG tablet, Take by mouth daily as needed for migraine. , Disp: , Rfl:  .  topiramate (TOPAMAX) 100 MG tablet, Take by mouth daily as needed. , Disp: , Rfl:  .  potassium chloride (K-DUR) 10 MEQ tablet, Take 2 tablets (20 mEq total) by mouth daily for 14 days., Disp: 28 tablet, Rfl: 0  Allergies  Allergen Reactions  . Iodinated Diagnostic Agents Shortness Of Breath  . Chocolate     Hives, itching  . Chocolate Flavor Other (See Comments)    Hives, itching  . Iohexol     CP and SOB  . Peanut-Containing Drug Products Swelling    cashews  . Pineapple      ROS  Constitutional: Negative for fever or weight  change.  Respiratory: Negative for cough and shortness of breath.   Cardiovascular: Negative for chest pain or palpitations.  Gastrointestinal: Negative for abdominal pain, no bowel changes.  Musculoskeletal: Negative for gait problem or joint swelling.  Skin: Negative for rash.  Neurological: Positive for intermittent dizziness or headache.  No other specific complaints in a complete review of systems (except as listed in HPI above).  Objective  Vitals:  08/01/17 0841  BP: 120/80  Pulse: 89  Resp: 14  SpO2: 97%  Weight: 220 lb 6.4 oz (100 kg)  Height: _0  (1.575 m)    Body mass index is 40.31 kg/m.  Physical Exam  Constitutional: Patient appears well-developed and well-nourished. Obese . No distress.  HEENT: head atraumatic, normocephalic, pupils equal and reactive to light,  neck supple, throat within normal limits Cardiovascular: Normal rate, regular rhythm and normal heart sounds.  No murmur heard. No BLE edema. Pulmonary/Chest: Effort normal and breath sounds normal. No respiratory distress. Abdominal: Soft.  There is no tenderness. Psychiatric: Patient has a normal mood and affect. behavior is normal. Judgment and thought content normal.  Recent Results (from the past 2160 hour(s))  Basic metabolic panel     Status: Abnormal   Collection Time: 06/17/17 11:50 AM  Result Value Ref Range   Sodium 136 135 - 145 mmol/L   Potassium 3.2 (L) 3.5 - 5.1 mmol/L   Chloride 104 101 - 111 mmol/L   CO2 25 22 - 32 mmol/L   Glucose, Bld 90 65 - 99 mg/dL   BUN 8 6 - 20 mg/dL   Creatinine, Ser 0.59 0.44 - 1.00 mg/dL   Calcium 9.4 8.9 - 10.3 mg/dL   GFR calc non Af Amer >60 >60 mL/min   GFR calc Af Amer >60 >60 mL/min    Comment: (NOTE) The eGFR has been calculated using the CKD EPI equation. This calculation has not been validated in all clinical situations. eGFR's persistently <60 mL/min signify possible Chronic Kidney Disease.    Anion gap 7 5 - 15  CBC     Status:  Abnormal   Collection Time: 06/17/17 11:50 AM  Result Value Ref Range   WBC 7.1 3.6 - 11.0 K/uL   RBC 4.91 3.80 - 5.20 MIL/uL   Hemoglobin 12.7 12.0 - 16.0 g/dL   HCT 38.7 35.0 - 47.0 %   MCV 78.8 (L) 80.0 - 100.0 fL   MCH 25.9 (L) 26.0 - 34.0 pg   MCHC 32.9 32.0 - 36.0 g/dL   RDW 14.9 (H) 11.5 - 14.5 %   Platelets 291 150 - 440 K/uL  Troponin I     Status: None   Collection Time: 06/17/17 11:50 AM  Result Value Ref Range   Troponin I <0.03 <0.03 ng/mL  Iron and TIBC     Status: Abnormal   Collection Time: 06/28/17  1:54 PM  Result Value Ref Range   Iron 22 (L) 28 - 170 ug/dL   TIBC 459 (H) 250 - 450 ug/dL   Saturation Ratios 5 (L) 10.4 - 31.8 %   UIBC 437 ug/dL  Ferritin     Status: None   Collection Time: 06/28/17  1:54 PM  Result Value Ref Range   Ferritin 29 11 - 307 ng/mL  Magnesium     Status: None   Collection Time: 06/28/17  1:54 PM  Result Value Ref Range   Magnesium 2.1 1.7 - 2.4 mg/dL  Basic Metabolic Panel (BMET)     Status: Abnormal   Collection Time: 06/28/17  1:54 PM  Result Value Ref Range   Sodium 138 135 - 145 mmol/L   Potassium 3.1 (L) 3.5 - 5.1 mmol/L   Chloride 106 101 - 111 mmol/L   CO2 24 22 - 32 mmol/L   Glucose, Bld 108 (H) 65 - 99 mg/dL   BUN 8 6 - 20 mg/dL   Creatinine, Ser 0.62 0.44 - 1.00 mg/dL  Calcium 9.2 8.9 - 10.3 mg/dL   GFR calc non Af Amer >60 >60 mL/min   GFR calc Af Amer >60 >60 mL/min    Comment: (NOTE) The eGFR has been calculated using the CKD EPI equation. This calculation has not been validated in all clinical situations. eGFR's persistently <60 mL/min signify possible Chronic Kidney Disease.    Anion gap 8 5 - 15  Glucose, capillary     Status: None   Collection Time: 07/11/17  8:35 AM  Result Value Ref Range   Glucose-Capillary 93 65 - 99 mg/dL  Vitamin B12     Status: None   Collection Time: 07/19/17  2:30 PM  Result Value Ref Range   Vitamin B-12 256 180 - 914 pg/mL    Comment: (NOTE) This assay is not  validated for testing neonatal or myeloproliferative syndrome specimens for Vitamin B12 levels. Performed at [facility], [place] [address] [place], [place] [address]   Urinalysis, Complete w Microscopic     Status: Abnormal   Collection Time: 07/19/17  2:31 PM  Result Value Ref Range   Color, Urine YELLOW (A) YELLOW   APPearance CLEAR (A) CLEAR   Specific Gravity, Urine 1.019 1.005 - 1.030   pH 6.0 5.0 - 8.0   Glucose, UA NEGATIVE NEGATIVE mg/dL   Hgb urine dipstick NEGATIVE NEGATIVE   Bilirubin Urine NEGATIVE NEGATIVE   Ketones, ur NEGATIVE NEGATIVE mg/dL   Protein, ur NEGATIVE NEGATIVE mg/dL   Nitrite NEGATIVE NEGATIVE   Leukocytes, UA NEGATIVE NEGATIVE   RBC / HPF 0-5 0 - 5 RBC/hpf   WBC, UA 0-5 0 - 5 WBC/hpf   Bacteria, UA NONE SEEN NONE SEEN   Squamous Epithelial / LPF 0-5 (A) NONE SEEN   Mucus PRESENT     Comment: Performed at [facility], [address] [place], [place] [address]  CBC with Differential/Platelet     Status: Abnormal   Collection Time: 07/19/17  2:40 PM  Result Value Ref Range   WBC 7.1 3.6 - 11.0 K/uL   RBC 5.03 3.80 - 5.20 MIL/uL   Hemoglobin 12.9 12.0 - 16.0 g/dL   HCT 39.8 35.0 - 47.0 %   MCV 79.1 (L) 80.0 - 100.0 fL   MCH 25.6 (L) 26.0 - 34.0 pg   MCHC 32.3 32.0 - 36.0 g/dL   RDW 15.5 (H) 11.5 - 14.5 %   Platelets 333 150 - 440 K/uL   Neutrophils Relative % 55 %   Neutro Abs 3.9 1.4 - 6.5 K/uL   Lymphocytes Relative 36 %   Lymphs Abs 2.6 1.0 - 3.6 K/uL   Monocytes Relative 8 %   Monocytes Absolute 0.6 0.2 - 0.9 K/uL   Eosinophils Relative 1 %   Eosinophils Absolute 0.1 0 - 0.7 K/uL   Basophils Relative 0 %   Basophils Absolute 0.0 0 - 0.1 K/uL    Comment: Performed at [facility], [address] [place], [place] [address]  Folate     Status: None   Collection Time: 07/19/17  2:40 PM  Result Value Ref Range   Folate 8.0 >5.9 ng/mL    Comment: Performed at [facility], [address] [place],  [place] [address]  Ferritin     Status: None   Collection Time: 07/19/17  2:40 PM  Result Value Ref Range   Ferritin 25 11 - 307 ng/mL    Comment: Performed at [facility], [address], [place],  [address]  Iron and TIBC     Status: Abnormal   Collection Time: 07/19/17  2:40 PM  Result Value Ref Range   Iron 38 28 - 170 ug/dL   TIBC 445 250 - 450 ug/dL   Saturation Ratios 9 (L) 10.4 - 31.8 %   UIBC 407 ug/dL    Comment: Performed at [facility], [address] [place], [place] [address]  Reticulocytes     Status: None   Collection Time: 07/19/17  2:40 PM  Result Value Ref Range   Retic Ct Pct 1.3 0.4 - 3.1 %   RBC. 4.98 3.80 - 5.20 MIL/uL   Retic Count, Absolute 64.7 19.0 - 183.0 K/uL    Comment: Performed at [facility], [address] [place], [place] [address]      PHQ2/9: Depression screen [facility] 2/9 02/11/2017 03/05/2016 12/05/2015 04/20/2015  Decreased Interest 0 0 0 0  Down, Depressed, Hopeless 0 0 0 0  PHQ - 2 Score 0 0 0 0    Fall Risk: Fall Risk  02/11/2017 03/05/2016 12/05/2015 04/20/2015 03/10/2015  Falls in the past year? Yes No No No No  Number falls in past yr: 2 or more - - - -  Injury with Fall? No - - - -  Risk Factor Category  High Fall Risk - - - -     Assessment & Plan  1. Gastroesophageal reflux disease without esophagitis  Doing well on Pepcid prn, off Nexium   2. Essential hypertension  At goal today   3. OSA on CPAP  She has not been compliant lately  4. Vestibular migraine  Seeing Dr. Manuella Ghazi   5. B12 deficiency  Needs to take SL B12, refuses injections today   6. Metabolic syndrome  Recheck labs next visit   7. Dyslipidemia  Discussed life style modification   8. Hypokalemia  - Potassium - Magnesium

## 2017-08-09 ENCOUNTER — Telehealth: Payer: Self-pay

## 2017-08-09 NOTE — Telephone Encounter (Signed)
Pt informed that machine is done for the Capsule Endoscopy study. Will call when this is fixed and schedule appt. I will send prep instructions via my chart.

## 2017-08-09 NOTE — Telephone Encounter (Signed)
-----   Message from Virgel Manifold, MD sent at 07/25/2017  8:14 AM EST ----- Jackelyn Poling, please schedule this patient for a small bowel capsule for iron deficiency anemia. And please make a clinic appointment for 6-8 weeks.   ----- Message ----- From: Sindy Guadeloupe, MD Sent: 07/19/2017   2:16 PM To: Steele Sizer, MD, Virgel Manifold, MD

## 2017-09-06 ENCOUNTER — Other Ambulatory Visit: Payer: Self-pay

## 2017-09-06 ENCOUNTER — Telehealth: Payer: Self-pay

## 2017-09-06 DIAGNOSIS — D509 Iron deficiency anemia, unspecified: Secondary | ICD-10-CM

## 2017-09-06 NOTE — Telephone Encounter (Signed)
Pt called to schedule small capsule bowel study for iron deficiency anemia. Will call back with time and date.

## 2017-09-11 ENCOUNTER — Other Ambulatory Visit: Payer: Self-pay

## 2017-09-11 DIAGNOSIS — D509 Iron deficiency anemia, unspecified: Secondary | ICD-10-CM

## 2017-09-11 NOTE — Telephone Encounter (Signed)
Left message that small bowel capsule study scheduled for 09/17/2017 at [facility].  Instructions were sent via My Chart. Pt to contact office if any questions.

## 2017-09-16 ENCOUNTER — Telehealth: Payer: Self-pay | Admitting: Gastroenterology

## 2017-09-16 NOTE — Telephone Encounter (Signed)
I spoke with pt and she states it was noon before looked at her mail and was eating lunch. She has not stopped her iron. She called the hospital and they told her to let us know that she would need to cancel her study. Pt would like a Monday if possible when this is rescheduled, so she can prepare on Sunday. I will call pt back and let her know when.

## 2017-09-16 NOTE — Telephone Encounter (Signed)
pt l/m to  speak to Marie Vaughan to reschedule her procedure for tomorror 09/17/17 due to the fact she did not stop her rx please call

## 2017-09-17 ENCOUNTER — Ambulatory Visit: Admission: RE | Admit: 2017-09-17 | Payer: 59 | Source: Ambulatory Visit | Admitting: Gastroenterology

## 2017-09-17 ENCOUNTER — Encounter: Admission: RE | Payer: Self-pay | Source: Ambulatory Visit

## 2017-09-17 ENCOUNTER — Ambulatory Visit: Admit: 2017-09-17 | Payer: 59 | Admitting: Gastroenterology

## 2017-09-17 SURGERY — IMAGING PROCEDURE, GI TRACT, INTRALUMINAL, VIA CAPSULE

## 2017-09-17 NOTE — Telephone Encounter (Signed)
Pt called and capsule study rescheduled to 09/23/2017. Pt is aware to be there at 7:30am and to follow instructions prior to testing that was sent to her.

## 2017-09-19 ENCOUNTER — Inpatient Hospital Stay: Payer: 59

## 2017-09-19 ENCOUNTER — Inpatient Hospital Stay: Payer: 59 | Admitting: Oncology

## 2017-09-23 ENCOUNTER — Encounter
Admission: RE | Disposition: A | Discharge status: Home / Self Care | Payer: Self-pay | Source: Ambulatory Visit | Attending: Gastroenterology

## 2017-09-23 ENCOUNTER — Ambulatory Visit
Admission: RE | Admit: 2017-09-23 | Discharge: 2017-09-23 | Disposition: A | Discharge status: Home / Self Care | Payer: 59 | Source: Ambulatory Visit | Attending: Gastroenterology | Admitting: Gastroenterology

## 2017-09-23 DIAGNOSIS — K31819 Angiodysplasia of stomach and duodenum without bleeding: Secondary | ICD-10-CM | POA: Insufficient documentation

## 2017-09-23 DIAGNOSIS — D509 Iron deficiency anemia, unspecified: Secondary | ICD-10-CM | POA: Insufficient documentation

## 2017-09-23 HISTORY — PX: GIVENS CAPSULE STUDY: SHX5432

## 2017-09-23 SURGERY — IMAGING PROCEDURE, GI TRACT, INTRALUMINAL, VIA CAPSULE

## 2017-09-24 ENCOUNTER — Encounter: Payer: Self-pay | Admitting: Gastroenterology

## 2017-09-25 ENCOUNTER — Encounter: Payer: Self-pay | Admitting: Gastroenterology

## 2017-09-26 ENCOUNTER — Inpatient Hospital Stay: Payer: 59 | Admitting: Oncology

## 2017-09-26 ENCOUNTER — Inpatient Hospital Stay: Payer: 59

## 2017-09-26 NOTE — Progress Notes (Deleted)
Hematology/Oncology Consult note [facility]  Telephone:([phone] [phone]  Patient Care Team: Steele Sizer, MD as PCP - General (Family Medicine) Sindy Guadeloupe, MD as Consulting Physician (Oncology) Virgel Manifold, MD as Consulting Physician (Gastroenterology) Vladimir Crofts, MD as Consulting Physician (Neurology)   Name of the patient: Marie Vaughan  [id number]  Jun 19, 1971   Date of visit: 09/26/17  Diagnosis- iron deficiency anemia  Chief complaint/ Reason for visit- routine f/u of iron deficiency anemia  Heme/Onc history: patient is a 47 year old female who was seen by GI for symptoms of chronic heartburn and abdominal pain and was noted to have microcytic anemia.  Plan is to get an EGD and colonoscopy from their side.  Patient has had a hysterectomy for menorrhagia in the past.  Recent CBC from 06/17/2017 showed white count of 7.1, H&H of 12.7/38.7 with an MCV of 78.8 and a platelet count of 291.  Of note her hemoglobin has been between 12-12.7 over the last 1 year.  Ferritin levels were low normal at 29.With iron study showed a low serum iron of 22, elevated TIBC of 459 and a low iron saturation of 5%.  B12 levels checked back in July 2018 were normal at 310.  Patient underwent EGD and colonoscopy on 07/11/2017.  EGD which showed no abnormal findings.  Colonoscopy showed nonbleeding internal hemorrhoids and 2 polyps in the transverse and sigmoid colon respectively which were resected and showed tubular adenoma and hyperplastic polyp respectively  Currently patient reports doing well.  She has had right upper quadrant abdominal pain on and off.  She did undergo a CT abdomen and pelvis as well in August 2018 which did not reveal any acute pathology.  This was followed by a HIDA scan which was normal.  Patient is currently not taking any oral iron and has not tried oral iron in the past as well  Patient started oral iron in dec 2018.   Results of blood work from 07/19/2017 were as follows: Iron study showed a low iron saturation of 9% and TIBC that was high normal at 445.  Ferritin levels were low at 25.  Folate was normal.  Urinalysis did not reveal any hematuria.  B12 levels were low normal at 256.  CBC showed H&H of 12.9/39.8 with an MCV of 79.1.  Interval history- ***  ECOG PS- *** Pain scale- *** Opioid associated constipation- ***  Review of systems- Review of Systems  Constitutional: Negative for chills, fever, malaise/fatigue and weight loss.  HENT: Negative for congestion, ear discharge and nosebleeds.   Eyes: Negative for blurred vision.  Respiratory: Negative for cough, hemoptysis, sputum production, shortness of breath and wheezing.   Cardiovascular: Negative for chest pain, palpitations, orthopnea and claudication.  Gastrointestinal: Negative for abdominal pain, blood in stool, constipation, diarrhea, heartburn, melena, nausea and vomiting.  Genitourinary: Negative for dysuria, flank pain, frequency, hematuria and urgency.  Musculoskeletal: Negative for back pain, joint pain and myalgias.  Skin: Negative for rash.  Neurological: Negative for dizziness, tingling, focal weakness, seizures, weakness and headaches.  Endo/Heme/Allergies: Does not bruise/bleed easily.  Psychiatric/Behavioral: Negative for depression and suicidal ideas. The patient does not have insomnia.       Allergies  Allergen Reactions  . Iodinated Diagnostic Agents Shortness Of Breath  . Chocolate     Hives, itching  . Chocolate Flavor Other (See Comments)    Hives, itching  . Iohexol     CP and SOB  . Peanut-Containing Drug  Products Swelling    cashews  . Pineapple      Past Medical History:  Diagnosis Date  . Acne   . Acute bronchitis   . Angio-edema   . Anxiety   . Anxiety state, unspecified   . Cervicalgia   . Contact lens/glasses fitting    wears contacts or glasses  . Dysmenorrhea   . GERD (gastroesophageal reflux  disease)   . History of cardiac cath    a. 04/29/2014: LM nl, LAD no dz, D1 no dz, D2 very small vessel, D3 very small vessel, LCx nl, OM1 very small vessel, OM2 medium sized vessel, OM 3 no dz, RCA no dz, PDA no dz   . History of stress test    a. Lexiscan 08/25/13: no sig ischemia, attenuation artifact noted, no sig WMA noted, EF 69%, no EKG changes concerning for ischemia  . HTN (hypertension)   . Iron deficiency anemia secondary to blood loss (chronic)   . Migraine with aura, without mention of intractable migraine without mention of status migrainosus    "vestibular" per pt  . Obesity   . 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 Pain in thoracic spine   . Pharyngitis   . Tear of medial meniscus of left knee   . Tear of medial meniscus of right knee   . Vaginal delivery    x 3  . Wears contact lenses      Past Surgical History:  Procedure Laterality Date  . ABDOMINAL HYSTERECTOMY  04-25-10   Due to abnormal PAP  . CARDIAC CATHETERIZATION  04/2014   [facility]  . COLONOSCOPY WITH PROPOFOL N/A 07/11/2017   Procedure: COLONOSCOPY WITH PROPOFOL;  Surgeon: [name], MD;  Location: [place];  Service: Endoscopy;  Laterality: N/A;  . DILATION AND CURETTAGE OF UTERUS    . ESOPHAGOGASTRODUODENOSCOPY (EGD) WITH PROPOFOL N/A 07/11/2017   Procedure: ESOPHAGOGASTRODUODENOSCOPY (EGD) WITH PROPOFOL;  Surgeon: [name], MD;  Location: [place];  Service: Endoscopy;  Laterality: N/A;  diabetic - diet controlled  . GIVENS CAPSULE STUDY N/A 09/23/2017   Procedure: GIVENS CAPSULE STUDY;  Surgeon: Virgel Manifold, MD;  Location: [facility];  Service: Endoscopy;  Laterality: N/A;  . KNEE ARTHROSCOPY Left 06/10/2013   Procedure: LEFT KNEE ARTHROSCOPY KNEE WITH PARTIAL MEDIAL MENISCECTOMY ;  Surgeon: Lorn Junes, MD;  Location: [place];  Service: Orthopedics;  Laterality: Left;  xerofoam, 4x4's, webril, ace wrap, ice wrap  . KNEE ARTHROSCOPY  WITH MEDIAL MENISECTOMY Right 06/10/2013   Procedure: RIGHT KNEE ARTHROSCOPY WITH MEDIAL MENISECTOMY;  Surgeon: Lorn Junes, MD;  Location: [place];  Service: Orthopedics;  Laterality: Right;  partial lateral menisectomoy and chondroplasty  . OVARIAN CYST REMOVAL  04-25-10    Social History   Socioeconomic History  . Marital status: Married    Spouse name: Not on file  . Number of children: 3  . Years of education: Not on file  . Highest education level: Not on file  Social Needs  . Financial resource strain: Not on file  . Food insecurity - worry: Not on file  . Food insecurity - inability: Not on file  . Transportation needs - medical: Not on file  . Transportation needs - non-medical: Not on file  Occupational History  . Occupation: Proofreader: [employer]  Tobacco Use  . Smoking status: Never Smoker  . Smokeless tobacco: Never Used  Substance and Sexual Activity  .  Alcohol use: No    Alcohol/week: 0.0 oz  . Drug use: No  . Sexual activity: Yes    Partners: Male  Other Topics Concern  . Not on file  Social History Narrative   Lives in [place] with husband, two children gone . Daughter's are grown and out of the house. Her 47 yo is going to college             Family History  Problem Relation Age of Onset  . Early death Mother        Murdered when pt was 13 years old  . Diabetes Mother   . Hypertension Mother   . Gout Mother   . Kidney failure Mother   . Early death Sister 31       due to hemorage  . Alcohol abuse Maternal Uncle   . Alcohol abuse Other   . Arthritis Other   . Heart disease Other   . Other Other        cousin- phlebitis  . Diabetes Maternal Aunt   . Hyperlipidemia Maternal Aunt   . Hypertension Maternal Aunt   . Prostate cancer Maternal Uncle   . Breast cancer Neg Hx      Current Outpatient Medications:  .  aspirin EC 81 MG tablet, Take 1 tablet (81 mg total) by mouth daily., Disp: 90 tablet,  Rfl: 3 .  Cyanocobalamin (B-12) 1000 MCG SUBL, Place 1 tablet under the tongue daily., Disp: 90 each, Rfl: 1 .  famotidine (PEPCID) 20 MG tablet, Take 1 tablet (20 mg total) by mouth 2 (two) times daily., Disp: 60 tablet, Rfl: 0 .  potassium chloride (K-DUR) 10 MEQ tablet, Take 2 tablets (20 mEq total) by mouth daily for 14 days., Disp: 28 tablet, Rfl: 0 .  SUMAtriptan (IMITREX) 50 MG tablet, Take by mouth daily as needed for migraine. , Disp: , Rfl:  .  topiramate (TOPAMAX) 100 MG tablet, Take by mouth daily as needed. , Disp: , Rfl:   Physical exam: There were no vitals filed for this visit. Physical Exam  Constitutional: She is oriented to person, place, and time and well-developed, well-nourished, and in no distress.  HENT:  Head: Normocephalic and atraumatic.  Eyes: EOM are normal. Pupils are equal, round, and reactive to light.  Neck: Normal range of motion.  Cardiovascular: Normal rate, regular rhythm and normal heart sounds.  Pulmonary/Chest: Effort normal and breath sounds normal.  Abdominal: Soft. Bowel sounds are normal.  Neurological: She is alert and oriented to person, place, and time.  Skin: Skin is warm and dry.     CMP Latest Ref Rng & Units 06/28/2017  Glucose 65 - 99 mg/dL 108(H)  BUN 6 - 20 mg/dL 8  Creatinine 0.44 - 1.00 mg/dL 0.62  Sodium 135 - 145 mmol/L 138  Potassium 3.5 - 5.1 mmol/L 3.1(L)  Chloride 101 - 111 mmol/L 106  CO2 22 - 32 mmol/L 24  Calcium 8.9 - 10.3 mg/dL 9.2  Total Protein 6.5 - 8.1 g/dL -  Total Bilirubin 0.3 - 1.2 mg/dL -  Alkaline Phos 38 - 126 U/L -  AST 15 - 41 U/L -  ALT 14 - 54 U/L -   CBC Latest Ref Rng & Units 07/19/2017  WBC 3.6 - 11.0 K/uL 7.1  Hemoglobin 12.0 - 16.0 g/dL 12.9  Hematocrit 35.0 - 47.0 % 39.8  Platelets 150 - 440 K/uL 333    No images are attached to the encounter.  No results found.  Assessment and plan- Patient is a 47 y.o. female with iron deficiency anemia of unclear etiology- egd/colonoscopy  unremarkable     Visit Diagnosis 1. Iron deficiency anemia, unspecified iron deficiency anemia type      Dr. Randa Evens, MD, MPH [facility] Pager- [phone] 09/26/2017 8:07 AM

## 2017-09-27 ENCOUNTER — Encounter: Payer: Self-pay | Admitting: Oncology

## 2017-09-27 ENCOUNTER — Inpatient Hospital Stay: Payer: 59 | Attending: Oncology

## 2017-09-27 ENCOUNTER — Inpatient Hospital Stay (HOSPITAL_BASED_OUTPATIENT_CLINIC_OR_DEPARTMENT_OTHER): Payer: 59 | Admitting: Oncology

## 2017-09-27 ENCOUNTER — Telehealth: Payer: Self-pay

## 2017-09-27 VITALS — BP 134/86 | HR 92 | Temp 97.2°F | Resp 18 | Wt 220.0 lb

## 2017-09-27 DIAGNOSIS — D509 Iron deficiency anemia, unspecified: Secondary | ICD-10-CM | POA: Diagnosis not present

## 2017-09-27 LAB — CBC WITH DIFFERENTIAL/PLATELET
Basophils Absolute: 0 10*3/uL (ref 0–0.1)
Basophils Relative: 0 %
EOS ABS: 0.1 10*3/uL (ref 0–0.7)
EOS PCT: 1 %
HCT: 37.2 % (ref 35.0–47.0)
Hemoglobin: 12.4 g/dL (ref 12.0–16.0)
LYMPHS ABS: 2 10*3/uL (ref 1.0–3.6)
Lymphocytes Relative: 31 %
MCH: 26.9 pg (ref 26.0–34.0)
MCHC: 33.4 g/dL (ref 32.0–36.0)
MCV: 80.5 fL (ref 80.0–100.0)
MONOS PCT: 7 %
Monocytes Absolute: 0.5 10*3/uL (ref 0.2–0.9)
Neutro Abs: 4 10*3/uL (ref 1.4–6.5)
Neutrophils Relative %: 61 %
PLATELETS: 322 10*3/uL (ref 150–440)
RBC: 4.63 MIL/uL (ref 3.80–5.20)
RDW: 14.9 % — ABNORMAL HIGH (ref 11.5–14.5)
WBC: 6.6 10*3/uL (ref 3.6–11.0)

## 2017-09-27 LAB — FERRITIN: Ferritin: 42 ng/mL (ref 11–307)

## 2017-09-27 LAB — IRON AND TIBC
IRON: 26 ug/dL — AB (ref 28–170)
Saturation Ratios: 7 % — ABNORMAL LOW (ref 10.4–31.8)
TIBC: 402 ug/dL (ref 250–450)
UIBC: 376 ug/dL

## 2017-09-27 NOTE — Progress Notes (Signed)
Hematology/Oncology Consult note [facility]  Telephone:([phone] [phone]  Patient Care Team: Steele Sizer, MD as PCP - General (Family Medicine) Sindy Guadeloupe, MD as Consulting Physician (Oncology) Virgel Manifold, MD as Consulting Physician (Gastroenterology) Vladimir Crofts, MD as Consulting Physician (Neurology)   Name of the patient: Marie Vaughan  [id number]  1970/09/19   Date of visit: 09/27/17  Diagnosis- iron deficiency anemia  Chief complaint/ Reason for visit- routine f/u of iron deficiency anemia  Heme/Onc history: patient is a 47 year old female who was seen by GI for symptoms of chronic heartburn and abdominal pain and was noted to have microcytic anemia.  Plan is to get an EGD and colonoscopy from their side.  Patient has had a hysterectomy for menorrhagia in the past.  Recent CBC from 06/17/2017 showed white count of 7.1, H&H of 12.7/38.7 with an MCV of 78.8 and a platelet count of 291.  Of note her hemoglobin has been between 12-12.7 over the last 1 year.  Ferritin levels were low normal at 29.With iron study showed a low serum iron of 22, elevated TIBC of 459 and a low iron saturation of 5%.  B12 levels checked back in July 2018 were normal at 310.  Patient underwent EGD and colonoscopy on 07/11/2017.  EGD which showed no abnormal findings.  Colonoscopy showed nonbleeding internal hemorrhoids and 2 polyps in the transverse and sigmoid colon respectively which were resected and showed tubular adenoma and hyperplastic polyp respectively  Currently patient reports doing well.  She has had right upper quadrant abdominal pain on and off.  She did undergo a CT abdomen and pelvis as well in August 2018 which did not reveal any acute pathology.  This was followed by a HIDA scan which was normal.  Patient is currently not taking any oral iron and has not tried oral iron in the past as well  Given that patients irond eficiency was  mild with hb of 12.9- trial of oral iron was recommended   Interval history- overall she is doing well. Denies any fatigue. She is taking 1 ironn pill a day and tolerating it well without any side effects  ECOG PS- 0 Pain scale- 0   Review of systems- Review of Systems  Constitutional: Negative for chills, fever, malaise/fatigue and weight loss.  HENT: Negative for congestion, ear discharge and nosebleeds.   Eyes: Negative for blurred vision.  Respiratory: Negative for cough, hemoptysis, sputum production, shortness of breath and wheezing.   Cardiovascular: Negative for chest pain, palpitations, orthopnea and claudication.  Gastrointestinal: Negative for abdominal pain, blood in stool, constipation, diarrhea, heartburn, melena, nausea and vomiting.  Genitourinary: Negative for dysuria, flank pain, frequency, hematuria and urgency.  Musculoskeletal: Negative for back pain, joint pain and myalgias.  Skin: Negative for rash.  Neurological: Negative for dizziness, tingling, focal weakness, seizures, weakness and headaches.  Endo/Heme/Allergies: Does not bruise/bleed easily.  Psychiatric/Behavioral: Negative for depression and suicidal ideas. The patient does not have insomnia.       Allergies  Allergen Reactions  . Iodinated Diagnostic Agents Shortness Of Breath  . Chocolate     Hives, itching  . Chocolate Flavor Other (See Comments)    Hives, itching  . Iohexol     CP and SOB  . Peanut-Containing Drug Products Swelling    cashews  . Pineapple      Past Medical History:  Diagnosis Date  . Acne   . Acute bronchitis   . Angio-edema   .  Anxiety   . Anxiety state, unspecified   . Cervicalgia   . Contact lens/glasses fitting    wears contacts or glasses  . Dysmenorrhea   . GERD (gastroesophageal reflux disease)   . History of cardiac cath    a. 04/29/2014: LM nl, LAD no dz, D1 no dz, D2 very small vessel, D3 very small vessel, LCx nl, OM1 very small vessel, OM2 medium sized  vessel, OM 3 no dz, RCA no dz, PDA no dz   . History of stress test    a. Lexiscan 08/25/13: no sig ischemia, attenuation artifact noted, no sig WMA noted, EF 69%, no EKG changes concerning for ischemia  . HTN (hypertension)   . Iron deficiency anemia secondary to blood loss (chronic)   . Migraine with aura, without mention of intractable migraine without mention of status migrainosus    "vestibular" per pt  . Obesity   . 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 Pain in thoracic spine   . Pharyngitis   . Tear of medial meniscus of left knee   . Tear of medial meniscus of right knee   . Vaginal delivery    x 3  . Wears contact lenses      Past Surgical History:  Procedure Laterality Date  . ABDOMINAL HYSTERECTOMY  04-25-10   Due to abnormal PAP  . CARDIAC CATHETERIZATION  04/2014   [facility]  . COLONOSCOPY WITH PROPOFOL N/A 07/11/2017   Procedure: COLONOSCOPY WITH PROPOFOL;  Surgeon: [name], MD;  Location: [place];  Service: Endoscopy;  Laterality: N/A;  . DILATION AND CURETTAGE OF UTERUS    . ESOPHAGOGASTRODUODENOSCOPY (EGD) WITH PROPOFOL N/A 07/11/2017   Procedure: ESOPHAGOGASTRODUODENOSCOPY (EGD) WITH PROPOFOL;  Surgeon: [name], MD;  Location: [place];  Service: Endoscopy;  Laterality: N/A;  diabetic - diet controlled  . GIVENS CAPSULE STUDY N/A 09/23/2017   Procedure: GIVENS CAPSULE STUDY;  Surgeon: Virgel Manifold, MD;  Location: [facility];  Service: Endoscopy;  Laterality: N/A;  . KNEE ARTHROSCOPY Left 06/10/2013   Procedure: LEFT KNEE ARTHROSCOPY KNEE WITH PARTIAL MEDIAL MENISCECTOMY ;  Surgeon: Lorn Junes, MD;  Location: [place];  Service: Orthopedics;  Laterality: Left;  xerofoam, 4x4's, webril, ace wrap, ice wrap  . KNEE ARTHROSCOPY WITH MEDIAL MENISECTOMY Right 06/10/2013   Procedure: RIGHT KNEE ARTHROSCOPY WITH MEDIAL MENISECTOMY;  Surgeon: Lorn Junes, MD;  Location: [place];   Service: Orthopedics;  Laterality: Right;  partial lateral menisectomoy and chondroplasty  . OVARIAN CYST REMOVAL  04-25-10    Social History   Socioeconomic History  . Marital status: Married    Spouse name: Not on file  . Number of children: 3  . Years of education: Not on file  . Highest education level: Not on file  Social Needs  . Financial resource strain: Not on file  . Food insecurity - worry: Not on file  . Food insecurity - inability: Not on file  . Transportation needs - medical: Not on file  . Transportation needs - non-medical: Not on file  Occupational History  . Occupation: Proofreader: [employer]  Tobacco Use  . Smoking status: Never Smoker  . Smokeless tobacco: Never Used  Substance and Sexual Activity  . Alcohol use: No    Alcohol/week: 0.0 oz  . Drug use: No  . Sexual activity: Yes    Partners: Male  Other Topics Concern  . Not on file  Social History Narrative   Lives in [place] with husband, two children gone . Daughter's are grown and out of the house. Her 48 yo is going to college             Family History  Problem Relation Age of Onset  . Early death Mother        Murdered when pt was 33 years old  . Diabetes Mother   . Hypertension Mother   . Gout Mother   . Kidney failure Mother   . Early death Sister 29       due to hemorage  . Alcohol abuse Maternal Uncle   . Alcohol abuse Other   . Arthritis Other   . Heart disease Other   . Other Other        cousin- phlebitis  . Diabetes Maternal Aunt   . Hyperlipidemia Maternal Aunt   . Hypertension Maternal Aunt   . Prostate cancer Maternal Uncle   . Breast cancer Neg Hx      Current Outpatient Medications:  .  aspirin EC 81 MG tablet, Take 1 tablet (81 mg total) by mouth daily., Disp: 90 tablet, Rfl: 3 .  Cyanocobalamin (B-12) 1000 MCG SUBL, Place 1 tablet under the tongue daily., Disp: 90 each, Rfl: 1 .  famotidine (PEPCID) 20 MG tablet, Take 1 tablet (20 mg  total) by mouth 2 (two) times daily., Disp: 60 tablet, Rfl: 0 .  potassium chloride (K-DUR) 10 MEQ tablet, Take 2 tablets (20 mEq total) by mouth daily for 14 days., Disp: 28 tablet, Rfl: 0 .  SUMAtriptan (IMITREX) 50 MG tablet, Take by mouth daily as needed for migraine. , Disp: , Rfl:  .  topiramate (TOPAMAX) 100 MG tablet, Take by mouth daily as needed. , Disp: , Rfl:   Physical exam:  Vitals:   09/27/17 1437  BP: 134/86  Pulse: 92  Resp: 18  Temp: (!) 97.2 F (36.2 C)  TempSrc: Tympanic  Weight: 220 lb (99.8 kg)   Physical Exam  Constitutional: She is oriented to person, place, and time and well-developed, well-nourished, and in no distress.  HENT:  Head: Normocephalic and atraumatic.  Eyes: EOM are normal. Pupils are equal, round, and reactive to light.  Neck: Normal range of motion.  Cardiovascular: Normal rate, regular rhythm and normal heart sounds.  Pulmonary/Chest: Effort normal and breath sounds normal.  Abdominal: Soft. Bowel sounds are normal.  Neurological: She is alert and oriented to person, place, and time.  Skin: Skin is warm and dry.     CMP Latest Ref Rng & Units 06/28/2017  Glucose 65 - 99 mg/dL 108(H)  BUN 6 - 20 mg/dL 8  Creatinine 0.44 - 1.00 mg/dL 0.62  Sodium 135 - 145 mmol/L 138  Potassium 3.5 - 5.1 mmol/L 3.1(L)  Chloride 101 - 111 mmol/L 106  CO2 22 - 32 mmol/L 24  Calcium 8.9 - 10.3 mg/dL 9.2  Total Protein 6.5 - 8.1 g/dL -  Total Bilirubin 0.3 - 1.2 mg/dL -  Alkaline Phos 38 - 126 U/L -  AST 15 - 41 U/L -  ALT 14 - 54 U/L -   CBC Latest Ref Rng & Units 09/27/2017  WBC 3.6 - 11.0 K/uL 6.6  Hemoglobin 12.0 - 16.0 g/dL 12.4  Hematocrit 35.0 - 47.0 % 37.2  Platelets 150 - 440 K/uL 322     Assessment and plan- Patient is a 46 y.o. female with iron deficiency anemia of unclear etiology  Patient is not  anemic but continues to be iron deficient despite oral iron. I would therefore like to proceed with IV iron. Discussed risks and benefits of  Feraheme 510 mg iV weekly X2 including all but not limited to headache, leg swelling, risk of infusion reaction. Patient understands and will think about it and let us know.  Repeat cbc ferritin and iron studies in 3 and 6 months and I will see her back in 6 months  Given that she has had hysterectomy and is iron deficient- I would recommend that she should get capsule endoscopy to rule out small bowel bleed     Visit Diagnosis 1. Iron deficiency anemia, unspecified iron deficiency anemia type      Dr. Randa Evens, MD, MPH [place] [facility] Pager- [phone] 09/27/2017

## 2017-09-27 NOTE — Telephone Encounter (Signed)
Pt returns call where Dr. Bonna Gains called her early for results of small capsule study. Dr. Bonna Gains calls office for me to tell pt. That there was no bleeding, 3-4 avms, not bleeding and this could cause decrease in iron. Also noted red spots but this doesn't necessarily mean anything. At this time she does not have any concerns and for pt to continue iron and f/u with hematology.

## 2017-09-30 ENCOUNTER — Encounter: Payer: Self-pay | Admitting: Gastroenterology

## 2017-09-30 ENCOUNTER — Telehealth: Payer: Self-pay | Admitting: *Deleted

## 2017-09-30 NOTE — Telephone Encounter (Signed)
Left VM message for patient re: low iron levels and the need for 2 doses of IV Feraheme, per Dr. Elroy Channel request. Patient is to call me back to let me know that she got the message and to say that she is agreeable to this plan.         dhs

## 2017-10-02 ENCOUNTER — Telehealth: Payer: Self-pay | Admitting: *Deleted

## 2017-10-02 NOTE — Telephone Encounter (Signed)
Per Dr. Elroy Channel request, spoke with patient regarding her low iron levels and the need for 2 doses of IV iron. Patient is agreeable to this plan. I will send a message to scheduling to make appointments for 2 doses of IV feraheme, one week apart.          dhs

## 2017-10-08 IMAGING — MR MR MRA HEAD W/O CM
1 series · 23 of 48 positions shown · non-contrast
Comparison: MRI brain 12/11/2015.  MR brain 02/11/2007.

CLINICAL DATA: Generalized weakness and dizziness. Extensive white
matter disease on MRI, progressive from 6889.

EXAM:
MRA HEAD WITHOUT CONTRAST
TECHNIQUE: Angiographic images of the Circle of Willis were obtained using MRA
technique without intravenous contrast.

[Series 2: TOF · axial · non-contrast · 0.7mm · 0.37mm/px · z∈[-49,+46]mm · 23 of 143 slices shown]
[im 1/143]
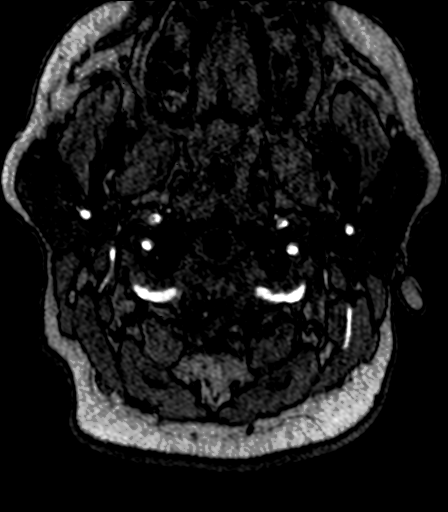
[im 4/143]
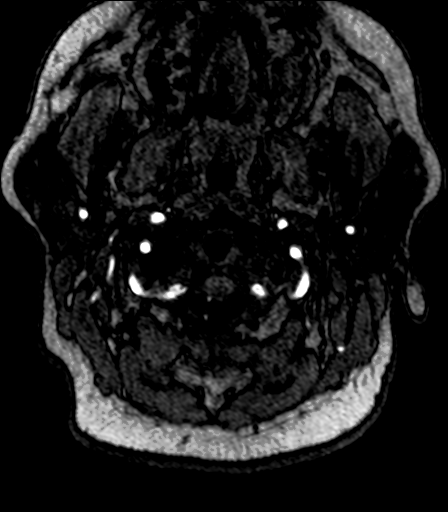
[im 7/143]
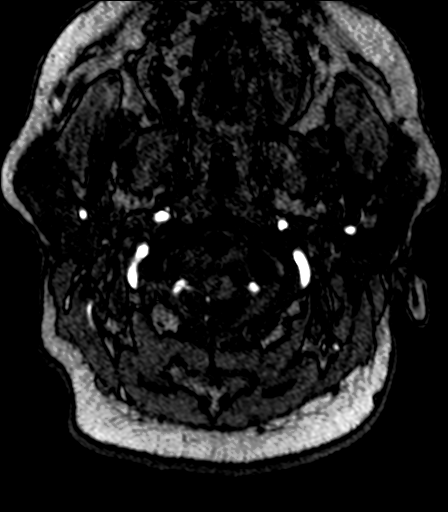
[im 10/143]
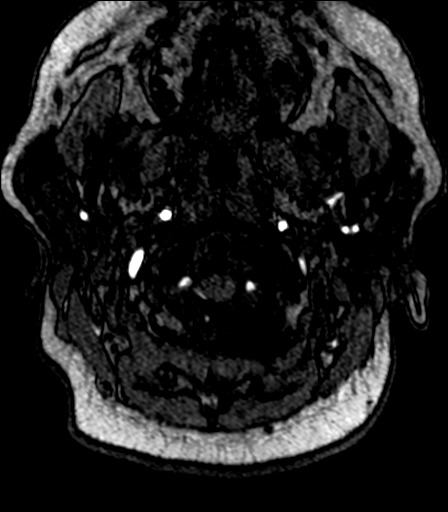
[im 13/143]
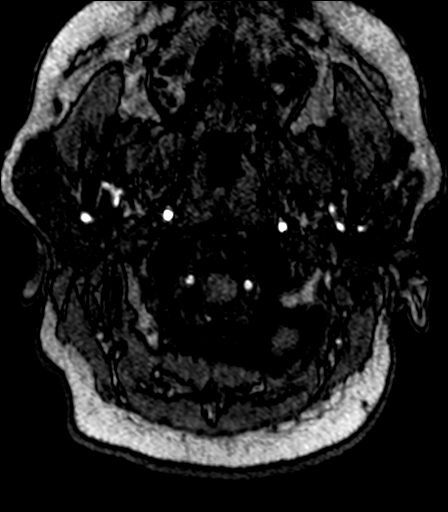
[im 16/143]
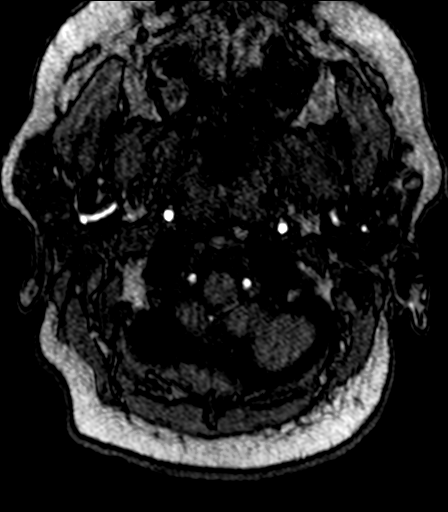
[im 19/143]
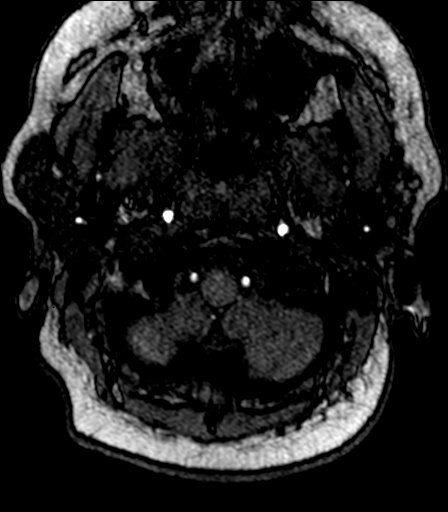
[im 22/143]
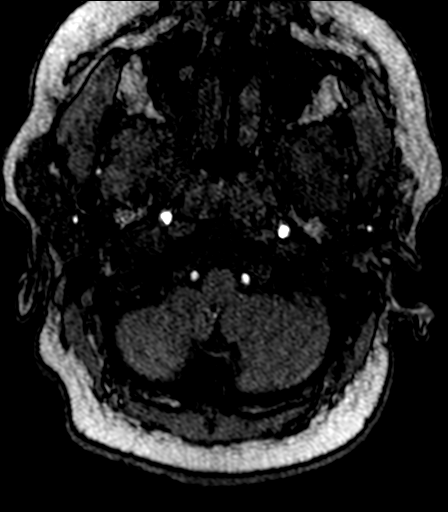
[im 25/143]
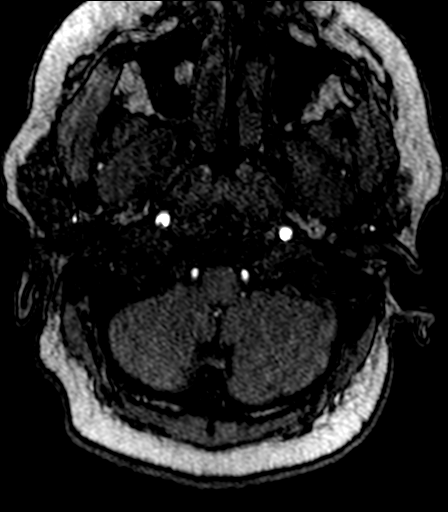
[im 28/143]
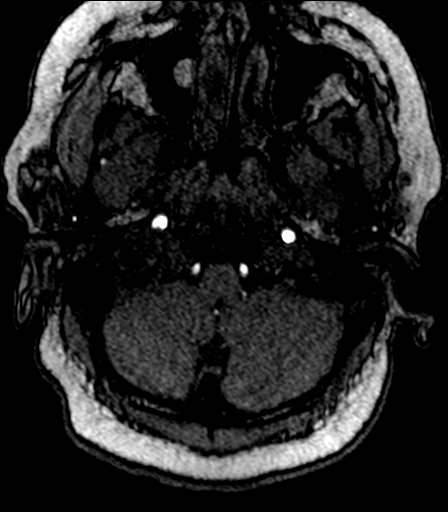
[im 31/143]
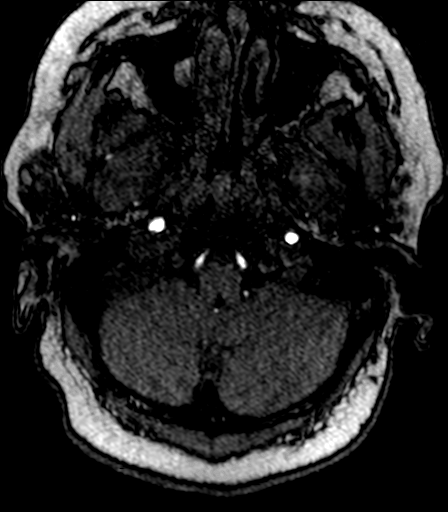
[im 34/143]
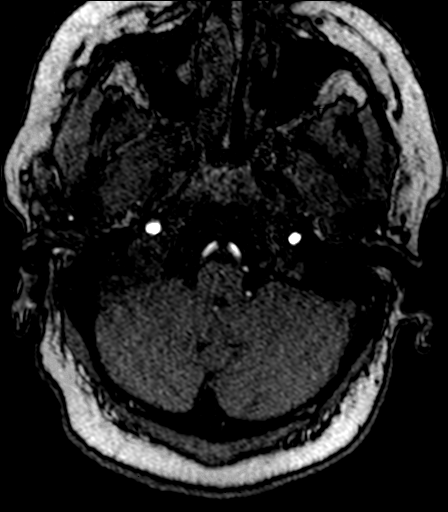
[im 37/143]
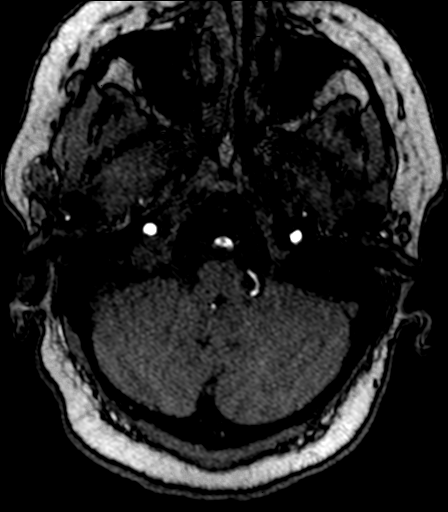
[im 40/143]
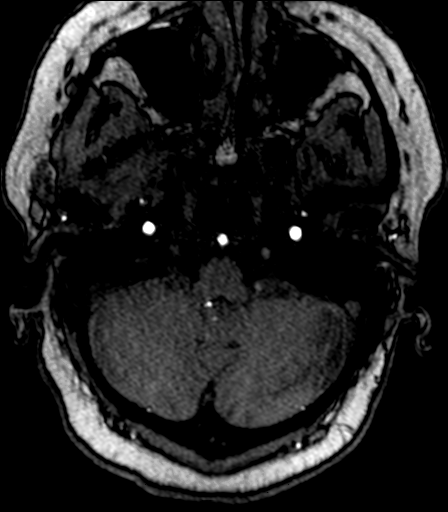
[im 43/143]
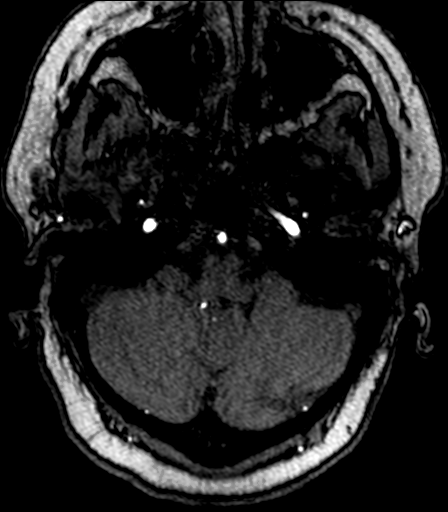
[im 46/143]
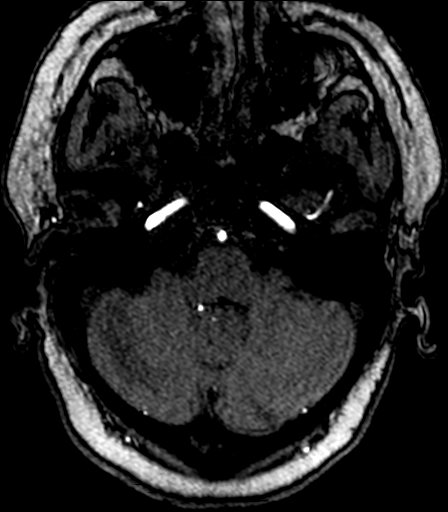
[im 64/143]
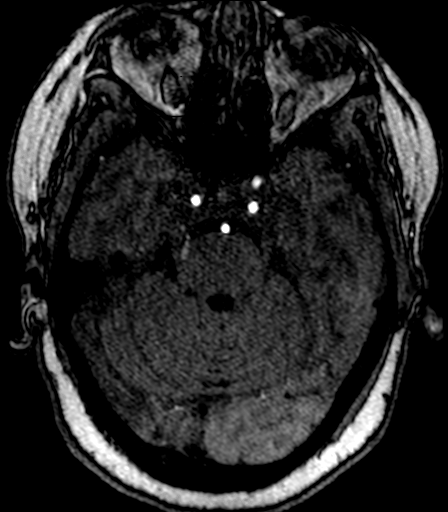
[im 73/143]
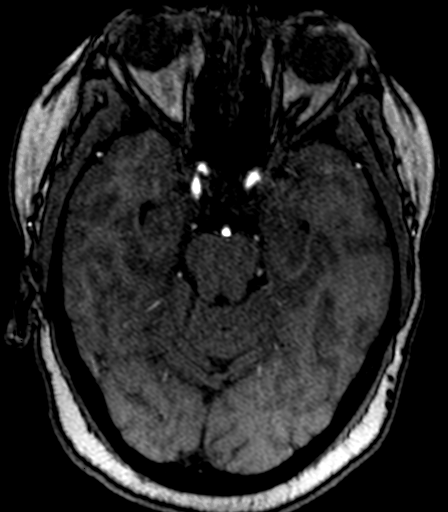
[im 82/143]
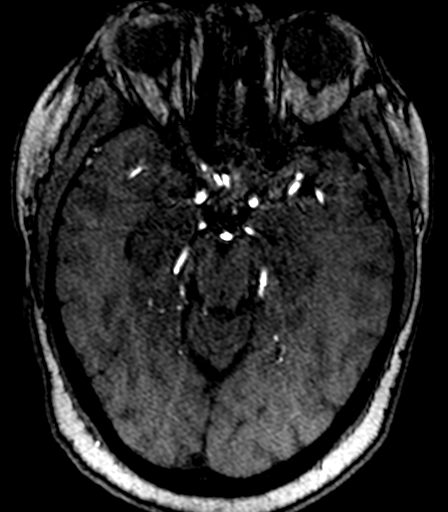
[im 100/143]
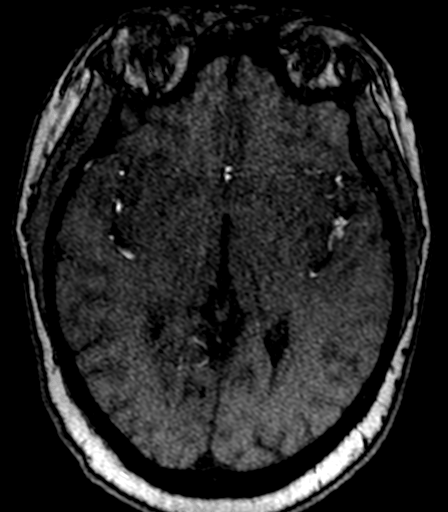
[im 118/143]
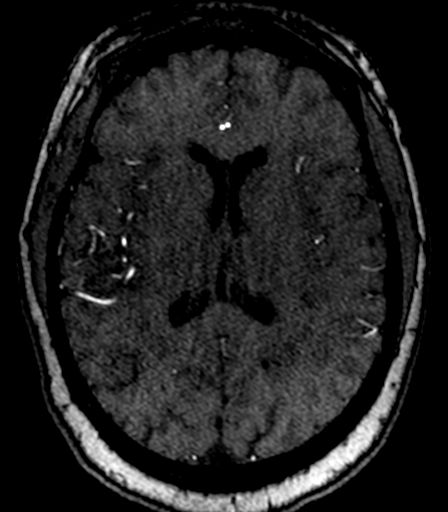
[im 121/143]
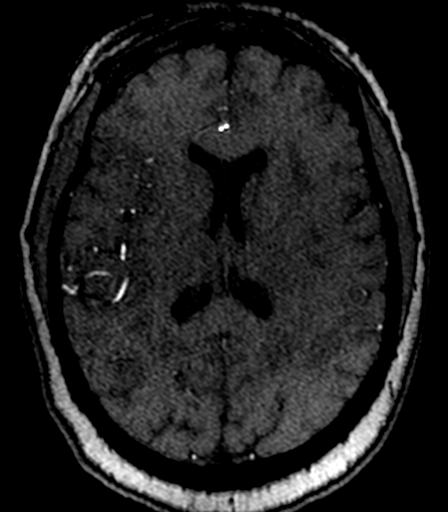
[im 136/143]
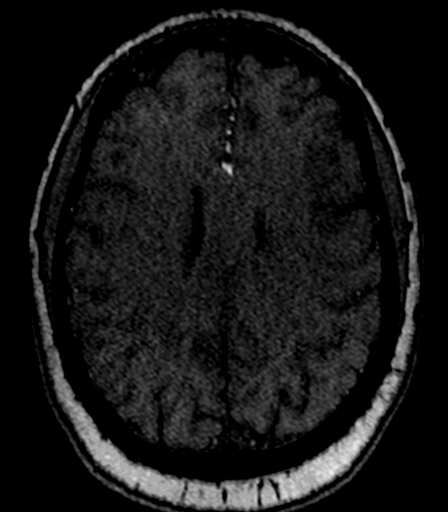

[23 of 48 positions shown; findings below may reference images not displayed]

FINDINGS: The internal carotid arteries are widely patent. The basilar artery
is widely patent with vertebrals codominant. No flow-limiting
intracranial stenosis or aneurysm. No MCA branch occlusion.
Codominant anterior cerebrals. Posterior circulation unremarkable.
IMPRESSION: Negative MRA intracranial.

## 2017-10-09 ENCOUNTER — Inpatient Hospital Stay: Payer: 59

## 2017-10-09 VITALS — BP 138/90 | HR 86 | Temp 98.0°F | Resp 18

## 2017-10-09 DIAGNOSIS — D509 Iron deficiency anemia, unspecified: Secondary | ICD-10-CM | POA: Diagnosis not present

## 2017-10-09 MED ORDER — SODIUM CHLORIDE 0.9 % IV SOLN
Freq: Once | INTRAVENOUS | Status: AC
  Administered 2017-10-09: 14:00:00 via INTRAVENOUS
  Filled 2017-10-09: qty 1000

## 2017-10-09 MED ORDER — SODIUM CHLORIDE 0.9 % IV SOLN
510.0000 mg | Freq: Once | INTRAVENOUS | Status: AC
  Administered 2017-10-09: 510 mg via INTRAVENOUS
  Filled 2017-10-09: qty 17

## 2017-10-16 ENCOUNTER — Inpatient Hospital Stay: Payer: 59

## 2017-10-16 VITALS — BP 137/87 | HR 86 | Temp 97.4°F | Resp 20

## 2017-10-16 DIAGNOSIS — D509 Iron deficiency anemia, unspecified: Secondary | ICD-10-CM

## 2017-10-16 MED ORDER — FERUMOXYTOL INJECTION 510 MG/17 ML
510.0000 mg | Freq: Once | INTRAVENOUS | Status: AC
  Administered 2017-10-16: 510 mg via INTRAVENOUS
  Filled 2017-10-16: qty 17

## 2017-10-16 MED ORDER — SODIUM CHLORIDE 0.9 % IV SOLN
Freq: Once | INTRAVENOUS | Status: AC
  Administered 2017-10-16: 14:00:00 via INTRAVENOUS
  Filled 2017-10-16: qty 1000

## 2017-10-29 ENCOUNTER — Telehealth: Payer: Self-pay | Admitting: Family Medicine

## 2017-10-29 NOTE — Telephone Encounter (Signed)
I am sorry, our policy is to not call cough medication without being seen, she can try otc medication, but will need to come in otherwise

## 2017-10-29 NOTE — Telephone Encounter (Signed)
Pt has been coughing a lot just at night and she would like to see if you could call her in the cough medication (cough syrup with codeine) that you gave her before. She states that she is not running fever but does have nasal drainage but think it could be coming from her allergies. Pt uses employee pharmacy

## 2017-10-30 NOTE — Telephone Encounter (Signed)
Spoke with patient and she was informed that she would have to be seen. I also offered an appointment with Benjamine Mola but pt declined at this time.

## 2017-10-31 ENCOUNTER — Ambulatory Visit: Payer: Self-pay | Admitting: Family Medicine

## 2017-10-31 VITALS — BP 142/100 | HR 92 | Temp 98.4°F | Wt 217.0 lb

## 2017-10-31 DIAGNOSIS — J069 Acute upper respiratory infection, unspecified: Secondary | ICD-10-CM

## 2017-10-31 DIAGNOSIS — I10 Essential (primary) hypertension: Secondary | ICD-10-CM

## 2017-10-31 DIAGNOSIS — J029 Acute pharyngitis, unspecified: Secondary | ICD-10-CM

## 2017-10-31 LAB — POCT RAPID STREP A (OFFICE): Rapid Strep A Screen: NEGATIVE

## 2017-10-31 MED ORDER — FLUCONAZOLE 150 MG PO TABS: 150.0000 mg | ORAL_TABLET | Freq: Once | ORAL | 0 refills | Status: AC

## 2017-10-31 MED ORDER — LEVOCETIRIZINE DIHYDROCHLORIDE 5 MG PO TABS: 5.0000 mg | ORAL_TABLET | Freq: Every evening | ORAL | 2 refills | Status: DC

## 2017-10-31 MED ORDER — AMOXICILLIN-POT CLAVULANATE 875-125 MG PO TABS: 1.0000 | ORAL_TABLET | Freq: Two times a day (BID) | ORAL | 0 refills | Status: DC

## 2017-10-31 NOTE — Progress Notes (Signed)
2/17

## 2017-10-31 NOTE — Patient Instructions (Signed)
You are being treated today for pharyngitis and probable sinus infection.  Start Augmentin 1 tablet every 12 hours daily for total of 10 days.  If vaginal irritation develops I have prescribed a Diflucan take 1 tablet if needed.  I have also prescribed levo cetirizine 5 mg once daily at bedtime this will help with your allergy-like symptoms and postnasal drip.  Your blood pressure was elevated today x3 blood pressure checks.  Please follow-up with your primary care provider as an review of your chart Noticed that her blood pressure has been elevated since December and you were previously treated with blood pressure medication which was discontinued in July 2018.  As we discussed uncontrolled blood pressure has significant impact on her overall cardiovascular health therefore please do not delay following up with your primary care provider.

## 2017-10-31 NOTE — Progress Notes (Signed)
Patient ID: Marie Vaughan, female    DOB: 1970/10/14, 47 y.o.   MRN: [id number]  PCP: Steele Sizer, MD  Chief Complaint  Patient presents with  . save-sorethroat    Subjective:  HPI Marie Vaughan is a 47 y.o. female presents for evaluation of sinus congestion and sore throat. Sinus congestion with post nasal drainage has remained present for over 1 week. She developed sore throat x 2 days ago and noticed increase swelling of lymph nodes and white patches in her throat yesterday. Endorses pain with swallowing, persistent nasal drainage which precipitates a cough. Denies fever, N&V, chills, wheezes, or shortness of breath. Marie Vaughan's blood pressure was elevated upon arrival today x 3 measurements. She reports a history of hypertension and was medically managed with HCTZ. She developed hypotension and medication was discontinued around July 2018 by her PCP. She has not resumed medication since that time. Social History   Socioeconomic History  . Marital status: Married    Spouse name: Not on file  . Number of children: 3  . Years of education: Not on file  . Highest education level: Not on file  Occupational History  . Occupation: Proofreader: [employer]  . Financial resource strain: Not on file  . Food insecurity:    Worry: Not on file    Inability: Not on file  . Transportation needs:    Medical: Not on file    Non-medical: Not on file  Tobacco Use  . Smoking status: Never Smoker  . Smokeless tobacco: Never Used  Substance and Sexual Activity  . Alcohol use: No    Alcohol/week: 0.0 oz  . Drug use: No  . Sexual activity: Yes    Partners: Male  Lifestyle  . Physical activity:    Days per week: Not on file    Minutes per session: Not on file  . Stress: Not on file  Relationships  . Social connections:    Talks on phone: Not on file    Gets together: Not on file    Attends religious service: Not on file    Active member of club or  organization: Not on file    Attends meetings of clubs or organizations: Not on file    Relationship status: Not on file  . Intimate partner violence:    Fear of current or ex partner: Not on file    Emotionally abused: Not on file    Physically abused: Not on file    Forced sexual activity: Not on file  Other Topics Concern  . Not on file  Social History Narrative   Lives in [place] with husband, two children gone . Daughter's are grown and out of the house. Her 48 yo is going to college             Family History  Problem Relation Age of Onset  . Early death Mother        Murdered when pt was 5 years old  . Diabetes Mother   . Hypertension Mother   . Gout Mother   . Kidney failure Mother   . Early death Sister 55       due to hemorage  . Alcohol abuse Maternal Uncle   . Alcohol abuse Other   . Arthritis Other   . Heart disease Other   . Other Other        cousin- phlebitis  . Diabetes Maternal Aunt   .  Hyperlipidemia Maternal Aunt   . Hypertension Maternal Aunt   . Prostate cancer Maternal Uncle   . Breast cancer Neg Hx    Review of Systems  Pertinent negative listed in HPI  Patient Active Problem List   Diagnosis Date Noted  . Colon cancer screening   . Polyp of sigmoid colon   . Benign neoplasm of transverse colon   . Iron deficiency anemia   . Metabolic syndrome 62/83/1517  . OSA on CPAP 02/03/2016  . 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 Abnormal brain MRI 12/20/2015  . Dizziness 12/13/2015  . Near syncope 12/10/2015  . Atypical chest pain 08/22/2013  . Essential hypertension 08/22/2013  . Angio-edema   . GERD (gastroesophageal reflux disease)   . Migraine with aura and without status migrainosus   . Acne   . Anxiety   . Allergic rhinitis 10/05/2011  . Insomnia 08/30/2011  . Obesity 08/30/2011    Allergies  Allergen Reactions  . Iodinated Diagnostic Agents Shortness Of Breath  . Chocolate     Hives, itching  . Chocolate Flavor Other (See  Comments)    Hives, itching  . Iohexol     CP and SOB  . Peanut-Containing Drug Products Swelling    cashews  . Pineapple     Prior to Admission medications   Medication Sig Start Date End Date Taking? Authorizing Provider  aspirin EC 81 MG tablet Take 1 tablet (81 mg total) by mouth daily. 04/21/14  Yes Dunn, Ryan M, PA-C  Cyanocobalamin (B-12) 1000 MCG SUBL Place 1 tablet under the tongue daily. 08/01/17  Yes Sowles, Drue Stager, MD  famotidine (PEPCID) 20 MG tablet Take 1 tablet (20 mg total) by mouth 2 (two) times daily. 06/17/17 06/17/18 Yes Darel Hong, MD  ferrous sulfate 325 (65 FE) MG EC tablet Take 325 mg by mouth 3 (three) times daily with meals.   Yes [provider]  SUMAtriptan (IMITREX) 50 MG tablet Take by mouth daily as needed for migraine.  10/10/16  Yes [provider]  topiramate (TOPAMAX) 100 MG tablet Take by mouth daily as needed.  10/10/16  Yes [provider]    Past Medical, Surgical Family and Social History reviewed and updated.    Objective:   Today's Vitals   10/31/17 0946 10/31/17 1018  BP: (!) 150/100 (!) 142/100  Pulse: 92   Temp: 98.4 F (36.9 C)   SpO2: 98%   Weight: 217 lb (98.4 kg)     Wt Readings from Last 3 Encounters:  10/31/17 217 lb (98.4 kg)  09/27/17 220 lb (99.8 kg)  08/01/17 220 lb 6.4 oz (100 kg)    Physical Exam  Constitutional: She is oriented to person, place, and time. She appears well-developed and well-nourished.  HENT:  Nose: Mucosal edema and rhinorrhea present.  Mouth/Throat: Oropharyngeal exudate, posterior oropharyngeal edema and posterior oropharyngeal erythema present.  Cardiovascular: Normal rate, regular rhythm, normal heart sounds and intact distal pulses.  Pulmonary/Chest: Effort normal and breath sounds normal.  Lymphadenopathy:    She has cervical adenopathy.  Neurological: She is alert and oriented to person, place, and time.  Skin: Skin is warm and dry.  Psychiatric: She has a  normal mood and affect. Her behavior is normal. Judgment and thought content normal.   Assessment & Plan:  1. Essential hypertension, elevated today x 3 separate measurements. Previously medically managed although patient developed hypotension and medication was discontinued. Advised patient to follow-up with PCP for further evaluation of blood pressure and possible medication  therapy. Discussed at length the long-term effects of uncontrolled hypertension on overall cardiovascular health. 2. Pharyngitis, unspecified etiology 3. Upper respiratory tract infection, unspecified type Treating based on symptoms and physical exam. Rapid Strep negative. Patient has bilateral cervical lymphadenopathy and exudate present on exam. Suspect URI symptoms related environmental allergies therefore recommend starting long-term antihistamine therapy.  Medications ordered as follows:  Meds ordered this encounter  Medications  . levocetirizine (XYZAL) 5 MG tablet    Sig: Take 1 tablet (5 mg total) by mouth every evening.    Dispense:  30 tablet    Refill:  2  . amoxicillin-clavulanate (AUGMENTIN) 875-125 MG tablet    Sig: Take 1 tablet by mouth 2 (two) times daily.    Dispense:  20 tablet    Refill:  0  . fluconazole (DIFLUCAN) 150 MG tablet    Sig: Take 1 tablet (150 mg total) by mouth once for 1 dose.    Dispense:  1 tablet    Refill:  0    RTC: If symptoms worsen or do not improve.   Carroll Sage. Kenton Kingfisher, MSN, [address] [place], [place] [address] [phone]

## 2017-11-05 ENCOUNTER — Telehealth: Payer: Self-pay | Admitting: Emergency Medicine

## 2017-11-05 ENCOUNTER — Other Ambulatory Visit: Payer: Self-pay | Admitting: Family Medicine

## 2017-11-05 MED ORDER — BENZONATATE 100 MG PO CAPS: 100.0000 mg | ORAL_CAPSULE | Freq: Three times a day (TID) | ORAL | 0 refills | Status: DC | PRN

## 2017-11-05 NOTE — Telephone Encounter (Signed)
Patient returned call and stated that she is doing much better still taking antibiotic but now has a persist cough that will not go away. Was wondering she can get something for cough. Uses [facility] pharmacy. Please advise

## 2017-11-05 NOTE — Progress Notes (Signed)
Benzonatate 100-200 mg up to 3 times daily ordered and sent to [facility].

## 2017-11-05 NOTE — Telephone Encounter (Signed)
Left message following up on patient visit with [facility]. 

## 2017-11-05 NOTE — Progress Notes (Signed)
Spoke with patient informed her to get delsym (OTC) and see how that works for her and if no better will have to be evaluated for cough . Since patient stated that the tessalon perls does not work.

## 2017-11-13 ENCOUNTER — Ambulatory Visit (INDEPENDENT_AMBULATORY_CARE_PROVIDER_SITE_OTHER): Payer: 59 | Admitting: Family Medicine

## 2017-11-13 ENCOUNTER — Encounter: Payer: Self-pay | Admitting: Family Medicine

## 2017-11-13 VITALS — BP 136/84 | HR 96 | Temp 98.3°F | Resp 16 | Ht 62.0 in | Wt 225.2 lb

## 2017-11-13 DIAGNOSIS — R197 Diarrhea, unspecified: Secondary | ICD-10-CM | POA: Diagnosis not present

## 2017-11-13 DIAGNOSIS — J069 Acute upper respiratory infection, unspecified: Secondary | ICD-10-CM

## 2017-11-13 DIAGNOSIS — J208 Acute bronchitis due to other specified organisms: Secondary | ICD-10-CM

## 2017-11-13 DIAGNOSIS — J209 Acute bronchitis, unspecified: Secondary | ICD-10-CM

## 2017-11-13 MED ORDER — HYDROCOD POLST-CPM POLST ER 10-8 MG/5ML PO SUER: 5.0000 mL | Freq: Every evening | ORAL | 0 refills | Status: DC | PRN

## 2017-11-13 MED ORDER — PREDNISONE 10 MG PO TABS
10.0000 mg | ORAL_TABLET | Freq: Two times a day (BID) | ORAL | 0 refills | Status: DC
Start: 2017-11-13 — End: 2018-01-02

## 2017-11-13 MED ORDER — BUDESONIDE-FORMOTEROL FUMARATE 160-4.5 MCG/ACT IN AERO: 2.0000 | INHALATION_SPRAY | Freq: Two times a day (BID) | RESPIRATORY_TRACT | 0 refills | Status: DC

## 2017-11-13 NOTE — Progress Notes (Signed)
Name: Marie Vaughan   MRN: [id number]    DOB: [birth year]   Date:11/13/2017       Progress Note  Subjective  Chief Complaint  Chief Complaint  Patient presents with  . Cough    Onset-3 weeks, has been sweating profusely, dry cough with a hacking sound, right part of head is sore, nasal drainage, headaches. Patient has tried Robitussin And Mucinex with no relief-constantly coughing    HPI  Bronchitis: she was treated for strep about 2 weeks , finished antibiotics on Sunday, however now has a dry cough, in spells, keeps her up at night, tessalon pereles do not work for her. She denies wheezing but has SOB when she has a coughing spells. Sore throat resolved. She has noticed diarrhea since started on antibiotics. No abdominal cramping or blood in stools. No previous history of asthma or tobacco use.    Patient Active Problem List   Diagnosis Date Noted  . Colon cancer screening   . Polyp of sigmoid colon   . Benign neoplasm of transverse colon   . Iron deficiency anemia   . Metabolic syndrome 25/42/7062  . OSA on CPAP 02/03/2016  . 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 Abnormal brain MRI 12/20/2015  . Dizziness 12/13/2015  . Near syncope 12/10/2015  . Atypical chest pain 08/22/2013  . Essential hypertension 08/22/2013  . Angio-edema   . GERD (gastroesophageal reflux disease)   . Migraine with aura and without status migrainosus   . Acne   . Anxiety   . Allergic rhinitis 10/05/2011  . Insomnia 08/30/2011  . Obesity 08/30/2011    Past Surgical History:  Procedure Laterality Date  . ABDOMINAL HYSTERECTOMY  04-25-10   Due to abnormal PAP  . CARDIAC CATHETERIZATION  04/2014   [facility]  . COLONOSCOPY WITH PROPOFOL N/A 07/11/2017   Procedure: COLONOSCOPY WITH PROPOFOL;  Surgeon: [name], MD;  Location: [facility];  Service: Endoscopy;  Laterality: N/A;  . DILATION AND CURETTAGE OF UTERUS    . ESOPHAGOGASTRODUODENOSCOPY (EGD) WITH PROPOFOL N/A 07/11/2017   Procedure:  ESOPHAGOGASTRODUODENOSCOPY (EGD) WITH PROPOFOL;  Surgeon: [name], MD;  Location: [facility];  Service: Endoscopy;  Laterality: N/A;  diabetic - diet controlled  . GIVENS CAPSULE STUDY N/A 09/23/2017   Procedure: GIVENS CAPSULE STUDY;  Surgeon: Virgel Manifold, MD;  Location: [facility];  Service: Endoscopy;  Laterality: N/A;  . KNEE ARTHROSCOPY Left 06/10/2013   Procedure: LEFT KNEE ARTHROSCOPY KNEE WITH PARTIAL MEDIAL MENISCECTOMY ;  Surgeon: Lorn Junes, MD;  Location: [facility];  Service: Orthopedics;  Laterality: Left;  xerofoam, 4x4's, webril, ace wrap, ice wrap  . KNEE ARTHROSCOPY WITH MEDIAL MENISECTOMY Right 06/10/2013   Procedure: RIGHT KNEE ARTHROSCOPY WITH MEDIAL MENISECTOMY;  Surgeon: Lorn Junes, MD;  Location: [facility];  Service: Orthopedics;  Laterality: Right;  partial lateral menisectomoy and chondroplasty  . OVARIAN CYST REMOVAL  04-25-10    Family History  Problem Relation Age of Onset  . Early death Mother        Murdered when pt was 31 years old  . Diabetes Mother   . Hypertension Mother   . Gout Mother   . Kidney failure Mother   . Early death Sister 74       due to hemorage  . Alcohol abuse Maternal Uncle   . Alcohol abuse Other   . Arthritis Other   . Heart disease Other   . Other Other  cousin- phlebitis  . Diabetes Maternal Aunt   . Hyperlipidemia Maternal Aunt   . Hypertension Maternal Aunt   . Prostate cancer Maternal Uncle   . Breast cancer Neg Hx     Social History   Socioeconomic History  . Marital status: Married    Spouse name: Not on file  . Number of children: 3  . Years of education: Not on file  . Highest education level: Not on file  Occupational History  . Occupation: Proofreader: [employer]  . Financial resource strain: Not on file  . Food insecurity:    Worry: Not on file    Inability: Not on file  . Transportation needs:     Medical: Not on file    Non-medical: Not on file  Tobacco Use  . Smoking status: Never Smoker  . Smokeless tobacco: Never Used  Substance and Sexual Activity  . Alcohol use: No    Alcohol/week: 0.0 oz  . Drug use: No  . Sexual activity: Yes    Partners: Male  Lifestyle  . Physical activity:    Days per week: Not on file    Minutes per session: Not on file  . Stress: Not on file  Relationships  . Social connections:    Talks on phone: Not on file    Gets together: Not on file    Attends religious service: Not on file    Active member of club or organization: Not on file    Attends meetings of clubs or organizations: Not on file    Relationship status: Not on file  . Intimate partner violence:    Fear of current or ex partner: Not on file    Emotionally abused: Not on file    Physically abused: Not on file    Forced sexual activity: Not on file  Other Topics Concern  . Not on file  Social History Narrative   Lives in [place] with husband, two children gone . Daughter's are grown and out of the house. Her 49 yo is going to college              Current Outpatient Medications:  .  aspirin EC 81 MG tablet, Take 1 tablet (81 mg total) by mouth daily., Disp: 90 tablet, Rfl: 3 .  Cyanocobalamin (B-12) 1000 MCG SUBL, Place 1 tablet under the tongue daily., Disp: 90 each, Rfl: 1 .  ferrous sulfate 325 (65 FE) MG EC tablet, Take 325 mg by mouth daily with breakfast. , Disp: , Rfl:  .  levocetirizine (XYZAL) 5 MG tablet, Take 1 tablet (5 mg total) by mouth every evening., Disp: 30 tablet, Rfl: 2 .  SUMAtriptan (IMITREX) 50 MG tablet, Take by mouth daily as needed for migraine. , Disp: , Rfl:  .  topiramate (TOPAMAX) 100 MG tablet, Take by mouth daily as needed. , Disp: , Rfl:  .  amoxicillin-clavulanate (AUGMENTIN) 875-125 MG tablet, Take 1 tablet by mouth 2 (two) times daily. (Patient not taking: Reported on 11/13/2017), Disp: 20 tablet, Rfl: 0 .  benzonatate (TESSALON) 100 MG  capsule, Take 1-2 capsules (100-200 mg total) by mouth 3 (three) times daily as needed for cough. (Patient not taking: Reported on 11/13/2017), Disp: 40 capsule, Rfl: 0 .  famotidine (PEPCID) 20 MG tablet, Take 1 tablet (20 mg total) by mouth 2 (two) times daily. (Patient not taking: Reported on 11/13/2017), Disp: 60 tablet, Rfl: 0  Allergies  Allergen Reactions  .  Iodinated Diagnostic Agents Shortness Of Breath  . Chocolate     Hives, itching  . Chocolate Flavor Other (See Comments)    Hives, itching  . Iohexol     CP and SOB  . Peanut-Containing Drug Products Swelling    cashews  . Pineapple      ROS  Ten systems reviewed and is negative except as mentioned in HPI   Objective  Vitals:   11/13/17 1134  BP: 136/84  Pulse: 96  Resp: 16  Temp: 98.3 F (36.8 C)  TempSrc: Oral  SpO2: 97%  Weight: 225 lb 3.2 oz (102.2 kg)  Height: 5\' 2"  (1.575 m)    Body mass index is 41.19 kg/m.  Physical Exam  Constitutional: Patient appears well-developed and well-nourished. Obese  No distress.  HEENT: head atraumatic, normocephalic, pupils equal and reactive to light, ears : normal exam,  neck supple, throat within normal limits Cardiovascular: Normal rate, regular rhythm and normal heart sounds.  No murmur heard. No BLE edema. Pulmonary/Chest: Effort normal and breath sounds normal. No respiratory distress. Abdominal: Soft.  There is no tenderness. Psychiatric: Patient has a normal mood and affect. behavior is normal. Judgment and thought content normal.  Recent Results (from the past 2160 hour(s))  Ferritin     Status: None   Collection Time: 09/27/17 12:24 PM  Result Value Ref Range   Ferritin 42 11 - 307 ng/mL    Comment: Performed at [facility], [address] [place], [place] [address]  CBC with Differential     Status: Abnormal   Collection Time: 09/27/17 12:24 PM  Result Value Ref Range   WBC 6.6 3.6 - 11.0 K/uL   RBC 4.63 3.80 - 5.20 MIL/uL   Hemoglobin  12.4 12.0 - 16.0 g/dL   HCT 37.2 35.0 - 47.0 %   MCV 80.5 80.0 - 100.0 fL   MCH 26.9 26.0 - 34.0 pg   MCHC 33.4 32.0 - 36.0 g/dL   RDW 14.9 (H) 11.5 - 14.5 %   Platelets 322 150 - 440 K/uL   Neutrophils Relative % 61 %   Neutro Abs 4.0 1.4 - 6.5 K/uL   Lymphocytes Relative 31 %   Lymphs Abs 2.0 1.0 - 3.6 K/uL   Monocytes Relative 7 %   Monocytes Absolute 0.5 0.2 - 0.9 K/uL   Eosinophils Relative 1 %   Eosinophils Absolute 0.1 0 - 0.7 K/uL   Basophils Relative 0 %   Basophils Absolute 0.0 0 - 0.1 K/uL    Comment: Performed at [facility], [address] [place], [place] [address]  Iron and TIBC     Status: Abnormal   Collection Time: 09/27/17 12:24 PM  Result Value Ref Range   Iron 26 (L) 28 - 170 ug/dL   TIBC 402 250 - 450 ug/dL   Saturation Ratios 7 (L) 10.4 - 31.8 %   UIBC 376 ug/dL    Comment: Performed at [facility], [address] [place], [place] [address]  POCT rapid strep A     Status: Normal   Collection Time: 10/31/17 10:01 AM  Result Value Ref Range   Rapid Strep A Screen Negative Negative      PHQ2/9: Depression screen [facility] 2/9 02/11/2017 03/05/2016 12/05/2015 04/20/2015  Decreased Interest 0 0 0 0  Down, Depressed, Hopeless 0 0 0 0  PHQ - 2 Score 0 0 0 0     Fall Risk: Fall Risk  10/09/2017 02/11/2017 03/05/2016 12/05/2015 04/20/2015  Falls in the past year? Yes Yes No No No  Number falls in past yr: 2 or more 2 or more - - -  Injury with Fall? No No - - -  Risk Factor Category  - High Fall Risk - - -  Risk for fall due to : Other (Comment) - - - -  Risk for fall due to: Comment anemia/headaches- being followed by Dr. Teresa Pelton. - - - -  Follow up Education provided;Falls prevention discussed - - - -     Functional Status Survey: Is the patient deaf or have difficulty hearing?: No Does the patient have difficulty seeing, even when wearing glasses/contacts?: No Does the patient have difficulty concentrating, remembering, or making  decisions?: No Does the patient have difficulty walking or climbing stairs?: No Does the patient have difficulty dressing or bathing?: No Does the patient have difficulty doing errands alone such as visiting a doctor's office or shopping?: No    Assessment & Plan  1. Acute bronchitis due to infection  - chlorpheniramine-HYDROcodone (TUSSIONEX PENNKINETIC ER) 10-8 MG/5ML SUER; Take 5 mLs by mouth at bedtime as needed.  Dispense: 140 mL; Refill: 0 - budesonide-formoterol (SYMBICORT) 160-4.5 MCG/ACT inhaler; Inhale 2 puffs into the lungs 2 (two) times daily.  Dispense: 1 Inhaler; Refill: 0 - predniSONE (DELTASONE) 10 MG tablet; Take 1 tablet (10 mg total) by mouth 2 (two) times daily with a meal.  Dispense: 10 tablet; Refill: 0  2. Recent URI   3. Diarrhea, unspecified type  Diarrhea since started on antibiotics since started on augmentin about 10 days ago, no cramping or blood in stools, advised probiotics and may try imodium also

## 2017-11-19 ENCOUNTER — Ambulatory Visit: Admission: EM | Admit: 2017-11-19 | Discharge: 2017-11-19 | Discharge status: Absent without leave | Payer: 59

## 2017-11-19 ENCOUNTER — Ambulatory Visit (INDEPENDENT_AMBULATORY_CARE_PROVIDER_SITE_OTHER): Payer: 59

## 2017-11-19 ENCOUNTER — Other Ambulatory Visit: Payer: Self-pay

## 2017-11-19 ENCOUNTER — Ambulatory Visit
Admission: EM | Admit: 2017-11-19 | Discharge: 2017-11-19 | Disposition: A | Discharge status: Home / Self Care | Payer: 59 | Attending: Family Medicine | Admitting: Family Medicine

## 2017-11-19 DIAGNOSIS — R05 Cough: Secondary | ICD-10-CM

## 2017-11-19 DIAGNOSIS — R0789 Other chest pain: Secondary | ICD-10-CM

## 2017-11-19 DIAGNOSIS — J069 Acute upper respiratory infection, unspecified: Secondary | ICD-10-CM | POA: Diagnosis not present

## 2017-11-19 MED ORDER — HYDROCOD POLST-CPM POLST ER 10-8 MG/5ML PO SUER: 5.0000 mL | Freq: Two times a day (BID) | ORAL | 0 refills | Status: DC

## 2017-11-19 MED ORDER — AZITHROMYCIN 250 MG PO TABS: 250.0000 mg | ORAL_TABLET | Freq: Every day | ORAL | 0 refills | Status: DC

## 2017-11-19 MED ORDER — ALBUTEROL SULFATE 108 (90 BASE) MCG/ACT IN AEPB: 2.0000 | INHALATION_SPRAY | RESPIRATORY_TRACT | 0 refills | Status: DC | PRN

## 2017-11-19 NOTE — ED Provider Notes (Addendum)
[facility]    CSN: [id number] Arrival date & time: 11/19/17  1707     History   Chief Complaint Chief Complaint  Patient presents with  . Cough    HPI Marie Vaughan is a 47 y.o. female.   HPI This is a 47 year old female who presents with a cough that she has had for 3 to 4 weeks.  It is nonproductive.  She has had no fever or chills.  She has lost her voice and her chest hurts when she coughs now.  It is interfering with her sleep.  She is on CPAP machine at nighttime husband awoke her this morning after she had gone into paroxysmal of coughing during the night.  She has tried [employer] which have not been helpful she has been prescribed Tussionex which has helped for nighttime use.  Primary care physician has placed her on Symbicort which she did not take due to the cost, but had just finished a course of prednisone.  None of this has helped as well.  Diagnosed with bronchitis of infectious origin by her primary care.  Not have any further antibiotics except for what she had taken for her strep throat previously.         Past Medical History:  Diagnosis Date  . Acne   . Acute bronchitis   . Angio-edema   . Anxiety   . Anxiety state, unspecified   . Cervicalgia   . Contact lens/glasses fitting    wears contacts or glasses  . Dysmenorrhea   . GERD (gastroesophageal reflux disease)   . History of cardiac cath    a. 04/29/2014: LM nl, LAD no dz, D1 no dz, D2 very small vessel, D3 very small vessel, LCx nl, OM1 very small vessel, OM2 medium sized vessel, OM 3 no dz, RCA no dz, PDA no dz   . History of stress test    a. Lexiscan 08/25/13: no sig ischemia, attenuation artifact noted, no sig WMA noted, EF 69%, no EKG changes concerning for ischemia  . HTN (hypertension)   . Iron deficiency anemia secondary to blood loss (chronic)   . Migraine with aura, without mention of intractable migraine without mention of status migrainosus    "vestibular" per pt  .  Obesity   . 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 Pain in thoracic spine   . Pharyngitis   . Tear of medial meniscus of left knee   . Tear of medial meniscus of right knee   . Vaginal delivery    x 3  . Wears contact lenses     Patient Active Problem List   Diagnosis Date Noted  . Colon cancer screening   . Polyp of sigmoid colon   . Benign neoplasm of transverse colon   . Iron deficiency anemia   . Metabolic syndrome 50/93/2671  . OSA on CPAP 02/03/2016  . 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 Abnormal brain MRI 12/20/2015  . Dizziness 12/13/2015  . Near syncope 12/10/2015  . Atypical chest pain 08/22/2013  . Essential hypertension 08/22/2013  . Angio-edema   . GERD (gastroesophageal reflux disease)   . Migraine with aura and without status migrainosus   . Acne   . Anxiety   . Allergic rhinitis 10/05/2011  . Insomnia 08/30/2011  . Obesity 08/30/2011    Past Surgical History:  Procedure Laterality Date  . ABDOMINAL HYSTERECTOMY  04-25-10   Due to abnormal PAP  . CARDIAC CATHETERIZATION  04/2014  [facility]  . COLONOSCOPY WITH PROPOFOL N/A 07/11/2017   Procedure: COLONOSCOPY WITH PROPOFOL;  Surgeon: [name], MD;  Location: [place];  Service: Endoscopy;  Laterality: N/A;  . DILATION AND CURETTAGE OF UTERUS    . ESOPHAGOGASTRODUODENOSCOPY (EGD) WITH PROPOFOL N/A 07/11/2017   Procedure: ESOPHAGOGASTRODUODENOSCOPY (EGD) WITH PROPOFOL;  Surgeon: [name], MD;  Location: [place];  Service: Endoscopy;  Laterality: N/A;  diabetic - diet controlled  . GIVENS CAPSULE STUDY N/A 09/23/2017   Procedure: GIVENS CAPSULE STUDY;  Surgeon: Virgel Manifold, MD;  Location: [facility];  Service: Endoscopy;  Laterality: N/A;  . KNEE ARTHROSCOPY Left 06/10/2013   Procedure: LEFT KNEE ARTHROSCOPY KNEE WITH PARTIAL MEDIAL MENISCECTOMY ;  Surgeon: Lorn Junes, MD;  Location: [place];  Service: Orthopedics;  Laterality: Left;   xerofoam, 4x4's, webril, ace wrap, ice wrap  . KNEE ARTHROSCOPY WITH MEDIAL MENISECTOMY Right 06/10/2013   Procedure: RIGHT KNEE ARTHROSCOPY WITH MEDIAL MENISECTOMY;  Surgeon: Lorn Junes, MD;  Location: [place];  Service: Orthopedics;  Laterality: Right;  partial lateral menisectomoy and chondroplasty  . OVARIAN CYST REMOVAL  04-25-10    OB History    Gravida  4   Para  3   Term  3   Preterm  0   AB  1   Living  3     SAB      TAB      Ectopic      Multiple      Live Births               Home Medications    Prior to Admission medications   Medication Sig Start Date End Date Taking? Authorizing Provider  aspirin EC 81 MG tablet Take 1 tablet (81 mg total) by mouth daily. 04/21/14  Yes Dunn, Ryan M, PA-C  Cyanocobalamin (B-12) 1000 MCG SUBL Place 1 tablet under the tongue daily. 08/01/17  Yes Sowles, Drue Stager, MD  famotidine (PEPCID) 20 MG tablet Take 1 tablet (20 mg total) by mouth 2 (two) times daily. 06/17/17 06/17/18 Yes Darel Hong, MD  ferrous sulfate 325 (65 FE) MG EC tablet Take 325 mg by mouth daily with breakfast.    Yes [provider]  levocetirizine (XYZAL) 5 MG tablet Take 1 tablet (5 mg total) by mouth every evening. 10/31/17  Yes Scot Jun, FNP  SUMAtriptan (IMITREX) 50 MG tablet Take by mouth daily as needed for migraine.  10/10/16  Yes [provider]  topiramate (TOPAMAX) 100 MG tablet Take by mouth daily as needed.  10/10/16  Yes [provider]  Albuterol Sulfate (PROAIR RESPICLICK) 301 (90 Base) MCG/ACT AEPB Inhale 2 puffs into the lungs every 4 (four) hours as needed (Shortness of breath or wheezing). Use with a spacer 11/19/17   Lorin Picket, PA-C  azithromycin (ZITHROMAX) 250 MG tablet Take 1 tablet (250 mg total) by mouth daily. Take first 2 tablets together, then 1 every day until finished. 11/19/17   Lorin Picket, PA-C  budesonide-formoterol (SYMBICORT) 160-4.5 MCG/ACT inhaler  Inhale 2 puffs into the lungs 2 (two) times daily. 11/13/17   Steele Sizer, MD  chlorpheniramine-HYDROcodone (TUSSIONEX PENNKINETIC ER) 10-8 MG/5ML SUER Take 5 mLs by mouth 2 (two) times daily. 11/19/17   Lorin Picket, PA-C  predniSONE (DELTASONE) 10 MG tablet Take 1 tablet (10 mg total) by mouth 2 (two) times daily with a meal. 11/13/17   Steele Sizer, MD    Family History  Family History  Problem Relation Age of Onset  . Early death Mother        Murdered when pt was 11 years old  . Diabetes Mother   . Hypertension Mother   . Gout Mother   . Kidney failure Mother   . Early death Sister 71       due to hemorage  . Alcohol abuse Maternal Uncle   . Alcohol abuse Other   . Arthritis Other   . Heart disease Other   . Other Other        cousin- phlebitis  . Diabetes Maternal Aunt   . Hyperlipidemia Maternal Aunt   . Hypertension Maternal Aunt   . Prostate cancer Maternal Uncle   . Breast cancer Neg Hx     Social History Social History   Tobacco Use  . Smoking status: Never Smoker  . Smokeless tobacco: Never Used  Substance Use Topics  . Alcohol use: No    Alcohol/week: 0.0 oz  . Drug use: No     Allergies   Iodinated diagnostic agents; Chocolate; Chocolate flavor; Iohexol; Peanut-containing drug products; and Pineapple   Review of Systems Review of Systems  Constitutional: Positive for activity change. Negative for appetite change, chills, fatigue and fever.  HENT: Positive for voice change.   Respiratory: Positive for cough, shortness of breath and wheezing.   All other systems reviewed and are negative.    Physical Exam Triage Vital Signs ED Triage Vitals  Enc Vitals Group     BP 11/19/17 1716 (!) 151/83     Pulse Rate 11/19/17 1716 84     Resp 11/19/17 1716 16     Temp 11/19/17 1716 98.1 F (36.7 C)     Temp Source 11/19/17 1716 Oral     SpO2 11/19/17 1716 100 %     Weight 11/19/17 1719 218 lb (98.9 kg)     Height 11/19/17 1719 5\' 2"  (1.575 m)       Head Circumference --      Peak Flow --      Pain Score 11/19/17 1717 0     Pain Loc --      Pain Edu? --      Excl. in [place]? --    No data found.  Updated Vital Signs BP (!) 151/83 (BP Location: Left Arm)   Pulse 84   Temp 98.1 F (36.7 C) (Oral)   Resp 16   Ht 5\' 2"  (1.575 m)   Wt 218 lb (98.9 kg)   SpO2 100%   BMI 39.87 kg/m   Visual Acuity Right Eye Distance:   Left Eye Distance:   Bilateral Distance:    Right Eye Near:   Left Eye Near:    Bilateral Near:     Physical Exam  Constitutional: She is oriented to person, place, and time. She appears well-developed and well-nourished. No distress.  HENT:  Head: Normocephalic.  Nose: Nose normal.  Mouth/Throat: Oropharynx is clear and moist. No oropharyngeal exudate.  Both canals are occluded with cerumen  Eyes: Pupils are equal, round, and reactive to light. Right eye exhibits no discharge. Left eye exhibits no discharge.  Neck: Normal range of motion.  Pulmonary/Chest: Effort normal. She has wheezes.  Musculoskeletal: Normal range of motion.  Neurological: She is alert and oriented to person, place, and time.  Skin: Skin is warm and dry. She is not diaphoretic.  Psychiatric: She has a normal mood and affect. Her behavior is normal. Judgment and thought  content normal.  Nursing note and vitals reviewed.    UC Treatments / Results  Labs (all labs ordered are listed, but only abnormal results are displayed) Labs Reviewed - No data to display  EKG None  Radiology Dg Chest 2 View  Result Date: 11/19/2017 CLINICAL DATA:  Cough. EXAM: CHEST - 2 VIEW COMPARISON:  Radiographs of June 17, 2017. FINDINGS: The heart size and mediastinal contours are within normal limits. Both lungs are clear. No pneumothorax or pleural effusion is noted. The visualized skeletal structures are unremarkable. IMPRESSION: No active cardiopulmonary disease. Electronically Signed   By: Marijo Conception, M.D.   On: 11/19/2017 18:38     Procedures Procedures (including critical care time)  Medications Ordered in UC Medications - No data to display  Initial Impression / Assessment and Plan / UC Course  I have reviewed the triage vital signs and the nursing notes.  Pertinent labs & imaging results that were available during my care of the patient were reviewed by me and considered in my medical decision making (see chart for details).     Plan: 1. Test/x-ray results and diagnosis reviewed with patient 2. rx as per orders; risks, benefits, potential side effects reviewed with patient 3. Recommend supportive treatment with albuterol inhaler for shortness of breath or wheezing.  Since she has had a cough for a protracted amount of time I will place her on azithromycin.  Also refill her Tussionex since this seems to be helping her rest at nighttime.  If she continues to have her symptoms she should follow-up with her primary care physician. 4. F/u prn if symptoms worsen or don't improve  Final Clinical Impressions(s) / UC Diagnoses   Final diagnoses:  Upper respiratory tract infection, unspecified type   Discharge Instructions   None    ED Prescriptions    Medication Sig Dispense Auth. Provider   azithromycin (ZITHROMAX) 250 MG tablet Take 1 tablet (250 mg total) by mouth daily. Take first 2 tablets together, then 1 every day until finished. 6 tablet Lorin Picket, PA-C   chlorpheniramine-HYDROcodone (TUSSIONEX PENNKINETIC ER) 10-8 MG/5ML SUER Take 5 mLs by mouth 2 (two) times daily. 115 mL Crecencio Mc P, PA-C   Albuterol Sulfate (PROAIR RESPICLICK) 761 (90 Base) MCG/ACT AEPB Inhale 2 puffs into the lungs every 4 (four) hours as needed (Shortness of breath or wheezing). Use with a spacer 1 each Lorin Picket, PA-C     Controlled Substance Prescriptions [place] Controlled Substance Registry consulted? Not Applicable   Lorin Picket, PA-C 11/19/17 1900    Lorin Picket, PA-C 11/19/17 1901

## 2017-11-19 NOTE — ED Triage Notes (Signed)
Pt has had cough x 3-4 weeks. Was on Amoxil for possible strep throat prior to sx, and cough started after she finished her abx. She had tried [employer], Xyzal, without relief.

## 2017-12-10 ENCOUNTER — Telehealth: Payer: Self-pay | Admitting: Family Medicine

## 2017-12-10 NOTE — Telephone Encounter (Signed)
Pt is still coughing. Two weeks ago pt went to urgent care and had a chest xray but it came back normal. Pt does not know what else to do because OTC medication is not working. Please advise

## 2017-12-10 NOTE — Telephone Encounter (Signed)
Not sure why she is coughing so much, can she come in?  Does she want a referral to pulmonologist? She failed rx cough medication, Z-pack, prednisone, Symbicort and Tessalon perles

## 2017-12-11 NOTE — Telephone Encounter (Signed)
Was seen at [facility] in [facility] and was put on antibiotic, had cxr and cough medication. Still have cough.

## 2017-12-18 ENCOUNTER — Other Ambulatory Visit: Payer: Self-pay | Admitting: Family Medicine

## 2017-12-18 DIAGNOSIS — Z1231 Encounter for screening mammogram for malignant neoplasm of breast: Secondary | ICD-10-CM

## 2017-12-27 ENCOUNTER — Inpatient Hospital Stay: Payer: 59 | Attending: Oncology

## 2017-12-27 DIAGNOSIS — D509 Iron deficiency anemia, unspecified: Secondary | ICD-10-CM | POA: Insufficient documentation

## 2017-12-27 LAB — CBC WITH DIFFERENTIAL/PLATELET
Basophils Absolute: 0 10*3/uL (ref 0–0.1)
Basophils Relative: 1 %
Eosinophils Absolute: 0.1 10*3/uL (ref 0–0.7)
Eosinophils Relative: 1 %
HCT: 40 % (ref 35.0–47.0)
HEMOGLOBIN: 13.7 g/dL (ref 12.0–16.0)
LYMPHS ABS: 1.7 10*3/uL (ref 1.0–3.6)
LYMPHS PCT: 27 %
MCH: 29 pg (ref 26.0–34.0)
MCHC: 34.1 g/dL (ref 32.0–36.0)
MCV: 85.1 fL (ref 80.0–100.0)
MONOS PCT: 6 %
Monocytes Absolute: 0.4 10*3/uL (ref 0.2–0.9)
NEUTROS PCT: 65 %
Neutro Abs: 4.1 10*3/uL (ref 1.4–6.5)
Platelets: 275 10*3/uL (ref 150–440)
RBC: 4.7 MIL/uL (ref 3.80–5.20)
RDW: 15.8 % — ABNORMAL HIGH (ref 11.5–14.5)
WBC: 6.3 10*3/uL (ref 3.6–11.0)

## 2017-12-27 LAB — FERRITIN: Ferritin: 213 ng/mL (ref 11–307)

## 2017-12-27 LAB — IRON AND TIBC
Iron: 77 ug/dL (ref 28–170)
Saturation Ratios: 21 % (ref 10.4–31.8)
TIBC: 366 ug/dL (ref 250–450)
UIBC: 289 ug/dL

## 2018-01-02 ENCOUNTER — Ambulatory Visit
Admission: EM | Admit: 2018-01-02 | Discharge: 2018-01-02 | Disposition: A | Discharge status: Home / Self Care | Payer: 59 | Attending: Family Medicine | Admitting: Family Medicine

## 2018-01-02 ENCOUNTER — Telehealth: Payer: Self-pay | Admitting: Family Medicine

## 2018-01-02 ENCOUNTER — Encounter: Payer: Self-pay | Admitting: Emergency Medicine

## 2018-01-02 ENCOUNTER — Other Ambulatory Visit: Payer: Self-pay

## 2018-01-02 ENCOUNTER — Ambulatory Visit (INDEPENDENT_AMBULATORY_CARE_PROVIDER_SITE_OTHER): Admit: 2018-01-02 | Discharge: 2018-01-02 | Disposition: A | Discharge status: Home / Self Care | Payer: 59

## 2018-01-02 DIAGNOSIS — R310 Gross hematuria: Secondary | ICD-10-CM

## 2018-01-02 DIAGNOSIS — R300 Dysuria: Secondary | ICD-10-CM

## 2018-01-02 DIAGNOSIS — R109 Unspecified abdominal pain: Secondary | ICD-10-CM | POA: Diagnosis not present

## 2018-01-02 DIAGNOSIS — N939 Abnormal uterine and vaginal bleeding, unspecified: Secondary | ICD-10-CM

## 2018-01-02 DIAGNOSIS — R319 Hematuria, unspecified: Secondary | ICD-10-CM | POA: Diagnosis not present

## 2018-01-02 DIAGNOSIS — N83201 Unspecified ovarian cyst, right side: Secondary | ICD-10-CM

## 2018-01-02 DIAGNOSIS — N281 Cyst of kidney, acquired: Secondary | ICD-10-CM | POA: Diagnosis not present

## 2018-01-02 LAB — URINALYSIS, COMPLETE (UACMP) WITH MICROSCOPIC
BILIRUBIN URINE: NEGATIVE
GLUCOSE, UA: NEGATIVE mg/dL
KETONES UR: NEGATIVE mg/dL
NITRITE: POSITIVE — AB
PH: 6.5 (ref 5.0–8.0)
Protein, ur: NEGATIVE mg/dL
RBC / HPF: >50 RBC/hpf (ref 0–5)
Specific Gravity, Urine: 1.015 (ref 1.005–1.030)

## 2018-01-02 MED ORDER — PHENAZOPYRIDINE HCL 200 MG PO TABS: 200.0000 mg | ORAL_TABLET | Freq: Three times a day (TID) | ORAL | 0 refills | Status: DC

## 2018-01-02 MED ORDER — CEPHALEXIN 500 MG PO CAPS: 500.0000 mg | ORAL_CAPSULE | Freq: Two times a day (BID) | ORAL | 0 refills | Status: DC

## 2018-01-02 NOTE — ED Triage Notes (Signed)
Patient in today c/o abdominal pain, pain after urination and hematuria.

## 2018-01-02 NOTE — Discharge Instructions (Signed)
Drink plenty of fluids.  Arrange a follow-up with your primary care or GYN for next week.

## 2018-01-02 NOTE — Telephone Encounter (Signed)
gyn

## 2018-01-02 NOTE — Telephone Encounter (Signed)
Pt had hysterectomy a few years back. She is experenicing break through bleeding. Do the patient need to come in to be seen or are you able to do a referral to ob/gyn?

## 2018-01-02 NOTE — ED Provider Notes (Signed)
[facility]    CSN: [id number] Arrival date & time: 01/02/18  1752     History   Chief Complaint Chief Complaint  Patient presents with  . Abdominal Pain    HPI Marie Vaughan is a 47 y.o. female.   HPI  48 year old female presents with back pain that occurred this morning.  She thought this at first was a "catch."  She then developed abdominal pain in the suprapubic region that she has had after urination with straining and gross hematuria.  He has continued discomfort in the suprapubic region and has pain with initiation of urination which improves after completion of micturition.  Characterizes the pain as a burning pain.  No fever or chills.  No History of kidney stones in the past.  Status post hysterectomy in 2011.        Past Medical History:  Diagnosis Date  . Acne   . Acute bronchitis   . Angio-edema   . Anxiety   . Anxiety state, unspecified   . Cervicalgia   . Contact lens/glasses fitting    wears contacts or glasses  . Dysmenorrhea   . GERD (gastroesophageal reflux disease)   . History of cardiac cath    a. 04/29/2014: LM nl, LAD no dz, D1 no dz, D2 very small vessel, D3 very small vessel, LCx nl, OM1 very small vessel, OM2 medium sized vessel, OM 3 no dz, RCA no dz, PDA no dz   . History of stress test    a. Lexiscan 08/25/13: no sig ischemia, attenuation artifact noted, no sig WMA noted, EF 69%, no EKG changes concerning for ischemia  . HTN (hypertension)   . Iron deficiency anemia secondary to blood loss (chronic)   . Migraine with aura, without mention of intractable migraine without mention of status migrainosus    "vestibular" per pt  . Obesity   . 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 Pain in thoracic spine   . Pharyngitis   . Tear of medial meniscus of left knee   . Tear of medial meniscus of right knee   . Vaginal delivery    x 3  . Wears contact lenses     Patient Active Problem List   Diagnosis Date Noted  . Colon  cancer screening   . Polyp of sigmoid colon   . Benign neoplasm of transverse colon   . Iron deficiency anemia   . Metabolic syndrome 95/03/3266  . OSA on CPAP 02/03/2016  . 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 Abnormal brain MRI 12/20/2015  . Dizziness 12/13/2015  . Near syncope 12/10/2015  . Atypical chest pain 08/22/2013  . Essential hypertension 08/22/2013  . Angio-edema   . GERD (gastroesophageal reflux disease)   . Migraine with aura and without status migrainosus   . Acne   . Anxiety   . Allergic rhinitis 10/05/2011  . Insomnia 08/30/2011  . Obesity 08/30/2011    Past Surgical History:  Procedure Laterality Date  . ABDOMINAL HYSTERECTOMY  04-25-10   Due to abnormal PAP  . CARDIAC CATHETERIZATION  04/2014   [facility]  . COLONOSCOPY WITH PROPOFOL N/A 07/11/2017   Procedure: COLONOSCOPY WITH PROPOFOL;  Surgeon: [name], MD;  Location: [place];  Service: Endoscopy;  Laterality: N/A;  . DILATION AND CURETTAGE OF UTERUS    . ESOPHAGOGASTRODUODENOSCOPY (EGD) WITH PROPOFOL N/A 07/11/2017   Procedure: ESOPHAGOGASTRODUODENOSCOPY (EGD) WITH PROPOFOL;  Surgeon: [name], MD;  Location: [place];  Service: Endoscopy;  Laterality:  N/A;  diabetic - diet controlled  . GIVENS CAPSULE STUDY N/A 09/23/2017   Procedure: GIVENS CAPSULE STUDY;  Surgeon: Virgel Manifold, MD;  Location: [facility];  Service: Endoscopy;  Laterality: N/A;  . KNEE ARTHROSCOPY Left 06/10/2013   Procedure: LEFT KNEE ARTHROSCOPY KNEE WITH PARTIAL MEDIAL MENISCECTOMY ;  Surgeon: Lorn Junes, MD;  Location: [place];  Service: Orthopedics;  Laterality: Left;  xerofoam, 4x4's, webril, ace wrap, ice wrap  . KNEE ARTHROSCOPY WITH MEDIAL MENISECTOMY Right 06/10/2013   Procedure: RIGHT KNEE ARTHROSCOPY WITH MEDIAL MENISECTOMY;  Surgeon: Lorn Junes, MD;  Location: [place];  Service: Orthopedics;  Laterality: Right;  partial lateral menisectomoy and  chondroplasty  . OVARIAN CYST REMOVAL  04-25-10    OB History    Gravida  4   Para  3   Term  3   Preterm  0   AB  1   Living  3     SAB      TAB      Ectopic      Multiple      Live Births               Home Medications    Prior to Admission medications   Medication Sig Start Date End Date Taking? Authorizing Provider  Albuterol Sulfate (PROAIR RESPICLICK) 062 (90 Base) MCG/ACT AEPB Inhale 2 puffs into the lungs every 4 (four) hours as needed (Shortness of breath or wheezing). Use with a spacer 11/19/17  Yes Lorin Picket, PA-C  aspirin EC 81 MG tablet Take 1 tablet (81 mg total) by mouth daily. 04/21/14  Yes Dunn, Areta Haber, PA-C  chlorpheniramine-HYDROcodone (TUSSIONEX PENNKINETIC ER) 10-8 MG/5ML SUER Take 5 mLs by mouth 2 (two) times daily. 11/19/17  Yes Lorin Picket, PA-C  Cyanocobalamin (B-12) 1000 MCG SUBL Place 1 tablet under the tongue daily. 08/01/17  Yes Sowles, Drue Stager, MD  famotidine (PEPCID) 20 MG tablet Take 1 tablet (20 mg total) by mouth 2 (two) times daily. 06/17/17 06/17/18 Yes Darel Hong, MD  ferrous sulfate 325 (65 FE) MG EC tablet Take 325 mg by mouth daily with breakfast.    Yes [provider]  levocetirizine (XYZAL) 5 MG tablet Take 1 tablet (5 mg total) by mouth every evening. 10/31/17  Yes Scot Jun, FNP  budesonide-formoterol (SYMBICORT) 160-4.5 MCG/ACT inhaler Inhale 2 puffs into the lungs 2 (two) times daily. 11/13/17   Steele Sizer, MD  cephALEXin (KEFLEX) 500 MG capsule Take 1 capsule (500 mg total) by mouth 2 (two) times daily. 01/02/18   Lorin Picket, PA-C  phenazopyridine (PYRIDIUM) 200 MG tablet Take 1 tablet (200 mg total) by mouth 3 (three) times daily. 01/02/18   Lorin Picket, PA-C  SUMAtriptan (IMITREX) 50 MG tablet Take by mouth daily as needed for migraine.  10/10/16   [provider]  topiramate (TOPAMAX) 100 MG tablet Take by mouth daily as needed.  10/10/16   [provider]      Family History Family History  Problem Relation Age of Onset  . Early death Mother        Murdered when pt was 23 years old  . Diabetes Mother   . Hypertension Mother   . Gout Mother   . Kidney failure Mother   . Early death Sister 90       due to hemorage  . Alcohol abuse Maternal Uncle   . Alcohol abuse Other   .  Arthritis Other   . Heart disease Other   . Other Other        cousin- phlebitis  . Diabetes Maternal Aunt   . Hyperlipidemia Maternal Aunt   . Hypertension Maternal Aunt   . Prostate cancer Maternal Uncle   . Breast cancer Neg Hx     Social History Social History   Tobacco Use  . Smoking status: Never Smoker  . Smokeless tobacco: Never Used  Substance Use Topics  . Alcohol use: No    Alcohol/week: 0.0 oz  . Drug use: No     Allergies   Iodinated diagnostic agents; Chocolate; Chocolate flavor; Iohexol; Peanut-containing drug products; and Pineapple   Review of Systems Review of Systems  Constitutional: Positive for activity change. Negative for chills, diaphoresis, fatigue and fever.  Gastrointestinal: Positive for abdominal pain.  Genitourinary: Positive for dysuria, flank pain, frequency and hematuria. Negative for vaginal bleeding, vaginal discharge and vaginal pain.  All other systems reviewed and are negative.    Physical Exam Triage Vital Signs ED Triage Vitals  Enc Vitals Group     BP 01/02/18 1809 (!) 149/96     Pulse Rate 01/02/18 1809 92     Resp 01/02/18 1809 16     Temp 01/02/18 1809 99 F (37.2 C)     Temp Source 01/02/18 1809 Oral     SpO2 01/02/18 1809 99 %     Weight 01/02/18 1809 218 lb (98.9 kg)     Height 01/02/18 1809 5\' 2"  (1.575 m)     Head Circumference --      Peak Flow --      Pain Score 01/02/18 1808 7     Pain Loc --      Pain Edu? --      Excl. in [place]? --    No data found.  Updated Vital Signs BP (!) 149/96 (BP Location: Right Arm)   Pulse 92   Temp 99 F (37.2 C) (Oral)   Resp 16   Ht 5\' 2"   (1.575 m)   Wt 218 lb (98.9 kg)   SpO2 99%   BMI 39.87 kg/m   Visual Acuity Right Eye Distance:   Left Eye Distance:   Bilateral Distance:    Right Eye Near:   Left Eye Near:    Bilateral Near:     Physical Exam  Constitutional: She is oriented to person, place, and time. She appears well-developed and well-nourished.  Non-toxic appearance. She does not appear ill. No distress.  HENT:  Head: Normocephalic.  Eyes: Pupils are equal, round, and reactive to light.  Abdominal: Bowel sounds are normal. She exhibits no distension and no mass. There is tenderness in the suprapubic area.  Neurological: She is alert and oriented to person, place, and time.  Skin: Skin is warm and dry.  Psychiatric: She has a normal mood and affect. Her behavior is normal.  Nursing note and vitals reviewed.    UC Treatments / Results  Labs (all labs ordered are listed, but only abnormal results are displayed) Labs Reviewed  URINALYSIS, COMPLETE (UACMP) WITH MICROSCOPIC - Abnormal; Notable for the following components:      Result Value   APPearance HAZY (*)    Hgb urine dipstick LARGE (*)    Nitrite POSITIVE (*)    Leukocytes, UA SMALL (*)    Bacteria, UA MANY (*)    All other components within normal limits  URINE CULTURE    EKG None  Radiology Ct Abdomen  Pelvis Wo Contrast  Result Date: 01/02/2018 CLINICAL DATA:  Abdominal pain with urination. Hematuria. Right flank pain. EXAM: CT ABDOMEN AND PELVIS WITHOUT CONTRAST TECHNIQUE: Multidetector CT imaging of the abdomen and pelvis was performed following the standard protocol without IV contrast. COMPARISON:  03/16/2017.  04/22/2015. FINDINGS: Lower chest: Normal Hepatobiliary: Normal Pancreas: Pancreas is self appears normal. There is hazy density standing further out into the root of the mesentery with slightly prominent nodes. Spleen: Normal Adrenals/Urinary Tract: Adrenal glands are normal. Right kidney is normal. Left kidney is normal except  for a 2.5 cm cyst at the upper pole. No evidence of stone disease. The bladder is normal. Stomach/Bowel: No sign of obstruction, mass or inflammatory disease. Vascular/Lymphatic: Normal Reproductive: Previous hysterectomy. No pelvic mass. Right ovarian cyst, likely functional. Other: No free fluid or air. Musculoskeletal: Mild lower lumbar degenerative changes. IMPRESSION: Hazy opacity of the mesentery with slightly prominent nodes. Similar appearance to the study of last year and 2016. Therefore, this is probably not acute or significant. No evidence of urinary tract disease. 2.5 cm simple cyst upper pole left kidney. Ordinary lower lumbar degenerative changes. Right ovarian cyst, likely functional.  2.4 cm. Electronically Signed   By: Nelson Chimes M.D.   On: 01/02/2018 19:16    Procedures Procedures (including critical care time)  Medications Ordered in UC Medications - No data to display  Initial Impression / Assessment and Plan / UC Course  I have reviewed the triage vital signs and the nursing notes.  Pertinent labs & imaging results that were available during my care of the patient were reviewed by me and considered in my medical decision making (see chart for details).     Plan: 1. Test/x-ray results and diagnosis reviewed with patient 2. rx as per orders; risks, benefits, potential side effects reviewed with patient 3. Recommend supportive treatment with continued increased hydration.  The CAT scan with the patient showing that there is no evidence of stone disease.  Relatively unremarkable compared to previous CT.  We will treat her empirically for a UTI with the nitrite positivity and a hematuria.  Cultures will be available in 48 hours.  I have recommended that she follow-up with her primary care or GYN next week. 4. F/u prn if symptoms worsen or don't improve  Final Clinical Impressions(s) / UC Diagnoses   Final diagnoses:  Dysuria  Gross hematuria     Discharge Instructions      Drink plenty of fluids.  Arrange a follow-up with your primary care or GYN for next week.    ED Prescriptions    Medication Sig Dispense Auth. Provider   cephALEXin (KEFLEX) 500 MG capsule Take 1 capsule (500 mg total) by mouth 2 (two) times daily. 10 capsule Crecencio Mc P, PA-C   phenazopyridine (PYRIDIUM) 200 MG tablet Take 1 tablet (200 mg total) by mouth 3 (three) times daily. 6 tablet Lorin Picket, PA-C     Controlled Substance Prescriptions [place] Controlled Substance Registry consulted? Not Applicable   Lorin Picket, PA-C 01/02/18 1939

## 2018-01-03 ENCOUNTER — Other Ambulatory Visit: Payer: Self-pay | Admitting: Family Medicine

## 2018-01-05 LAB — URINE CULTURE: Culture: >=100000 — AB

## 2018-01-06 ENCOUNTER — Encounter: Payer: Self-pay | Admitting: Family Medicine

## 2018-01-07 ENCOUNTER — Telehealth (HOSPITAL_COMMUNITY): Payer: Self-pay

## 2018-01-07 NOTE — Telephone Encounter (Signed)
Urine culture was positive for Citrobacter Koseri and was given Keflex at urgent care visit. Attempted to reach patient. No answer at this time.

## 2018-01-14 ENCOUNTER — Telehealth: Payer: Self-pay | Admitting: Obstetrics & Gynecology

## 2018-01-14 NOTE — Telephone Encounter (Signed)
[facility] referring for vaginal bleeding. Called and left voicemail for patient to call back to reschedule

## 2018-01-15 ENCOUNTER — Ambulatory Visit
Admission: RE | Admit: 2018-01-15 | Discharge: 2018-01-15 | Disposition: A | Discharge status: Home / Self Care | Payer: 59 | Source: Ambulatory Visit | Attending: Family Medicine | Admitting: Family Medicine

## 2018-01-15 ENCOUNTER — Other Ambulatory Visit (HOSPITAL_COMMUNITY): Payer: Self-pay | Admitting: Orthopedic Surgery

## 2018-01-15 ENCOUNTER — Other Ambulatory Visit: Payer: Self-pay | Admitting: Orthopedic Surgery

## 2018-01-15 DIAGNOSIS — M25562 Pain in left knee: Principal | ICD-10-CM

## 2018-01-15 DIAGNOSIS — Z1231 Encounter for screening mammogram for malignant neoplasm of breast: Secondary | ICD-10-CM | POA: Diagnosis not present

## 2018-01-15 DIAGNOSIS — M722 Plantar fascial fibromatosis: Secondary | ICD-10-CM | POA: Diagnosis not present

## 2018-01-15 DIAGNOSIS — G8929 Other chronic pain: Secondary | ICD-10-CM

## 2018-01-15 DIAGNOSIS — S83242A Other tear of medial meniscus, current injury, left knee, initial encounter: Secondary | ICD-10-CM | POA: Diagnosis not present

## 2018-01-15 DIAGNOSIS — S83241A Other tear of medial meniscus, current injury, right knee, initial encounter: Secondary | ICD-10-CM | POA: Diagnosis not present

## 2018-01-28 NOTE — Telephone Encounter (Signed)
Patient is schedule 01/31/18 with [facility]

## 2018-01-29 ENCOUNTER — Ambulatory Visit
Admission: RE | Admit: 2018-01-29 | Discharge: 2018-01-29 | Disposition: A | Discharge status: Home / Self Care | Payer: 59 | Source: Ambulatory Visit | Attending: Orthopedic Surgery | Admitting: Orthopedic Surgery

## 2018-01-29 ENCOUNTER — Other Ambulatory Visit: Payer: Self-pay | Admitting: Orthopedic Surgery

## 2018-01-29 DIAGNOSIS — M769 Unspecified enthesopathy, lower limb, excluding foot: Secondary | ICD-10-CM | POA: Insufficient documentation

## 2018-01-29 DIAGNOSIS — M7052 Other bursitis of knee, left knee: Secondary | ICD-10-CM | POA: Insufficient documentation

## 2018-01-29 DIAGNOSIS — M25461 Effusion, right knee: Secondary | ICD-10-CM | POA: Insufficient documentation

## 2018-01-29 DIAGNOSIS — M25562 Pain in left knee: Secondary | ICD-10-CM | POA: Diagnosis not present

## 2018-01-29 DIAGNOSIS — M25561 Pain in right knee: Secondary | ICD-10-CM

## 2018-01-29 DIAGNOSIS — G8929 Other chronic pain: Secondary | ICD-10-CM

## 2018-01-29 DIAGNOSIS — M170 Bilateral primary osteoarthritis of knee: Secondary | ICD-10-CM | POA: Diagnosis not present

## 2018-01-30 ENCOUNTER — Ambulatory Visit: Payer: 59 | Admitting: Family Medicine

## 2018-01-31 ENCOUNTER — Encounter: Payer: Self-pay | Admitting: Obstetrics & Gynecology

## 2018-01-31 ENCOUNTER — Ambulatory Visit (INDEPENDENT_AMBULATORY_CARE_PROVIDER_SITE_OTHER): Payer: 59 | Admitting: Obstetrics & Gynecology

## 2018-01-31 VITALS — BP 150/96 | HR 93 | Ht 62.0 in | Wt 219.0 lb

## 2018-01-31 DIAGNOSIS — N83201 Unspecified ovarian cyst, right side: Secondary | ICD-10-CM

## 2018-01-31 DIAGNOSIS — R1011 Right upper quadrant pain: Secondary | ICD-10-CM

## 2018-01-31 DIAGNOSIS — Z90710 Acquired absence of both cervix and uterus: Secondary | ICD-10-CM | POA: Diagnosis not present

## 2018-01-31 DIAGNOSIS — N939 Abnormal uterine and vaginal bleeding, unspecified: Secondary | ICD-10-CM | POA: Diagnosis not present

## 2018-01-31 DIAGNOSIS — R310 Gross hematuria: Secondary | ICD-10-CM

## 2018-01-31 LAB — POCT URINALYSIS DIPSTICK
BILIRUBIN UA: NEGATIVE
GLUCOSE UA: NEGATIVE
KETONES UA: NEGATIVE
Leukocytes, UA: NEGATIVE
Nitrite, UA: NEGATIVE
PH UA: 6 (ref 5.0–8.0)
Protein, UA: NEGATIVE
RBC UA: NEGATIVE
Spec Grav, UA: 1.015 (ref 1.010–1.025)
UROBILINOGEN UA: 0.2 U/dL

## 2018-01-31 NOTE — Progress Notes (Signed)
Abdominal Pain, Blood per vagina Pt has prior h/o hysterectomy by Dr Laurey Morale in 2011.  She has done well since that time. Patient presents for evaluation of abdominal pain that has been intermmitant and mild and RUQ mostly since March.  She has undergone several evaluations.  Then in June she had episode of blood in toilet after she finished a urgent and somewhat painful urination.  It continued that day so she went to urgent care.  Dx UTI, culture pos for GBS and Citrabacter.  CT then normal for mass, stone, other.  (Did have 2 cm ovarian cyst on right).  Has had some bleeding after urination w wiping for a couple of weeks then stopped.   Still has mild sensitivity in the suprapubic region.  No radiation.  Rest makes it feel better.  Has taken full course of ABX.  No pain meds used or needed.  The pain is described as aching, and is 5/10 in intensity. Pain is located in the RUQ area with radiation to the right back. Onset was insidious occurring 4 months ago. Symptoms have been unchanged since. Aggravating factors: none. Alleviating factors: recumbency. Associated symptoms: none. The patient denies chills, fever and nausea. Risk factors for pelvic/abdominal pain include none.   PMHx: She  has a past medical history of Acne, Acute bronchitis, Angio-edema, Anxiety, Anxiety state, unspecified, Cervicalgia, Contact lens/glasses fitting, Dysmenorrhea, GERD (gastroesophageal reflux disease), History of cardiac cath, History of stress test, HTN (hypertension), Iron deficiency anemia secondary to blood loss (chronic), Migraine with aura, without mention of intractable migraine without mention of status migrainosus, Obesity, Other disorders of bone and cartilage(733.99), Pain in thoracic spine, Pharyngitis, Tear of medial meniscus of left knee, Tear of medial meniscus of right knee, Vaginal delivery, and Wears contact lenses. Also,  has a past surgical history that includes Ovarian cyst removal (04-25-10); Abdominal  hysterectomy (04-25-10); Knee arthroscopy with medial menisectomy (Right, 06/10/2013); Knee arthroscopy (Left, 06/10/2013); Dilation and curettage of uterus; Cardiac catheterization (04/2014); Colonoscopy with propofol (N/A, 07/11/2017); Esophagogastroduodenoscopy (egd) with propofol (N/A, 07/11/2017); and Givens capsule study (N/A, 09/23/2017)., family history includes Alcohol abuse in her maternal uncle and other; Arthritis in her other; Diabetes in her maternal aunt and mother; Early death in her mother; Early death (age of onset: 81) in her sister; Gout in her mother; Heart disease in her other; Hyperlipidemia in her maternal aunt; Hypertension in her maternal aunt and mother; Kidney failure in her mother; Other in her other; Prostate cancer in her maternal uncle.,  reports that she has never smoked. She has never used smokeless tobacco. She reports that she does not drink alcohol or use drugs.  She has a current medication list which includes the following prescription(s): albuterol sulfate, aspirin ec, b-12, famotidine, ferrous sulfate, sumatriptan, topiramate, budesonide-formoterol, cephalexin, chlorpheniramine-hydrocodone, levocetirizine, and phenazopyridine. Also, is allergic to iodinated diagnostic agents; chocolate; chocolate flavor; iohexol; peanut-containing drug products; and pineapple.  Review of Systems  Constitutional: Negative for chills, fever and malaise/fatigue.  HENT: Negative for congestion, sinus pain and sore throat.   Eyes: Negative for blurred vision and pain.  Respiratory: Negative for cough and wheezing.   Cardiovascular: Negative for chest pain and leg swelling.  Gastrointestinal: Negative for abdominal pain, constipation, diarrhea, heartburn, nausea and vomiting.  Genitourinary: Positive for dysuria, frequency, hematuria and urgency.  Musculoskeletal: Negative for back pain, joint pain, myalgias and neck pain.  Skin: Negative for itching and rash.  Neurological: Positive for  dizziness, tingling and headaches. Negative for tremors and weakness.  Endo/Heme/Allergies: Does not bruise/bleed easily.  Psychiatric/Behavioral: Negative for depression. The patient is not nervous/anxious and does not have insomnia.     Objective: BP (!) 150/96   Pulse 93   Ht 5\' 2"  (1.575 m)   Wt 219 lb (99.3 kg)   BMI 40.06 kg/m  Physical Exam  Constitutional: She is oriented to person, place, and time. She appears well-developed and well-nourished. No distress.  Genitourinary: Rectum normal and vagina normal. Pelvic exam was performed with patient supine. There is no rash or lesion on the right labia. There is no rash or lesion on the left labia. Vagina exhibits no lesion. No bleeding in the vagina. Right adnexum does not display mass and does not display tenderness. Left adnexum does not display mass and does not display tenderness.  Genitourinary Comments: Absent Uterus Absent cervix Vaginal cuff well healed No sign of bleeding No adnexal T or mass  HENT:  Head: Normocephalic and atraumatic. Head is without laceration.  Right Ear: Hearing normal.  Left Ear: Hearing normal.  Nose: No epistaxis.  No foreign bodies.  Mouth/Throat: Uvula is midline, oropharynx is clear and moist and mucous membranes are normal.  Eyes: Pupils are equal, round, and reactive to light.  Neck: Normal range of motion. Neck supple. No thyromegaly present.  Cardiovascular: Normal rate and regular rhythm. Exam reveals no gallop and no friction rub.  No murmur heard. Pulmonary/Chest: Effort normal and breath sounds normal. No respiratory distress. She has no wheezes.  Abdominal: Soft. Bowel sounds are normal. She exhibits no distension. There is no tenderness. There is no rebound.  Musculoskeletal: Normal range of motion.  Neurological: She is alert and oriented to person, place, and time. No cranial nerve deficit.  Skin: Skin is warm and dry.  Psychiatric: She has a normal mood and affect. Judgment  normal.  Vitals reviewed.  UA- NEG   ASSESSMENT/PLAN:   Problem List Items Addressed This Visit      Genitourinary   Gross hematuria - Primary   Relevant Orders   Urine Culture   POCT Urinalysis Dipstick (Completed)     Other   Vaginal bleeding   History of hysterectomy    Other Visit Diagnoses    Right upper quadrant abdominal pain       Right ovarian cyst       Relevant Orders   US PELVIS TRANSVANGINAL NON-OB (TV ONLY)    Pain likely unrelated to bleeding, preceded episode of bleeding and is more upper abdomen. Also, cyst is unlikely source of pain. No GYN source for bleeding, likely GU UA and culture to test for cure If lower quadrant pain or bloating persists then consider [place] to re-assess ovarian cyst.  A total of 60 minutes were spent face-to-face with the patient during this encounter and over half of that time dealt with counseling and coordination of care, in regards to bleeding, pain, cyst, menopause, and urinary infections.  Barnett Applebaum, MD, [facility], [facility] 01/31/2018  9:20 AM

## 2018-01-31 NOTE — Patient Instructions (Signed)
Urine Culture and Sensitivity Testing Why am I having this test? A urine culture is a test to see if germs grow from your urine sample. Normally, urine is free of germs (sterile). Germs in urine are usually bacteria. Sometimes they can be yeasts. These germs can cause a urinary tract infection (UTI). You may have this test if you have symptoms of a UTI. These may include:  Frequent urination.  Burning pain when passing urine.  If you are pregnant, your health care provider may order this test to screen you for a UTI. When you pass urine, the urine flows through the tube that empties your bladder (urethra). In men, urine comes out through an opening at the tip of the penis. In women, it comes out of the body from just above the vaginal opening. These areas may have bacteria near them that normally live on the skin (normal flora). What kind of sample is taken? A urine sample for a culture test must be collected in a way that keeps normal flora from getting into the sample. The method used most often is called a clean-catch sample. In a few cases, urine may need to be collected directly from the bladder using a thin, flexible tube (catheter). The health care provider puts the catheter through the person's urethra and into the bladder. Your urine sample will be placed onto plates containing a substance that encourages bacteria to grow (agar plates). These plates are kept at body temperature for 24-48 hours to see if bacteria or other germs grow. Then, a lab technician examines them under a microscope to check for germs. Any germs that grow from the culture will be tested against a variety of medicines to find the one that works best (sensitivity testing). For a UTI caused by bacteria, several types of antibiotic medicines may be tested. How do I prepare for this test?  Do not urinate for about an hour before collecting the sample.  Drink a glass of water about 20 minutes before collecting the  sample.  Tell your health care provider if you have been taking antibiotics. This may affect the results of your test. Your health care provider may give you sterile wipes to clean your vagina or penis to prepare for collecting a clean-catch sample. To collect the sample, you will need to do the following: For Women and Girls  Sit on the toilet and spread the lips of your vagina.  Use one wipe to clean your vaginal area from front to back.  Use a second wipe to clean the opening of your urethra.  Pass a small amount of urine directly into the toilet while still spreading your vagina.  Then, hold the sterile cup underneath you and urinate into it.  Fill the cup about halfway. Cap it and return it for testing. For Men and Boys  Use the sterile wipe to clean the tip of your penis.  Pass a small amount of urine directly into the toilet first.  Then, urinate into the sterile cup.  Fill the cup about halfway. Cap it and return it for testing. What do the results mean? The result of a urine culture and sensitivity test will be positive or negative.  If enough bacteria grow from your urine sample, your test result is considered positive.  If many different bacteria grow from your urine sample, your test may be reported as contaminated.  If no bacteria grow from your sample after 24-48 hours, your test result is considered negative.  Results  of sensitivity testing let your health care provider know which medicines to use to treat your infection.  If the results of your urine culture are negative, this means:  It is less likely that you have a UTI.  Your test may be repeated if you still have symptoms.  If the results of your urine culture are positive, this means:  It is more likely that you have a UTI.  You may need to start treatment based on your sensitivity results.  Talk to your health care provider to discuss your results, treatment options, and if necessary, the need  for more tests. It is your responsibility to obtain your test results. Ask the lab or department performing the test when and how you will get your results. Talk with your health care provider if you have any questions about your results. Talk with your health care provider to discuss your results, treatment options, and if necessary, the need for more tests. Talk with your health care provider if you have any questions about your results. This information is not intended to replace advice given to you by your health care provider. Make sure you discuss any questions you have with your health care provider. Document Released: 08/03/2004 Document Revised: 03/14/2016 Document Reviewed: 11/05/2013 Elsevier Interactive Patient Education  [employer].

## 2018-02-02 LAB — URINE CULTURE: Organism ID, Bacteria: NO GROWTH

## 2018-02-03 NOTE — Progress Notes (Signed)
Pt aware.

## 2018-02-03 NOTE — Progress Notes (Signed)
Let her know culture shows cleared UTI

## 2018-02-17 ENCOUNTER — Ambulatory Visit
Admission: EM | Admit: 2018-02-17 | Discharge: 2018-02-17 | Disposition: A | Discharge status: Home / Self Care | Payer: 59 | Attending: Family Medicine | Admitting: Family Medicine

## 2018-02-17 ENCOUNTER — Encounter: Payer: Self-pay | Admitting: Emergency Medicine

## 2018-02-17 ENCOUNTER — Other Ambulatory Visit: Payer: Self-pay

## 2018-02-17 DIAGNOSIS — H6123 Impacted cerumen, bilateral: Secondary | ICD-10-CM

## 2018-02-17 NOTE — ED Provider Notes (Addendum)
[facility]    CSN: [id number] Arrival date & time: 02/17/18  [place]     History   Chief Complaint Chief Complaint  Patient presents with  . Ear Fullness    HPI Marie Vaughan is a 47 y.o. female.   HPI  47 year old female presents with a right-sided ear fullness that she has had for 1 week.  Her primary  Advised her to use swimmer's ear over-the-counter medication but this did not provide her with any relief.  States that if she occludes her left ear she has very minimal hearing on the right.  She is had no tinnitus drainage or pain.        Past Medical History:  Diagnosis Date  . Acne   . Acute bronchitis   . Angio-edema   . Anxiety   . Anxiety state, unspecified   . Cervicalgia   . Contact lens/glasses fitting    wears contacts or glasses  . Dysmenorrhea   . GERD (gastroesophageal reflux disease)   . History of cardiac cath    a. 04/29/2014: LM nl, LAD no dz, D1 no dz, D2 very small vessel, D3 very small vessel, LCx nl, OM1 very small vessel, OM2 medium sized vessel, OM 3 no dz, RCA no dz, PDA no dz   . History of stress test    a. Lexiscan 08/25/13: no sig ischemia, attenuation artifact noted, no sig WMA noted, EF 69%, no EKG changes concerning for ischemia  . HTN (hypertension)   . Iron deficiency anemia secondary to blood loss (chronic)   . Migraine with aura, without mention of intractable migraine without mention of status migrainosus    "vestibular" per pt  . Obesity   . 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 Pain in thoracic spine   . Pharyngitis   . Tear of medial meniscus of left knee   . Tear of medial meniscus of right knee   . Vaginal delivery    x 3  . Wears contact lenses     Patient Active Problem List   Diagnosis Date Noted  . Gross hematuria 01/31/2018  . Vaginal bleeding 01/31/2018  . History of hysterectomy 01/31/2018  . Colon cancer screening   . Polyp of sigmoid colon   . Benign neoplasm of transverse colon   .  Iron deficiency anemia   . Metabolic syndrome 86/57/8469  . OSA on CPAP 02/03/2016  . 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 Abnormal brain MRI 12/20/2015  . Dizziness 12/13/2015  . Near syncope 12/10/2015  . Atypical chest pain 08/22/2013  . Essential hypertension 08/22/2013  . Angio-edema   . GERD (gastroesophageal reflux disease)   . Migraine with aura and without status migrainosus   . Acne   . Anxiety   . Allergic rhinitis 10/05/2011  . Insomnia 08/30/2011  . Obesity 08/30/2011    Past Surgical History:  Procedure Laterality Date  . ABDOMINAL HYSTERECTOMY  04-25-10   Due to abnormal PAP  . CARDIAC CATHETERIZATION  04/2014   [facility]  . COLONOSCOPY WITH PROPOFOL N/A 07/11/2017   Procedure: COLONOSCOPY WITH PROPOFOL;  Surgeon: [name], MD;  Location: [place];  Service: Endoscopy;  Laterality: N/A;  . DILATION AND CURETTAGE OF UTERUS    . ESOPHAGOGASTRODUODENOSCOPY (EGD) WITH PROPOFOL N/A 07/11/2017   Procedure: ESOPHAGOGASTRODUODENOSCOPY (EGD) WITH PROPOFOL;  Surgeon: [name], MD;  Location: [place];  Service: Endoscopy;  Laterality: N/A;  diabetic - diet controlled  . GIVENS CAPSULE STUDY N/A  09/23/2017   Procedure: GIVENS CAPSULE STUDY;  Surgeon: Virgel Manifold, MD;  Location: [facility];  Service: Endoscopy;  Laterality: N/A;  . KNEE ARTHROSCOPY Left 06/10/2013   Procedure: LEFT KNEE ARTHROSCOPY KNEE WITH PARTIAL MEDIAL MENISCECTOMY ;  Surgeon: Lorn Junes, MD;  Location: [place];  Service: Orthopedics;  Laterality: Left;  xerofoam, 4x4's, webril, ace wrap, ice wrap  . KNEE ARTHROSCOPY WITH MEDIAL MENISECTOMY Right 06/10/2013   Procedure: RIGHT KNEE ARTHROSCOPY WITH MEDIAL MENISECTOMY;  Surgeon: Lorn Junes, MD;  Location: [place];  Service: Orthopedics;  Laterality: Right;  partial lateral menisectomoy and chondroplasty  . OVARIAN CYST REMOVAL  04-25-10    OB History    Gravida  4   Para  3    Term  3   Preterm  0   AB  1   Living  3     SAB      TAB      Ectopic      Multiple      Live Births               Home Medications    Prior to Admission medications   Medication Sig Start Date End Date Taking? Authorizing Provider  Albuterol Sulfate (PROAIR RESPICLICK) 381 (90 Base) MCG/ACT AEPB Inhale 2 puffs into the lungs every 4 (four) hours as needed (Shortness of breath or wheezing). Use with a spacer 11/19/17  Yes Lorin Picket, PA-C  aspirin EC 81 MG tablet Take 1 tablet (81 mg total) by mouth daily. 04/21/14  Yes Dunn, Ryan M, PA-C  Cyanocobalamin (B-12) 1000 MCG SUBL Place 1 tablet under the tongue daily. 08/01/17  Yes Sowles, Drue Stager, MD  famotidine (PEPCID) 20 MG tablet Take 1 tablet (20 mg total) by mouth 2 (two) times daily. 06/17/17 06/17/18 Yes Darel Hong, MD  levocetirizine (XYZAL) 5 MG tablet Take 1 tablet (5 mg total) by mouth every evening. 10/31/17  Yes Scot Jun, FNP  SUMAtriptan (IMITREX) 50 MG tablet Take by mouth daily as needed for migraine.  10/10/16  Yes [provider]  topiramate (TOPAMAX) 100 MG tablet Take by mouth daily as needed.  10/10/16  Yes [provider]    Family History Family History  Problem Relation Age of Onset  . Early death Mother        Murdered when pt was 58 years old  . Diabetes Mother   . Hypertension Mother   . Gout Mother   . Kidney failure Mother   . Early death Sister 22       due to hemorage  . Alcohol abuse Maternal Uncle   . Alcohol abuse Other   . Arthritis Other   . Heart disease Other   . Other Other        cousin- phlebitis  . Diabetes Maternal Aunt   . Hyperlipidemia Maternal Aunt   . Hypertension Maternal Aunt   . Prostate cancer Maternal Uncle   . Breast cancer Neg Hx     Social History Social History   Tobacco Use  . Smoking status: Never Smoker  . Smokeless tobacco: Never Used  Substance Use Topics  . Alcohol use: No    Alcohol/week: 0.0 oz  . Drug  use: No     Allergies   Iodinated diagnostic agents; Chocolate; Chocolate flavor; Iohexol; Peanut-containing drug products; and Pineapple   Review of Systems Review of Systems  Constitutional: Negative for activity change, appetite change, chills,  fatigue and fever.  HENT: Positive for ear pain. Negative for ear discharge.   All other systems reviewed and are negative.    Physical Exam Triage Vital Signs ED Triage Vitals  Enc Vitals Group     BP 02/17/18 1838 (!) 144/95     Pulse Rate 02/17/18 1838 77     Resp 02/17/18 1838 18     Temp 02/17/18 1838 98.3 F (36.8 C)     Temp Source 02/17/18 1838 Oral     SpO2 02/17/18 1838 100 %     Weight 02/17/18 1831 221 lb (100.2 kg)     Height 02/17/18 1831 5\' 2"  (1.575 m)     Head Circumference --      Peak Flow --      Pain Score 02/17/18 1831 0     Pain Loc --      Pain Edu? --      Excl. in [place]? --    No data found.  Updated Vital Signs BP (!) 144/95 (BP Location: Left Arm)   Pulse 77   Temp 98.3 F (36.8 C) (Oral)   Resp 18   Ht 5\' 2"  (1.575 m)   Wt 221 lb (100.2 kg)   SpO2 100%   BMI 40.42 kg/m   Visual Acuity Right Eye Distance:   Left Eye Distance:   Bilateral Distance:    Right Eye Near:   Left Eye Near:    Bilateral Near:     Physical Exam  Constitutional: She is oriented to person, place, and time. She appears well-developed and well-nourished. No distress.  HENT:  Head: Normocephalic.   Both Ears are occluded with cerumen  Eyes: Pupils are equal, round, and reactive to light. Right eye exhibits no discharge. Left eye exhibits no discharge.  Neck: Normal range of motion.  Musculoskeletal: Normal range of motion.  Lymphadenopathy:    She has no cervical adenopathy.  Neurological: She is alert and oriented to person, place, and time.  Skin: Skin is warm and dry. She is not diaphoretic.  Psychiatric: She has a normal mood and affect. Her behavior is normal. Judgment and thought content normal.    Nursing note and vitals reviewed.    UC Treatments / Results  Labs (all labs ordered are listed, but only abnormal results are displayed) Labs Reviewed - No data to display  EKG None  Radiology No results found.  Procedures Bilateral ear lavage was performed.  Following the procedure both canals are now completely free of cerumen.  TMs appear normal.  Medications Ordered in UC Medications - No data to display  Initial Impression / Assessment and Plan / UC Course  I have reviewed the triage vital signs and the nursing notes.  Pertinent labs & imaging results that were available during my care of the patient were reviewed by me and considered in my medical decision making (see chart for details).     Plan: 1. Test/x-ray results and diagnosis reviewed with patient 2. rx as per orders; risks, benefits, potential side effects reviewed with patient 3. Recommend supportive treatment with a saturated cotton ball with mineral oil 5 minutes once a week to prevent buildup of earwax. 4. F/u prn if symptoms worsen or don't improve  Final Clinical Impressions(s) / UC Diagnoses   Final diagnoses:  Bilateral impacted cerumen   Discharge Instructions   None    ED Prescriptions    None     Controlled Substance Prescriptions [place] Controlled Substance Registry consulted? Not  Applicable      Lorin Picket, PA-C 02/17/18 1949

## 2018-02-17 NOTE — ED Triage Notes (Signed)
Patient c/o right side ear fullness x 1 week. Patient stated she used OTC swimmers ear with no relief.

## 2018-02-25 ENCOUNTER — Telehealth: Payer: Self-pay | Admitting: Family Medicine

## 2018-02-25 NOTE — Telephone Encounter (Signed)
Pt requesting refill on Xanax. [facility] send to [facility]

## 2018-02-25 NOTE — Telephone Encounter (Signed)
Spoke with pt and she is going on vacation and will call back to schedule appt when she returns

## 2018-02-25 NOTE — Telephone Encounter (Signed)
12/10/15 2200 End Date/Time: 12/15/15 1724 First Dose: As Scheduled    Order Status: Discontinued  Ordering User: Nicholes Mango, MD     Sorry patient will have to make an appointment per Dr. Ancil Boozer due to patient never being prescribed Xanax by her. Also patient has not had a active prescription since 2017. It was D/C by Dr. Margaretmary Eddy.

## 2018-03-05 ENCOUNTER — Ambulatory Visit (INDEPENDENT_AMBULATORY_CARE_PROVIDER_SITE_OTHER): Payer: 59 | Admitting: Obstetrics & Gynecology

## 2018-03-05 ENCOUNTER — Ambulatory Visit (INDEPENDENT_AMBULATORY_CARE_PROVIDER_SITE_OTHER): Payer: 59

## 2018-03-05 ENCOUNTER — Ambulatory Visit: Payer: 59 | Admitting: Obstetrics & Gynecology

## 2018-03-05 ENCOUNTER — Encounter: Payer: Self-pay | Admitting: Obstetrics & Gynecology

## 2018-03-05 ENCOUNTER — Other Ambulatory Visit: Payer: 59

## 2018-03-05 VITALS — BP 120/80 | Ht 62.0 in | Wt 215.0 lb

## 2018-03-05 DIAGNOSIS — R1011 Right upper quadrant pain: Secondary | ICD-10-CM

## 2018-03-05 DIAGNOSIS — R319 Hematuria, unspecified: Secondary | ICD-10-CM | POA: Diagnosis not present

## 2018-03-05 DIAGNOSIS — N83202 Unspecified ovarian cyst, left side: Secondary | ICD-10-CM

## 2018-03-05 DIAGNOSIS — N83201 Unspecified ovarian cyst, right side: Secondary | ICD-10-CM

## 2018-03-05 DIAGNOSIS — Z8742 Personal history of other diseases of the female genital tract: Secondary | ICD-10-CM | POA: Diagnosis not present

## 2018-03-05 DIAGNOSIS — N3001 Acute cystitis with hematuria: Secondary | ICD-10-CM

## 2018-03-05 LAB — POCT URINALYSIS DIPSTICK
Blood, UA: POSITIVE
Glucose, UA: NEGATIVE
Leukocytes, UA: NEGATIVE
Protein, UA: NEGATIVE
Spec Grav, UA: 1.01 (ref 1.010–1.025)
UROBILINOGEN UA: 0.2 U/dL
pH, UA: 5 (ref 5.0–8.0)

## 2018-03-05 MED ORDER — SULFAMETHOXAZOLE-TRIMETHOPRIM 800-160 MG PO TABS: 1.0000 | ORAL_TABLET | Freq: Two times a day (BID) | ORAL | 1 refills | Status: DC

## 2018-03-05 NOTE — Progress Notes (Signed)
HPI: Pt has had recetn hematuria, and it has recurred 2 days ago.  Was treated for UTI a few weeks ago.  Initial f/u culture was neg. Also, right ovarian cyst found several weeks ago by [place], although her pain sx's were more (and still are) RUQ. Ultrasound demonstrates cyst seen on Left ovary with normal right ovary.  Cyst 3 cm.  PMHx: She  has a past medical history of Acne, Acute bronchitis, Angio-edema, Anxiety, Anxiety state, unspecified, Cervicalgia, Contact lens/glasses fitting, Dysmenorrhea, GERD (gastroesophageal reflux disease), History of cardiac cath, History of stress test, HTN (hypertension), Iron deficiency anemia secondary to blood loss (chronic), Migraine with aura, without mention of intractable migraine without mention of status migrainosus, Obesity, Other disorders of bone and cartilage(733.99), Pain in thoracic spine, Pharyngitis, Tear of medial meniscus of left knee, Tear of medial meniscus of right knee, Vaginal delivery, and Wears contact lenses. Also,  has a past surgical history that includes Ovarian cyst removal (04-25-10); Abdominal hysterectomy (04-25-10); Knee arthroscopy with medial menisectomy (Right, 06/10/2013); Knee arthroscopy (Left, 06/10/2013); Dilation and curettage of uterus; Cardiac catheterization (04/2014); Colonoscopy with propofol (N/A, 07/11/2017); Esophagogastroduodenoscopy (egd) with propofol (N/A, 07/11/2017); and Givens capsule study (N/A, 09/23/2017)., family history includes Alcohol abuse in her maternal uncle and other; Arthritis in her other; Diabetes in her maternal aunt and mother; Early death in her mother; Early death (age of onset: 67) in her sister; Gout in her mother; Heart disease in her other; Hyperlipidemia in her maternal aunt; Hypertension in her maternal aunt and mother; Kidney failure in her mother; Other in her other; Prostate cancer in her maternal uncle.,  reports that she has never smoked. She has never used smokeless tobacco. She reports that  she does not drink alcohol or use drugs.  She has a current medication list which includes the following prescription(s): albuterol sulfate, aspirin ec, b-12, famotidine, levocetirizine, sumatriptan, topiramate, and sulfamethoxazole-trimethoprim. Also, is allergic to iodinated diagnostic agents; chocolate; chocolate flavor; iohexol; peanut-containing drug products; and pineapple.  Review of Systems  All other systems reviewed and are negative.  Objective: BP 120/80   Ht 5\' 2"  (1.575 m)   Wt 215 lb (97.5 kg)   BMI 39.32 kg/m   Physical examination Constitutional NAD, Conversant  Skin No rashes, lesions or ulceration.   Extremities: Moves all appropriately.  Normal ROM for age. No lymphadenopathy.  Neuro: Grossly intact  Psych: Oriented to PPT.  Normal mood. Normal affect.   Results for orders placed or performed in visit on 03/05/18  POCT urinalysis dipstick  Result Value Ref Range   Color, UA     Clarity, UA     Glucose, UA Negative Negative   Bilirubin, UA     Ketones, UA     Spec Grav, UA 1.010 1.010 - 1.025   Blood, UA positive    pH, UA 5.0 5.0 - 8.0   Protein, UA Negative Negative   Urobilinogen, UA 0.2 0.2 or 1.0 E.U./dL   Nitrite, UA     Leukocytes, UA Negative Negative   Appearance     Odor     Assessment:  History of right ovarian cyst Left ovarian cyst    - Incidental, not related to sx's    - Recheck if sx's occur and in 2 mos  Hematuria, unspecified type Acute cystitis with hematuria - Plan: Urine Culture    - Bactrim this time (last culture was pan-sensitive)    - Urology consult if recurs again  A total of 15 minutes were  spent face-to-face with the patient during this encounter and over half of that time dealt with counseling and coordination of care.  Barnett Applebaum, MD, [facility], [facility] 03/05/2018  11:52 AM

## 2018-03-05 NOTE — Patient Instructions (Signed)
Ovarian Cyst An ovarian cyst is a fluid-filled sac that forms on an ovary. The ovaries are small organs that produce eggs in women. Various types of cysts can form on the ovaries. Some may cause symptoms and require treatment. Most ovarian cysts go away on their own, are not cancerous (are benign), and do not cause problems. Common types of ovarian cysts include:  Functional (follicle) cysts. ? Occur during the menstrual cycle, and usually go away with the next menstrual cycle if you do not get pregnant. ? Usually cause no symptoms.  Endometriomas. ? Are cysts that form from the tissue that lines the uterus (endometrium). ? Are sometimes called "chocolate cysts" because they become filled with blood that turns Hausman. ? Can cause pain in the lower abdomen during intercourse and during your period.  Cystadenoma cysts. ? Develop from cells on the outside surface of the ovary. ? Can get very large and cause lower abdomen pain and pain with intercourse. ? Can cause severe pain if they twist or break open (rupture).  Dermoid cysts. ? Are sometimes found in both ovaries. ? May contain different kinds of body tissue, such as skin, teeth, hair, or cartilage. ? Usually do not cause symptoms unless they get very big.  Theca lutein cysts. ? Occur when too much of a certain hormone (human chorionic gonadotropin) is produced and overstimulates the ovaries to produce an egg. ? Are most common after having procedures used to assist with the conception of a baby (in vitro fertilization).  What are the causes? Ovarian cysts may be caused by:  Ovarian hyperstimulation syndrome. This is a condition that can develop from taking fertility medicines. It causes multiple large ovarian cysts to form.  Polycystic ovarian syndrome (PCOS). This is a common hormonal disorder that can cause ovarian cysts, as well as problems with your period or fertility.  What increases the risk? The following factors may make  you more likely to develop ovarian cysts:  Being overweight or obese.  Taking fertility medicines.  Taking certain forms of hormonal birth control.  Smoking.  What are the signs or symptoms? Many ovarian cysts do not cause symptoms. If symptoms are present, they may include:  Pelvic pain or pressure.  Pain in the lower abdomen.  Pain during sex.  Abdominal swelling.  Abnormal menstrual periods.  Increasing pain with menstrual periods.  How is this diagnosed? These cysts are commonly found during a routine pelvic exam. You may have tests to find out more about the cyst, such as:  Ultrasound.  X-ray of the pelvis.  CT scan.  MRI.  Blood tests.  How is this treated? Many ovarian cysts go away on their own without treatment. Your health care provider may want to check your cyst regularly for 2-3 months to see if it changes. If you are in menopause, it is especially important to have your cyst monitored closely because menopausal women have a higher rate of ovarian cancer. When treatment is needed, it may include:  Medicines to help relieve pain.  A procedure to drain the cyst (aspiration).  Surgery to remove the whole cyst.  Hormone treatment or birth control pills. These methods are sometimes used to help dissolve a cyst.  Follow these instructions at home:  Take over-the-counter and prescription medicines only as told by your health care provider.  Do not drive or use heavy machinery while taking prescription pain medicine.  Get regular pelvic exams and Pap tests as often as told by your health care   provider.  Return to your normal activities as told by your health care provider. Ask your health care provider what activities are safe for you.  Do not use any products that contain nicotine or tobacco, such as cigarettes and e-cigarettes. If you need help quitting, ask your health care provider.  Keep all follow-up visits as told by your health care provider.  This is important. Contact a health care provider if:  Your periods are late, irregular, or painful, or they stop.  You have pelvic pain that does not go away.  You have pressure on your bladder or trouble emptying your bladder completely.  You have pain during sex.  You have any of the following in your abdomen: ? A feeling of fullness. ? Pressure. ? Discomfort. ? Pain that does not go away. ? Swelling.  You feel generally ill.  You become constipated.  You lose your appetite.  You develop severe acne.  You start to have more body hair and facial hair.  You are gaining weight or losing weight without changing your exercise and eating habits.  You think you may be pregnant. Get help right away if:  You have abdominal pain that is severe or gets worse.  You cannot eat or drink without vomiting.  You suddenly develop a fever.  Your menstrual period is much heavier than usual. This information is not intended to replace advice given to you by your health care provider. Make sure you discuss any questions you have with your health care provider. Document Released: 07/09/2005 Document Revised: 01/27/2016 Document Reviewed: 12/11/2015 Elsevier Interactive Patient Education  2018 [employer]  

## 2018-03-07 ENCOUNTER — Other Ambulatory Visit: Payer: Self-pay | Admitting: Obstetrics & Gynecology

## 2018-03-07 LAB — URINE CULTURE

## 2018-03-07 MED ORDER — PENICILLIN V POTASSIUM 500 MG PO TABS: 500.0000 mg | ORAL_TABLET | Freq: Four times a day (QID) | ORAL | 0 refills | Status: DC

## 2018-03-07 NOTE — Progress Notes (Signed)
Let her know Urine Culture positive for Strep so need to change antibiotic to Penicillin, eRx done for 7 day course

## 2018-03-07 NOTE — Progress Notes (Signed)
Pt aware.

## 2018-03-28 ENCOUNTER — Inpatient Hospital Stay: Payer: 59 | Admitting: Oncology

## 2018-03-28 ENCOUNTER — Inpatient Hospital Stay: Payer: 59

## 2018-04-10 ENCOUNTER — Inpatient Hospital Stay: Payer: 59 | Admitting: Oncology

## 2018-04-10 ENCOUNTER — Inpatient Hospital Stay: Payer: 59

## 2018-04-22 DIAGNOSIS — N390 Urinary tract infection, site not specified: Secondary | ICD-10-CM | POA: Diagnosis not present

## 2018-04-27 ENCOUNTER — Encounter: Payer: Self-pay | Admitting: Gynecology

## 2018-04-27 ENCOUNTER — Ambulatory Visit
Admission: EM | Admit: 2018-04-27 | Discharge: 2018-04-27 | Disposition: A | Discharge status: Home / Self Care | Payer: 59 | Attending: Family Medicine | Admitting: Family Medicine

## 2018-04-27 DIAGNOSIS — H1131 Conjunctival hemorrhage, right eye: Secondary | ICD-10-CM

## 2018-04-27 DIAGNOSIS — H1132 Conjunctival hemorrhage, left eye: Secondary | ICD-10-CM

## 2018-04-27 NOTE — Discharge Instructions (Addendum)
At this time, you do not have pink eye or any other infectious disorder. Recommend continue to monitor your eye. May use your eye drops as needed for comfort. Recommend do not wear contacts for the next 5 days. Follow-up with your eye doctor if any change in vision, discharge or pain occurs.

## 2018-04-27 NOTE — ED Provider Notes (Signed)
[facility]    CSN: [id number] Arrival date & time: 04/27/18  0930     History   Chief Complaint Chief Complaint  Patient presents with  . Eye Problem    HPI Marie Vaughan is a 47 y.o. female.   47 year old female presents with left eye irritation and redness. She thought she might have had a hair in her left eye yesterday afternoon and rubbed her eye while her contacts were in. She tried to flush out any foreign substance with eye drops. She took her contacts out and later that evening she noticed her left eye looked bloody on the surface toward her tear duct. She denies any itching, tearing or discharge. She woke up this morning with a slight increase in the redness in her left eye but only near her tear duct and not on the lateral side of her iris. Her eye still feels a little irritated but no pain or discharge. She is wearing glasses now and no change in vision. She denies any fever, nasal congestion or cough. No recent constipation or straining. Does admit to laughing extensively last night and uncertain if that caused any strain. No previous history of eye problems. Other chronic health issues include migraine headaches, GERD and seasonal allergies and currently on Topamax, aspirin, Pepcid, Xyzal and vit B12 daily and Albuterol and Imitrex prn.   The history is provided by the patient.    Past Medical History:  Diagnosis Date  . Acne   . Acute bronchitis   . Angio-edema   . Anxiety   . Anxiety state, unspecified   . Cervicalgia   . Contact lens/glasses fitting    wears contacts or glasses  . Dysmenorrhea   . GERD (gastroesophageal reflux disease)   . History of cardiac cath    a. 04/29/2014: LM nl, LAD no dz, D1 no dz, D2 very small vessel, D3 very small vessel, LCx nl, OM1 very small vessel, OM2 medium sized vessel, OM 3 no dz, RCA no dz, PDA no dz   . History of stress test    a. Lexiscan 08/25/13: no sig ischemia, attenuation artifact noted, no sig WMA noted,  EF 69%, no EKG changes concerning for ischemia  . HTN (hypertension)   . Iron deficiency anemia secondary to blood loss (chronic)   . Migraine with aura, without mention of intractable migraine without mention of status migrainosus    "vestibular" per pt  . Obesity   . 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 Pain in thoracic spine   . Pharyngitis   . Tear of medial meniscus of left knee   . Tear of medial meniscus of right knee   . Vaginal delivery    x 3  . Wears contact lenses     Patient Active Problem List   Diagnosis Date Noted  . History of ovarian cyst 03/05/2018  . Left ovarian cyst 03/05/2018  . Gross hematuria 01/31/2018  . Vaginal bleeding 01/31/2018  . History of hysterectomy 01/31/2018  . Colon cancer screening   . Polyp of sigmoid colon   . Benign neoplasm of transverse colon   . Iron deficiency anemia   . Metabolic syndrome 33/29/5188  . OSA on CPAP 02/03/2016  . 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 Abnormal brain MRI 12/20/2015  . Dizziness 12/13/2015  . Near syncope 12/10/2015  . Atypical chest pain 08/22/2013  . Essential hypertension 08/22/2013  . Angio-edema   . GERD (gastroesophageal reflux disease)   .  Migraine with aura and without status migrainosus   . Acne   . Anxiety   . Allergic rhinitis 10/05/2011  . Insomnia 08/30/2011  . Obesity 08/30/2011    Past Surgical History:  Procedure Laterality Date  . ABDOMINAL HYSTERECTOMY  04-25-10   Due to abnormal PAP  . CARDIAC CATHETERIZATION  04/2014   [facility]  . COLONOSCOPY WITH PROPOFOL N/A 07/11/2017   Procedure: COLONOSCOPY WITH PROPOFOL;  Surgeon: [name], MD;  Location: [place];  Service: Endoscopy;  Laterality: N/A;  . DILATION AND CURETTAGE OF UTERUS    . ESOPHAGOGASTRODUODENOSCOPY (EGD) WITH PROPOFOL N/A 07/11/2017   Procedure: ESOPHAGOGASTRODUODENOSCOPY (EGD) WITH PROPOFOL;  Surgeon: [name], MD;  Location: [place];  Service: Endoscopy;  Laterality:  N/A;  diabetic - diet controlled  . GIVENS CAPSULE STUDY N/A 09/23/2017   Procedure: GIVENS CAPSULE STUDY;  Surgeon: Virgel Manifold, MD;  Location: [facility];  Service: Endoscopy;  Laterality: N/A;  . KNEE ARTHROSCOPY Left 06/10/2013   Procedure: LEFT KNEE ARTHROSCOPY KNEE WITH PARTIAL MEDIAL MENISCECTOMY ;  Surgeon: Lorn Junes, MD;  Location: [place];  Service: Orthopedics;  Laterality: Left;  xerofoam, 4x4's, webril, ace wrap, ice wrap  . KNEE ARTHROSCOPY WITH MEDIAL MENISECTOMY Right 06/10/2013   Procedure: RIGHT KNEE ARTHROSCOPY WITH MEDIAL MENISECTOMY;  Surgeon: Lorn Junes, MD;  Location: [place];  Service: Orthopedics;  Laterality: Right;  partial lateral menisectomoy and chondroplasty  . OVARIAN CYST REMOVAL  04-25-10    OB History    Gravida  4   Para  3   Term  3   Preterm  0   AB  1   Living  3     SAB      TAB      Ectopic      Multiple      Live Births               Home Medications    Prior to Admission medications   Medication Sig Start Date End Date Taking? Authorizing Provider  Albuterol Sulfate (PROAIR RESPICLICK) 749 (90 Base) MCG/ACT AEPB Inhale 2 puffs into the lungs every 4 (four) hours as needed (Shortness of breath or wheezing). Use with a spacer 11/19/17  Yes Lorin Picket, PA-C  aspirin EC 81 MG tablet Take 1 tablet (81 mg total) by mouth daily. 04/21/14  Yes Dunn, Ryan M, PA-C  Cyanocobalamin (B-12) 1000 MCG SUBL Place 1 tablet under the tongue daily. 08/01/17  Yes Sowles, Drue Stager, MD  famotidine (PEPCID) 20 MG tablet Take 1 tablet (20 mg total) by mouth 2 (two) times daily. 06/17/17 06/17/18 Yes Darel Hong, MD  levocetirizine (XYZAL) 5 MG tablet Take 1 tablet (5 mg total) by mouth every evening. 10/31/17  Yes Scot Jun, FNP  SUMAtriptan (IMITREX) 50 MG tablet Take by mouth daily as needed for migraine.  10/10/16  Yes [provider]  topiramate (TOPAMAX) 100 MG  tablet Take by mouth daily as needed.  10/10/16  Yes [provider]    Family History Family History  Problem Relation Age of Onset  . Early death Mother        Murdered when pt was 72 years old  . Diabetes Mother   . Hypertension Mother   . Gout Mother   . Kidney failure Mother   . Early death Sister 59       due to hemorage  . Alcohol abuse Maternal Uncle   .  Alcohol abuse Other   . Arthritis Other   . Heart disease Other   . Other Other        cousin- phlebitis  . Diabetes Maternal Aunt   . Hyperlipidemia Maternal Aunt   . Hypertension Maternal Aunt   . Prostate cancer Maternal Uncle   . Breast cancer Neg Hx     Social History Social History   Tobacco Use  . Smoking status: Never Smoker  . Smokeless tobacco: Never Used  Substance Use Topics  . Alcohol use: No    Alcohol/week: 0.0 standard drinks  . Drug use: No     Allergies   Iodinated diagnostic agents; Chocolate; Chocolate flavor; Iohexol; Peanut-containing drug products; and Pineapple   Review of Systems Review of Systems  Constitutional: Negative for activity change, appetite change, chills, fatigue and fever.  HENT: Negative for congestion, ear discharge, ear pain, facial swelling, mouth sores, postnasal drip, rhinorrhea, sinus pressure, sinus pain, sneezing, sore throat and trouble swallowing.   Eyes: Positive for redness (left eye only). Negative for photophobia, pain, discharge, itching and visual disturbance.  Respiratory: Negative for cough, chest tightness, shortness of breath and wheezing.   Gastrointestinal: Negative for diarrhea, nausea and vomiting.  Musculoskeletal: Negative for arthralgias, myalgias, neck pain and neck stiffness.  Skin: Negative for color change, rash and wound.  Allergic/Immunologic: Positive for environmental allergies.  Neurological: Negative for dizziness, tremors, seizures, syncope, weakness, light-headedness, numbness and headaches (no current headache).    Hematological: Negative for adenopathy. Does not bruise/bleed easily.     Physical Exam Triage Vital Signs ED Triage Vitals  Enc Vitals Group     BP 04/27/18 0948 134/89     Pulse Rate 04/27/18 0948 88     Resp 04/27/18 0948 16     Temp 04/27/18 0948 99 F (37.2 C)     Temp Source 04/27/18 0948 Oral     SpO2 04/27/18 0948 99 %     Weight 04/27/18 0951 219 lb (99.3 kg)     Height 04/27/18 0951 5\' 2"  (1.575 m)     Head Circumference --      Peak Flow --      Pain Score 04/27/18 0946 0     Pain Loc --      Pain Edu? --      Excl. in [place]? --    No data found.  Updated Vital Signs BP 134/89 (BP Location: Left Arm)   Pulse 88   Temp 99 F (37.2 C) (Oral)   Resp 16   Ht 5\' 2"  (1.575 m)   Wt 219 lb (99.3 kg)   SpO2 99%   BMI 40.06 kg/m   Visual Acuity Right Eye Distance: 20/20 Left Eye Distance: 20/20 Bilateral Distance: 20/20(with corrective lens)  Right Eye Near:   Left Eye Near:    Bilateral Near:     Physical Exam  Constitutional: She is oriented to person, place, and time. She appears well-developed and well-nourished. She is cooperative. She does not appear ill. No distress.  Patient sitting comfortably on exam table in no acute distress.   HENT:  Head: Normocephalic and atraumatic.  Right Ear: Hearing and external ear normal.  Left Ear: Hearing and external ear normal.  Nose: Nose normal. Right sinus exhibits no maxillary sinus tenderness and no frontal sinus tenderness. Left sinus exhibits no maxillary sinus tenderness and no frontal sinus tenderness.  Mouth/Throat: Uvula is midline, oropharynx is clear and moist and mucous membranes are normal.  Eyes: Pupils  are equal, round, and reactive to light. EOM and lids are normal. Lids are everted and swept, no foreign bodies found. Right eye exhibits no chemosis, no discharge, no exudate and no hordeolum. No foreign body present in the right eye. Left eye exhibits no chemosis, no discharge, no exudate and no  hordeolum. No foreign body present in the left eye. Right conjunctiva is not injected. Right conjunctiva has no hemorrhage. Left conjunctiva is not injected. Left conjunctiva has a hemorrhage.  Fundoscopic exam:      The right eye shows no AV nicking, no exudate and no papilledema. The right eye shows red reflex.       The left eye shows no AV nicking, no exudate and no papilledema. The left eye shows red reflex.  Slit lamp exam:      The right eye shows no corneal flare, no corneal ulcer and no foreign body.       The left eye shows no corneal flare, no corneal ulcer and no foreign body.    Subconjunctival hemorrhage present between 9 and 7 o'clock on left eye. Does not enter iris border. No other conjunctival redness. No discharge or tearing. No foreign bodies seen.  Eyelids- no swelling, redness or tenderness.   Neck: Normal range of motion. Neck supple.  Cardiovascular: Normal rate.  Pulmonary/Chest: Effort normal.  Musculoskeletal: Normal range of motion.  Lymphadenopathy:    She has no cervical adenopathy.  Neurological: She is alert and oriented to person, place, and time.  Skin: Skin is warm and dry. No rash noted.  Psychiatric: She has a normal mood and affect. Her behavior is normal. Judgment and thought content normal.  Vitals reviewed.    UC Treatments / Results  Labs (all labs ordered are listed, but only abnormal results are displayed) Labs Reviewed - No data to display  EKG None  Radiology No results found.  Procedures Procedures (including critical care time)  Medications Ordered in UC Medications - No data to display  Initial Impression / Assessment and Plan / UC Course  I have reviewed the triage vital signs and the nursing notes.  Pertinent labs & imaging results that were available during my care of the patient were reviewed by me and considered in my medical decision making (see chart for details).    Dr. Zenda Alpers also examined patient. Agreed that  patient probably has a subconjunctival hemorrhage of her left eye. Reviewed with patient that this is benign and not infectious. Discussed that condition and blood will reabsorb on own. Continue to monitor eye and symptoms. May use your eye drops prescribed by your eye doctor as needed for comfort. Avoid rubbing your eye and do not wear your contacts for at least 5 days. Recommend follow-up with your eye doctor if any change in vision, discharge or pain occurs.  Final Clinical Impressions(s) / UC Diagnoses   Final diagnoses:  Subconjunctival hemorrhage of left eye     Discharge Instructions     At this time, you do not have pink eye or any other infectious disorder. Recommend continue to monitor your eye. May use your eye drops as needed for comfort. Recommend do not wear contacts for the next 5 days. Follow-up with your eye doctor if any change in vision, discharge or pain occurs.     ED Prescriptions    None     Controlled Substance Prescriptions [place] Controlled Substance Registry consulted? Not Applicable   Katy Apo, NP 04/27/18 [phone]

## 2018-04-27 NOTE — ED Triage Notes (Signed)
Patient with left eye redness.

## 2018-05-05 ENCOUNTER — Other Ambulatory Visit: Payer: 59

## 2018-05-05 ENCOUNTER — Ambulatory Visit: Payer: 59 | Admitting: Obstetrics & Gynecology

## 2018-05-05 ENCOUNTER — Other Ambulatory Visit: Payer: Self-pay | Admitting: Obstetrics & Gynecology

## 2018-05-05 DIAGNOSIS — N83202 Unspecified ovarian cyst, left side: Secondary | ICD-10-CM

## 2018-07-07 ENCOUNTER — Encounter: Payer: Self-pay | Admitting: Family Medicine

## 2018-07-07 ENCOUNTER — Ambulatory Visit (INDEPENDENT_AMBULATORY_CARE_PROVIDER_SITE_OTHER): Payer: 59 | Admitting: Family Medicine

## 2018-07-07 VITALS — BP 150/90 | HR 90 | Temp 98.5°F | Resp 16 | Ht 62.0 in | Wt 218.2 lb

## 2018-07-07 DIAGNOSIS — R059 Cough, unspecified: Secondary | ICD-10-CM

## 2018-07-07 DIAGNOSIS — J069 Acute upper respiratory infection, unspecified: Secondary | ICD-10-CM

## 2018-07-07 DIAGNOSIS — R05 Cough: Secondary | ICD-10-CM | POA: Diagnosis not present

## 2018-07-07 MED ORDER — HYDROCHLOROTHIAZIDE 12.5 MG PO TABS: 12.5000 mg | ORAL_TABLET | Freq: Every day | ORAL | 0 refills | Status: DC

## 2018-07-07 MED ORDER — PREDNISONE 10 MG PO TABS: 10.0000 mg | ORAL_TABLET | Freq: Two times a day (BID) | ORAL | 0 refills | Status: DC

## 2018-07-07 MED ORDER — HYDROCOD POLST-CPM POLST ER 10-8 MG/5ML PO SUER: 5.0000 mL | Freq: Two times a day (BID) | ORAL | 0 refills | Status: DC | PRN

## 2018-07-07 NOTE — Progress Notes (Signed)
Name: Marie Vaughan   MRN: [id number]    DOB: Mar 06, 1971   Date:07/07/2018       Progress Note  Subjective  Chief Complaint  Chief Complaint  Patient presents with  . Headache  . Cough    HPI  URI: she developed dry cough, headache, hoarseness, and sometimes clear sputum. Symptoms started 2 days ago. Today headache is not as intense, but feels likes there is post-nasal drainage. She works full time and needs to feel better soon. No fever or chills. Appetite is mildly decreased. No rashes.   HTN: bp elevated today and also during a blood drive, she used to take medication for HTN, but bp was under control and she stopped taking it. She has a long history of intermittent chest pain, seen by cardiologist in the past and had negative evaluation. SOB is also chronic and unchanged   Insomnia: she is missing her son that is in the [employer], went to boot camp two weeks ago    Patient Active Problem List   Diagnosis Date Noted  . History of ovarian cyst 03/05/2018  . Left ovarian cyst 03/05/2018  . Gross hematuria 01/31/2018  . Vaginal bleeding 01/31/2018  . History of hysterectomy 01/31/2018  . Colon cancer screening   . Polyp of sigmoid colon   . Benign neoplasm of transverse colon   . Iron deficiency anemia   . Metabolic syndrome 32/44/0102  . OSA on CPAP 02/03/2016  . 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 Abnormal brain MRI 12/20/2015  . Dizziness 12/13/2015  . Near syncope 12/10/2015  . Atypical chest pain 08/22/2013  . Essential hypertension 08/22/2013  . Angio-edema   . GERD (gastroesophageal reflux disease)   . Migraine with aura and without status migrainosus   . Acne   . Allergic rhinitis 10/05/2011  . Insomnia 08/30/2011  . Obesity 08/30/2011    Past Surgical History:  Procedure Laterality Date  . ABDOMINAL HYSTERECTOMY  04-25-10   Due to abnormal PAP  . CARDIAC CATHETERIZATION  04/2014   [facility]  . COLONOSCOPY WITH PROPOFOL N/A 07/11/2017   Procedure: COLONOSCOPY WITH  PROPOFOL;  Surgeon: [name], MD;  Location: [place];  Service: Endoscopy;  Laterality: N/A;  . DILATION AND CURETTAGE OF UTERUS    . ESOPHAGOGASTRODUODENOSCOPY (EGD) WITH PROPOFOL N/A 07/11/2017   Procedure: ESOPHAGOGASTRODUODENOSCOPY (EGD) WITH PROPOFOL;  Surgeon: [name], MD;  Location: [place];  Service: Endoscopy;  Laterality: N/A;  diabetic - diet controlled  . GIVENS CAPSULE STUDY N/A 09/23/2017   Procedure: GIVENS CAPSULE STUDY;  Surgeon: Virgel Manifold, MD;  Location: [facility];  Service: Endoscopy;  Laterality: N/A;  . KNEE ARTHROSCOPY Left 06/10/2013   Procedure: LEFT KNEE ARTHROSCOPY KNEE WITH PARTIAL MEDIAL MENISCECTOMY ;  Surgeon: Lorn Junes, MD;  Location: [place];  Service: Orthopedics;  Laterality: Left;  xerofoam, 4x4's, webril, ace wrap, ice wrap  . KNEE ARTHROSCOPY WITH MEDIAL MENISECTOMY Right 06/10/2013   Procedure: RIGHT KNEE ARTHROSCOPY WITH MEDIAL MENISECTOMY;  Surgeon: Lorn Junes, MD;  Location: [place];  Service: Orthopedics;  Laterality: Right;  partial lateral menisectomoy and chondroplasty  . OVARIAN CYST REMOVAL  04-25-10    Family History  Problem Relation Age of Onset  . Early death Mother        Murdered when pt was 46 years old  . Diabetes Mother   . Hypertension Mother   . Gout Mother   . Kidney failure Mother   . Early  death Sister 15       due to hemorage  . Alcohol abuse Maternal Uncle   . Alcohol abuse Other   . Arthritis Other   . Heart disease Other   . Other Other        cousin- phlebitis  . Diabetes Maternal Aunt   . Hyperlipidemia Maternal Aunt   . Hypertension Maternal Aunt   . Prostate cancer Maternal Uncle   . Breast cancer Neg Hx     Social History   Socioeconomic History  . Marital status: Married    Spouse name: Not on file  . Number of children: 3  . Years of education: Not on file  . Highest education level: Not on file  Occupational  History  . Occupation: Proofreader: [employer]  . Financial resource strain: Not very hard  . Food insecurity:    Worry: Never true    Inability: Never true  . Transportation needs:    Medical: No    Non-medical: No  Tobacco Use  . Smoking status: Never Smoker  . Smokeless tobacco: Never Used  Substance and Sexual Activity  . Alcohol use: No    Alcohol/week: 0.0 standard drinks  . Drug use: No  . Sexual activity: Yes    Partners: Male    Birth control/protection: Surgical  Lifestyle  . Physical activity:    Days per week: 3 days    Minutes per session: 60 min  . Stress: Very much  Relationships  . Social connections:    Talks on phone: More than three times a week    Gets together: More than three times a week    Attends religious service: More than 4 times per year    Active member of club or organization: Yes    Attends meetings of clubs or organizations: More than 4 times per year    Relationship status: Married  . Intimate partner violence:    Fear of current or ex partner: No    Emotionally abused: No    Physically abused: No    Forced sexual activity: No  Other Topics Concern  . Not on file  Social History Narrative   Lives in [place] with husband, two children gone . Daughter's are grown and out of the house. Her 48 yo is going to college              Current Outpatient Medications:  .  aspirin EC 81 MG tablet, Take 1 tablet (81 mg total) by mouth daily., Disp: 90 tablet, Rfl: 3 .  Cyanocobalamin (B-12) 1000 MCG SUBL, Place 1 tablet under the tongue daily., Disp: 90 each, Rfl: 1 .  Albuterol Sulfate (PROAIR RESPICLICK) 789 (90 Base) MCG/ACT AEPB, Inhale 2 puffs into the lungs every 4 (four) hours as needed (Shortness of breath or wheezing). Use with a spacer (Patient not taking: Reported on 07/07/2018), Disp: 1 each, Rfl: 0 .  famotidine (PEPCID) 20 MG tablet, Take 1 tablet (20 mg total) by mouth 2 (two) times daily.,  Disp: 60 tablet, Rfl: 0 .  levocetirizine (XYZAL) 5 MG tablet, Take 1 tablet (5 mg total) by mouth every evening. (Patient not taking: Reported on 07/07/2018), Disp: 30 tablet, Rfl: 2 .  SUMAtriptan (IMITREX) 50 MG tablet, Take by mouth daily as needed for migraine. , Disp: , Rfl:  .  topiramate (TOPAMAX) 100 MG tablet, Take by mouth daily as needed. , Disp: , Rfl:   Allergies  Allergen Reactions  . Iodinated Diagnostic Agents Shortness Of Breath  . Chocolate     Hives, itching  . Chocolate Flavor Other (See Comments)    Hives, itching  . Iohexol     CP and SOB  . Peanut-Containing Drug Products Swelling    cashews  . Pineapple     I personally reviewed active problem list, medication list, allergies, family history, social history with the patient/caregiver today.   ROS  Constitutional: Negative for fever, positive for mild  weight change.  Respiratory: Positive  for cough, but unchanged for shortness of breath.   Cardiovascular: Negative for chest pain or palpitations.  Gastrointestinal: Negative for abdominal pain, no bowel changes.  Musculoskeletal: Negative for gait problem or joint swelling.  Skin: Negative for rash.  Neurological: Negative for dizziness or headache.  No other specific complaints in a complete review of systems (except as listed in HPI above).  Objective  Vitals:   07/07/18 1357 07/07/18 1359  BP: (!) 150/100 (!) 150/90  Pulse: 90   Resp: 16   Temp: 98.5 F (36.9 C)   TempSrc: Oral   SpO2: 98%   Weight: 218 lb 3.2 oz (99 kg)   Height: 5\' 2"  (1.575 m)     Body mass index is 39.91 kg/m.  Physical Exam  Constitutional: Patient appears well-developed and well-nourished. Obese  No distress.  HEENT: head atraumatic, normocephalic, pupils equal and reactive to light,  neck supple, throat within normal limits Cardiovascular: Normal rate, regular rhythm and normal heart sounds.  No murmur heard. No BLE edema. Pulmonary/Chest: Effort normal and  breath sounds normal. No respiratory distress. Abdominal: Soft.  There is no tenderness. Psychiatric: Patient has a normal mood and affect. behavior is normal. Judgment and thought content normal.  PHQ2/9: Depression screen [facility] 2/9 02/11/2017 03/05/2016 12/05/2015 04/20/2015  Decreased Interest 0 0 0 0  Down, Depressed, Hopeless 0 0 0 0  PHQ - 2 Score 0 0 0 0     Fall Risk: Fall Risk  07/07/2018 10/09/2017 02/11/2017 03/05/2016 12/05/2015  Falls in the past year? 0 Yes Yes No No  Number falls in past yr: - 2 or more 2 or more - -  Injury with Fall? - No No - -  Risk Factor Category  - - High Fall Risk - -  Risk for fall due to : - Other (Comment) - - -  Risk for fall due to: Comment - anemia/headaches- being followed by Dr. Teresa Pelton. - - -  Follow up - Education provided;Falls prevention discussed - - -      Assessment & Plan  1. Cough  - chlorpheniramine-HYDROcodone (TUSSIONEX PENNKINETIC ER) 10-8 MG/5ML SUER; Take 5 mLs by mouth every 12 (twelve) hours as needed.  Dispense: 140 mL; Refill: 0  2. Viral upper respiratory tract infection  - hydrochlorothiazide (HYDRODIURIL) 12.5 MG tablet; Take 1 tablet (12.5 mg total) by mouth daily.  Dispense: 90 tablet; Refill: 0 - predniSONE (DELTASONE) 10 MG tablet; Take 1 tablet (10 mg total) by mouth 2 (two) times daily with a meal.  Dispense: 6 tablet; Refill: 0

## 2018-08-07 ENCOUNTER — Ambulatory Visit: Payer: 59 | Admitting: Family Medicine

## 2018-08-08 ENCOUNTER — Ambulatory Visit: Payer: 59 | Admitting: Family Medicine

## 2018-08-08 ENCOUNTER — Telehealth: Payer: Self-pay

## 2018-08-08 NOTE — Telephone Encounter (Signed)
Copied from [place] [id number]. Topic: Quick Communication - Other Results (Clinic Use ONLY) >> Aug 08, 2018  9:40 AM Karene Fry P wrote: Pt has appt today a 2p. She had a tooth pulled on yesterday and is still on antibotic along with taking pain meds. Should the patient cancel the appt or still come in? She states that she fill her BP is going to be high due to her pain meds and pain level. Please advise

## 2018-08-08 NOTE — Telephone Encounter (Signed)
She needs to come in

## 2018-08-08 NOTE — Telephone Encounter (Signed)
Called patient to tell her to come in to be seen. She states that she has been taking pain medication. She continues to take her blood pressure medication as prescribed with no missed doses. She had the nurse take her blood pressure while she was on the phone with me and it was 160/92 with heart rate of 101. She denies having any chest pain, palpitations, visual disturbances or sob. She was dropped off at work because she took the medication this morning. She thinks that coming in to see you with her pain level being so high that we want be able to get an accurate blood pressure reading. She plans to re-schedule after her pain level decreases.

## 2018-12-30 DIAGNOSIS — H5213 Myopia, bilateral: Secondary | ICD-10-CM | POA: Diagnosis not present

## 2019-01-09 ENCOUNTER — Other Ambulatory Visit: Payer: Self-pay

## 2019-01-09 ENCOUNTER — Encounter: Payer: Self-pay | Admitting: Family Medicine

## 2019-01-09 ENCOUNTER — Ambulatory Visit (INDEPENDENT_AMBULATORY_CARE_PROVIDER_SITE_OTHER): Payer: 59 | Admitting: Family Medicine

## 2019-01-09 VITALS — BP 137/94 | HR 86 | Ht 62.0 in | Wt 216.8 lb

## 2019-01-09 DIAGNOSIS — R739 Hyperglycemia, unspecified: Secondary | ICD-10-CM | POA: Diagnosis not present

## 2019-01-09 DIAGNOSIS — E538 Deficiency of other specified B group vitamins: Secondary | ICD-10-CM | POA: Diagnosis not present

## 2019-01-09 DIAGNOSIS — E785 Hyperlipidemia, unspecified: Secondary | ICD-10-CM

## 2019-01-09 DIAGNOSIS — G4733 Obstructive sleep apnea (adult) (pediatric): Secondary | ICD-10-CM | POA: Diagnosis not present

## 2019-01-09 DIAGNOSIS — G8929 Other chronic pain: Secondary | ICD-10-CM

## 2019-01-09 DIAGNOSIS — G43109 Migraine with aura, not intractable, without status migrainosus: Secondary | ICD-10-CM | POA: Diagnosis not present

## 2019-01-09 DIAGNOSIS — E8881 Metabolic syndrome: Secondary | ICD-10-CM

## 2019-01-09 DIAGNOSIS — Z862 Personal history of diseases of the blood and blood-forming organs and certain disorders involving the immune mechanism: Secondary | ICD-10-CM

## 2019-01-09 DIAGNOSIS — I10 Essential (primary) hypertension: Secondary | ICD-10-CM | POA: Diagnosis not present

## 2019-01-09 DIAGNOSIS — Z9989 Dependence on other enabling machines and devices: Secondary | ICD-10-CM

## 2019-01-09 DIAGNOSIS — M25562 Pain in left knee: Secondary | ICD-10-CM

## 2019-01-09 DIAGNOSIS — G43809 Other migraine, not intractable, without status migrainosus: Secondary | ICD-10-CM

## 2019-01-09 DIAGNOSIS — J069 Acute upper respiratory infection, unspecified: Secondary | ICD-10-CM

## 2019-01-09 MED ORDER — SUMATRIPTAN SUCCINATE 50 MG PO TABS: 50.0000 mg | ORAL_TABLET | Freq: Every day | ORAL | 2 refills | Status: DC | PRN

## 2019-01-09 MED ORDER — TOPIRAMATE 100 MG PO TABS: 100.0000 mg | ORAL_TABLET | Freq: Every day | ORAL | 0 refills | Status: DC | PRN

## 2019-01-09 MED ORDER — HYDROCHLOROTHIAZIDE 12.5 MG PO TABS: 12.5000 mg | ORAL_TABLET | Freq: Every day | ORAL | 0 refills | Status: DC

## 2019-01-09 NOTE — Progress Notes (Signed)
Name: Marie Vaughan   MRN: [id number]    DOB: Jul 28, 1970   Date:01/09/2019       Progress Note  Subjective  Chief Complaint  Chief Complaint  Patient presents with  . Medication Refill  . Hypertension    Headaches, dizziness and edema in feet and ankles, excessive sweating   . Migraine    Has them occasionally   . Sleep Apnea    Still wears her CPAP  . Insomnia  . Obesity    I connected with  [name]  on 01/09/19 at  1:40 PM EDT by a video enabled telemedicine application and verified that I am speaking with the correct person using two identifiers.  I discussed the limitations of evaluation and management by telemedicine and the availability of in person appointments. The patient expressed understanding and agreed to proceed. Staff also discussed with the patient that there may be a patient responsible charge related to this service. Patient Location: at home Provider Location: [facility]    HPI  HTN: she is currently not taking medications, she used to take HCTZ and bp is elevated today. She denieschest pain, or palpitation, however she has noticed some headache, dizziness and lower extremity edema lately. She has gone to health at work a few months ago because her bp was elevated in the 150's. It improved with medication, but she has been out of medication for two weeks now  Migraine/vestibular: She has not been taking Topamax, , she lost to follow up with Dr. Manuella Ghazi. Given Imitrex but gets very groggy on it. She continues to have episodes of dizziness.   Paresthesia: on tips of her fingers, we will recheck B12 level  OSA: moderate to severe based on sleep study, started on CPAP at 9 cm H2O July 2017, she states she has not been compliant with CPAP machine lately, wearing her mask at most 1-2 times a week   History of iron deficiency anemia: history of iron infusion, we will recheck level  Insomnia: she states usually sleeps well, no longer taking medication    Metabolic syndrome: she denies polyphagia, but has polydipsia, she has episodes of polyuria ( usually when taking HCTZ) last HDL 46, history of HTN and increase in abdominal girth. She is due for repeat labs   Obesity:  She states her weight has been stable. She took medication in the past and helped her lose but unable to take phentermine because of hypertension. Discussed other medications but afraid of cost.   Left knee pain: she would like to go back to see Dr. Noemi Chapel, having difficulty walking, knee instability that has caused her to fall before.   Patient Active Problem List   Diagnosis Date Noted  . History of ovarian cyst 03/05/2018  . Left ovarian cyst 03/05/2018  . Gross hematuria 01/31/2018  . Vaginal bleeding 01/31/2018  . History of hysterectomy 01/31/2018  . Colon cancer screening   . Polyp of sigmoid colon   . Benign neoplasm of transverse colon   . Iron deficiency anemia   . Metabolic syndrome 24/58/0998  . OSA on CPAP 02/03/2016  . 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 Abnormal brain MRI 12/20/2015  . Dizziness 12/13/2015  . Near syncope 12/10/2015  . Atypical chest pain 08/22/2013  . Essential hypertension 08/22/2013  . Angio-edema   . GERD (gastroesophageal reflux disease)   . Migraine with aura and without status migrainosus   . Acne   . Allergic rhinitis 10/05/2011  . Insomnia 08/30/2011  .  Obesity 08/30/2011    Past Surgical History:  Procedure Laterality Date  . ABDOMINAL HYSTERECTOMY  04-25-10   Due to abnormal PAP  . CARDIAC CATHETERIZATION  04/2014   [facility]  . COLONOSCOPY WITH PROPOFOL N/A 07/11/2017   Procedure: COLONOSCOPY WITH PROPOFOL;  Surgeon: [name], MD;  Location: [place];  Service: Endoscopy;  Laterality: N/A;  . DILATION AND CURETTAGE OF UTERUS    . ESOPHAGOGASTRODUODENOSCOPY (EGD) WITH PROPOFOL N/A 07/11/2017   Procedure: ESOPHAGOGASTRODUODENOSCOPY (EGD) WITH PROPOFOL;  Surgeon: [name], MD;  Location: [place];  Service: Endoscopy;  Laterality: N/A;  diabetic - diet controlled  . GIVENS CAPSULE STUDY N/A 09/23/2017   Procedure: GIVENS CAPSULE STUDY;  Surgeon: Virgel Manifold, MD;  Location: [facility];  Service: Endoscopy;  Laterality: N/A;  . KNEE ARTHROSCOPY Left 06/10/2013   Procedure: LEFT KNEE ARTHROSCOPY KNEE WITH PARTIAL MEDIAL MENISCECTOMY ;  Surgeon: Lorn Junes, MD;  Location: [place];  Service: Orthopedics;  Laterality: Left;  xerofoam, 4x4's, webril, ace wrap, ice wrap  . KNEE ARTHROSCOPY WITH MEDIAL MENISECTOMY Right 06/10/2013   Procedure: RIGHT KNEE ARTHROSCOPY WITH MEDIAL MENISECTOMY;  Surgeon: Lorn Junes, MD;  Location: [place];  Service: Orthopedics;  Laterality: Right;  partial lateral menisectomoy and chondroplasty  . OVARIAN CYST REMOVAL  04-25-10    Family History  Problem Relation Age of Onset  . Early death Mother        Murdered when pt was 44 years old  . Diabetes Mother   . Hypertension Mother   . Gout Mother   . Kidney failure Mother   . Early death Sister 55       due to hemorage  . Alcohol abuse Maternal Uncle   . Alcohol abuse Other   . Arthritis Other   . Heart disease Other   . Other Other        cousin- phlebitis  . Diabetes Maternal Aunt   . Hyperlipidemia Maternal Aunt   . Hypertension Maternal Aunt   . Prostate cancer Maternal Uncle   . Breast cancer Neg Hx     Social History   Socioeconomic History  . Marital status: Married    Spouse name: Not on file  . Number of children: 3  . Years of education: Not on file  . Highest education level: Not on file  Occupational History  . Occupation: Proofreader: [employer]  . Financial resource strain: Not very hard  . Food insecurity    Worry: Never true    Inability: Never true  . Transportation needs    Medical: No    Non-medical: No  Tobacco Use  . Smoking status: Never Smoker  . Smokeless tobacco:  Never Used  Substance and Sexual Activity  . Alcohol use: No    Alcohol/week: 0.0 standard drinks  . Drug use: No  . Sexual activity: Yes    Partners: Male    Birth control/protection: Surgical  Lifestyle  . Physical activity    Days per week: 3 days    Minutes per session: 60 min  . Stress: Very much  Relationships  . Social connections    Talks on phone: More than three times a week    Gets together: More than three times a week    Attends religious service: More than 4 times per year    Active member of club or organization: Yes  Attends meetings of clubs or organizations: More than 4 times per year    Relationship status: Married  . Intimate partner violence    Fear of current or ex partner: No    Emotionally abused: No    Physically abused: No    Forced sexual activity: No  Other Topics Concern  . Not on file  Social History Narrative   Lives in [place] with husband, two children gone . Daughter's are grown and out of the house. Her 49 yo is going to college              Current Outpatient Medications:  .  aspirin EC 81 MG tablet, Take 1 tablet (81 mg total) by mouth daily., Disp: 90 tablet, Rfl: 3 .  Cyanocobalamin (B-12) 1000 MCG SUBL, Place 1 tablet under the tongue daily., Disp: 90 each, Rfl: 1 .  famotidine (PEPCID) 20 MG tablet, Take 1 tablet (20 mg total) by mouth 2 (two) times daily., Disp: 60 tablet, Rfl: 0 .  levocetirizine (XYZAL) 5 MG tablet, Take 1 tablet (5 mg total) by mouth every evening., Disp: 30 tablet, Rfl: 2 .  topiramate (TOPAMAX) 100 MG tablet, Take by mouth daily as needed. , Disp: , Rfl:  .  hydrochlorothiazide (HYDRODIURIL) 12.5 MG tablet, Take 1 tablet (12.5 mg total) by mouth daily. (Patient not taking: Reported on 01/09/2019), Disp: 90 tablet, Rfl: 0 .  SUMAtriptan (IMITREX) 50 MG tablet, Take by mouth daily as needed for migraine. , Disp: , Rfl:   Allergies  Allergen Reactions  . Iodinated Diagnostic Agents Shortness Of Breath  .  Chocolate     Hives, itching  . Chocolate Flavor Other (See Comments)    Hives, itching  . Iohexol     CP and SOB  . Peanut-Containing Drug Products Swelling    cashews  . Pineapple     I personally reviewed active problem list, medication list, allergies, family history with the patient/caregiver today.   ROS  Ten systems reviewed and is negative except as mentioned in HPI   Objective  Vitals:   01/09/19 1058  BP: (!) 137/94  Pulse: 86    Body mass index is 39.65 kg/m.  Physical Exam  Awake, alert and oriented  PHQ2/9: Depression screen [facility] 2/9 01/09/2019 02/11/2017 03/05/2016 12/05/2015 04/20/2015  Decreased Interest 0 0 0 0 0  Down, Depressed, Hopeless 0 0 0 0 0  PHQ - 2 Score 0 0 0 0 0  Altered sleeping 0 - - - -  Tired, decreased energy 0 - - - -  Change in appetite 0 - - - -  Feeling bad or failure about yourself  0 - - - -  Trouble concentrating 0 - - - -  Moving slowly or fidgety/restless 0 - - - -  Suicidal thoughts 0 - - - -  PHQ-9 Score 0 - - - -  Difficult doing work/chores Not difficult at all - - - -   PHQ-2/9 Result is negative.    Fall Risk: Fall Risk  01/09/2019 07/07/2018 10/09/2017 02/11/2017 03/05/2016  Falls in the past year? 1 0 Yes Yes No  Comment Knee Edema and had surgery on it in the past-will lock up on her - - - -  Number falls in past yr: 0 - 2 or more 2 or more -  Injury with Fall? 0 - No No -  Risk Factor Category  - - - High Fall Risk -  Risk for fall due to : - -  Other (Comment) - -  Risk for fall due to: Comment - - anemia/headaches- being followed by Dr. Teresa Pelton. - -  Follow up - - Education provided;Falls prevention discussed - -     Assessment & Plan  1. OSA on CPAP  Resume CPAP machine   2. B12 deficiency  - B12; Future  3. Metabolic syndrome  - Hemoglobin A1c; Future  4. Dyslipidemia  - Lipid panel; Future  5. Hyperglycemia  - Hemoglobin A1c; Future  6. Vestibular migraine  Lost to follow up with  Dr. Manuella Ghazi   7. Essential hypertension  - TSH; Future - Comprehensive Metabolic Panel (CMET); Future - hydrochlorothiazide (HYDRODIURIL) 12.5 MG tablet; Take 1 tablet (12.5 mg total) by mouth daily.  8. History of iron deficiency anemia  - CBC with Differential/Platelet; Future - Ferritin; Future  9. Chronic pain of left knee  - Ambulatory referral to Orthopedic Surgery     Dispense: 90 tablet; Refill: 0 I discussed the assessment and treatment plan with the patient. The patient was provided an opportunity to ask questions and all were answered. The patient agreed with the plan and demonstrated an understanding of the instructions.  The patient was advised to call back or seek an in-person evaluation if the symptoms worsen or if the condition fails to improve as anticipated.  I provided 25  minutes of non-face-to-face time during this encounter.

## 2019-01-19 DIAGNOSIS — M170 Bilateral primary osteoarthritis of knee: Secondary | ICD-10-CM | POA: Diagnosis not present

## 2019-01-23 ENCOUNTER — Other Ambulatory Visit
Admission: RE | Admit: 2019-01-23 | Discharge: 2019-01-23 | Disposition: A | Discharge status: Home / Self Care | Payer: 59 | Attending: Family Medicine | Admitting: Family Medicine

## 2019-01-23 DIAGNOSIS — I10 Essential (primary) hypertension: Secondary | ICD-10-CM | POA: Insufficient documentation

## 2019-01-23 DIAGNOSIS — R739 Hyperglycemia, unspecified: Secondary | ICD-10-CM | POA: Diagnosis not present

## 2019-01-23 DIAGNOSIS — E785 Hyperlipidemia, unspecified: Secondary | ICD-10-CM | POA: Insufficient documentation

## 2019-01-23 DIAGNOSIS — E8881 Metabolic syndrome: Secondary | ICD-10-CM | POA: Insufficient documentation

## 2019-01-23 DIAGNOSIS — Z862 Personal history of diseases of the blood and blood-forming organs and certain disorders involving the immune mechanism: Secondary | ICD-10-CM | POA: Diagnosis not present

## 2019-01-23 DIAGNOSIS — E538 Deficiency of other specified B group vitamins: Secondary | ICD-10-CM | POA: Insufficient documentation

## 2019-01-23 LAB — LIPID PANEL
Cholesterol: 169 mg/dL (ref 0–200)
HDL: 45 mg/dL (ref >40)
LDL Cholesterol: 93 mg/dL (ref 0–99)
Total CHOL/HDL Ratio: 3.8 RATIO
Triglycerides: 153 mg/dL — ABNORMAL HIGH (ref <150)
VLDL: 31 mg/dL (ref 0–40)

## 2019-01-23 LAB — COMPREHENSIVE METABOLIC PANEL
ALT: 16 U/L (ref 0–44)
AST: 19 U/L (ref 15–41)
Albumin: 3.8 g/dL (ref 3.5–5.0)
Alkaline Phosphatase: 80 U/L (ref 38–126)
Anion gap: 8 (ref 5–15)
BUN: 12 mg/dL (ref 6–20)
CO2: 22 mmol/L (ref 22–32)
Calcium: 9.1 mg/dL (ref 8.9–10.3)
Chloride: 107 mmol/L (ref 98–111)
Creatinine, Ser: 0.77 mg/dL (ref 0.44–1.00)
GFR calc Af Amer: >60 mL/min (ref >60)
GFR calc non Af Amer: >60 mL/min (ref >60)
Glucose, Bld: 145 mg/dL — ABNORMAL HIGH (ref 70–99)
Potassium: 3.4 mmol/L — ABNORMAL LOW (ref 3.5–5.1)
Sodium: 137 mmol/L (ref 135–145)
Total Bilirubin: 0.4 mg/dL (ref 0.3–1.2)
Total Protein: 7.2 g/dL (ref 6.5–8.1)

## 2019-01-23 LAB — CBC WITH DIFFERENTIAL/PLATELET
Abs Immature Granulocytes: 0.04 10*3/uL (ref 0.00–0.07)
Basophils Absolute: 0 10*3/uL (ref 0.0–0.1)
Basophils Relative: 1 %
Eosinophils Absolute: 0.1 10*3/uL (ref 0.0–0.5)
Eosinophils Relative: 1 %
HCT: 38.8 % (ref 36.0–46.0)
Hemoglobin: 12.7 g/dL (ref 12.0–15.0)
Immature Granulocytes: 1 %
Lymphocytes Relative: 29 %
Lymphs Abs: 1.8 10*3/uL (ref 0.7–4.0)
MCH: 28.7 pg (ref 26.0–34.0)
MCHC: 32.7 g/dL (ref 30.0–36.0)
MCV: 87.8 fL (ref 80.0–100.0)
Monocytes Absolute: 0.3 10*3/uL (ref 0.1–1.0)
Monocytes Relative: 5 %
Neutro Abs: 3.9 10*3/uL (ref 1.7–7.7)
Neutrophils Relative %: 63 %
Platelets: 262 10*3/uL (ref 150–400)
RBC: 4.42 MIL/uL (ref 3.87–5.11)
RDW: 12.7 % (ref 11.5–15.5)
WBC: 6.2 10*3/uL (ref 4.0–10.5)
nRBC: 0 % (ref 0.0–0.2)

## 2019-01-23 LAB — FERRITIN: Ferritin: 182 ng/mL (ref 11–307)

## 2019-01-23 LAB — TSH: TSH: 0.972 u[IU]/mL (ref 0.350–4.500)

## 2019-01-24 LAB — VITAMIN B12: Vitamin B-12: 213 pg/mL (ref 180–914)

## 2019-01-24 LAB — HEMOGLOBIN A1C
Hgb A1c MFr Bld: 5.9 % — ABNORMAL HIGH (ref 4.8–5.6)
Mean Plasma Glucose: 122.63 mg/dL

## 2019-02-06 ENCOUNTER — Telehealth: Payer: Self-pay | Admitting: Family Medicine

## 2019-02-06 NOTE — Telephone Encounter (Signed)
Pt states that she was prescribed a reflux medication. States that sometimes her chest feels like a bubble and when she belch it goes away (mostly when she wakes up). She would like to know what is causing this. I did schedule her an appt for Monday morning but is this appt needed?

## 2019-02-06 NOTE — Telephone Encounter (Signed)
Pt informed

## 2019-02-09 ENCOUNTER — Other Ambulatory Visit: Payer: Self-pay

## 2019-02-09 ENCOUNTER — Ambulatory Visit (INDEPENDENT_AMBULATORY_CARE_PROVIDER_SITE_OTHER): Payer: 59 | Admitting: Family Medicine

## 2019-02-09 ENCOUNTER — Encounter: Payer: Self-pay | Admitting: Family Medicine

## 2019-02-09 VITALS — BP 129/90 | HR 83 | Wt 214.6 lb

## 2019-02-09 DIAGNOSIS — R1013 Epigastric pain: Secondary | ICD-10-CM

## 2019-02-09 DIAGNOSIS — R142 Eructation: Secondary | ICD-10-CM

## 2019-02-09 DIAGNOSIS — R1011 Right upper quadrant pain: Secondary | ICD-10-CM | POA: Diagnosis not present

## 2019-02-09 DIAGNOSIS — R079 Chest pain, unspecified: Secondary | ICD-10-CM | POA: Diagnosis not present

## 2019-02-09 DIAGNOSIS — R110 Nausea: Secondary | ICD-10-CM

## 2019-02-09 MED ORDER — SIMETHICONE 125 MG PO CAPS: 1.0000 | ORAL_CAPSULE | Freq: Four times a day (QID) | ORAL | 0 refills | Status: DC

## 2019-02-09 MED ORDER — OMEPRAZOLE 40 MG PO CPDR: 40.0000 mg | DELAYED_RELEASE_CAPSULE | Freq: Two times a day (BID) | ORAL | 0 refills | Status: DC

## 2019-02-09 NOTE — Progress Notes (Signed)
Name: Marie Vaughan   MRN: [id number]    DOB: 1971/03/06   Date:02/09/2019       Progress Note  Subjective  Chief Complaint  Chief Complaint  Patient presents with  . Gastroesophageal Reflux    the omeprazole and gas x combination that was recommended did not help. She has not taken any Pepcid this week.    I connected with  Marie Vaughan  on 02/09/19 at  7:40 AM EDT by a video enabled telemedicine application and verified that I am speaking with the correct person using two identifiers.  I discussed the limitations of evaluation and management by telemedicine and the availability of in person appointments. The patient expressed understanding and agreed to proceed. Staff also discussed with the patient that there may be a patient responsible charge related to this service. Patient Location: at work  Provider Location: [facility]   HPI  Dyspepsia: she states she has a history of GERD and seen by GI in the past, however this past week it has been worse, she states pain under her breast ( rib cage) that radiates to her anterior chest, sharp and associated with nausea, she needs to make herself burp and pain resolves temporarily, it is worse with meals but can happen at any time. She is avoid sodas and spicy food, but symptoms are not better. She is taking Omeprazole in am's and is off pepcid. No fever or chills. No change in bowel movements. She feels like she has to eructate all the time and it is embarrassing.    Patient Active Problem List   Diagnosis Date Noted  . History of ovarian cyst 03/05/2018  . Left ovarian cyst 03/05/2018  . Gross hematuria 01/31/2018  . Vaginal bleeding 01/31/2018  . History of hysterectomy 01/31/2018  . Colon cancer screening   . Polyp of sigmoid colon   . Benign neoplasm of transverse colon   . Iron deficiency anemia   . Metabolic syndrome 29/52/8413  . OSA on CPAP 02/03/2016  . 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 Abnormal brain MRI  12/20/2015  . Dizziness 12/13/2015  . Near syncope 12/10/2015  . Atypical chest pain 08/22/2013  . Essential hypertension 08/22/2013  . Angio-edema   . GERD (gastroesophageal reflux disease)   . Migraine with aura and without status migrainosus   . Acne   . Allergic rhinitis 10/05/2011  . Insomnia 08/30/2011  . Obesity 08/30/2011    Past Surgical History:  Procedure Laterality Date  . ABDOMINAL HYSTERECTOMY  04-25-10   Due to abnormal PAP  . CARDIAC CATHETERIZATION  04/2014   [facility]  . COLONOSCOPY WITH PROPOFOL N/A 07/11/2017   Procedure: COLONOSCOPY WITH PROPOFOL;  Surgeon: [name], MD;  Location: [place];  Service: Endoscopy;  Laterality: N/A;  . DILATION AND CURETTAGE OF UTERUS    . ESOPHAGOGASTRODUODENOSCOPY (EGD) WITH PROPOFOL N/A 07/11/2017   Procedure: ESOPHAGOGASTRODUODENOSCOPY (EGD) WITH PROPOFOL;  Surgeon: [name], MD;  Location: [place];  Service: Endoscopy;  Laterality: N/A;  diabetic - diet controlled  . GIVENS CAPSULE STUDY N/A 09/23/2017   Procedure: GIVENS CAPSULE STUDY;  Surgeon: Virgel Manifold, MD;  Location: [facility];  Service: Endoscopy;  Laterality: N/A;  . KNEE ARTHROSCOPY Left 06/10/2013   Procedure: LEFT KNEE ARTHROSCOPY KNEE WITH PARTIAL MEDIAL MENISCECTOMY ;  Surgeon: Lorn Junes, MD;  Location: [place];  Service: Orthopedics;  Laterality: Left;  xerofoam, 4x4's, webril, ace wrap, ice wrap  . KNEE ARTHROSCOPY  WITH MEDIAL MENISECTOMY Right 06/10/2013   Procedure: RIGHT KNEE ARTHROSCOPY WITH MEDIAL MENISECTOMY;  Surgeon: Lorn Junes, MD;  Location: [place];  Service: Orthopedics;  Laterality: Right;  partial lateral menisectomoy and chondroplasty  . OVARIAN CYST REMOVAL  04-25-10    Family History  Problem Relation Age of Onset  . Early death Mother        Murdered when pt was 12 years old  . Diabetes Mother   . Hypertension Mother   . Gout Mother   . Kidney failure  Mother   . Early death Sister 38       due to hemorage  . Alcohol abuse Maternal Uncle   . Alcohol abuse Other   . Arthritis Other   . Heart disease Other   . Other Other        cousin- phlebitis  . Diabetes Maternal Aunt   . Hyperlipidemia Maternal Aunt   . Hypertension Maternal Aunt   . Prostate cancer Maternal Uncle   . Breast cancer Neg Hx     Social History   Socioeconomic History  . Marital status: Married    Spouse name: Not on file  . Number of children: 3  . Years of education: Not on file  . Highest education level: Not on file  Occupational History  . Occupation: Proofreader: [name]  . Financial resource strain: Not very hard  . Food insecurity    Worry: Never true    Inability: Never true  . Transportation needs    Medical: No    Non-medical: No  Tobacco Use  . Smoking status: Never Smoker  . Smokeless tobacco: Never Used  Substance and Sexual Activity  . Alcohol use: No    Alcohol/week: 0.0 standard drinks  . Drug use: No  . Sexual activity: Yes    Partners: Male    Birth control/protection: Surgical  Lifestyle  . Physical activity    Days per week: 3 days    Minutes per session: 60 min  . Stress: Very much  Relationships  . Social connections    Talks on phone: More than three times a week    Gets together: More than three times a week    Attends religious service: More than 4 times per year    Active member of club or organization: Yes    Attends meetings of clubs or organizations: More than 4 times per year    Relationship status: Married  . Intimate partner violence    Fear of current or ex partner: No    Emotionally abused: No    Physically abused: No    Forced sexual activity: No  Other Topics Concern  . Not on file  Social History Narrative   Lives in [place] with husband, two children gone . Daughter's are grown and out of the house. Her 49 yo is going to college              Current  Outpatient Medications:  .  aspirin EC 81 MG tablet, Take 1 tablet (81 mg total) by mouth daily., Disp: 90 tablet, Rfl: 3 .  Cyanocobalamin (B-12) 1000 MCG SUBL, Place 1 tablet under the tongue daily., Disp: 90 each, Rfl: 1 .  hydrochlorothiazide (HYDRODIURIL) 12.5 MG tablet, Take 1 tablet (12.5 mg total) by mouth daily., Disp: 90 tablet, Rfl: 0 .  levocetirizine (XYZAL) 5 MG tablet, Take 1 tablet (5 mg total) by mouth  every evening., Disp: 30 tablet, Rfl: 2 .  SUMAtriptan (IMITREX) 50 MG tablet, Take 1 tablet (50 mg total) by mouth daily as needed for migraine., Disp: 10 tablet, Rfl: 2 .  topiramate (TOPAMAX) 100 MG tablet, Take 1 tablet (100 mg total) by mouth daily as needed., Disp: 180 tablet, Rfl: 0 .  famotidine (PEPCID) 20 MG tablet, Take 1 tablet (20 mg total) by mouth 2 (two) times daily., Disp: 60 tablet, Rfl: 0  Allergies  Allergen Reactions  . Iodinated Diagnostic Agents Shortness Of Breath  . Chocolate     Hives, itching  . Chocolate Flavor Other (See Comments)    Hives, itching  . Iohexol     CP and SOB  . Peanut-Containing Drug Products Swelling    cashews  . Pineapple     I personally reviewed active problem list, medication list, allergies, family history, social history with the patient/caregiver today.   ROS  Ten systems reviewed and is negative except as mentioned in HPI   Objective  Virtual encounter, vitals not obtained.  Body mass index is 39.25 kg/m.  Physical Exam  Awake, alert and oriented   PHQ2/9: Depression screen [facility] 2/9 02/09/2019 01/09/2019 02/11/2017 03/05/2016 12/05/2015  Decreased Interest 0 0 0 0 0  Down, Depressed, Hopeless 0 0 0 0 0  PHQ - 2 Score 0 0 0 0 0  Altered sleeping 0 0 - - -  Tired, decreased energy 0 0 - - -  Change in appetite 0 0 - - -  Feeling bad or failure about yourself  0 0 - - -  Trouble concentrating 0 0 - - -  Moving slowly or fidgety/restless 0 0 - - -  Suicidal thoughts 0 0 - - -  PHQ-9 Score 0 0 - - -   Difficult doing work/chores - Not difficult at all - - -   PHQ-2/9 Result is negative.    Fall Risk: Fall Risk  02/09/2019 01/09/2019 07/07/2018 10/09/2017 02/11/2017  Falls in the past year? 1 1 0 Yes Yes  Comment - Knee Edema and had surgery on it in the past-will lock up on her - - -  Number falls in past yr: 1 0 - 2 or more 2 or more  Injury with Fall? 0 0 - No No  Risk Factor Category  - - - - High Fall Risk  Risk for fall due to : - - - Other (Comment) -  Risk for fall due to: Comment - - - anemia/headaches- being followed by Dr. Teresa Pelton. -  Follow up - - - Education provided;Falls prevention discussed -     Assessment & Plan   1. Dyspepsia  - US Abdomen Limited RUQ; Future - H. pylori breath test; Future - omeprazole (PRILOSEC) 40 MG capsule; Take 1 capsule (40 mg total) by mouth 2 (two) times a day.  Dispense: 60 capsule; Refill: 0 - Simethicone 125 MG CAPS; Take 1 capsule (125 mg total) by mouth 4 (four) times daily.  Dispense: 60 capsule; Refill: 0  2. Nausea  - US Abdomen Limited RUQ; Future - H. pylori breath test; Future  3. Right-sided chest pain  - US Abdomen Limited RUQ; Future  4. Right upper quadrant pain  - US Abdomen Limited RUQ; Future  5. Eructation  - Simethicone 125 MG CAPS; Take 1 capsule (125 mg total) by mouth 4 (four) times daily.  Dispense: 60 capsule; Refill: 0 I discussed the assessment and treatment plan with the patient. The patient  was provided an opportunity to ask questions and all were answered. The patient agreed with the plan and demonstrated an understanding of the instructions.  The patient was advised to call back or seek an in-person evaluation if the symptoms worsen or if the condition fails to improve as anticipated.  I provided 25  minutes of non-face-to-face time during this encounter.

## 2019-02-10 ENCOUNTER — Other Ambulatory Visit
Admission: RE | Admit: 2019-02-10 | Discharge: 2019-02-10 | Disposition: A | Discharge status: Home / Self Care | Payer: 59 | Source: Ambulatory Visit | Attending: Family Medicine | Admitting: Family Medicine

## 2019-02-10 DIAGNOSIS — R110 Nausea: Secondary | ICD-10-CM | POA: Insufficient documentation

## 2019-02-10 DIAGNOSIS — R1013 Epigastric pain: Secondary | ICD-10-CM | POA: Insufficient documentation

## 2019-02-11 ENCOUNTER — Encounter: Payer: Self-pay | Admitting: Family Medicine

## 2019-02-11 LAB — H. PYLORI BREATH TEST: H. pylori UBiT: NEGATIVE

## 2019-02-12 ENCOUNTER — Other Ambulatory Visit: Payer: Self-pay | Admitting: Family Medicine

## 2019-02-12 DIAGNOSIS — R1013 Epigastric pain: Secondary | ICD-10-CM

## 2019-02-13 ENCOUNTER — Encounter: Payer: Self-pay | Admitting: Gastroenterology

## 2019-02-13 ENCOUNTER — Ambulatory Visit (INDEPENDENT_AMBULATORY_CARE_PROVIDER_SITE_OTHER): Payer: 59 | Admitting: Gastroenterology

## 2019-02-13 DIAGNOSIS — D509 Iron deficiency anemia, unspecified: Secondary | ICD-10-CM | POA: Diagnosis not present

## 2019-02-13 DIAGNOSIS — K219 Gastro-esophageal reflux disease without esophagitis: Secondary | ICD-10-CM

## 2019-02-13 DIAGNOSIS — R1013 Epigastric pain: Secondary | ICD-10-CM

## 2019-02-13 NOTE — Progress Notes (Signed)
Marie Vaughan [address]  [place]  [place], [place] [address]  Main: [phone]  Fax: [phone]   Gastroenterology Consultation  Referring Provider:     Steele Sizer, MD Primary Care Physician:  Steele Sizer, MD Reason for Consultation:     Dyspepsia         HPI:   Virtual Visit via Video Note  I connected with patient on 02/13/19 at 11:30 AM EDT by video ([email or web address]) and verified that I am speaking with the correct person using two identifiers.   I discussed the limitations, risks, security and privacy concerns of performing an evaluation and management service by video and the availability of in person appointments. I also discussed with the patient that there may be a patient responsible charge related to this service. The patient expressed understanding and agreed to proceed.  Location of the patient: Home Location of provider: Home Participating persons: Patient and provider only (Nursing staff checked in patient via phone but were not physically involved in the video interaction - see their notes)   History of Present Illness: Chief Complaint  Patient presents with   Dyspepsia    Marie Vaughan is a 48 y.o. y/o female referred for consultation & management  by Dr. Steele Sizer, MD.  Patient reports intermittent chest pain followed by having to work to make the pain go away.  After she burps the pain goes away.  Occurring multiple times a day.  She was started on omeprazole twice daily by primary care provider about 4 days ago.  She states this has not helped.  Prior to this she was taking Pepcid which was not helping.  She also has simethicone which she takes twice a day scheduled.  No dysphagia.  No nausea or vomiting.  Reports dyspepsia symptoms as well.  No altered bowel habits.  No blood in stool.  Patient underwent upper and lower endoscopy in December 2018 along with small bowel capsule study due to iron deficiency anemia.  She has history of  hysterectomy in 2010  Colonoscopy with 2 subcentimeter polyps removed and showed tubular adenoma.  Repeat recommended in 5 years.  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  Recent blood work this month shows normal hemoglobin, normal MCV and normal ferritin  Past Medical History:  Diagnosis Date   Acne    Acute bronchitis    Angio-edema    Anxiety    Anxiety state, unspecified    Cervicalgia    Contact lens/glasses fitting    wears contacts or glasses   Dysmenorrhea    GERD (gastroesophageal reflux disease)    History of cardiac cath    a. 04/29/2014: LM nl, LAD no dz, D1 no dz, D2 very small vessel, D3 very small vessel, LCx nl, OM1 very small vessel, OM2 medium sized vessel, OM 3 no dz, RCA no dz, PDA no dz    History of stress test    a. Lexiscan 08/25/13: no sig ischemia, attenuation artifact noted, no sig WMA noted, EF 69%, no EKG changes concerning for ischemia   HTN (hypertension)    Iron deficiency anemia secondary to blood loss (chronic)    Migraine with aura, without mention of intractable migraine without mention of status migrainosus    "vestibular" per pt   Obesity    Other disorders of bone and cartilage(733.99)    Pain in thoracic spine    Pharyngitis    Tear  of medial meniscus of left knee    Tear of medial meniscus of right knee    Vaginal delivery    x 3   Wears contact lenses     Past Surgical History:  Procedure Laterality Date   ABDOMINAL HYSTERECTOMY  04-25-10   Due to abnormal PAP   CARDIAC CATHETERIZATION  04/2014   [facility]   COLONOSCOPY WITH PROPOFOL N/A 07/11/2017   Procedure: COLONOSCOPY WITH PROPOFOL;  Surgeon: [name], MD;  Location: [place];  Service: Endoscopy;  Laterality: N/A;   DILATION AND CURETTAGE OF UTERUS     ESOPHAGOGASTRODUODENOSCOPY (EGD) WITH PROPOFOL N/A 07/11/2017   Procedure: ESOPHAGOGASTRODUODENOSCOPY (EGD) WITH  PROPOFOL;  Surgeon: [name], MD;  Location: [place];  Service: Endoscopy;  Laterality: N/A;  diabetic - diet controlled   GIVENS CAPSULE STUDY N/A 09/23/2017   Procedure: GIVENS CAPSULE STUDY;  Surgeon: Virgel Manifold, MD;  Location: [facility];  Service: Endoscopy;  Laterality: N/A;   KNEE ARTHROSCOPY Left 06/10/2013   Procedure: LEFT KNEE ARTHROSCOPY KNEE WITH PARTIAL MEDIAL MENISCECTOMY ;  Surgeon: Lorn Junes, MD;  Location: [place];  Service: Orthopedics;  Laterality: Left;  xerofoam, 4x4's, webril, ace wrap, ice wrap   KNEE ARTHROSCOPY WITH MEDIAL MENISECTOMY Right 06/10/2013   Procedure: RIGHT KNEE ARTHROSCOPY WITH MEDIAL MENISECTOMY;  Surgeon: Lorn Junes, MD;  Location: [place];  Service: Orthopedics;  Laterality: Right;  partial lateral menisectomoy and chondroplasty   OVARIAN CYST REMOVAL  04-25-10    Prior to Admission medications   Medication Sig Start Date End Date Taking? Authorizing Provider  aspirin EC 81 MG tablet Take 1 tablet (81 mg total) by mouth daily. 04/21/14  Yes Dunn, Ryan M, PA-C  Cyanocobalamin (B-12) 1000 MCG SUBL Place 1 tablet under the tongue daily. 08/01/17  Yes Sowles, Drue Stager, MD  hydrochlorothiazide (HYDRODIURIL) 12.5 MG tablet Take 1 tablet (12.5 mg total) by mouth daily. 01/09/19  Yes Sowles, Drue Stager, MD  levocetirizine (XYZAL) 5 MG tablet Take 1 tablet (5 mg total) by mouth every evening. 10/31/17  Yes Scot Jun, FNP  omeprazole (PRILOSEC) 40 MG capsule Take 1 capsule (40 mg total) by mouth 2 (two) times a day. 02/09/19  Yes Sowles, Drue Stager, MD  Simethicone 125 MG CAPS Take 1 capsule (125 mg total) by mouth 4 (four) times daily. 02/09/19  Yes Sowles, Drue Stager, MD  SUMAtriptan (IMITREX) 50 MG tablet Take 1 tablet (50 mg total) by mouth daily as needed for migraine. 01/09/19  Yes Sowles, Drue Stager, MD  topiramate (TOPAMAX) 100 MG tablet Take 1 tablet (100 mg total) by mouth daily as needed.  01/09/19  Yes Sowles, Drue Stager, MD  famotidine (PEPCID) 20 MG tablet Take 1 tablet (20 mg total) by mouth 2 (two) times daily. 06/17/17 01/09/19  Darel Hong, MD    Family History  Problem Relation Age of Onset   Early death Mother        Murdered when pt was 54 years old   Diabetes Mother    Hypertension Mother    Gout Mother    Kidney failure Mother    Early death Sister 70       due to hemorage   Alcohol abuse Maternal Uncle    Alcohol abuse Other    Arthritis Other    Heart disease Other    Other Other        cousin- phlebitis   Diabetes Maternal Aunt    Hyperlipidemia Maternal Aunt  Hypertension Maternal Aunt    Prostate cancer Maternal Uncle    Breast cancer Neg Hx      Social History   Tobacco Use   Smoking status: Never Smoker   Smokeless tobacco: Never Used  Substance Use Topics   Alcohol use: No    Alcohol/week: 0.0 standard drinks   Drug use: No    Allergies as of 02/13/2019 - Review Complete 02/13/2019  Allergen Reaction Noted   Iodinated diagnostic agents Shortness Of Breath    Chocolate  10/27/2013   Chocolate flavor Other (See Comments) 10/27/2013   Iohexol  01/05/2013   Peanut-containing drug products Swelling 06/08/2013   Pineapple  06/17/2017    Review of Systems:    All systems reviewed and negative except where noted in HPI.   Observations/Objective:  Labs: CBC    Component Value Date/Time   WBC 6.2 01/23/2019 1024   RBC 4.42 01/23/2019 1024   HGB 12.7 01/23/2019 1024   HGB 11.2 12/07/2015 0844   HCT 38.8 01/23/2019 1024   HCT 33.9 (L) 12/07/2015 0844   PLT 262 01/23/2019 1024   PLT 299 12/07/2015 0844   MCV 87.8 01/23/2019 1024   MCV 85 12/07/2015 0844   MCV 83 08/10/2012 0411   MCH 28.7 01/23/2019 1024   MCHC 32.7 01/23/2019 1024   RDW 12.7 01/23/2019 1024   RDW 13.9 12/07/2015 0844   RDW 14.3 08/10/2012 0411   LYMPHSABS 1.8 01/23/2019 1024   LYMPHSABS 2.0 12/07/2015 0844   MONOABS 0.3  01/23/2019 1024   EOSABS 0.1 01/23/2019 1024   EOSABS 0.1 12/07/2015 0844   BASOSABS 0.0 01/23/2019 1024   BASOSABS 0.0 12/07/2015 0844   CMP     Component Value Date/Time   NA 137 01/23/2019 1024   NA 141 12/07/2015 0844   NA 141 08/10/2012 0411   K 3.4 (L) 01/23/2019 1024   K 3.8 08/10/2012 0411   CL 107 01/23/2019 1024   CL 110 (H) 08/10/2012 0411   CO2 22 01/23/2019 1024   CO2 20 (L) 08/10/2012 0411   GLUCOSE 145 (H) 01/23/2019 1024   GLUCOSE 155 (H) 08/10/2012 0411   BUN 12 01/23/2019 1024   BUN 10 12/07/2015 0844   BUN 9 08/10/2012 0411   CREATININE 0.77 01/23/2019 1024   CREATININE 0.74 08/10/2012 0411   CALCIUM 9.1 01/23/2019 1024   CALCIUM 8.8 08/10/2012 0411   PROT 7.2 01/23/2019 1024   PROT 6.3 12/07/2015 0844   ALBUMIN 3.8 01/23/2019 1024   ALBUMIN 3,900 12/13/2015 1000   AST 19 01/23/2019 1024   ALT 16 01/23/2019 1024   ALKPHOS 80 01/23/2019 1024   BILITOT 0.4 01/23/2019 1024   BILITOT <0.2 12/07/2015 0844   GFRNONAA >60 01/23/2019 1024   GFRNONAA >60 08/10/2012 0411   GFRAA >60 01/23/2019 1024   GFRAA >60 08/10/2012 0411    Imaging Studies: No results found.  Assessment and Plan:   Marie Vaughan is a 48 y.o. y/o female has been referred for dyspepsia  Assessment and Plan: H. pylori breath test was negative but was done while patient was on PPI and therefore can be false negative  We will order H. pylori serology to confirm negative H. Pylori  Patient's chest pain symptoms along with frequent belching are likely also related to reflux.  I have discussed ways to avoid aerophagia, by avoiding drinking with a straw, avoiding swallowing air, avoiding carbonated drinks.  Patient drinks sparkling water and she was asked to avoid this.  I  have discussed that PPI should be given 2 to 4 weeks to work before we can say that they have not worked  If her symptoms are not better in 2 weeks, we can add Pepcid at bedtime  (Risks of PPI use were discussed  with patient including bone loss, C. Diff diarrhea, pneumonia, infections, CKD, electrolyte abnormalities.   Pt. Verbalizes understanding and chooses to continue the medication.)  I have also asked her to take her simethicone at the time of her burping symptoms and not just as scheduled to see if it helps with her symptoms.  She verbalized understanding.  She has already had an EGD and reports following with cardiology in the past due to chest pain and she was told she does not have any cardiac reason for chest pain in the past.  She states she saw Dr. Fletcher Anon  Given his chronic history of noncardiac chest pain, and ongoing symptoms despite full dose PPI therapy, we discussed pH monitoring with manometry.  Patient is agreeable to the referral.  Will place for [facility].  I have asked her to call us if she has not heard from [facility] about an appointment in the next 2 to 3 weeks.   Her ferritin level is normal and she does not have any anemia.  During future visits, can continue to monitor her hemoglobin and if it decreases, can consider push or Balloon enteroscopy to treat her AVMs.  They are asymptomatic at this time  Follow Up Instructions: Follow-up in 6 to 8 weeks  I discussed the assessment and treatment plan with the patient. The patient was provided an opportunity to ask questions and all were answered. The patient agreed with the plan and demonstrated an understanding of the instructions.   The patient was advised to call back or seek an in-person evaluation if the symptoms worsen or if the condition fails to improve as anticipated.  I provided 30 minutes of face-to-face time via video software during this encounter.  Additional time was spent in reviewing patient's chart, placing orders etc.   Virgel Manifold, MD  Speech recognition software was used to dictate the above note.

## 2019-02-16 ENCOUNTER — Other Ambulatory Visit: Payer: Self-pay

## 2019-02-17 ENCOUNTER — Ambulatory Visit
Admission: RE | Admit: 2019-02-17 | Discharge: 2019-02-17 | Disposition: A | Discharge status: Home / Self Care | Payer: 59 | Source: Ambulatory Visit | Attending: Family Medicine | Admitting: Family Medicine

## 2019-02-17 ENCOUNTER — Other Ambulatory Visit: Payer: Self-pay

## 2019-02-17 ENCOUNTER — Other Ambulatory Visit
Admission: RE | Admit: 2019-02-17 | Discharge: 2019-02-17 | Disposition: A | Discharge status: Home / Self Care | Payer: 59 | Source: Ambulatory Visit | Attending: Gastroenterology | Admitting: Gastroenterology

## 2019-02-17 DIAGNOSIS — R110 Nausea: Secondary | ICD-10-CM | POA: Insufficient documentation

## 2019-02-17 DIAGNOSIS — R1011 Right upper quadrant pain: Secondary | ICD-10-CM | POA: Insufficient documentation

## 2019-02-17 DIAGNOSIS — R1013 Epigastric pain: Secondary | ICD-10-CM | POA: Diagnosis not present

## 2019-02-17 DIAGNOSIS — R079 Chest pain, unspecified: Secondary | ICD-10-CM | POA: Diagnosis not present

## 2019-02-17 NOTE — Addendum Note (Signed)
Addended by: Santiago Bur on: 02/17/2019 08:49 AM   Modules accepted: Orders

## 2019-02-18 LAB — H PYLORI, IGM, IGG, IGA AB
H Pylori IgG: 0.4 Index Value (ref 0.00–0.79)
H. Pylogi, Iga Abs: <9 units (ref 0.0–8.9)
H. Pylogi, Igm Abs: <9 units (ref 0.0–8.9)

## 2019-03-03 ENCOUNTER — Telehealth: Payer: Self-pay

## 2019-03-03 NOTE — Telephone Encounter (Signed)
-----   Message from Virgel Manifold, MD sent at 02/23/2019 11:21 AM EDT ----- H pylori testing was negative. You will need the pH and manometry test we discussed. Please call our office if you have not received a call to schedule this in 1-2 weeks.

## 2019-03-03 NOTE — Telephone Encounter (Signed)
Pt notified of lab results via Katherine. Pt is scheduled with [facility] on 04/07/19 for PH/Manometry test.

## 2019-03-09 DIAGNOSIS — M170 Bilateral primary osteoarthritis of knee: Secondary | ICD-10-CM | POA: Diagnosis not present

## 2019-04-04 DIAGNOSIS — Z20828 Contact with and (suspected) exposure to other viral communicable diseases: Secondary | ICD-10-CM | POA: Diagnosis not present

## 2019-04-04 DIAGNOSIS — Z01812 Encounter for preprocedural laboratory examination: Secondary | ICD-10-CM | POA: Diagnosis not present

## 2019-04-04 DIAGNOSIS — Z1159 Encounter for screening for other viral diseases: Secondary | ICD-10-CM | POA: Diagnosis not present

## 2019-04-07 DIAGNOSIS — R079 Chest pain, unspecified: Secondary | ICD-10-CM | POA: Diagnosis not present

## 2019-04-07 DIAGNOSIS — R1310 Dysphagia, unspecified: Secondary | ICD-10-CM | POA: Diagnosis not present

## 2019-04-07 DIAGNOSIS — K219 Gastro-esophageal reflux disease without esophagitis: Secondary | ICD-10-CM | POA: Diagnosis not present

## 2019-04-07 DIAGNOSIS — R6889 Other general symptoms and signs: Secondary | ICD-10-CM | POA: Diagnosis not present

## 2019-04-07 DIAGNOSIS — R142 Eructation: Secondary | ICD-10-CM | POA: Diagnosis not present

## 2019-04-09 DIAGNOSIS — R1314 Dysphagia, pharyngoesophageal phase: Secondary | ICD-10-CM | POA: Diagnosis not present

## 2019-04-09 DIAGNOSIS — K228 Other specified diseases of esophagus: Secondary | ICD-10-CM | POA: Diagnosis not present

## 2019-04-13 ENCOUNTER — Ambulatory Visit: Payer: 59 | Admitting: Family Medicine

## 2019-04-15 ENCOUNTER — Encounter: Payer: Self-pay | Admitting: Family Medicine

## 2019-04-15 ENCOUNTER — Ambulatory Visit (INDEPENDENT_AMBULATORY_CARE_PROVIDER_SITE_OTHER): Payer: 59 | Admitting: Family Medicine

## 2019-04-15 ENCOUNTER — Encounter: Payer: Self-pay | Admitting: Student

## 2019-04-15 ENCOUNTER — Encounter: Payer: Self-pay | Admitting: Gastroenterology

## 2019-04-15 VITALS — BP 145/94 | HR 88 | Ht 62.0 in | Wt 218.8 lb

## 2019-04-15 DIAGNOSIS — J069 Acute upper respiratory infection, unspecified: Secondary | ICD-10-CM

## 2019-04-15 DIAGNOSIS — M1712 Unilateral primary osteoarthritis, left knee: Secondary | ICD-10-CM

## 2019-04-15 DIAGNOSIS — E538 Deficiency of other specified B group vitamins: Secondary | ICD-10-CM | POA: Diagnosis not present

## 2019-04-15 DIAGNOSIS — E8881 Metabolic syndrome: Secondary | ICD-10-CM

## 2019-04-15 DIAGNOSIS — G43109 Migraine with aura, not intractable, without status migrainosus: Secondary | ICD-10-CM

## 2019-04-15 DIAGNOSIS — I10 Essential (primary) hypertension: Secondary | ICD-10-CM | POA: Diagnosis not present

## 2019-04-15 DIAGNOSIS — G4733 Obstructive sleep apnea (adult) (pediatric): Secondary | ICD-10-CM | POA: Diagnosis not present

## 2019-04-15 DIAGNOSIS — Z9989 Dependence on other enabling machines and devices: Secondary | ICD-10-CM | POA: Diagnosis not present

## 2019-04-15 DIAGNOSIS — G43809 Other migraine, not intractable, without status migrainosus: Secondary | ICD-10-CM

## 2019-04-15 DIAGNOSIS — E785 Hyperlipidemia, unspecified: Secondary | ICD-10-CM | POA: Diagnosis not present

## 2019-04-15 MED ORDER — TOPIRAMATE 100 MG PO TABS: 100.0000 mg | ORAL_TABLET | Freq: Every day | ORAL | 0 refills | Status: DC | PRN

## 2019-04-15 MED ORDER — VALSARTAN-HYDROCHLOROTHIAZIDE 160-12.5 MG PO TABS: 1.0000 | ORAL_TABLET | Freq: Every day | ORAL | 0 refills | Status: DC

## 2019-04-15 MED ORDER — DICLOFENAC SODIUM 1 % TD GEL: 4.0000 g | Freq: Four times a day (QID) | TRANSDERMAL | 0 refills | Status: AC

## 2019-04-15 MED ORDER — SUMATRIPTAN SUCCINATE 50 MG PO TABS: 50.0000 mg | ORAL_TABLET | Freq: Every day | ORAL | 2 refills | Status: DC | PRN

## 2019-04-15 NOTE — Progress Notes (Signed)
Name: Marie Vaughan   MRN: [id number]    DOB: [birth year]   Date:04/15/2019       Progress Note  Subjective  Chief Complaint  Chief Complaint  Patient presents with  . Medication Refill    6 month F/U  . Hypertension    Headaches-due to missing BP medication for 3 days   . OSA on CPAP  . Migraine    States tehy have been manageable  . Insomnia    Worst part is falling off to sleep  . Obesity  . Metabolic Syndrome    I connected with  [name] on 04/15/19 at  3:00 PM EDT by telephone and verified that I am speaking with the correct person using two identifiers.  I discussed the limitations, risks, security and privacy concerns of performing an evaluation and management service by telephone and the availability of in person appointments. Staff also discussed with the patient that there may be a patient responsible charge related to this service. Patient Location: at work  Provider Location: [facility]   HPI  Dyspepsia: seen by GI , she had and EGD in 2018 and again in 2019, recently had manometry done and it says on report hypotensive upper esophageal sphincter. She continues to have to force self to eructate to help with pain . She is back on PPI and symptoms are unchanged, she was advised to discuss results with GI  HTN: she is currently not taking medications, she skipped HCTZ for 4 days last week, BP has been high and she has noticed a dull headache. Discussed options, we will change to valsartan hctz and follow up in one month   Migraine/vestibular: Shetake topamax prn , , she lost to follow up with Dr. Manuella Ghazi. Given Imitrex but gets very groggy on it. She continues to have episodes of dizziness. She states episodes about once or twice a week, explained we can try injectables for prevention but she refused at this time  Paresthesia: on tips of her fingers, we will recheck B12 level  OSA: moderate to severe based on sleep study, started on CPAP at 9 cm  H2O July 2017,she states she has not been compliant with CPAP machine lately, wearing her mask at most 1-2 times a week, unchanged. I explained that it may be a contributor of headaches and high bp   History of iron deficiency anemia: history of iron infusion, we will recheck level  Insomnia: she watches TV on her phone, states when she goes to bed she falls alseep  Metabolic syndrome: she denies polyphagia, but has polydipsia, she has episodes of polyuria ( usually when taking HCTZ) last HDL 46, history of HTN and increase in abdominal girth. Reviewed last labs   Left knee pain: she saw ortho and was given diclofenac 25 mg, explained prefer topical medication since BP is high and she has dyspepsia    Patient Active Problem List   Diagnosis Date Noted  . History of ovarian cyst 03/05/2018  . Left ovarian cyst 03/05/2018  . Gross hematuria 01/31/2018  . Vaginal bleeding 01/31/2018  . History of hysterectomy 01/31/2018  . Colon cancer screening   . Polyp of sigmoid colon   . Benign neoplasm of transverse colon   . Iron deficiency anemia   . Metabolic syndrome [id number]  . OSA on CPAP 02/03/2016  . 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 Abnormal brain MRI 12/20/2015  . Dizziness 12/13/2015  . Near syncope 12/10/2015  . Atypical chest pain  08/22/2013  . Essential hypertension 08/22/2013  . Angio-edema   . GERD (gastroesophageal reflux disease)   . Migraine with aura and without status migrainosus   . Acne   . Allergic rhinitis 10/05/2011  . Insomnia 08/30/2011  . Obesity 08/30/2011    Past Surgical History:  Procedure Laterality Date  . ABDOMINAL HYSTERECTOMY  04-25-10   Due to abnormal PAP  . CARDIAC CATHETERIZATION  04/2014   [facility]  . COLONOSCOPY WITH PROPOFOL N/A 07/11/2017   Procedure: COLONOSCOPY WITH PROPOFOL;  Surgeon: [name], MD;  Location: [place];  Service: Endoscopy;  Laterality: N/A;  . DILATION AND CURETTAGE OF UTERUS    .  ESOPHAGOGASTRODUODENOSCOPY (EGD) WITH PROPOFOL N/A 07/11/2017   Procedure: ESOPHAGOGASTRODUODENOSCOPY (EGD) WITH PROPOFOL;  Surgeon: [name], MD;  Location: [place];  Service: Endoscopy;  Laterality: N/A;  diabetic - diet controlled  . GIVENS CAPSULE STUDY N/A 09/23/2017   Procedure: GIVENS CAPSULE STUDY;  Surgeon: Virgel Manifold, MD;  Location: [facility];  Service: Endoscopy;  Laterality: N/A;  . KNEE ARTHROSCOPY Left 06/10/2013   Procedure: LEFT KNEE ARTHROSCOPY KNEE WITH PARTIAL MEDIAL MENISCECTOMY ;  Surgeon: Lorn Junes, MD;  Location: [place];  Service: Orthopedics;  Laterality: Left;  xerofoam, 4x4's, webril, ace wrap, ice wrap  . KNEE ARTHROSCOPY WITH MEDIAL MENISECTOMY Right 06/10/2013   Procedure: RIGHT KNEE ARTHROSCOPY WITH MEDIAL MENISECTOMY;  Surgeon: Lorn Junes, MD;  Location: [place];  Service: Orthopedics;  Laterality: Right;  partial lateral menisectomoy and chondroplasty  . OVARIAN CYST REMOVAL  04-25-10    Family History  Problem Relation Age of Onset  . Early death Mother        Murdered when pt was 6 years old  . Diabetes Mother   . Hypertension Mother   . Gout Mother   . Kidney failure Mother   . Early death Sister 23       due to hemorage  . Alcohol abuse Maternal Uncle   . Alcohol abuse Other   . Arthritis Other   . Heart disease Other   . Other Other        cousin- phlebitis  . Diabetes Maternal Aunt   . Hyperlipidemia Maternal Aunt   . Hypertension Maternal Aunt   . Prostate cancer Maternal Uncle   . Breast cancer Neg Hx     Social History   Socioeconomic History  . Marital status: Married    Spouse name: Not on file  . Number of children: 3  . Years of education: Not on file  . Highest education level: Not on file  Occupational History  . Occupation: Proofreader: [employer]  . Financial resource strain: Not very hard  . Food insecurity     Worry: Never true    Inability: Never true  . Transportation needs    Medical: No    Non-medical: No  Tobacco Use  . Smoking status: Never Smoker  . Smokeless tobacco: Never Used  Substance and Sexual Activity  . Alcohol use: No    Alcohol/week: 0.0 standard drinks  . Drug use: No  . Sexual activity: Yes    Partners: Male    Birth control/protection: Surgical  Lifestyle  . Physical activity    Days per week: 3 days    Minutes per session: 60 min  . Stress: Very much  Relationships  . Social Herbalist on phone:  More than three times a week    Gets together: More than three times a week    Attends religious service: More than 4 times per year    Active member of club or organization: Yes    Attends meetings of clubs or organizations: More than 4 times per year    Relationship status: Married  . Intimate partner violence    Fear of current or ex partner: No    Emotionally abused: No    Physically abused: No    Forced sexual activity: No  Other Topics Concern  . Not on file  Social History Narrative   Lives in [place] with husband, two children gone . Daughter's are grown and out of the house. Her 49 yo is going to college              Current Outpatient Medications:  .  aspirin EC 81 MG tablet, Take 1 tablet (81 mg total) by mouth daily., Disp: 90 tablet, Rfl: 3 .  Cyanocobalamin (B-12) 1000 MCG SUBL, Place 1 tablet under the tongue daily., Disp: 90 each, Rfl: 1 .  diclofenac (VOLTAREN) 25 MG EC tablet, Take by mouth., Disp: , Rfl:  .  ferrous sulfate 324 (65 Fe) MG TBEC, Take by mouth., Disp: , Rfl:  .  hydrochlorothiazide (HYDRODIURIL) 12.5 MG tablet, Take 1 tablet (12.5 mg total) by mouth daily., Disp: 90 tablet, Rfl: 0 .  omeprazole (PRILOSEC) 40 MG capsule, Take 1 capsule (40 mg total) by mouth 2 (two) times a day., Disp: 60 capsule, Rfl: 0 .  SUMAtriptan (IMITREX) 50 MG tablet, Take 1 tablet (50 mg total) by mouth daily as needed for migraine., Disp: 10  tablet, Rfl: 2 .  topiramate (TOPAMAX) 100 MG tablet, Take 1 tablet (100 mg total) by mouth daily as needed., Disp: 180 tablet, Rfl: 0 .  levocetirizine (XYZAL) 5 MG tablet, Take 1 tablet (5 mg total) by mouth every evening. (Patient not taking: Reported on 04/15/2019), Disp: 30 tablet, Rfl: 2 .  Simethicone 125 MG CAPS, Take 1 capsule (125 mg total) by mouth 4 (four) times daily. (Patient not taking: Reported on 04/15/2019), Disp: 60 capsule, Rfl: 0  Allergies  Allergen Reactions  . Iodinated Diagnostic Agents Shortness Of Breath  . Chocolate     Hives, itching  . Chocolate Flavor Other (See Comments)    Hives, itching  . Iohexol     CP and SOB  . Peanut-Containing Drug Products Swelling    cashews  . Pineapple     I personally reviewed active problem list, medication list, allergies, family history, social history, health maintenance with the patient/caregiver today.   ROS  Ten systems reviewed and is negative except as mentioned in HPI   Objective  Virtual encounter, vitals obtained at home  Vitals:   04/15/19 1331  BP: (!) 145/94  Pulse: 88    Body mass index is 40.02 kg/m.  Physical Exam  Awake, alert and oriented  PHQ2/9: Depression screen [facility] 2/9 04/15/2019 02/09/2019 01/09/2019 02/11/2017 03/05/2016  Decreased Interest 0 0 0 0 0  Down, Depressed, Hopeless 0 0 0 0 0  PHQ - 2 Score 0 0 0 0 0  Altered sleeping 1 0 0 - -  Tired, decreased energy 0 0 0 - -  Change in appetite 0 0 0 - -  Feeling bad or failure about yourself  0 0 0 - -  Trouble concentrating 0 0 0 - -  Moving slowly or fidgety/restless 0 0  0 - -  Suicidal thoughts 0 0 0 - -  PHQ-9 Score 1 0 0 - -  Difficult doing work/chores Not difficult at all - Not difficult at all - -   PHQ-2/9 Result is negative.    Fall Risk: Fall Risk  04/15/2019 02/09/2019 01/09/2019 07/07/2018 10/09/2017  Falls in the past year? 1 1 1  0 Yes  Comment - - Knee Edema and had surgery on it in the past-will lock up on her - -   Number falls in past yr: 1 1 0 - 2 or more  Injury with Fall? 0 0 0 - No  Risk Factor Category  - - - - -  Risk for fall due to : - - - - Other (Comment)  Risk for fall due to: Comment - - - - anemia/headaches- being followed by Dr. Teresa Pelton.  Follow up - - - - Education provided;Falls prevention discussed     Assessment & Plan  1. Vestibular migraine  - topiramate (TOPAMAX) 100 MG tablet; Take 1 tablet (100 mg total) by mouth daily as needed.  Dispense: 180 tablet; Refill: 0 - SUMAtriptan (IMITREX) 50 MG tablet; Take 1 tablet (50 mg total) by mouth daily as needed for migraine.  Dispense: 10 tablet; Refill: 2  2. OSA on CPAP  She needs to resume wearing CPAP   3. B12 deficiency  Continue supplementation and recheck next visit in person   4. Metabolic syndrome  Doing well   5. Dyslipidemia   6. Essential hypertension  - valsartan-hydrochlorothiazide (DIOVAN HCT) 160-12.5 MG tablet; Take 1 tablet by mouth daily.  Dispense: 30 tablet; Refill: 0  7. Primary osteoarthritis of left knee  - diclofenac sodium (VOLTAREN) 1 % GEL; Apply 4 g topically 4 (four) times daily.  Dispense: 300 g; Refill: 0  I discussed the assessment and treatment plan with the patient. The patient was provided an opportunity to ask questions and all were answered. The patient agreed with the plan and demonstrated an understanding of the instructions.   The patient was advised to call back or seek an in-person evaluation if the symptoms worsen or if the condition fails to improve as anticipated.  I provided 25 minutes of non-face-to-face time during this encounter.  Loistine Chance, MD

## 2019-04-17 ENCOUNTER — Other Ambulatory Visit: Payer: Self-pay | Admitting: Family Medicine

## 2019-04-17 DIAGNOSIS — R1013 Epigastric pain: Secondary | ICD-10-CM

## 2019-04-17 NOTE — Telephone Encounter (Signed)
Sent patient a message to inform patient Dr. Bonna Gains is out of the office and will review test when she returns

## 2019-04-20 NOTE — Telephone Encounter (Signed)
Please advised  

## 2019-04-20 NOTE — Telephone Encounter (Addendum)
Called patient back and she stated that her PCP stated that there was a promblem with something opening and closing on the test. PCP told her she did not understand the report but to contact the GI office. Informed patient that I dont read the results but Dr. Vicente Males reviewed the results and he stated the results were normal. I informed patient when Dr. Bonna Gains returns on 05/04/19 she could review them also. Patient stated she wanted the test results emailed to her. Asked ginger if this could be done and she said that fine we can email them. Emailed to patient. Patient wanted me to e-mail it to her [place] e-mail but I did not because the e-mail in patient chart is [email or web address]. Emailed the results secure to patient to this e-mail address. Dr. Vicente Males said he could see her before Dr. Bonna Gains comes back if she wants to.

## 2019-04-20 NOTE — Telephone Encounter (Signed)
Per Dr. Lelon Frohlich the test is normal. If patient is still having symptoms she need to be seen when Dr. Bonna Gains returns to the office

## 2019-05-18 ENCOUNTER — Telehealth: Payer: Self-pay | Admitting: Family Medicine

## 2019-05-18 DIAGNOSIS — I10 Essential (primary) hypertension: Secondary | ICD-10-CM

## 2019-05-19 NOTE — Telephone Encounter (Signed)
lvm to inform pt of refill at pharmacy

## 2019-05-20 ENCOUNTER — Encounter: Payer: Self-pay | Admitting: Gastroenterology

## 2019-05-20 ENCOUNTER — Ambulatory Visit: Payer: 59 | Admitting: Gastroenterology

## 2019-06-22 ENCOUNTER — Encounter: Payer: Self-pay | Admitting: Family Medicine

## 2019-06-22 ENCOUNTER — Other Ambulatory Visit: Payer: Self-pay | Admitting: Family Medicine

## 2019-06-22 DIAGNOSIS — I10 Essential (primary) hypertension: Secondary | ICD-10-CM

## 2019-06-22 MED ORDER — VALSARTAN-HYDROCHLOROTHIAZIDE 160-12.5 MG PO TABS: 1.0000 | ORAL_TABLET | Freq: Every day | ORAL | 0 refills | Status: DC

## 2019-07-02 DIAGNOSIS — M2242 Chondromalacia patellae, left knee: Secondary | ICD-10-CM | POA: Diagnosis not present

## 2019-07-02 DIAGNOSIS — M2241 Chondromalacia patellae, right knee: Secondary | ICD-10-CM | POA: Diagnosis not present

## 2019-07-02 DIAGNOSIS — M5416 Radiculopathy, lumbar region: Secondary | ICD-10-CM | POA: Diagnosis not present

## 2019-07-10 ENCOUNTER — Other Ambulatory Visit: Payer: Self-pay | Admitting: Orthopedic Surgery

## 2019-07-10 DIAGNOSIS — M5416 Radiculopathy, lumbar region: Secondary | ICD-10-CM

## 2019-07-22 ENCOUNTER — Other Ambulatory Visit: Payer: Self-pay

## 2019-07-22 ENCOUNTER — Ambulatory Visit
Admission: RE | Admit: 2019-07-22 | Discharge: 2019-07-22 | Disposition: A | Discharge status: Home / Self Care | Payer: 59 | Source: Ambulatory Visit | Attending: Orthopedic Surgery | Admitting: Orthopedic Surgery

## 2019-07-22 DIAGNOSIS — M5126 Other intervertebral disc displacement, lumbar region: Secondary | ICD-10-CM | POA: Diagnosis not present

## 2019-07-22 DIAGNOSIS — M5416 Radiculopathy, lumbar region: Secondary | ICD-10-CM | POA: Insufficient documentation

## 2019-07-27 ENCOUNTER — Other Ambulatory Visit: Payer: Self-pay | Admitting: Family Medicine

## 2019-07-27 DIAGNOSIS — I10 Essential (primary) hypertension: Secondary | ICD-10-CM

## 2019-07-27 DIAGNOSIS — R1013 Epigastric pain: Secondary | ICD-10-CM

## 2019-07-27 MED ORDER — VALSARTAN-HYDROCHLOROTHIAZIDE 160-12.5 MG PO TABS: 1.0000 | ORAL_TABLET | Freq: Every day | ORAL | 0 refills | Status: DC

## 2019-07-27 MED ORDER — OMEPRAZOLE 40 MG PO CPDR: 40.0000 mg | DELAYED_RELEASE_CAPSULE | Freq: Two times a day (BID) | ORAL | 0 refills | Status: DC

## 2019-09-17 ENCOUNTER — Encounter: Payer: Self-pay | Admitting: Family Medicine

## 2019-09-18 ENCOUNTER — Encounter: Payer: Self-pay | Admitting: Family Medicine

## 2019-09-18 ENCOUNTER — Ambulatory Visit (INDEPENDENT_AMBULATORY_CARE_PROVIDER_SITE_OTHER): Payer: 59 | Admitting: Family Medicine

## 2019-09-18 VITALS — BP 129/84

## 2019-09-18 DIAGNOSIS — M5136 Other intervertebral disc degeneration, lumbar region: Secondary | ICD-10-CM

## 2019-09-18 DIAGNOSIS — Z862 Personal history of diseases of the blood and blood-forming organs and certain disorders involving the immune mechanism: Secondary | ICD-10-CM

## 2019-09-18 DIAGNOSIS — M1712 Unilateral primary osteoarthritis, left knee: Secondary | ICD-10-CM

## 2019-09-18 DIAGNOSIS — I10 Essential (primary) hypertension: Secondary | ICD-10-CM | POA: Diagnosis not present

## 2019-09-18 DIAGNOSIS — G4733 Obstructive sleep apnea (adult) (pediatric): Secondary | ICD-10-CM | POA: Diagnosis not present

## 2019-09-18 DIAGNOSIS — R1013 Epigastric pain: Secondary | ICD-10-CM

## 2019-09-18 DIAGNOSIS — G43809 Other migraine, not intractable, without status migrainosus: Secondary | ICD-10-CM

## 2019-09-18 DIAGNOSIS — E785 Hyperlipidemia, unspecified: Secondary | ICD-10-CM | POA: Diagnosis not present

## 2019-09-18 DIAGNOSIS — K219 Gastro-esophageal reflux disease without esophagitis: Secondary | ICD-10-CM

## 2019-09-18 DIAGNOSIS — E538 Deficiency of other specified B group vitamins: Secondary | ICD-10-CM

## 2019-09-18 DIAGNOSIS — Z9989 Dependence on other enabling machines and devices: Secondary | ICD-10-CM

## 2019-09-18 DIAGNOSIS — E8881 Metabolic syndrome: Secondary | ICD-10-CM | POA: Diagnosis not present

## 2019-09-18 MED ORDER — OMEPRAZOLE 40 MG PO CPDR: 40.0000 mg | DELAYED_RELEASE_CAPSULE | Freq: Two times a day (BID) | ORAL | 1 refills | Status: DC

## 2019-09-18 MED ORDER — UBRELVY 50 MG PO TABS: 1.0000 | ORAL_TABLET | Freq: Every day | ORAL | 2 refills | Status: AC | PRN

## 2019-09-18 MED ORDER — VALSARTAN-HYDROCHLOROTHIAZIDE 160-12.5 MG PO TABS: 1.0000 | ORAL_TABLET | Freq: Every day | ORAL | 1 refills | Status: DC

## 2019-09-18 NOTE — Progress Notes (Signed)
Name: Marie Vaughan   MRN: [id number]    DOB: [birth year]   Date:09/18/2019       Progress Note  Subjective  Chief Complaint  Chief Complaint  Patient presents with  . Hypertension    123/82-129/84 self check range.    I connected with  Marie Vaughan  on 09/18/19 at 10:00 AM EST by telephone, and verified that I am speaking with the correct person using two identifiers.  I discussed the limitations of evaluation and management by telemedicine and the availability of in person appointments. The patient expressed understanding and agreed to proceed. Staff also discussed with the patient that there may be a patient responsible charge related to this service. Patient Location: at work  Provider Location: [facility]    HPI   HTN: she has been compliant with medication, taking Valsartan hctz sine Summer of 2020 and is doing well, no chest pain, no palpitation or dizziness.   Migraine/vestibular: Shehas not been taking Topamax - she did not like sedation side effect  , she lost to follow up with Dr. Manuella Vaughan. Given Imitrex but gets very groggy on it, discussed other medications and she is willing to take it . She continues to have episodes of dizziness. She has one or two episodes monthly, sending Ubrelvy to pharmacy   OSA: moderate to severe based on sleep study, started on CPAP at 9 cm H2O July 2017,she has not been wearing her CPAP machine, explained that may be the cause of increase fatigue   History of iron deficiency anemia: history of iron infusion, she states she feels like her iron level is dropping, taking ferrous sulfate, stopped infusions because of COVID-19 . She has noticed increase in fatigue lately   Metabolic syndrome: she denies polyphagia, but has polydipsia, she has episodes of polyuria ( usually when taking HCTZ) , reviewed labs done last Summer   Obesity:  She states her weight has been stable. She took medication in the past and helped her lose but unable  to take phentermine because of hypertension. She changed her diet, using air fryer but is upset because she is gaining weight again.   B12 Deficiency: she is not very compliant with supplementation, we will recheck level . She states paresthesia has improved   GERD: seen by GI, she still has daily symptoms, trying to avoid trigger foods. She had an EGD last year   Left knee pain: she would like to go back to see Dr. Noemi Vaughan, having difficulty walking, knee instability that has caused her to fall before. Marland Kitchen She was diagnosed with OA but does not want injections at this time   Lumbar pain: she states she is in constant pain, sometimes has to use a cane, reviewed MRI ordered by Dr. Noemi Vaughan, she has mild disc bulge and facet arthropathy also small annular fissure  Patient Active Problem List   Diagnosis Date Noted  . History of ovarian cyst 03/05/2018  . Left ovarian cyst 03/05/2018  . Gross hematuria 01/31/2018  . Vaginal bleeding 01/31/2018  . History of hysterectomy 01/31/2018  . Colon cancer screening   . Polyp of sigmoid colon   . Benign neoplasm of transverse colon   . Iron deficiency anemia   . Metabolic syndrome [id number]  . OSA on CPAP 02/03/2016  . 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 Abnormal brain MRI 12/20/2015  . Dizziness 12/13/2015  . Near syncope 12/10/2015  . Atypical chest pain 08/22/2013  . Essential hypertension 08/22/2013  .  Angio-edema   . GERD (gastroesophageal reflux disease)   . Migraine with aura and without status migrainosus   . Acne   . Allergic rhinitis 10/05/2011  . Insomnia 08/30/2011  . Obesity 08/30/2011    Past Surgical History:  Procedure Laterality Date  . ABDOMINAL HYSTERECTOMY  04-25-10   Due to abnormal PAP  . CARDIAC CATHETERIZATION  04/2014   [facility]  . COLONOSCOPY WITH PROPOFOL N/A 07/11/2017   Procedure: COLONOSCOPY WITH PROPOFOL;  Surgeon: [name], MD;  Location: [place];  Service: Endoscopy;  Laterality: N/A;  .  DILATION AND CURETTAGE OF UTERUS    . ESOPHAGOGASTRODUODENOSCOPY (EGD) WITH PROPOFOL N/A 07/11/2017   Procedure: ESOPHAGOGASTRODUODENOSCOPY (EGD) WITH PROPOFOL;  Surgeon: [name], MD;  Location: [place];  Service: Endoscopy;  Laterality: N/A;  diabetic - diet controlled  . GIVENS CAPSULE STUDY N/A 09/23/2017   Procedure: GIVENS CAPSULE STUDY;  Surgeon: Virgel Manifold, MD;  Location: [facility];  Service: Endoscopy;  Laterality: N/A;  . KNEE ARTHROSCOPY Left 06/10/2013   Procedure: LEFT KNEE ARTHROSCOPY KNEE WITH PARTIAL MEDIAL MENISCECTOMY ;  Surgeon: Lorn Junes, MD;  Location: [place];  Service: Orthopedics;  Laterality: Left;  xerofoam, 4x4's, webril, ace wrap, ice wrap  . KNEE ARTHROSCOPY WITH MEDIAL MENISECTOMY Right 06/10/2013   Procedure: RIGHT KNEE ARTHROSCOPY WITH MEDIAL MENISECTOMY;  Surgeon: Lorn Junes, MD;  Location: [place];  Service: Orthopedics;  Laterality: Right;  partial lateral menisectomoy and chondroplasty  . OVARIAN CYST REMOVAL  04-25-10    Family History  Problem Relation Age of Onset  . Early death Mother        Murdered when pt was 22 years old  . Diabetes Mother   . Hypertension Mother   . Gout Mother   . Kidney failure Mother   . Early death Sister 53       due to hemorage  . Alcohol abuse Maternal Uncle   . Alcohol abuse Other   . Arthritis Other   . Heart disease Other   . Other Other        cousin- phlebitis  . Diabetes Maternal Aunt   . Hyperlipidemia Maternal Aunt   . Hypertension Maternal Aunt   . Prostate cancer Maternal Uncle   . Breast cancer Neg Hx     Social History   Tobacco Use  . Smoking status: Never Smoker  . Smokeless tobacco: Never Used  Substance Use Topics  . Alcohol use: No    Alcohol/week: 0.0 standard drinks    Current Outpatient Medications:  .  diclofenac (VOLTAREN) 50 MG EC tablet, Take 50 mg by mouth 2 (two) times daily as needed., Disp: , Rfl:  .   aspirin EC 81 MG tablet, Take 1 tablet (81 mg total) by mouth daily., Disp: 90 tablet, Rfl: 3 .  Cyanocobalamin (B-12) 1000 MCG SUBL, Place 1 tablet under the tongue daily., Disp: 90 each, Rfl: 1 .  diclofenac sodium (VOLTAREN) 1 % GEL, Apply 4 g topically 4 (four) times daily., Disp: 300 g, Rfl: 0 .  ferrous sulfate 324 (65 Fe) MG TBEC, Take by mouth., Disp: , Rfl:  .  levocetirizine (XYZAL) 5 MG tablet, Take 1 tablet (5 mg total) by mouth every evening. (Patient not taking: Reported on 04/15/2019), Disp: 30 tablet, Rfl: 2 .  omeprazole (PRILOSEC) 40 MG capsule, Take 1 capsule (40 mg total) by mouth 2 (two) times daily., Disp: 60 capsule, Rfl: 0 .  SUMAtriptan (IMITREX)  50 MG tablet, Take 1 tablet (50 mg total) by mouth daily as needed for migraine., Disp: 10 tablet, Rfl: 2 .  topiramate (TOPAMAX) 100 MG tablet, Take 1 tablet (100 mg total) by mouth daily as needed., Disp: 180 tablet, Rfl: 0 .  valsartan-hydrochlorothiazide (DIOVAN-HCT) 160-12.5 MG tablet, Take 1 tablet by mouth daily., Disp: 30 tablet, Rfl: 0  Allergies  Allergen Reactions  . Iodinated Diagnostic Agents Shortness Of Breath  . Chocolate     Hives, itching  . Chocolate Flavor Other (See Comments)    Hives, itching  . Iohexol     CP and SOB  . Peanut-Containing Drug Products Swelling    cashews  . Pineapple     I personally reviewed active problem list, medication list, allergies, family history, social history, health maintenance with the patient/caregiver today.   ROS  Constitutional: Negative for fever or weight change.  Respiratory: Negative for cough and shortness of breath.   Cardiovascular: Negative for chest pain or palpitations.  Gastrointestinal: Negative for abdominal pain, no bowel changes.  Musculoskeletal: Negative for gait problem or joint swelling.  Skin: Negative for rash.  Neurological:  positive for intermittent dizziness and headache.  No other specific complaints in a complete review of systems  (except as listed in HPI above).  Objective  Virtual encounter, vitals  Obtained at home  Today's Vitals   09/18/19 1030  BP: 129/84  SpO2: 98%   There is no height or weight on file to calculate BMI.    Physical Exam  Awake, alert and oriented   PHQ2/9: Depression screen [facility] 2/9 09/18/2019 04/15/2019 02/09/2019 01/09/2019 02/11/2017  Decreased Interest 0 0 0 0 0  Down, Depressed, Hopeless 0 0 0 0 0  PHQ - 2 Score 0 0 0 0 0  Altered sleeping 0 1 0 0 -  Tired, decreased energy 0 0 0 0 -  Change in appetite 0 0 0 0 -  Feeling bad or failure about yourself  0 0 0 0 -  Trouble concentrating 0 0 0 0 -  Moving slowly or fidgety/restless 0 0 0 0 -  Suicidal thoughts 0 0 0 0 -  PHQ-9 Score 0 1 0 0 -  Difficult doing work/chores - Not difficult at all - Not difficult at all -   PHQ-2/9 Result is negative.    Fall Risk: Fall Risk  09/18/2019 04/15/2019 02/09/2019 01/09/2019 07/07/2018  Falls in the past year? 0 1 1 1  0  Comment - - - Knee Edema and had surgery on it in the past-will lock up on her -  Number falls in past yr: 0 1 1 0 -  Injury with Fall? 0 0 0 0 -  Risk Factor Category  - - - - -  Risk for fall due to : - - - - -  Risk for fall due to: Comment - - - - -  Follow up - - - - -     Assessment & Plan  1. B12 deficiency  - Vitamin B12; Future  2. Essential hypertension  - valsartan-hydrochlorothiazide (DIOVAN-HCT) 160-12.5 MG tablet; Take 1 tablet by mouth daily.  Dispense: 90 tablet; Refill: 1  3. Vestibular migraine  - Ubrogepant (UBRELVY) 50 MG TABS; Take 1-2 tablets by mouth daily as needed.  Dispense: 10 tablet; Refill: 2  4. Metabolic syndrome   5. OSA on CPAP  Needs to resume CPAP   6. Dyslipidemia  Reviewed last level  7. Primary osteoarthritis of left knee  8. History of iron deficiency anemia  - CBC with Differential/Platelet; Future - Ferritin; Future  9. GERD without esophagitis  Sees GI   10. Dyspepsia  - omeprazole  (PRILOSEC) 40 MG capsule; Take 1 capsule (40 mg total) by mouth 2 (two) times daily.  Dispense: 180 capsule; Refill: 1  11. DDD (degenerative disc disease), lumbar   I discussed the assessment and treatment plan with the patient. The patient was provided an opportunity to ask questions and all were answered. The patient agreed with the plan and demonstrated an understanding of the instructions.  The patient was advised to call back or seek an in-person evaluation if the symptoms worsen or if the condition fails to improve as anticipated.  I provided 25 minutes of non-face-to-face time during this encounter.

## 2019-10-20 ENCOUNTER — Ambulatory Visit: Payer: 59 | Admitting: Family Medicine

## 2019-10-29 ENCOUNTER — Other Ambulatory Visit: Payer: Self-pay

## 2019-10-29 ENCOUNTER — Ambulatory Visit (INDEPENDENT_AMBULATORY_CARE_PROVIDER_SITE_OTHER): Payer: 59 | Admitting: Family Medicine

## 2019-10-29 ENCOUNTER — Encounter: Payer: Self-pay | Admitting: Family Medicine

## 2019-10-29 ENCOUNTER — Other Ambulatory Visit
Admission: RE | Admit: 2019-10-29 | Discharge: 2019-10-29 | Disposition: A | Discharge status: Home / Self Care | Payer: 59 | Source: Ambulatory Visit | Attending: Family Medicine | Admitting: Family Medicine

## 2019-10-29 VITALS — BP 128/76 | HR 100 | Temp 97.6°F | Resp 16 | Ht 62.0 in | Wt 227.9 lb

## 2019-10-29 DIAGNOSIS — Z862 Personal history of diseases of the blood and blood-forming organs and certain disorders involving the immune mechanism: Secondary | ICD-10-CM | POA: Diagnosis not present

## 2019-10-29 DIAGNOSIS — E8881 Metabolic syndrome: Secondary | ICD-10-CM | POA: Diagnosis not present

## 2019-10-29 DIAGNOSIS — G43809 Other migraine, not intractable, without status migrainosus: Secondary | ICD-10-CM

## 2019-10-29 DIAGNOSIS — E538 Deficiency of other specified B group vitamins: Secondary | ICD-10-CM | POA: Diagnosis not present

## 2019-10-29 DIAGNOSIS — Z91041 Radiographic dye allergy status: Secondary | ICD-10-CM

## 2019-10-29 DIAGNOSIS — R0789 Other chest pain: Secondary | ICD-10-CM

## 2019-10-29 DIAGNOSIS — E785 Hyperlipidemia, unspecified: Secondary | ICD-10-CM | POA: Insufficient documentation

## 2019-10-29 DIAGNOSIS — Z23 Encounter for immunization: Secondary | ICD-10-CM

## 2019-10-29 DIAGNOSIS — I10 Essential (primary) hypertension: Secondary | ICD-10-CM | POA: Insufficient documentation

## 2019-10-29 DIAGNOSIS — R0602 Shortness of breath: Secondary | ICD-10-CM

## 2019-10-29 DIAGNOSIS — M47816 Spondylosis without myelopathy or radiculopathy, lumbar region: Secondary | ICD-10-CM

## 2019-10-29 LAB — LIPID PANEL
Cholesterol: 162 mg/dL (ref 0–200)
HDL: 48 mg/dL (ref >40)
LDL Cholesterol: 76 mg/dL (ref 0–99)
Total CHOL/HDL Ratio: 3.4 RATIO
Triglycerides: 191 mg/dL — ABNORMAL HIGH (ref <150)
VLDL: 38 mg/dL (ref 0–40)

## 2019-10-29 LAB — CBC WITH DIFFERENTIAL/PLATELET
Abs Immature Granulocytes: 0.03 10*3/uL (ref 0.00–0.07)
Basophils Absolute: 0 10*3/uL (ref 0.0–0.1)
Basophils Relative: 1 %
Eosinophils Absolute: 0.1 10*3/uL (ref 0.0–0.5)
Eosinophils Relative: 1 %
HCT: 39.7 % (ref 36.0–46.0)
Hemoglobin: 13 g/dL (ref 12.0–15.0)
Immature Granulocytes: 0 %
Lymphocytes Relative: 28 %
Lymphs Abs: 2.5 10*3/uL (ref 0.7–4.0)
MCH: 28.5 pg (ref 26.0–34.0)
MCHC: 32.7 g/dL (ref 30.0–36.0)
MCV: 87.1 fL (ref 80.0–100.0)
Monocytes Absolute: 0.8 10*3/uL (ref 0.1–1.0)
Monocytes Relative: 9 %
Neutro Abs: 5.4 10*3/uL (ref 1.7–7.7)
Neutrophils Relative %: 61 %
Platelets: 304 10*3/uL (ref 150–400)
RBC: 4.56 MIL/uL (ref 3.87–5.11)
RDW: 13.2 % (ref 11.5–15.5)
WBC: 8.8 10*3/uL (ref 4.0–10.5)
nRBC: 0 % (ref 0.0–0.2)

## 2019-10-29 LAB — COMPREHENSIVE METABOLIC PANEL
ALT: 22 U/L (ref 0–44)
AST: 20 U/L (ref 15–41)
Albumin: 4.1 g/dL (ref 3.5–5.0)
Alkaline Phosphatase: 82 U/L (ref 38–126)
Anion gap: 9 (ref 5–15)
BUN: 10 mg/dL (ref 6–20)
CO2: 24 mmol/L (ref 22–32)
Calcium: 9.6 mg/dL (ref 8.9–10.3)
Chloride: 104 mmol/L (ref 98–111)
Creatinine, Ser: 0.64 mg/dL (ref 0.44–1.00)
GFR calc Af Amer: >60 mL/min (ref >60)
GFR calc non Af Amer: >60 mL/min (ref >60)
Glucose, Bld: 100 mg/dL — ABNORMAL HIGH (ref 70–99)
Potassium: 3.4 mmol/L — ABNORMAL LOW (ref 3.5–5.1)
Sodium: 137 mmol/L (ref 135–145)
Total Bilirubin: 0.5 mg/dL (ref 0.3–1.2)
Total Protein: 7.7 g/dL (ref 6.5–8.1)

## 2019-10-29 LAB — VITAMIN B12: Vitamin B-12: 290 pg/mL (ref 180–914)

## 2019-10-29 LAB — FERRITIN: Ferritin: 203 ng/mL (ref 11–307)

## 2019-10-29 MED ORDER — DIPHENHYDRAMINE HCL 50 MG PO TABS: 50.0000 mg | ORAL_TABLET | Freq: Every evening | ORAL | 0 refills | Status: DC | PRN

## 2019-10-29 MED ORDER — PREDNISONE 50 MG PO TABS: ORAL_TABLET | ORAL | 0 refills | Status: DC

## 2019-10-29 NOTE — Progress Notes (Signed)
Name: Marie Vaughan   MRN: [id number]    DOB: 07-14-1971   Date:10/29/2019       Progress Note  Subjective  Chief Complaint  Chief Complaint  Patient presents with  . Back Pain    6/10 back pain. Pain is constant. She has been evaluated and had a CT. She has a small tear. See imaging.   . Sleep Apnea  . Anemia    Wants to get labs checked. She was getting iron infusions. Since Covid she has not had any infusions done. Symptoms are begining to return.    HPI  History of iron deficiency anemia: she used to see Dr. Janese Banks, but lost to follow up. She denies  Pica., but has fatigue and SOB. She has not had iron infusion for almost one year  Atypical Chest pain: she has seen cardiologist, pulmonologist and GI , she states all evaluation was negative. She continues to have some wheezing , sob with activity and chest pain intermittently even when sitting down. She needs to take deep breaths intermittently. Discussed referral back to pulmonologist of CT to rule out PE or lung pathology , she never had a CT chest, .   Chronic lower back pain with radiculitis: seeing Dr. Noemi Chapel and advised to see by Dr. Ron Agee for possible injections but she was afraid to go. She prefers seeing someone locally, we will place referral for Dr. Holley Raring. She has constant pain on lumbar spine that radiates to her abdomen and sometimes down right leg. She states when the pain is so intense she loses her balance.   MRI lumbar spine done 07/22/2019 showed L5-S1 facet arthropathy, small right paracentral annular fissure, also facet diease at other levels and mild disc bulging.    Patient Active Problem List   Diagnosis Date Noted  . History of ovarian cyst 03/05/2018  . Left ovarian cyst 03/05/2018  . Gross hematuria 01/31/2018  . Vaginal bleeding 01/31/2018  . History of hysterectomy 01/31/2018  . Colon cancer screening   . Polyp of sigmoid colon   . Benign neoplasm of transverse colon   . Iron deficiency anemia   .  Metabolic syndrome [id number]  . OSA on CPAP 02/03/2016  . 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 Abnormal brain MRI 12/20/2015  . Dizziness 12/13/2015  . Near syncope 12/10/2015  . Atypical chest pain 08/22/2013  . Essential hypertension 08/22/2013  . Angio-edema   . GERD (gastroesophageal reflux disease)   . Migraine with aura and without status migrainosus   . Acne   . Allergic rhinitis 10/05/2011  . Insomnia 08/30/2011  . Obesity 08/30/2011    Past Surgical History:  Procedure Laterality Date  . ABDOMINAL HYSTERECTOMY  04-25-10   Due to abnormal PAP  . CARDIAC CATHETERIZATION  04/2014   [facility]  . COLONOSCOPY WITH PROPOFOL N/A 07/11/2017   Procedure: COLONOSCOPY WITH PROPOFOL;  Surgeon: [name], MD;  Location: [place];  Service: Endoscopy;  Laterality: N/A;  . DILATION AND CURETTAGE OF UTERUS    . ESOPHAGOGASTRODUODENOSCOPY (EGD) WITH PROPOFOL N/A 07/11/2017   Procedure: ESOPHAGOGASTRODUODENOSCOPY (EGD) WITH PROPOFOL;  Surgeon: [name], MD;  Location: [place];  Service: Endoscopy;  Laterality: N/A;  diabetic - diet controlled  . GIVENS CAPSULE STUDY N/A 09/23/2017   Procedure: GIVENS CAPSULE STUDY;  Surgeon: Virgel Manifold, MD;  Location: [facility];  Service: Endoscopy;  Laterality: N/A;  . KNEE ARTHROSCOPY Left 06/10/2013   Procedure: LEFT KNEE ARTHROSCOPY KNEE WITH PARTIAL MEDIAL MENISCECTOMY ;  Surgeon: Lorn Junes, MD;  Location: [place];  Service: Orthopedics;  Laterality: Left;  xerofoam, 4x4's, webril, ace wrap, ice wrap  . KNEE ARTHROSCOPY WITH MEDIAL MENISECTOMY Right 06/10/2013   Procedure: RIGHT KNEE ARTHROSCOPY WITH MEDIAL MENISECTOMY;  Surgeon: Lorn Junes, MD;  Location: [place];  Service: Orthopedics;  Laterality: Right;  partial lateral menisectomoy and chondroplasty  . OVARIAN CYST REMOVAL  04-25-10    Family History  Problem Relation Age of Onset  . Early death Mother         Murdered when pt was 49 years old  . Diabetes Mother   . Hypertension Mother   . Gout Mother   . Kidney failure Mother   . Early death Sister 86       due to hemorage  . Alcohol abuse Maternal Uncle   . Alcohol abuse Other   . Arthritis Other   . Heart disease Other   . Other Other        cousin- phlebitis  . Diabetes Maternal Aunt   . Hyperlipidemia Maternal Aunt   . Hypertension Maternal Aunt   . Prostate cancer Maternal Uncle   . Breast cancer Neg Hx     Social History   Tobacco Use  . Smoking status: Never Smoker  . Smokeless tobacco: Never Used  Substance Use Topics  . Alcohol use: No    Alcohol/week: 0.0 standard drinks     Current Outpatient Medications:  .  aspirin EC 81 MG tablet, Take 1 tablet (81 mg total) by mouth daily., Disp: 90 tablet, Rfl: 3 .  Cyanocobalamin (B-12) 1000 MCG SUBL, Place 1 tablet under the tongue daily., Disp: 90 each, Rfl: 1 .  diclofenac sodium (VOLTAREN) 1 % GEL, Apply 4 g topically 4 (four) times daily., Disp: 300 g, Rfl: 0 .  ferrous sulfate 324 (65 Fe) MG TBEC, Take by mouth., Disp: , Rfl:  .  omeprazole (PRILOSEC) 40 MG capsule, Take 1 capsule (40 mg total) by mouth 2 (two) times daily., Disp: 180 capsule, Rfl: 1 .  Ubrogepant (UBRELVY) 50 MG TABS, Take 1-2 tablets by mouth daily as needed., Disp: 10 tablet, Rfl: 2 .  valsartan-hydrochlorothiazide (DIOVAN-HCT) 160-12.5 MG tablet, Take 1 tablet by mouth daily., Disp: 90 tablet, Rfl: 1 .  levocetirizine (XYZAL) 5 MG tablet, Take 1 tablet (5 mg total) by mouth every evening. (Patient not taking: Reported on 10/29/2019), Disp: 30 tablet, Rfl: 2  Allergies  Allergen Reactions  . Iodinated Diagnostic Agents Shortness Of Breath  . Chocolate     Hives, itching  . Chocolate Flavor Other (See Comments)    Hives, itching  . Iohexol     CP and SOB  . Peanut-Containing Drug Products Swelling    cashews  . Pineapple     I personally reviewed active problem list, medication list, allergies,  family history, social history, health maintenance with the patient/caregiver today.   ROS  Constitutional: Negative for fever or weight change.  Respiratory: Negative for cough and shortness of breath.   Cardiovascular: Negative for chest pain or palpitations.  Gastrointestinal: Negative for abdominal pain, no bowel changes.  Musculoskeletal: Negative for gait problem or joint swelling.  Skin: Negative for rash.  Neurological: Negative for dizziness or headache.  No other specific complaints in a complete review of systems (except as listed in HPI above).   Objective  Vitals:   10/29/19 0752  BP: 128/76  Pulse: 100  Resp: 16  Temp: 97.6  F (36.4 C)  TempSrc: Temporal  SpO2: 99%  Weight: 227 lb 14.4 oz (103.4 kg)  Height: 5\' 2"  (1.575 m)    Body mass index is 41.68 kg/m.  Physical Exam  Constitutional: Patient appears well-developed and well-nourished. Obese  No distress.  HEENT: head atraumatic, normocephalic, pupils equal and reactive to light Cardiovascular: Normal rate, regular rhythm and normal heart sounds.  No murmur heard. No BLE edema. Pulmonary/Chest: Effort normal and breath sounds normal. No respiratory distress. Abdominal: Soft.  There is no tenderness. Psychiatric: Patient has a normal mood and affect. behavior is normal. Judgment and thought content normal.   PHQ2/9: Depression screen [facility] 2/9 10/29/2019 09/18/2019 04/15/2019 02/09/2019 01/09/2019  Decreased Interest 0 0 0 0 0  Down, Depressed, Hopeless 0 0 0 0 0  PHQ - 2 Score 0 0 0 0 0  Altered sleeping 0 0 1 0 0  Tired, decreased energy 0 0 0 0 0  Change in appetite 0 0 0 0 0  Feeling bad or failure about yourself  0 0 0 0 0  Trouble concentrating 0 0 0 0 0  Moving slowly or fidgety/restless 0 0 0 0 0  Suicidal thoughts 0 0 0 0 0  PHQ-9 Score 0 0 1 0 0  Difficult doing work/chores - - Not difficult at all - Not difficult at all    phq 9 is negative  Fall Risk: Fall Risk  10/29/2019 09/18/2019  04/15/2019 02/09/2019 01/09/2019  Falls in the past year? 0 0 1 1 1   Comment - - - - Knee Edema and had surgery on it in the past-will lock up on her  Number falls in past yr: 0 0 1 1 0  Injury with Fall? 0 0 0 0 0  Risk Factor Category  - - - - -  Risk for fall due to : - - - - -  Risk for fall due to: Comment - - - - -  Follow up - - - - -     Functional Status Survey: Is the patient deaf or have difficulty hearing?: No Does the patient have difficulty seeing, even when wearing glasses/contacts?: No Does the patient have difficulty concentrating, remembering, or making decisions?: No Does the patient have difficulty walking or climbing stairs?: No Does the patient have difficulty dressing or bathing?: No Does the patient have difficulty doing errands alone such as visiting a doctor's office or shopping?: No    Assessment & Plan  1. B12 deficiency  - Vitamin B12; Future  2. Essential hypertension  - CBC with Differential/Platelet; Future - Comprehensive metabolic panel; Future  3. Vestibular migraine  Responding well to Ubrelvy   4. Metabolic syndrome  - Hemoglobin A1c; Future  5. Dyslipidemia  - Lipid panel; Future  6. Need for Tdap vaccination  - Tdap vaccine greater than or equal to 7yo IM  7. History of iron deficiency anemia  - Ferritin; Future  8. Lumbar facet arthropathy  - Ambulatory referral to Pain Clinic  9. Atypical chest pain  - CT Angio Chest W/Cm &/Or Wo Cm; Future  10. SOB (shortness of breath) on exertion  - CT Angio Chest W/Cm &/Or Wo Cm; Future  11. Allergy to imaging contrast media  - predniSONE (DELTASONE) 50 MG tablet; 13 hours, 7 hours and 1 hour before chest CT  Dispense: 3 tablet; Refill: 0 - diphenhydrAMINE (BENADRYL) 50 MG tablet; Take 1 tablet (50 mg total) by mouth at bedtime as needed for itching. 1  hour before CT chest  Dispense: 1 tablet; Refill: 0

## 2019-10-30 LAB — HEMOGLOBIN A1C
Hgb A1c MFr Bld: 6.2 % — ABNORMAL HIGH (ref 4.8–5.6)
Mean Plasma Glucose: 131 mg/dL

## 2019-11-12 ENCOUNTER — Other Ambulatory Visit: Payer: Self-pay

## 2019-11-12 ENCOUNTER — Ambulatory Visit
Admission: RE | Admit: 2019-11-12 | Discharge: 2019-11-12 | Disposition: A | Discharge status: Home / Self Care | Payer: 59 | Source: Ambulatory Visit | Attending: Family Medicine | Admitting: Family Medicine

## 2019-11-12 DIAGNOSIS — R0789 Other chest pain: Secondary | ICD-10-CM | POA: Insufficient documentation

## 2019-11-12 DIAGNOSIS — R0602 Shortness of breath: Secondary | ICD-10-CM | POA: Diagnosis not present

## 2019-11-12 MED ORDER — IOHEXOL 350 MG/ML SOLN
75.0000 mL | Freq: Once | INTRAVENOUS | Status: AC | PRN
  Administered 2019-11-12: 75 mL via INTRAVENOUS

## 2019-12-08 ENCOUNTER — Other Ambulatory Visit: Payer: Self-pay

## 2019-12-08 ENCOUNTER — Ambulatory Visit
Payer: 59 | Attending: Student in an Organized Health Care Education/Training Program | Admitting: Student in an Organized Health Care Education/Training Program

## 2019-12-08 ENCOUNTER — Encounter: Payer: Self-pay | Admitting: Student in an Organized Health Care Education/Training Program

## 2019-12-08 VITALS — BP 123/85 | HR 98 | Temp 98.1°F | Resp 16 | Ht 62.0 in | Wt 224.0 lb

## 2019-12-08 DIAGNOSIS — G894 Chronic pain syndrome: Secondary | ICD-10-CM | POA: Insufficient documentation

## 2019-12-08 DIAGNOSIS — M47816 Spondylosis without myelopathy or radiculopathy, lumbar region: Secondary | ICD-10-CM | POA: Insufficient documentation

## 2019-12-08 MED ORDER — TIZANIDINE HCL 4 MG PO TABS: 4.0000 mg | ORAL_TABLET | Freq: Every day | ORAL | 1 refills | Status: DC | PRN

## 2019-12-08 MED ORDER — GABAPENTIN 300 MG PO CAPS: 300.0000 mg | ORAL_CAPSULE | Freq: Every day | ORAL | 1 refills | Status: DC

## 2019-12-08 NOTE — Progress Notes (Signed)
Safety precautions to be maintained throughout the outpatient stay will include: orient to surroundings, keep bed in low position, maintain call bell within reach at all times, provide assistance with transfer out of bed and ambulation.  

## 2019-12-08 NOTE — Progress Notes (Signed)
Patient: Marie Vaughan  Service Category: E/M  Provider: Gillis Santa, MD  DOB: Mar 06, 1971  DOS: 12/08/2019  Referring Provider: Steele Sizer, MD  MRN: [id number]  Setting: Ambulatory outpatient  PCP: Steele Sizer, MD  Type: New Patient  Specialty: Interventional Pain Management    Location: Office  Delivery: Face-to-face     Primary Reason(s) for Visit: Encounter for initial evaluation of one or more chronic problems (new to examiner) potentially causing chronic pain, and posing a threat to normal musculoskeletal function. (Level of risk: High) CC: Back Pain (lower) and Knee Pain (bilateral)  HPI  Ms. Crager is a 49 y.o. year old, female patient, who comes today to see [place] for the first time for an initial evaluation of her chronic pain. She has Insomnia; Obesity; Allergic rhinitis; Angio-edema; GERD (gastroesophageal reflux disease); Migraine with aura and without status migrainosus; Acne; Atypical chest pain; Essential hypertension; Near syncope; Dizziness; Abnormal brain MRI; Vestibular migraine; OSA on CPAP; Metabolic syndrome; Colon cancer screening; Polyp of sigmoid colon; Benign neoplasm of transverse colon; Iron deficiency anemia; History of hysterectomy; History of ovarian cyst; Left ovarian cyst; Lumbar spondylosis; and Chronic pain syndrome on their problem list. Today she comes in for evaluation of her Back Pain (lower) and Knee Pain (bilateral)  Pain Assessment: Location: Lower Back Radiating: radiates around to right groin, radiates down side of leg to ankle Onset: More than a month ago Duration: Chronic pain Quality: Aching, Discomfort, Constant Severity: 10-Worst pain ever/10 (subjective, self-reported pain score)  Note: Reported level is inconsistent with clinical observations.                         When using our objective Pain Scale, levels between 6 and 10/10 are said to belong in an emergency room, as it progressively worsens from a 6/10, described as severely limiting,  requiring emergency care not usually available at an outpatient pain management facility. At a 6/10 level, communication becomes difficult and requires great effort. Assistance to reach the emergency department may be required. Facial flushing and profuse sweating along with potentially dangerous increases in heart rate and blood pressure will be evident. Effect on ADL: prolonged sitting, standing Timing: Constant Modifying factors: heat, streching,standing BP: 123/85  HR: 98  Onset and Duration: Present longer than 3 months Cause of pain: Unknown Severity: Getting worse, NAS-11 at its worse: 10/10, NAS-11 at its best: 4/10, NAS-11 now: 10/10 and NAS-11 on the average: 8/10 Timing: Not influenced by the time of the day and After a period of immobility Aggravating Factors: Bending and Prolonged sitting Alleviating Factors: Hot packs, Standing, Using a brace and Warm showers or baths Associated Problems: Vomiting  and Pain that does not allow patient to sleep Quality of Pain: Sharp, Shooting and Stabbing Previous Examinations or Tests: CT scan, Endoscopy and MRI scan Previous Treatments: The patient denies any previous treatments  The patient comes into the clinics today for the first time for a chronic pain management evaluation.   Patient is a very pleasant 49 year old female who presents with a chief complaint of low back pain primarily.  She occasionally does have radiation in a posterior lateral distribution down to her distal calf and ankle.  However she states that the majority of her pain is in her low back.  Of note she has a history of a knee arthroscopy for meniscus injury that she sustained around 2015.  She had a lumbar MRI performed below which shows lumbar facet arthropathy and  lumbar spondylosis most pronounced at L4-L5 L5-S1.  She has done physical therapy in the past but it was many years ago.  She has tried to do some home PT regimens but states that her low back pain was a  limiting factor.  Her pain is worse with lumbar extension and gets better with forward flexion.  She denies having tried any muscle relaxers.   Historic Controlled Substance Pharmacotherapy Review   Historical Monitoring: The patient  reports no history of drug use. List of all UDS Test(s): No results found for: MDMA, COCAINSCRNUR, [place], [place], CANNABQUANT, THCU, [place] List of other Serum/Urine Drug Screening Test(s):  No results found for: AMPHSCRSER, BARBSCRSER, BENZOSCRSER, COCAINSCRSER, COCAINSCRNUR, PCPSCRSER, PCPQUANT, THCSCRSER, THCU, CANNABQUANT, OPIATESCRSER, OXYSCRSER, PROPOXSCRSER, ETH Historical Background Evaluation: [place] PMP: PDMP reviewed during this encounter. Six (6) year initial data search conducted.             [place] Department of public safety, offender search: Editor, commissioning Information) Non-contributory Risk Assessment Profile: Aberrant behavior: None observed or detected today Risk factors for fatal opioid overdose: None identified today Fatal overdose hazard ratio (HR): Calculation deferred Non-fatal overdose hazard ratio (HR): Calculation deferred Risk of opioid abuse or dependence: 0.7-3.0% with doses ? 36 MME/day and 6.1-26% with doses ? 120 MME/day. Substance use disorder (SUD) risk level: See below Personal History of Substance Abuse (SUD-Substance use disorder):  Alcohol: Negative  Illegal Drugs: Negative  Rx Drugs: Negative  ORT Risk Level calculation: Low Risk Opioid Risk Tool - 12/08/19 1314      Family History of Substance Abuse   Alcohol  Negative    Illegal Drugs  Negative    Rx Drugs  Negative      Personal History of Substance Abuse   Alcohol  Negative    Illegal Drugs  Negative    Rx Drugs  Negative      Age   Age between 66-45 years   No      History of Preadolescent Sexual Abuse   History of Preadolescent Sexual Abuse  Negative or Female      Psychological Disease   Psychological Disease  Negative    Depression  Negative      Total  Score   Opioid Risk Tool Scoring  0    Opioid Risk Interpretation  Low Risk      ORT Scoring interpretation table:  Score <3 = Low Risk for SUD  Score between 4-7 = Moderate Risk for SUD  Score >8 = High Risk for Opioid Abuse   PHQ-2 Depression Scale:  Total score: 0  PHQ-2 Scoring interpretation table: (Score and probability of major depressive disorder)  Score 0 = No depression  Score 1 = 15.4% Probability  Score 2 = 21.1% Probability  Score 3 = 38.4% Probability  Score 4 = 45.5% Probability  Score 5 = 56.4% Probability  Score 6 = 78.6% Probability   PHQ-9 Depression Scale:  Total score: 4  PHQ-9 Scoring interpretation table:  Score 0-4 = No depression  Score 5-9 = Mild depression  Score 10-14 = Moderate depression  Score 15-19 = Moderately severe depression  Score 20-27 = Severe depression (2.4 times higher risk of SUD and 2.89 times higher risk of overuse)   Pharmacologic Plan: As per protocol, I have not taken over any controlled substance management, pending the results of ordered tests and/or consults.            Initial impression: Pending review of available data and ordered tests.  Meds  Current Outpatient Medications:  .  aspirin EC 81 MG tablet, Take 1 tablet (81 mg total) by mouth daily., Disp: 90 tablet, Rfl: 3 .  Cyanocobalamin (B-12) 1000 MCG SUBL, Place 1 tablet under the tongue daily., Disp: 90 each, Rfl: 1 .  diclofenac sodium (VOLTAREN) 1 % GEL, Apply 4 g topically 4 (four) times daily., Disp: 300 g, Rfl: 0 .  omeprazole (PRILOSEC) 40 MG capsule, Take 1 capsule (40 mg total) by mouth 2 (two) times daily., Disp: 180 capsule, Rfl: 1 .  Ubrogepant (UBRELVY) 50 MG TABS, Take 1-2 tablets by mouth daily as needed., Disp: 10 tablet, Rfl: 2 .  valsartan-hydrochlorothiazide (DIOVAN-HCT) 160-12.5 MG tablet, Take 1 tablet by mouth daily., Disp: 90 tablet, Rfl: 1 .  diphenhydrAMINE (BENADRYL) 50 MG tablet, Take 1 tablet (50 mg total) by mouth at bedtime as needed  for itching. 1 hour before CT chest, Disp: 1 tablet, Rfl: 0 .  ferrous sulfate 324 (65 Fe) MG TBEC, Take by mouth., Disp: , Rfl:  .  gabapentin (NEURONTIN) 300 MG capsule, Take 1 capsule (300 mg total) by mouth at bedtime., Disp: 30 capsule, Rfl: 1 .  levocetirizine (XYZAL) 5 MG tablet, Take 1 tablet (5 mg total) by mouth every evening. (Patient not taking: Reported on 10/29/2019), Disp: 30 tablet, Rfl: 2 .  predniSONE (DELTASONE) 50 MG tablet, 13 hours, 7 hours and 1 hour before chest CT, Disp: 3 tablet, Rfl: 0 .  tiZANidine (ZANAFLEX) 4 MG tablet, Take 1 tablet (4 mg total) by mouth daily as needed for muscle spasms., Disp: 30 tablet, Rfl: 1  Imaging Review    Lumbosacral Imaging: Lumbar MR wo contrast:  Results for orders placed during the hospital encounter of 07/22/19  MR LUMBAR SPINE WO CONTRAST   Narrative CLINICAL DATA:  LBP with shooting pain down RIGHT leg to the foot with numbness in both feet.  EXAM: MRI LUMBAR SPINE WITHOUT CONTRAST  TECHNIQUE: Multiplanar, multisequence MR imaging of the lumbar spine was performed. No intravenous contrast was administered.  COMPARISON:  None.  FINDINGS: Segmentation:  Standard.  Alignment:  Physiologic.  Vertebrae: No fracture, evidence of discitis, or bone lesion. Mild osteoarthritis of bilateral SI joints.  Conus medullaris and cauda equina: Conus extends to the L1 level. Conus and cauda equina appear normal.  Paraspinal and other soft tissues: No acute paraspinal abnormality.  Disc levels:  Disc spaces: Disc spaces are relatively well maintained.  T12-L1: No significant disc bulge. No evidence of neural foraminal stenosis. No central canal stenosis.  L1-L2: Mild broad-based disc bulge. No evidence of neural foraminal stenosis. No central canal stenosis.  L2-L3: No significant disc bulge. No evidence of neural foraminal stenosis. No central canal stenosis.  L3-L4: Mild broad-based disc bulge. No evidence of neural  foraminal stenosis. No central canal stenosis.  L4-L5: Mild broad-based disc bulge. Mild bilateral facet arthropathy. No evidence of neural foraminal stenosis. No central canal stenosis.  L5-S1: Small right paracentral annular fissure. Moderate bilateral facet arthropathy. No evidence of neural foraminal stenosis. No central canal stenosis.  IMPRESSION: 1. Mild lumbar spine spondylosis as described above. 2.  No acute osseous injury of the lumbar spine.   Electronically Signed   By: Kathreen Devoid   On: 07/23/2019 09:01    Knee-L MR w contrast:  Results for orders placed during the hospital encounter of 01/29/18  MR KNEE LEFT WO CONTRAST   Narrative CLINICAL DATA:  Peripheral left knee pain and swelling with popping sensation. Fall 1 month ago.  Prior knee surgery in 2013.  EXAM: MRI OF THE LEFT KNEE WITHOUT CONTRAST  TECHNIQUE: Multiplanar, multisequence MR imaging of the knee was performed. No intravenous contrast was administered.  COMPARISON:  04/18/2012; knee radiographs from 01/29/2018  FINDINGS: MENISCI  Medial meniscus:  Unremarkable  Lateral meniscus:  Unremarkable  LIGAMENTS  Cruciates:  Unremarkable  Collaterals:  Unremarkable  CARTILAGE  Patellofemoral: Moderate degenerative chondral thinning in the femoral trochlear groove and along the medial patellar facet.  Medial: Mild to moderate degenerative chondral thinning, with focal partial-thickness chondral thinning laterally along the medial femoral condyle for example on image 16/5. Small degenerative subcortical focus of edema signal along the medial tibial plateau on image 18/6.  Lateral:  Mild degenerative chondral thinning.  Joint: Trace knee effusion especially in the vicinity of the patellofemoral joint.  Popliteal Fossa:  Mild pes anserine bursitis.  Extensor Mechanism:  Prepatellar subcutaneous edema.  Bones:  Mild spurring along the intercondylar notch.  Other: No supplemental  non-categorized findings.  IMPRESSION: 1. Mild to moderate degrees of degenerative chondral thinning in the knee joint. Focal partial-thickness chondral thinning laterally along the medial femoral condyle. 2. Trace knee effusion. 3. Mild pes anserine bursitis. 4. Prepatellar subcutaneous edema.   Electronically Signed   By: Van Clines M.D.   On: 01/30/2018 07:25    Results for orders placed during the hospital encounter of 01/29/18  DG Knee 3 Views Right   Narrative CLINICAL DATA:  Bilateral knee pain, left greater than right. Prior knee surgery.  EXAM: RIGHT KNEE - 3 VIEW  COMPARISON:  12/06/2015  FINDINGS: Mild medial compartmental articular space narrowing. Mild marginal spurring along the patella, and mild spurring of the tibial spine. Focal sclerosis projecting over the central tibial plateau only seen on the frontal projection, unchanged, thought to be benign and incidental. No free fragment is identified.  IMPRESSION: 1. Mild medial compartmental articular space narrowing with minimal spurring.   Electronically Signed   By: Van Clines M.D.   On: 01/30/2018 07:29    Knee-L DG 3 views:  Results for orders placed during the hospital encounter of 01/29/18  DG Knee 3 Views Left   Narrative CLINICAL DATA:  Left greater than right knee pain.  EXAM: LEFT KNEE - 3 VIEW  COMPARISON:  12/06/2015  FINDINGS: Equivocal medial compartmental narrowing. Tibial spine spurring. Minimal marginal spurring along the patella. Preserved patellofemoral articular space. No acute bony findings.  IMPRESSION: 1. Equivocal medial compartmental narrowing.  Minimal spurring.   Electronically Signed   By: Van Clines M.D.   On: 01/30/2018 07:27    Knee-R DG 4 views:  Results for orders placed during the hospital encounter of 12/06/15  DG Knee Complete 4 Views Right   Narrative CLINICAL DATA:  Worsening right knee instability; arthroscopic surgery in  2014. The patient reports swelling numbness and instability  EXAM: RIGHT KNEE - COMPLETE 4+ VIEW; BILATERAL KNEES STANDING - 1 VIEW; LEFT KNEE - COMPLETE 4+ VIEW<Procedure Description>RIGHT KNEE - COMPLETE 4+ VIEW; BILATERAL KNEES STANDING - 1 VIEW; LEFT KNEE - COMPLETE 4+ VIEW  COMPARISON:  Left knee series of January 23, 2012.  FINDINGS: Left knee: There is mild narrowing of the medial joint compartment. There is beaking of the tibial spines. The patellofemoral joint is grossly normal. Tiny spurs arise from the superior and inferior articular margins of the patella. There is no acute or old fracture nor dislocation. There is no joint effusion or chondrocalcinosis.  Right knee: The bones are adequately mineralized. There  is very mild narrowing of the medial joint compartment. There is beaking of the tibial spines. There is no acute fracture nor dislocation. There are small spurs arising from the superior articular margins of the patella. There is no joint effusion or chondrocalcinosis.  IMPRESSION: There are mild bony degenerative changes of both knees centered on the medial and patellofemoral compartments. The left knee appears slightly more involved than the right. There is no acute bony abnormality.   Electronically Signed   By: David  Martinique M.D.   On: 12/07/2015 08:34    Knee-L DG 4 views:  Results for orders placed during the hospital encounter of 12/06/15  DG Knee Complete 4 Views Left   Narrative CLINICAL DATA:  Worsening right knee instability; arthroscopic surgery in 2014. The patient reports swelling numbness and instability  EXAM: RIGHT KNEE - COMPLETE 4+ VIEW; BILATERAL KNEES STANDING - 1 VIEW; LEFT KNEE - COMPLETE 4+ VIEW<Procedure Description>RIGHT KNEE - COMPLETE 4+ VIEW; BILATERAL KNEES STANDING - 1 VIEW; LEFT KNEE - COMPLETE 4+ VIEW  COMPARISON:  Left knee series of January 23, 2012.  FINDINGS: Left knee: There is mild narrowing of the medial joint  compartment. There is beaking of the tibial spines. The patellofemoral joint is grossly normal. Tiny spurs arise from the superior and inferior articular margins of the patella. There is no acute or old fracture nor dislocation. There is no joint effusion or chondrocalcinosis.  Right knee: The bones are adequately mineralized. There is very mild narrowing of the medial joint compartment. There is beaking of the tibial spines. There is no acute fracture nor dislocation. There are small spurs arising from the superior articular margins of the patella. There is no joint effusion or chondrocalcinosis.  IMPRESSION: There are mild bony degenerative changes of both knees centered on the medial and patellofemoral compartments. The left knee appears slightly more involved than the right. There is no acute bony abnormality.   Electronically Signed   By: David  Martinique M.D.   On: 12/07/2015 08:34     Complexity Note: Imaging results reviewed. Results shared with Ms. Owens Shark, using Layman's terms.                         ROS  Cardiovascular: Chest pain Pulmonary or Respiratory: Temporary stoppage of breathing during sleep Neurological: No reported neurological signs or symptoms such as seizures, abnormal skin sensations, urinary and/or fecal incontinence, being born with an abnormal open spine and/or a tethered spinal cord Psychological-Psychiatric: No reported psychological or psychiatric signs or symptoms such as difficulty sleeping, anxiety, depression, delusions or hallucinations (schizophrenial), mood swings (bipolar disorders) or suicidal ideations or attempts Gastrointestinal: Reflux or heatburn Genitourinary: No reported renal or genitourinary signs or symptoms such as difficulty voiding or producing urine, peeing blood, non-functioning kidney, kidney stones, difficulty emptying the bladder, difficulty controlling the flow of urine, or chronic kidney disease Hematological: No reported  hematological signs or symptoms such as prolonged bleeding, low or poor functioning platelets, bruising or bleeding easily, hereditary bleeding problems, low energy levels due to low hemoglobin or being anemic Endocrine: No reported endocrine signs or symptoms such as high or low blood sugar, rapid heart rate due to high thyroid levels, obesity or weight gain due to slow thyroid or thyroid disease Rheumatologic: No reported rheumatological signs and symptoms such as fatigue, joint pain, tenderness, swelling, redness, heat, stiffness, decreased range of motion, with or without associated rash Musculoskeletal: Negative for myasthenia gravis, muscular dystrophy, multiple sclerosis or  malignant hyperthermia Work History: Working full time  Allergies  Ms. Dann is allergic to iodinated diagnostic agents; chocolate; chocolate flavor; iohexol; peanut-containing drug products; and pineapple.  Laboratory Chemistry Profile   Renal Lab Results  Component Value Date   BUN 10 10/29/2019   CREATININE 0.64 10/29/2019   BCR 13 12/07/2015   GFRAA >60 10/29/2019   GFRNONAA >60 10/29/2019   SPECGRAV 1.010 03/05/2018   PHUR 5.0 03/05/2018   PROTEINUR Negative 03/05/2018     Electrolytes Lab Results  Component Value Date   NA 137 10/29/2019   K 3.4 (L) 10/29/2019   CL 104 10/29/2019   CALCIUM 9.6 10/29/2019   MG 2.1 06/28/2017     Hepatic Lab Results  Component Value Date   AST 20 10/29/2019   ALT 22 10/29/2019   ALBUMIN 4.1 10/29/2019   ALKPHOS 82 10/29/2019   LIPASE 35 04/22/2015     ID Lab Results  Component Value Date   PREGTESTUR NEGATIVE 12/10/2015     Bone No results found for: VD25OH, FB510CH8NID, PO2423NT6, RW4315QM0, 25OHVITD1, 25OHVITD2, 25OHVITD3, TESTOFREE, TESTOSTERONE   Endocrine Lab Results  Component Value Date   GLUCOSE 100 (H) 10/29/2019   GLUCOSEU NEGATIVE 01/02/2018   HGBA1C 6.2 (H) 10/29/2019   TSH 0.972 01/23/2019     Neuropathy Lab Results  Component  Value Date   VITAMINB12 290 10/29/2019   FOLATE 8.0 07/19/2017   HGBA1C 6.2 (H) 10/29/2019     CNS Lab Results  Component Value Date   COLORCSF COLORLESS 12/13/2015   APPEARCSF CLEAR (A) 12/13/2015   RBCCOUNTCSF 166 (H) 12/13/2015   WBCCSF 17 12/13/2015   POLYSCSF 0 12/13/2015   LYMPHSCSF 91 12/13/2015   EOSCSF 0 12/13/2015   PROTEINCSF 36 12/13/2015   GLUCCSF 60 12/13/2015   CSFOLI Comment 12/13/2015   IGGCSF 2.7 12/13/2015     Inflammation (CRP: Acute  ESR: Chronic) Lab Results  Component Value Date   CRP 0.8 12/12/2015   ESRSEDRATE 41 (H) 12/12/2015   LATICACIDVEN 0.9 12/10/2015     Rheumatology No results found for: RF, ANA, LABURIC, URICUR, LYMEIGGIGMAB, LYMEABIGMQN, HLAB27   Coagulation Lab Results  Component Value Date   INR 1.07 12/12/2015   LABPROT 14.1 12/12/2015   APTT 30 12/12/2015   PLT 304 10/29/2019     Cardiovascular Lab Results  Component Value Date   BNP 6.0 12/10/2015   TROPONINI <0.03 06/17/2017   HGB 13.0 10/29/2019   HCT 39.7 10/29/2019     Screening Lab Results  Component Value Date   PREGTESTUR NEGATIVE 12/10/2015     Cancer No results found for: CEA, CA125, LABCA2   Allergens No results found for: ALMOND, APPLE, ASPARAGUS, AVOCADO, BANANA, BARLEY, BASIL, B[place], GREENBEAN, LIMABEAN, WHITEBEAN, BEEFIGE, REDBEET, BLUEBERRY, BROCCOLI, CABBAGE, MELON, CARROT, CASEIN, CASHEWNUT, CAULIFLOWER, CELERY     Note: Lab results reviewed.   [place]  Drug: Ms. Michelle  reports no history of drug use. Alcohol:  reports no history of alcohol use. Tobacco:  reports that she has never smoked. She has never used smokeless tobacco. Medical:  has a past medical history of Acne, Acute bronchitis, Allergy, Angio-edema, Anxiety, Anxiety state, unspecified, Cervicalgia, Contact lens/glasses fitting, Dysmenorrhea, GERD (gastroesophageal reflux disease), History of cardiac cath, History of stress test, HTN (hypertension), Iron deficiency anemia secondary to  blood loss (chronic), Migraine with aura, without mention of intractable migraine without mention of status migrainosus, Obesity, Other disorders of bone and cartilage(733.99), Pain in thoracic spine, Pharyngitis, Tear of medial meniscus of left knee,  Tear of medial meniscus of right knee, Vaginal delivery, and Wears contact lenses. Family: family history includes Alcohol abuse in her maternal uncle and another family member; Arthritis in an other family member; Diabetes in her maternal aunt and mother; Early death in her mother; Early death (age of onset: 65) in her sister; Gout in her mother; Heart disease in an other family member; Hyperlipidemia in her maternal aunt; Hypertension in her maternal aunt and mother; Kidney failure in her mother; Other in an other family member; Prostate cancer in her maternal uncle.  Past Surgical History:  Procedure Laterality Date  . ABDOMINAL HYSTERECTOMY  04-25-10   Due to abnormal PAP  . CARDIAC CATHETERIZATION  04/2014   [facility]  . COLONOSCOPY WITH PROPOFOL N/A 07/11/2017   Procedure: COLONOSCOPY WITH PROPOFOL;  Surgeon: [name], MD;  Location: [place];  Service: Endoscopy;  Laterality: N/A;  . DILATION AND CURETTAGE OF UTERUS    . ESOPHAGOGASTRODUODENOSCOPY (EGD) WITH PROPOFOL N/A 07/11/2017   Procedure: ESOPHAGOGASTRODUODENOSCOPY (EGD) WITH PROPOFOL;  Surgeon: [name], MD;  Location: [place];  Service: Endoscopy;  Laterality: N/A;  diabetic - diet controlled  . GIVENS CAPSULE STUDY N/A 09/23/2017   Procedure: GIVENS CAPSULE STUDY;  Surgeon: Virgel Manifold, MD;  Location: [facility];  Service: Endoscopy;  Laterality: N/A;  . KNEE ARTHROSCOPY Left 06/10/2013   Procedure: LEFT KNEE ARTHROSCOPY KNEE WITH PARTIAL MEDIAL MENISCECTOMY ;  Surgeon: Lorn Junes, MD;  Location: [place];  Service: Orthopedics;  Laterality: Left;  xerofoam, 4x4's, webril, ace wrap, ice wrap  . KNEE ARTHROSCOPY WITH MEDIAL  MENISECTOMY Right 06/10/2013   Procedure: RIGHT KNEE ARTHROSCOPY WITH MEDIAL MENISECTOMY;  Surgeon: Lorn Junes, MD;  Location: [place];  Service: Orthopedics;  Laterality: Right;  partial lateral menisectomoy and chondroplasty  . OVARIAN CYST REMOVAL  04-25-10   Active Ambulatory Problems    Diagnosis Date Noted  . Insomnia 08/30/2011  . Obesity 08/30/2011  . Allergic rhinitis 10/05/2011  . Angio-edema   . GERD (gastroesophageal reflux disease)   . Migraine with aura and without status migrainosus   . Acne   . Atypical chest pain 08/22/2013  . Essential hypertension 08/22/2013  . Near syncope 12/10/2015  . Dizziness 12/13/2015  . Abnormal brain MRI 12/20/2015  . 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 OSA on CPAP 02/03/2016  . Metabolic syndrome 62/13/0865  . Colon cancer screening   . Polyp of sigmoid colon   . Benign neoplasm of transverse colon   . Iron deficiency anemia   . History of hysterectomy 01/31/2018  . History of ovarian cyst 03/05/2018  . Left ovarian cyst 03/05/2018  . Lumbar spondylosis 12/08/2019  . Chronic pain syndrome 12/08/2019   Resolved Ambulatory Problems    Diagnosis Date Noted  . Anxiety   . Cough 01/05/2013  . Tear of medial meniscus of left knee   . Tear of medial meniscus of right knee   . Tracheobronchitis 03/10/2015  . Serous otitis media 03/10/2015  . Subacute maxillary sinusitis 03/10/2015  . Gross hematuria 01/31/2018  . Vaginal bleeding 01/31/2018   Past Medical History:  Diagnosis Date  . Acute bronchitis   . Allergy   . Anxiety state, unspecified   . Cervicalgia   . Contact lens/glasses fitting   . Dysmenorrhea   . History of cardiac cath   . History of stress test   . HTN (hypertension)   . Iron deficiency anemia secondary to blood loss (chronic)   .  Migraine with aura, without mention of intractable migraine without mention of status migrainosus   . 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 Pain in  thoracic spine   . Pharyngitis   . Vaginal delivery   . Wears contact lenses    Constitutional Exam  General appearance: Well nourished, well developed, and well hydrated. In no apparent acute distress Vitals:   12/08/19 1302  BP: 123/85  Pulse: 98  Resp: 16  Temp: 98.1 F (36.7 C)  SpO2: 99%  Weight: 224 lb (101.6 kg)  Height: '5\' 2"'  (1.575 m)   BMI Assessment: Estimated body mass index is 40.97 kg/m as calculated from the following:   Height as of this encounter: '5\' 2"'  (1.575 m).   Weight as of this encounter: 224 lb (101.6 kg).  BMI interpretation table: BMI level Category Range association with higher incidence of chronic pain  <18 kg/m2 Underweight   18.5-24.9 kg/m2 Ideal body weight   25-29.9 kg/m2 Overweight Increased incidence by 20%  30-34.9 kg/m2 Obese (Class I) Increased incidence by 68%  35-39.9 kg/m2 Severe obesity (Class II) Increased incidence by 136%  >40 kg/m2 Extreme obesity (Class III) Increased incidence by 254%   Patient's current BMI Ideal Body weight  Body mass index is 40.97 kg/m. Ideal body weight: 50.1 kg (110 lb 7.2 oz) Adjusted ideal body weight: 70.7 kg (155 lb 13.9 oz)   BMI Readings from Last 4 Encounters:  12/08/19 40.97 kg/m  10/29/19 41.68 kg/m  04/15/19 40.02 kg/m  02/09/19 39.25 kg/m   Wt Readings from Last 4 Encounters:  12/08/19 224 lb (101.6 kg)  10/29/19 227 lb 14.4 oz (103.4 kg)  04/15/19 218 lb 12.8 oz (99.2 kg)  02/09/19 214 lb 9.6 oz (97.3 kg)    Psych/Mental status: Alert, oriented x 3 (person, place, & time)       Eyes: PERLA Respiratory: No evidence of acute respiratory distress  Cervical Spine Exam  Skin & Axial Inspection: No masses, redness, edema, swelling, or associated skin lesions Alignment: Symmetrical Functional ROM: Unrestricted ROM      Stability: No instability detected Muscle Tone/Strength: Functionally intact. No obvious neuro-muscular anomalies detected. Sensory (Neurological):  Unimpaired Palpation: No palpable anomalies              Upper Extremity (UE) Exam    Side: Right upper extremity  Side: Left upper extremity  Skin & Extremity Inspection: Skin color, temperature, and hair growth are WNL. No peripheral edema or cyanosis. No masses, redness, swelling, asymmetry, or associated skin lesions. No contractures.  Skin & Extremity Inspection: Skin color, temperature, and hair growth are WNL. No peripheral edema or cyanosis. No masses, redness, swelling, asymmetry, or associated skin lesions. No contractures.  Functional ROM: Unrestricted ROM          Functional ROM: Unrestricted ROM          Muscle Tone/Strength: Functionally intact. No obvious neuro-muscular anomalies detected.  Muscle Tone/Strength: Functionally intact. No obvious neuro-muscular anomalies detected.  Sensory (Neurological): Unimpaired          Sensory (Neurological): Unimpaired          Palpation: No palpable anomalies              Palpation: No palpable anomalies              Provocative Test(s):  Phalen's test: deferred Tinel's test: deferred Apley's scratch test (touch opposite shoulder):  Action 1 (Across chest): deferred Action 2 (Overhead): deferred Action 3 (LB reach):  deferred   Provocative Test(s):  Phalen's test: deferred Tinel's test: deferred Apley's scratch test (touch opposite shoulder):  Action 1 (Across chest): deferred Action 2 (Overhead): deferred Action 3 (LB reach): deferred    Thoracic Spine Area Exam  Skin & Axial Inspection: No masses, redness, or swelling Alignment: Symmetrical Functional ROM: Unrestricted ROM Stability: No instability detected Muscle Tone/Strength: Functionally intact. No obvious neuro-muscular anomalies detected. Sensory (Neurological): Unimpaired Muscle strength & Tone: No palpable anomalies  Lumbar Exam  Skin & Axial Inspection: No masses, redness, or swelling Alignment: Symmetrical Functional ROM: Pain restricted ROM       Stability: No  instability detected Muscle Tone/Strength: Functionally intact. No obvious neuro-muscular anomalies detected. Sensory (Neurological): Musculoskeletal pain pattern Palpation: Complains of area being tender to palpation Bilateral Fist Percussion Test Provocative Tests: Hyperextension/rotation test: (+) bilaterally for facet joint pain. Lumbar quadrant test (Kemp's test): (+) bilaterally for facet joint pain. Lateral bending test: (+) due to pain. Patrick's Maneuver: deferred today                   FABER* test: deferred today                   S-I anterior distraction/compression test: deferred today         S-I lateral compression test: deferred today         S-I Thigh-thrust test: deferred today         S-I Gaenslen's test: deferred today         *(Flexion, ABduction and External Rotation)  Gait & Posture Assessment  Ambulation: Unassisted Gait: Relatively normal for age and body habitus Posture: WNL   Lower Extremity Exam    Side: Right lower extremity  Side: Left lower extremity  Stability: No instability observed          Stability: No instability observed          Skin & Extremity Inspection: Skin color, temperature, and hair growth are WNL. No peripheral edema or cyanosis. No masses, redness, swelling, asymmetry, or associated skin lesions. No contractures.  Skin & Extremity Inspection: Skin color, temperature, and hair growth are WNL. No peripheral edema or cyanosis. No masses, redness, swelling, asymmetry, or associated skin lesions. No contractures.  Functional ROM: Unrestricted ROM                  Functional ROM: Unrestricted ROM                  Muscle Tone/Strength: Functionally intact. No obvious neuro-muscular anomalies detected.  Muscle Tone/Strength: Functionally intact. No obvious neuro-muscular anomalies detected.  Sensory (Neurological): Unimpaired        Sensory (Neurological): Unimpaired        DTR: Patellar: deferred today Achilles: deferred today Plantar:  deferred today  DTR: Patellar: deferred today Achilles: deferred today Plantar: deferred today  Palpation: No palpable anomalies  Palpation: No palpable anomalies   Assessment  Primary Diagnosis & Pertinent Problem List: The primary encounter diagnosis was Lumbar facet arthropathy. Diagnoses of Lumbar spondylosis and Chronic pain syndrome were also pertinent to this visit.  Visit Diagnosis (New problems to examiner): 1. Lumbar facet arthropathy   2. Lumbar spondylosis   3. Chronic pain syndrome    Plan of Care (Initial workup plan)  Note: Ms. Keefe was reminded that as per protocol, today's visit has been an evaluation only. We have not taken over the patient's controlled substance management.  General Recommendations: The pain condition that the patient  suffers from is best treated with a multidisciplinary approach that involves an increase in physical activity to prevent de-conditioning and worsening of the pain cycle, as well as psychological counseling (formal and/or informal) to address the co-morbid psychological affects of pain. Treatment will often involve judicious use of pain medications and interventional procedures to decrease the pain, allowing the patient to participate in the physical activity that will ultimately produce long-lasting pain reductions. The goal of the multidisciplinary approach is to return the patient to a higher level of overall function and to restore their ability to perform activities of daily living.  49 year old female with a chief complaint of axial low back pain related to lumbar spondylosis and facet arthropathy as suggested by imaging and physical exam. LISETH WANN has a history of greater than 3 months of moderate to severe pain which is resulted in functional impairment.  The patient has tried various conservative therapeutic options such as NSAIDs, Tylenol, muscle relaxants, physical therapy which was inadequately effective.  Patient's pain is  predominantly axial with physical exam findings suggestive of facet arthropathy. Lumbar facet medial branch nerve blocks were discussed with the patient.  Risks and benefits were reviewed.  Patient would like to proceed with bilateral L3, L4, L5 medial branch nerve block.  Furthermore, recommend patient start gabapentin 300 mg nightly as below and will add tizanidine as needed for lumbar paraspinal muscle spasms.  I will also refer the patient to physical therapy as it has been over 2 years since she has done any formal physical therapy and I would like for her to revisit it with a focus on her lumbar paraspinal muscle strengthening and stretching.   Referral Orders     Ambulatory referral to Physical Therapy  Procedure Orders     LUMBAR FACET(MEDIAL BRANCH NERVE BLOCK) MBNB Pharmacotherapy (current): Medications ordered:  Meds ordered this encounter  Medications  . gabapentin (NEURONTIN) 300 MG capsule    Sig: Take 1 capsule (300 mg total) by mouth at bedtime.    Dispense:  30 capsule    Refill:  1  . tiZANidine (ZANAFLEX) 4 MG tablet    Sig: Take 1 tablet (4 mg total) by mouth daily as needed for muscle spasms.    Dispense:  30 tablet    Refill:  1   Medications administered during this visit: [employer]" had no medications administered during this visit.   Pharmacological management options:  Opioid Analgesics: The patient was informed that there is no guarantee that she would be a candidate for opioid analgesics. The decision will be made following CDC guidelines. This decision will be based on the results of diagnostic studies, as well as Ms. Grunden's risk profile.   Membrane stabilizer: Trial of gabapentin as above.  Consider Lyrica, Cymbalta in future  Muscle relaxant: Trial of tizanidine as above.  Consider alternatives in future such as baclofen, Flexeril, Robaxin.  NSAID: To be determined at a later time  Other analgesic(s): To be determined at a later time    Interventional management options: Ms. Kapusta was informed that there is no guarantee that she would be a candidate for interventional therapies. The decision will be based on the results of diagnostic studies, as well as Ms. Burkhammer's risk profile.  Procedure(s) under consideration:  Diagnostic lumbar facet medial branch nerve blocks at L3, L4, L5 Lumbar radiofrequency ablation Number epidural steroid injection SI joint intervention.   Provider-requested follow-up: Return in about 2 weeks (around 12/22/2019) for L3, L4, L5 facets,  with sedation.  Future Appointments  Date Time Provider [place]  12/16/2019  7:40 AM Steele Sizer, MD [place] [facility]  12/23/2019  9:30 AM Gillis Santa, MD [facility] None    Note by: Gillis Santa, MD Date: 12/08/2019; Time: 3:52 PM

## 2019-12-08 NOTE — Patient Instructions (Signed)
____________________________________________________________________________________________  Preparing for your procedure (without sedation)  Procedure appointments are limited to planned procedures: . No Prescription Refills. . No disability issues will be discussed. . No medication changes will be discussed.  Instructions: . Oral Intake: Do not eat or drink anything for at least 6 hours prior to your procedure. (Exception: Blood Pressure Medication. See below.) . Transportation: Unless otherwise stated by your physician, you may drive yourself after the procedure. . Blood Pressure Medicine: Do not forget to take your blood pressure medicine with a sip of water the morning of the procedure. If your Diastolic (lower reading)is above 100 mmHg, elective cases will be cancelled/rescheduled. . Blood thinners: These will need to be stopped for procedures. Notify our staff if you are taking any blood thinners. Depending on which one you take, there will be specific instructions on how and when to stop it. . Diabetics on insulin: Notify the staff so that you can be scheduled 1st case in the morning. If your diabetes requires high dose insulin, take only  of your normal insulin dose the morning of the procedure and notify the staff that you have done so. . Preventing infections: Shower with an antibacterial soap the morning of your procedure.  . Build-up your immune system: Take 1000 mg of Vitamin C with every meal (3 times a day) the day prior to your procedure. . Antibiotics: Inform the staff if you have a condition or reason that requires you to take antibiotics before dental procedures. . Pregnancy: If you are pregnant, call and cancel the procedure. . Sickness: If you have a cold, fever, or any active infections, call and cancel the procedure. . Arrival: You must be in the facility at least 30 minutes prior to your scheduled procedure. . Children: Do not bring any children with you. . Dress  appropriately: Bring dark clothing that you would not mind if they get stained. . Valuables: Do not bring any jewelry or valuables.  Reasons to call and reschedule or cancel your procedure: (Following these recommendations will minimize the risk of a serious complication.) . Surgeries: Avoid having procedures within 2 weeks of any surgery. (Avoid for 2 weeks before or after any surgery). . Flu Shots: Avoid having procedures within 2 weeks of a flu shots or . (Avoid for 2 weeks before or after immunizations). . Barium: Avoid having a procedure within 7-10 days after having had a radiological study involving the use of radiological contrast. (Myelograms, Barium swallow or enema study). . Heart attacks: Avoid any elective procedures or surgeries for the initial 6 months after a "Myocardial Infarction" (Heart Attack). . Blood thinners: It is imperative that you stop these medications before procedures. Let us know if you if you take any blood thinner.  . Infection: Avoid procedures during or within two weeks of an infection (including chest colds or gastrointestinal problems). Symptoms associated with infections include: Localized redness, fever, chills, night sweats or profuse sweating, burning sensation when voiding, cough, congestion, stuffiness, runny nose, sore throat, diarrhea, nausea, vomiting, cold or Flu symptoms, recent or current infections. It is specially important if the infection is over the area that we intend to treat. . Heart and lung problems: Symptoms that may suggest an active cardiopulmonary problem include: cough, chest pain, breathing difficulties or shortness of breath, dizziness, ankle swelling, uncontrolled high or unusually low blood pressure, and/or palpitations. If you are experiencing any of these symptoms, cancel your procedure and contact your primary care physician for an evaluation.  Remember:  Regular   Business hours are:  Monday to Thursday 8:00 AM to 4:00 PM   Provider's Schedule: Milinda Pointer, MD:  Procedure days: Tuesday and Thursday 7:30 AM to 4:00 PM  Gillis Santa, MD:  Procedure days: Monday and Wednesday 7:30 AM to 4:00 PM ____________________________________________________________________________________________   Facet Joint Block The facet joints connect the bones of the spine (vertebrae). They make it possible for you to bend, twist, and make other movements with your spine. They also keep you from bending too far, twisting too far, and making other extreme movements. A facet joint block is a procedure in which a numbing medicine (anesthetic) is injected into a facet joint. In many cases, an anti-inflammatory medicine (steroid) is also injected. A facet joint block may be done:  To diagnose neck or back pain. If the pain gets better after a facet joint block, it means the pain is probably coming from the facet joint. If the pain does not get better, it means the pain is probably not coming from the facet joint.  To relieve neck or back pain that is caused by an inflamed facet joint. A facet joint block is only done to relieve pain if the pain does not improve with other methods, such as medicine, exercise programs, and physical therapy. Tell a health care provider about:  Any allergies you have.  All medicines you are taking, including vitamins, herbs, eye drops, creams, and over-the-counter medicines.  Any problems you or family members have had with anesthetic medicines.  Any blood disorders you have.  Any surgeries you have had.  Any medical conditions you have or have had.  Whether you are pregnant or may be pregnant. What are the risks? Generally, this is a safe procedure. However, problems may occur, including:  Bleeding.  Injury to a nerve near the injection site.  Pain at the injection site.  Weakness or numbness in areas controlled by nerves near the injection site.  Infection.  Temporary fluid  retention.  Allergic reactions to medicines or dyes.  Injury to other structures or organs near the injection site. What happens before the procedure? Medicines Ask your health care provider about:  Changing or stopping your regular medicines. This is especially important if you are taking diabetes medicines or blood thinners.  Taking medicines such as aspirin and ibuprofen. These medicines can thin your blood. Do not take these medicines unless your health care provider tells you to take them.  Taking over-the-counter medicines, vitamins, herbs, and supplements. Eating and drinking Follow instructions from your health care provider about eating and drinking, which may include:  8 hours before the procedure - stop eating heavy meals or foods, such as meat, fried foods, or fatty foods.  6 hours before the procedure - stop eating light meals or foods, such as toast or cereal.  6 hours before the procedure - stop drinking milk or drinks that contain milk.  2 hours before the procedure - stop drinking clear liquids. Staying hydrated Follow instructions from your health care provider about hydration, which may include:  Up to 2 hours before the procedure - you may continue to drink clear liquids, such as water, clear fruit juice, black coffee, and plain tea. General instructions  Do not use any products that contain nicotine or tobacco for at least 4-6 weeks before the procedure. These products include cigarettes, e-cigarettes, and chewing tobacco. If you need help quitting, ask your health care provider.  Plan to have someone take you home from the hospital or clinic.  Ask your health care provider: ? How your surgery site will be marked. ? What steps will be taken to help prevent infection. These may include:  Removing hair at the surgery site.  Washing skin with a germ-killing soap.  Receiving antibiotic medicine. What happens during the procedure?   You will put on a  hospital gown.  You will lie on your stomach on an X-ray table. You may be asked to lie in a different position if an injection will be made in your neck.  Machines will be used to monitor your oxygen levels, heart rate, and blood pressure.  Your skin will be cleaned.  If an injection will be made in your neck, an IV will be inserted into one of your veins. Fluids and medicine will flow directly into your body through the IV.  A numbing medicine (local anesthetic) will be applied to your skin. Your skin may sting or burn for a moment.  A video X-ray machine (fluoroscopy) will be used to find the joint. In some cases, a CT scan may be used.  A contrast dye may be injected into the facet joint area to help find the joint.  When the joint is located, an anesthetic will be injected into the joint through the needle.  Your health care provider will ask you whether you feel pain relief. ? If you feel relief, a steroid may be injected to provide pain relief for a longer period of time. ? If you do not feel relief or feel only partial relief, additional injections of an anesthetic may be made in other facet joints.  The needle will be removed.  Your skin will be cleaned.  A bandage (dressing) will be applied over each injection site. The procedure may vary among health care providers and hospitals. What happens after the procedure?  Your blood pressure, heart rate, breathing rate, and blood oxygen level will be monitored until you leave the hospital or clinic.  You will lie down and rest for a period of time. Summary  A facet joint block is a procedure in which a numbing medicine (anesthetic) is injected into a facet joint. An anti-inflammatory medicine (stereoid) may also be injected.  Follow instructions from your health care provider about medicines and eating and drinking before the procedure.  Do not use any products that contain nicotine or tobacco for at least 4-6 weeks before  the procedure.  You will lie on your stomach for the procedure, but you may be asked to lie in a different position if an injection will be made in your neck.  When the joint is located, an anesthetic will be injected into the joint through the needle. This information is not intended to replace advice given to you by your health care provider. Make sure you discuss any questions you have with your health care provider. Document Revised: 10/30/2018 Document Reviewed: 06/13/2018 Elsevier Patient Education  [place].

## 2019-12-16 ENCOUNTER — Encounter: Payer: Self-pay | Admitting: Family Medicine

## 2019-12-16 ENCOUNTER — Other Ambulatory Visit: Payer: Self-pay

## 2019-12-16 ENCOUNTER — Ambulatory Visit (INDEPENDENT_AMBULATORY_CARE_PROVIDER_SITE_OTHER): Payer: 59 | Admitting: Family Medicine

## 2019-12-16 VITALS — BP 122/74 | HR 94 | Temp 97.2°F | Resp 16 | Ht 62.0 in | Wt 225.3 lb

## 2019-12-16 DIAGNOSIS — G4733 Obstructive sleep apnea (adult) (pediatric): Secondary | ICD-10-CM

## 2019-12-16 DIAGNOSIS — I10 Essential (primary) hypertension: Secondary | ICD-10-CM | POA: Diagnosis not present

## 2019-12-16 DIAGNOSIS — M47816 Spondylosis without myelopathy or radiculopathy, lumbar region: Secondary | ICD-10-CM

## 2019-12-16 DIAGNOSIS — M5136 Other intervertebral disc degeneration, lumbar region: Secondary | ICD-10-CM | POA: Diagnosis not present

## 2019-12-16 DIAGNOSIS — K219 Gastro-esophageal reflux disease without esophagitis: Secondary | ICD-10-CM

## 2019-12-16 DIAGNOSIS — Z862 Personal history of diseases of the blood and blood-forming organs and certain disorders involving the immune mechanism: Secondary | ICD-10-CM

## 2019-12-16 DIAGNOSIS — E538 Deficiency of other specified B group vitamins: Secondary | ICD-10-CM

## 2019-12-16 DIAGNOSIS — R1013 Epigastric pain: Secondary | ICD-10-CM

## 2019-12-16 DIAGNOSIS — Z9989 Dependence on other enabling machines and devices: Secondary | ICD-10-CM

## 2019-12-16 DIAGNOSIS — E876 Hypokalemia: Secondary | ICD-10-CM

## 2019-12-16 DIAGNOSIS — G43809 Other migraine, not intractable, without status migrainosus: Secondary | ICD-10-CM

## 2019-12-16 DIAGNOSIS — F419 Anxiety disorder, unspecified: Secondary | ICD-10-CM

## 2019-12-16 MED ORDER — CYANOCOBALAMIN 1000 MCG/ML IJ SOLN
1000.0000 ug | Freq: Once | INTRAMUSCULAR | Status: AC
  Administered 2019-12-16: 1000 ug via INTRAMUSCULAR

## 2019-12-16 MED ORDER — VALSARTAN-HYDROCHLOROTHIAZIDE 160-12.5 MG PO TABS: 1.0000 | ORAL_TABLET | Freq: Every day | ORAL | 1 refills | Status: DC

## 2019-12-16 MED ORDER — POTASSIUM CHLORIDE ER 10 MEQ PO CPCR: 10.0000 meq | ORAL_CAPSULE | Freq: Every day | ORAL | 1 refills | Status: DC

## 2019-12-16 MED ORDER — HYDROXYZINE HCL 10 MG PO TABS: 10.0000 mg | ORAL_TABLET | Freq: Three times a day (TID) | ORAL | 0 refills | Status: DC | PRN

## 2019-12-16 MED ORDER — FERROUS SULFATE 324 (65 FE) MG PO TBEC: 1.0000 | DELAYED_RELEASE_TABLET | Freq: Every day | ORAL | 1 refills | Status: DC

## 2019-12-16 MED ORDER — OMEPRAZOLE 40 MG PO CPDR: 40.0000 mg | DELAYED_RELEASE_CAPSULE | Freq: Two times a day (BID) | ORAL | 1 refills | Status: DC

## 2019-12-16 MED ORDER — ESCITALOPRAM OXALATE 10 MG PO TABS: 10.0000 mg | ORAL_TABLET | Freq: Every day | ORAL | 1 refills | Status: DC

## 2019-12-16 NOTE — Progress Notes (Signed)
Name: Marie Vaughan   MRN: [id number]    DOB: 1970-11-25   Date:12/16/2019       Progress Note  Subjective  Chief Complaint  Chief Complaint  Patient presents with  . Gastroesophageal Reflux  . Fatigue    sweating, intermittent chest discomfort  . Hypertension    3 month follow up    HPI  History of iron deficiency anemia: she used to see Dr. Janese Banks, but lost to follow up. She denies  Pica., but has fatigue and SOB. Last ferritin was normal, she is down to one ferrous sulfate daily   Atypical Chest pain: she has seen cardiologist, pulmonologist and GI , she states all evaluation was negative. She continues to have some wheezing , sob with activity and chest pain intermittently even when sitting down. She needs to take deep breaths intermittently. Discussed referral back to pulmonologist of CT to rule out PE or lung pathology. Last imagining study was done 11/12/2019 - CT chest and it was normal. She states it starts with a sensation of fullness in her chest that makes her feel like she needs to burp. She tries drinking , stretching arms and if no improvement it progresses to getting sweaty , shaking hands and feels dizzy. She states it makes her feels drained like she needs to take a nap. Discussed possibly of anxiety and offered medication. She states when having CT scan she felt like she could not breath - started to sweat and radiology tech did some breathing exercise and she calmed down.   Chronic lower back pain with radiculitis: seeing Dr. Noemi Chapel and advised to see by Dr. Ron Agee for possible injections but she was afraid to go. She prefers seeing someone locally, we will place referral for Dr. Holley Raring. She has constant pain on lumbar spine that radiates to her abdomen and sometimes down right leg. She states willing to have a nerve block procedure to see if improves her back pain . She has radiculitis going down right foot   MRI lumbar spine done 07/22/2019 showed L5-S1 facet arthropathy,  small right paracentral annular fissure, also facet diease at other levels and mild disc bulging. Keep follow up with Dr. Holley Raring   HTN: shehas been compliant with medication, taking Valsartan hctz sine Summer of 2020 and is doing well, no side effects of medications, potassium has been slightly low, she does not have a high potassium diet and we will add supplementation   B12 deficiency: taking otc medication but B12 is still low we will start B12 injections  Migraine/vestibular: Shehas not been taking Topamax - she did not like sedation side effect  , she lost to follow up withDr. Manuella Ghazi. Given Imitrex but gets very groggy on it,  Taking Roselyn Meier now but still needs to take a nap once she takes it She continues to have episodes of dizziness, on average she has at least one migraine weekly .  OSA: moderate to severe based on sleep study, started on CPAP at 9 cm H2O July 2017,she has not been wearing her CPAP machine.. Explained importance of resuming CPAP   Metabolic syndrome: she denies polyphagia,but has polydipsia, she has episodes of polyuria ( usually when taking HCTZ),  Morbid obesity she has BMI above 40 and also has co-morbidities such as HTN, GERD and OSA   GERD: seen by GI, she still has daily symptoms, trying to avoid trigger foods. Taking Omeprazole twice daily, explained that bolus sensation my be from anxiety     Patient  Active Problem List   Diagnosis Date Noted  . Lumbar spondylosis 12/08/2019  . Chronic pain syndrome 12/08/2019  . History of ovarian cyst 03/05/2018  . Left ovarian cyst 03/05/2018  . History of hysterectomy 01/31/2018  . Colon cancer screening   . Polyp of sigmoid colon   . Benign neoplasm of transverse colon   . Iron deficiency anemia   . Metabolic syndrome [id number]  . OSA on CPAP 02/03/2016  . 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 Abnormal brain MRI 12/20/2015  . Dizziness 12/13/2015  . Near syncope 12/10/2015  . Atypical chest pain  08/22/2013  . Essential hypertension 08/22/2013  . Angio-edema   . GERD (gastroesophageal reflux disease)   . Migraine with aura and without status migrainosus   . Acne   . Allergic rhinitis 10/05/2011  . Insomnia 08/30/2011  . Obesity 08/30/2011    Past Surgical History:  Procedure Laterality Date  . ABDOMINAL HYSTERECTOMY  04-25-10   Due to abnormal PAP  . CARDIAC CATHETERIZATION  04/2014   [facility]  . COLONOSCOPY WITH PROPOFOL N/A 07/11/2017   Procedure: COLONOSCOPY WITH PROPOFOL;  Surgeon: [name], MD;  Location: [place];  Service: Endoscopy;  Laterality: N/A;  . DILATION AND CURETTAGE OF UTERUS    . ESOPHAGOGASTRODUODENOSCOPY (EGD) WITH PROPOFOL N/A 07/11/2017   Procedure: ESOPHAGOGASTRODUODENOSCOPY (EGD) WITH PROPOFOL;  Surgeon: [name], MD;  Location: [place];  Service: Endoscopy;  Laterality: N/A;  diabetic - diet controlled  . GIVENS CAPSULE STUDY N/A 09/23/2017   Procedure: GIVENS CAPSULE STUDY;  Surgeon: Virgel Manifold, MD;  Location: [facility];  Service: Endoscopy;  Laterality: N/A;  . KNEE ARTHROSCOPY Left 06/10/2013   Procedure: LEFT KNEE ARTHROSCOPY KNEE WITH PARTIAL MEDIAL MENISCECTOMY ;  Surgeon: Lorn Junes, MD;  Location: [place];  Service: Orthopedics;  Laterality: Left;  xerofoam, 4x4's, webril, ace wrap, ice wrap  . KNEE ARTHROSCOPY WITH MEDIAL MENISECTOMY Right 06/10/2013   Procedure: RIGHT KNEE ARTHROSCOPY WITH MEDIAL MENISECTOMY;  Surgeon: Lorn Junes, MD;  Location: [place];  Service: Orthopedics;  Laterality: Right;  partial lateral menisectomoy and chondroplasty  . OVARIAN CYST REMOVAL  04-25-10    Family History  Problem Relation Age of Onset  . Early death Mother        Murdered when pt was 60 years old  . Diabetes Mother   . Hypertension Mother   . Gout Mother   . Kidney failure Mother   . Early death Sister 68       due to hemorage  . Alcohol abuse Maternal Uncle    . Alcohol abuse Other   . Arthritis Other   . Heart disease Other   . Other Other        cousin- phlebitis  . Diabetes Maternal Aunt   . Hyperlipidemia Maternal Aunt   . Hypertension Maternal Aunt   . Prostate cancer Maternal Uncle   . Breast cancer Neg Hx     Social History   Tobacco Use  . Smoking status: Never Smoker  . Smokeless tobacco: Never Used  Substance Use Topics  . Alcohol use: No    Alcohol/week: 0.0 standard drinks     Current Outpatient Medications:  .  aspirin EC 81 MG tablet, Take 1 tablet (81 mg total) by mouth daily., Disp: 90 tablet, Rfl: 3 .  Cyanocobalamin (B-12) 1000 MCG SUBL, Place 1 tablet under the tongue daily., Disp: 90 each, Rfl: 1 .  diclofenac sodium (VOLTAREN) 1 %  GEL, Apply 4 g topically 4 (four) times daily., Disp: 300 g, Rfl: 0 .  ferrous sulfate 324 (65 Fe) MG TBEC, Take by mouth., Disp: , Rfl:  .  gabapentin (NEURONTIN) 300 MG capsule, Take 1 capsule (300 mg total) by mouth at bedtime., Disp: 30 capsule, Rfl: 1 .  omeprazole (PRILOSEC) 40 MG capsule, Take 1 capsule (40 mg total) by mouth 2 (two) times daily., Disp: 180 capsule, Rfl: 1 .  tiZANidine (ZANAFLEX) 4 MG tablet, Take 1 tablet (4 mg total) by mouth daily as needed for muscle spasms., Disp: 30 tablet, Rfl: 1 .  Ubrogepant (UBRELVY) 50 MG TABS, Take 1-2 tablets by mouth daily as needed., Disp: 10 tablet, Rfl: 2 .  valsartan-hydrochlorothiazide (DIOVAN-HCT) 160-12.5 MG tablet, Take 1 tablet by mouth daily., Disp: 90 tablet, Rfl: 1  Allergies  Allergen Reactions  . Iodinated Diagnostic Agents Shortness Of Breath  . Chocolate     Hives, itching  . Chocolate Flavor Other (See Comments)    Hives, itching  . Iohexol     CP and SOB  . Peanut-Containing Drug Products Swelling    cashews  . Pineapple     I personally reviewed active problem list, medication list, allergies, family history, social history, health maintenance with the patient/caregiver  today.   ROS  Constitutional: Negative for fever or weight change.  Respiratory: Negative for cough and shortness of breath.   Cardiovascular: positive for intermittent chest pain and palpitations.  Gastrointestinal: Negative for abdominal pain, no bowel changes.  Musculoskeletal: Negative for gait problem or joint swelling.  Skin: Negative for rash.  Neurological: positive  For intermittent  dizziness or headache.  No other specific complaints in a complete review of systems (except as listed in HPI above).  Objective  Vitals:   12/16/19 0741  BP: 122/74  Pulse: 94  Resp: 16  Temp: (!) 97.2 F (36.2 C)  TempSrc: Temporal  SpO2: 98%  Weight: 225 lb 4.8 oz (102.2 kg)  Height: 5\' 2"  (1.575 m)    Body mass index is 41.21 kg/m.  Physical Exam  Constitutional: Patient appears well-developed and well-nourished. Obese  No distress.  HEENT: head atraumatic, normocephalic, pupils equal and reactive to light,  neck supple Cardiovascular: Normal rate, regular rhythm and normal heart sounds.  No murmur heard. No BLE edema. Pulmonary/Chest: Effort normal and breath sounds normal. No respiratory distress. Abdominal: Soft.  There is no tenderness. Psychiatric: Patient has a normal mood and affect. behavior is normal. Judgment and thought content normal.  Recent Results (from the past 2160 hour(s))  Ferritin     Status: None   Collection Time: 10/29/19  1:51 PM  Result Value Ref Range   Ferritin 203 11 - 307 ng/mL    Comment: Performed at [facility], [address] [place], [place] [address]  Hemoglobin A1c     Status: Abnormal   Collection Time: 10/29/19  1:51 PM  Result Value Ref Range   Hgb A1c MFr Bld 6.2 (H) 4.8 - 5.6 %    Comment: (NOTE)         Prediabetes: 5.7 - 6.4         Diabetes: >6.4         Glycemic control for adults with diabetes: <7.0    Mean Plasma Glucose 131 mg/dL    Comment: (NOTE) Performed At: [facility] [address]  [place], [place] [id number] Rush Farmer MD [id number]   Lipid panel     Status: Abnormal  Collection Time: 10/29/19  1:51 PM  Result Value Ref Range   Cholesterol 162 0 - 200 mg/dL   Triglycerides 191 (H) <150 mg/dL   HDL 48 >40 mg/dL   Total CHOL/HDL Ratio 3.4 RATIO   VLDL 38 0 - 40 mg/dL   LDL Cholesterol 76 0 - 99 mg/dL    Comment:        Total Cholesterol/HDL:CHD Risk Coronary Heart Disease Risk Table                     Men   Women  1/2 Average Risk   3.4   3.3  Average Risk       5.0   4.4  2 X Average Risk   9.6   7.1  3 X Average Risk  23.4   11.0        Use the calculated Patient Ratio above and the CHD Risk Table to determine the patient's CHD Risk.        ATP III CLASSIFICATION (LDL):  <100     mg/dL   Optimal  100-129  mg/dL   Near or Above                    Optimal  130-159  mg/dL   Borderline  160-189  mg/dL   High  >190     mg/dL   Very High Performed at [address] [place], [place] [address]   Comprehensive metabolic panel     Status: Abnormal   Collection Time: 10/29/19  1:51 PM  Result Value Ref Range   Sodium 137 135 - 145 mmol/L   Potassium 3.4 (L) 3.5 - 5.1 mmol/L   Chloride 104 98 - 111 mmol/L   CO2 24 22 - 32 mmol/L   Glucose, Bld 100 (H) 70 - 99 mg/dL    Comment: Glucose reference range applies only to samples taken after fasting for at least 8 hours.   BUN 10 6 - 20 mg/dL   Creatinine, Ser 0.64 0.44 - 1.00 mg/dL   Calcium 9.6 8.9 - 10.3 mg/dL   Total Protein 7.7 6.5 - 8.1 g/dL   Albumin 4.1 3.5 - 5.0 g/dL   AST 20 15 - 41 U/L   ALT 22 0 - 44 U/L   Alkaline Phosphatase 82 38 - 126 U/L   Total Bilirubin 0.5 0.3 - 1.2 mg/dL   GFR calc non Af Amer >60 >60 mL/min   GFR calc Af Amer >60 >60 mL/min   Anion gap 9 5 - 15    Comment: Performed at [facility], [address] [place], [place] [address]  CBC with Differential/Platelet     Status: None   Collection Time: 10/29/19  1:51 PM  Result Value  Ref Range   WBC 8.8 4.0 - 10.5 K/uL   RBC 4.56 3.87 - 5.11 MIL/uL   Hemoglobin 13.0 12.0 - 15.0 g/dL   HCT 39.7 36.0 - 46.0 %   MCV 87.1 80.0 - 100.0 fL   MCH 28.5 26.0 - 34.0 pg   MCHC 32.7 30.0 - 36.0 g/dL   RDW 13.2 11.5 - 15.5 %   Platelets 304 150 - 400 K/uL   nRBC 0.0 0.0 - 0.2 %   Neutrophils Relative % 61 %   Neutro Abs 5.4 1.7 - 7.7 K/uL   Lymphocytes Relative 28 %   Lymphs Abs 2.5 0.7 - 4.0 K/uL   Monocytes Relative 9 %  Monocytes Absolute 0.8 0.1 - 1.0 K/uL   Eosinophils Relative 1 %   Eosinophils Absolute 0.1 0.0 - 0.5 K/uL   Basophils Relative 1 %   Basophils Absolute 0.0 0.0 - 0.1 K/uL   Immature Granulocytes 0 %   Abs Immature Granulocytes 0.03 0.00 - 0.07 K/uL    Comment: Performed at [facility], [address] [place], [place] [address]  Vitamin B12     Status: None   Collection Time: 10/29/19  1:54 PM  Result Value Ref Range   Vitamin B-12 290 180 - 914 pg/mL    Comment: (NOTE) This assay is not validated for testing neonatal or myeloproliferative syndrome specimens for Vitamin B12 levels. Performed at [facility], [place] [address], [place], [place] [address]       PHQ2/9: Depression screen [facility] 2/9 12/16/2019 12/08/2019 10/29/2019 09/18/2019 04/15/2019  Decreased Interest 0 0 0 0 0  Down, Depressed, Hopeless 0 0 0 0 0  PHQ - 2 Score 0 0 0 0 0  Altered sleeping 0 2 0 0 1  Tired, decreased energy 0 1 0 0 0  Change in appetite 0 1 0 0 0  Feeling bad or failure about yourself  0 0 0 0 0  Trouble concentrating 0 0 0 0 0  Moving slowly or fidgety/restless 0 0 0 0 0  Suicidal thoughts 0 0 0 0 0  PHQ-9 Score 0 4 0 0 1  Difficult doing work/chores Not difficult at all - - - Not difficult at all    phq 9 is negative   Fall Risk: Fall Risk  12/16/2019 12/08/2019 10/29/2019 09/18/2019 04/15/2019  Falls in the past year? 0 1 0 0 1  Comment - - - - -  Number falls in past yr: 0 0 0 0 1  Injury with Fall? 0 0 0 0 0  Risk Factor Category  - - - -  -  Risk for fall due to : - Impaired balance/gait - - -  Risk for fall due to: Comment - - - - -  Follow up Falls evaluation completed - - - -    Functional Status Survey: Is the patient deaf or have difficulty hearing?: No Does the patient have difficulty seeing, even when wearing glasses/contacts?: No Does the patient have difficulty concentrating, remembering, or making decisions?: No Does the patient have difficulty walking or climbing stairs?: No Does the patient have difficulty dressing or bathing?: No Does the patient have difficulty doing errands alone such as visiting a doctor's office or shopping?: No    Assessment & Plan  1. Essential hypertension  - valsartan-hydrochlorothiazide (DIOVAN-HCT) 160-12.5 MG tablet; Take 1 tablet by mouth daily.  Dispense: 90 tablet; Refill: 1  2. Dyspepsia  - omeprazole (PRILOSEC) 40 MG capsule; Take 1 capsule (40 mg total) by mouth 2 (two) times daily.  Dispense: 180 capsule; Refill: 1  3. Vestibular migraine  Taking Roselyn Meier and works for her after a nap   4. B12 deficiency  - cyanocobalamin ((VITAMIN B-12)) injection 1,000 mcg  5. Lumbar facet arthropathy   6. History of iron deficiency anemia  - ferrous sulfate 324 (65 Fe) MG TBEC; Take 1 tablet (325 mg total) by mouth daily.  Dispense: 90 tablet; Refill: 1  7. OSA on CPAP  Needs to resume CPAP   8. GERD without esophagitis   9. DDD (degenerative disc disease), lumbar  Under the care of Dr. Holley Raring   10. Hypokalemia  - potassium  chloride (MICRO-K) 10 MEQ CR capsule; Take 1 capsule (10 mEq total) by mouth daily.  Dispense: 90 capsule; Refill: 1  11. Morbid obesity ([place])  Discussed with the patient the risk posed by an increased BMI. Discussed importance of portion control, calorie counting and at least 150 minutes of physical activity weekly. Avoid sweet beverages and drink more water. Eat at least 6 servings of fruit and vegetables daily

## 2019-12-16 NOTE — Patient Instructions (Signed)

## 2019-12-23 ENCOUNTER — Other Ambulatory Visit: Payer: Self-pay

## 2019-12-23 ENCOUNTER — Encounter: Payer: Self-pay | Admitting: Student in an Organized Health Care Education/Training Program

## 2019-12-23 ENCOUNTER — Ambulatory Visit
Admission: RE | Admit: 2019-12-23 | Discharge: 2019-12-23 | Disposition: A | Discharge status: Home / Self Care | Payer: 59 | Source: Ambulatory Visit | Attending: Student in an Organized Health Care Education/Training Program | Admitting: Student in an Organized Health Care Education/Training Program

## 2019-12-23 ENCOUNTER — Ambulatory Visit (HOSPITAL_BASED_OUTPATIENT_CLINIC_OR_DEPARTMENT_OTHER): Payer: 59 | Admitting: Student in an Organized Health Care Education/Training Program

## 2019-12-23 ENCOUNTER — Encounter: Payer: Self-pay | Admitting: Emergency Medicine

## 2019-12-23 VITALS — BP 141/88 | HR 95 | Temp 96.5°F | Resp 16 | Ht 62.0 in | Wt 223.0 lb

## 2019-12-23 DIAGNOSIS — M47816 Spondylosis without myelopathy or radiculopathy, lumbar region: Secondary | ICD-10-CM

## 2019-12-23 DIAGNOSIS — G894 Chronic pain syndrome: Secondary | ICD-10-CM | POA: Diagnosis not present

## 2019-12-23 MED ORDER — ROPIVACAINE HCL 2 MG/ML IJ SOLN
9.0000 mL | Freq: Once | INTRAMUSCULAR | Status: AC
  Administered 2019-12-23: 9 mL via PERINEURAL

## 2019-12-23 MED ORDER — FENTANYL CITRATE (PF) 100 MCG/2ML IJ SOLN
INTRAMUSCULAR | Status: AC
  Filled 2019-12-23: qty 2

## 2019-12-23 MED ORDER — LIDOCAINE HCL 2 % IJ SOLN
INTRAMUSCULAR | Status: AC
  Filled 2019-12-23: qty 20

## 2019-12-23 MED ORDER — LIDOCAINE HCL 2 % IJ SOLN
20.0000 mL | Freq: Once | INTRAMUSCULAR | Status: AC
  Administered 2019-12-23: 400 mg

## 2019-12-23 MED ORDER — DEXAMETHASONE SODIUM PHOSPHATE 10 MG/ML IJ SOLN
10.0000 mg | Freq: Once | INTRAMUSCULAR | Status: AC
  Administered 2019-12-23: 10 mg

## 2019-12-23 MED ORDER — FENTANYL CITRATE (PF) 100 MCG/2ML IJ SOLN
25.0000 ug | INTRAMUSCULAR | Status: DC | PRN
  Administered 2019-12-23: 50 ug via INTRAVENOUS

## 2019-12-23 MED ORDER — ROPIVACAINE HCL 2 MG/ML IJ SOLN
INTRAMUSCULAR | Status: AC
  Filled 2019-12-23: qty 20

## 2019-12-23 MED ORDER — DEXAMETHASONE SODIUM PHOSPHATE 10 MG/ML IJ SOLN
INTRAMUSCULAR | Status: AC
  Filled 2019-12-23: qty 2

## 2019-12-23 NOTE — Progress Notes (Signed)
Safety precautions to be maintained throughout the outpatient stay will include: orient to surroundings, keep bed in low position, maintain call bell within reach at all times, provide assistance with transfer out of bed and ambulation.  

## 2019-12-23 NOTE — Progress Notes (Signed)
PROVIDER NOTE: Information contained herein reflects review and annotations entered in association with encounter. Interpretation of such information and data should be left to medically-trained personnel. Information provided to patient can be located elsewhere in the medical record under "Patient Instructions". Document created using STT-dictation technology, any transcriptional errors that may result from process are unintentional.    Patient: Marie Vaughan  Service Category: Procedure  Provider: Gillis Santa, MD  DOB: [birth year]  DOS: 12/23/2019  Location: [facility]  MRN: [id number]  Setting: Ambulatory - outpatient  Referring Provider: Steele Sizer, MD  Type: Established Patient  Specialty: Interventional Pain Management  PCP: Steele Sizer, MD   Primary Reason for Visit: Interventional Pain Management Treatment. CC: Back Pain (low)  Procedure:          Anesthesia, Analgesia, Anxiolysis:  Type: Lumbar Facet, Medial Branch Block(s) #1  Primary Purpose: Diagnostic Region: Posterolateral Lumbosacral Spine Level:  L3, L4, L5, Medial Branch Level(s). Injecting these levels blocks the L3-4, L4-5 lumbar facet joints. Laterality: Bilateral  Type: Moderate (Conscious) Sedation combined with Local Anesthesia Indication(s): Analgesia and Anxiety Route: Intravenous (IV) IV Access: Secured Sedation: Meaningful verbal contact was maintained at all times during the procedure  Local Anesthetic: Lidocaine 1-2%  Position: Prone   Indications: 1. Lumbar facet arthropathy   2. Lumbar spondylosis   3. Chronic pain syndrome    Pain Score: Pre-procedure: 6 /10 Post-procedure: 0-No pain(head 10/ 10)/10   Pre-op Assessment:  Marie Vaughan is a 49 y.o. (year old), female patient, seen today for interventional treatment. She  has a past surgical history that includes Ovarian cyst removal (04-25-10); Abdominal hysterectomy (04-25-10); Knee arthroscopy with medial menisectomy (Right,  06/10/2013); Knee arthroscopy (Left, 06/10/2013); Dilation and curettage of uterus; Cardiac catheterization (04/2014); Colonoscopy with propofol (N/A, 07/11/2017); Esophagogastroduodenoscopy (egd) with propofol (N/A, 07/11/2017); and Givens capsule study (N/A, 09/23/2017). Marie Vaughan has a current medication list which includes the following prescription(s): aspirin ec, b-12, diclofenac sodium, escitalopram, ferrous sulfate, gabapentin, hydroxyzine, omeprazole, potassium chloride, tizanidine, ubrelvy, and valsartan-hydrochlorothiazide, and the following Facility-Administered Medications: fentanyl. Her primarily concern today is the Back Pain (low)  Initial Vital Signs:  Pulse/HCG Rate: 95ECG Heart Rate: 89 Temp: (!) 97.2 F (36.2 C) Resp: 18 BP: 132/89 SpO2: 100 %  BMI: Estimated body mass index is 40.79 kg/m as calculated from the following:   Height as of this encounter: 5\' 2"  (1.575 m).   Weight as of this encounter: 223 lb (101.2 kg).  Risk Assessment: Allergies: Reviewed. She is allergic to chocolate; chocolate flavor; peanut-containing drug products; and pineapple.  Allergy Precautions: None required Coagulopathies: Reviewed. None identified.  Blood-thinner therapy: None at this time Active Infection(s): Reviewed. None identified. Marie Vaughan is afebrile  Site Confirmation: Marie Vaughan was asked to confirm the procedure and laterality before marking the site Procedure checklist: Completed Consent: Before the procedure and under the influence of no sedative(s), amnesic(s), or anxiolytics, the patient was informed of the treatment options, risks and possible complications. To fulfill our ethical and legal obligations, as recommended by the American Medical Association's Code of Ethics, I have informed the patient of my clinical impression; the nature and purpose of the treatment or procedure; the risks, benefits, and possible complications of the intervention; the alternatives, including doing  nothing; the risk(s) and benefit(s) of the alternative treatment(s) or procedure(s); and the risk(s) and benefit(s) of doing nothing. The patient was provided information about the general risks and possible complications associated with the procedure. These may include, but are  not limited to: failure to achieve desired goals, infection, bleeding, organ or nerve damage, allergic reactions, paralysis, and death. In addition, the patient was informed of those risks and complications associated to Spine-related procedures, such as failure to decrease pain; infection (i.e.: Meningitis, epidural or intraspinal abscess); bleeding (i.e.: epidural hematoma, subarachnoid hemorrhage, or any other type of intraspinal or peri-dural bleeding); organ or nerve damage (i.e.: Any type of peripheral nerve, nerve root, or spinal cord injury) with subsequent damage to sensory, motor, and/or autonomic systems, resulting in permanent pain, numbness, and/or weakness of one or several areas of the body; allergic reactions; (i.e.: anaphylactic reaction); and/or death. Furthermore, the patient was informed of those risks and complications associated with the medications. These include, but are not limited to: allergic reactions (i.e.: anaphylactic or anaphylactoid reaction(s)); adrenal axis suppression; blood sugar elevation that in diabetics may result in ketoacidosis or comma; water retention that in patients with history of congestive heart failure may result in shortness of breath, pulmonary edema, and decompensation with resultant heart failure; weight gain; swelling or edema; medication-induced neural toxicity; particulate matter embolism and blood vessel occlusion with resultant organ, and/or nervous system infarction; and/or aseptic necrosis of one or more joints. Finally, the patient was informed that Medicine is not an exact science; therefore, there is also the possibility of unforeseen or unpredictable risks and/or possible  complications that may result in a catastrophic outcome. The patient indicated having understood very clearly. We have given the patient no guarantees and we have made no promises. Enough time was given to the patient to ask questions, all of which were answered to the patient's satisfaction. Ms. Schwarting has indicated that she wanted to continue with the procedure. Attestation: I, the ordering provider, attest that I have discussed with the patient the benefits, risks, side-effects, alternatives, likelihood of achieving goals, and potential problems during recovery for the procedure that I have provided informed consent. Date  Time: 12/23/2019  9:11 AM  Pre-Procedure Preparation:  Monitoring: As per clinic protocol. Respiration, ETCO2, SpO2, BP, heart rate and rhythm monitor placed and checked for adequate function Safety Precautions: Patient was assessed for positional comfort and pressure points before starting the procedure. Time-out: I initiated and conducted the "Time-out" before starting the procedure, as per protocol. The patient was asked to participate by confirming the accuracy of the "Time Out" information. Verification of the correct person, site, and procedure were performed and confirmed by me, the nursing staff, and the patient. "Time-out" conducted as per Joint Commission's Universal Protocol (UP.01.01.01). Time: 0956  Description of Procedure:          Laterality: Bilateral. The procedure was performed in identical fashion on both sides. Levels:   L3, L4, L5,Medial Branch Level(s) Area Prepped: Posterior Lumbosacral Region DuraPrep (Iodine Povacrylex [0.7% available iodine] and Isopropyl Alcohol, 74% w/w) Safety Precautions: Aspiration looking for blood return was conducted prior to all injections. At no point did we inject any substances, as a needle was being advanced. Before injecting, the patient was told to immediately notify me if she was experiencing any new onset of "ringing in  the ears, or metallic taste in the mouth". No attempts were made at seeking any paresthesias. Safe injection practices and needle disposal techniques used. Medications properly checked for expiration dates. SDV (single dose vial) medications used. After the completion of the procedure, all disposable equipment used was discarded in the proper designated medical waste containers. Local Anesthesia: Protocol guidelines were followed. The patient was positioned over the fluoroscopy table.  The area was prepped in the usual manner. The time-out was completed. The target area was identified using fluoroscopy. A 12-in long, straight, sterile hemostat was used with fluoroscopic guidance to locate the targets for each level blocked. Once located, the skin was marked with an approved surgical skin marker. Once all sites were marked, the skin (epidermis, dermis, and hypodermis), as well as deeper tissues (fat, connective tissue and muscle) were infiltrated with a small amount of a short-acting local anesthetic, loaded on a 10cc syringe with a 25G, 1.5-in  Needle. An appropriate amount of time was allowed for local anesthetics to take effect before proceeding to the next step. Local Anesthetic: Lidocaine 2.0% The unused portion of the local anesthetic was discarded in the proper designated containers. Technical explanation of process:  L3 Medial Branch Nerve Block (MBB): The target area for the L3 medial branch is at the junction of the postero-lateral aspect of the superior articular process and the superior, posterior, and medial edge of the transverse process of L4. Under fluoroscopic guidance, a Quincke needle was inserted until contact was made with os over the superior postero-lateral aspect of the pedicular shadow (target area). After negative aspiration for blood, 1 mL of the nerve block solution was injected without difficulty or complication. The needle was removed intact. L4 Medial Branch Nerve Block (MBB): The  target area for the L4 medial branch is at the junction of the postero-lateral aspect of the superior articular process and the superior, posterior, and medial edge of the transverse process of L5. Under fluoroscopic guidance, a Quincke needle was inserted until contact was made with os over the superior postero-lateral aspect of the pedicular shadow (target area). After negative aspiration for blood, 1 mL of the nerve block solution was injected without difficulty or complication. The needle was removed intact. L5 Medial Branch Nerve Block (MBB): The target area for the L5 medial branch is at the junction of the postero-lateral aspect of the superior articular process and the superior, posterior, and medial edge of the sacral ala. Under fluoroscopic guidance, a Quincke needle was inserted until contact was made with os over the superior postero-lateral aspect of the pedicular shadow (target area). After negative aspiration for blood, 73mL of the nerve block solution was injected without difficulty or complication. The needle was removed intact.  Nerve block solution: 10 cc solution made of 8 cc of 0.2% ropivacaine, 2 cc of Decadron, 10 mg/cc.  1 cc injected at each level above bilaterally.  The unused portion of the solution was discarded in the proper designated containers. Procedural Needles: 22-gauge, 3.5-inch, Quincke needles used for all levels.  Once the entire procedure was completed, the treated area was cleaned, making sure to leave some of the prepping solution back to take advantage of its long term bactericidal properties.   Illustration of the posterior view of the lumbar spine and the posterior neural structures. Laminae of L2 through S1 are labeled. DPRL5, dorsal primary ramus of L5; DPRS1, dorsal primary ramus of S1; DPR3, dorsal primary ramus of L3; FJ, facet (zygapophyseal) joint L3-L4; I, inferior articular process of L4; LB1, lateral branch of dorsal primary ramus of L1; IAB, inferior  articular branches from L3 medial branch (supplies L4-L5 facet joint); IBP, intermediate branch plexus; MB3, medial branch of dorsal primary ramus of L3; NR3, third lumbar nerve root; S, superior articular process of L5; SAB, superior articular branches from L4 (supplies L4-5 facet joint also); TP3, transverse process of L3.  Vitals:   12/23/19  1020 12/23/19 1030 12/23/19 1040 12/23/19 1050  BP: (!) 149/99 (!) 141/87 138/87 (!) 141/88  Pulse:      Resp: 14 14 16 16   Temp: (!) 96.5 F (35.8 C)     SpO2: 100% 100% 100% 99%  Weight:      Height:         Start Time: 0956 hrs. End Time: 1009 hrs.  Imaging Guidance (Spinal):          Type of Imaging Technique: Fluoroscopy Guidance (Spinal) Indication(s): Assistance in needle guidance and placement for procedures requiring needle placement in or near specific anatomical locations not easily accessible without such assistance. Exposure Time: Please see nurses notes. Contrast: None used. Fluoroscopic Guidance: I was personally present during the use of fluoroscopy. "Tunnel Vision Technique" used to obtain the best possible view of the target area. Parallax error corrected before commencing the procedure. "Direction-depth-direction" technique used to introduce the needle under continuous pulsed fluoroscopy. Once target was reached, antero-posterior, oblique, and lateral fluoroscopic projection used confirm needle placement in all planes. Images permanently stored in EMR. Interpretation: No contrast injected. I personally interpreted the imaging intraoperatively. Adequate needle placement confirmed in multiple planes. Permanent images saved into the patient's record.  Antibiotic Prophylaxis:   Anti-infectives (From admission, onward)   None     Indication(s): None identified  Post-operative Assessment:  Post-procedure Vital Signs:  Pulse/HCG Rate: 9581 Temp: (!) 96.5 F (35.8 C) Resp: 16 BP: (!) 141/88(1068ml fluids given, 227ml  po) SpO2: 99 %  EBL: None  Patient did have a mild frontal headache after her procedure.  This was not positional.  This was associated with elevated blood pressures.  As the patient's blood pressure decreased back to her baseline, her headaches also improved.  She endorses 0 pain in her low back region after the facet blocks.  We will continue to monitor.  Recommend patient hydrate herself when she gets home and eat something.  Note: The patient tolerated the entire procedure well. A repeat set of vitals were taken after the procedure and the patient was kept under observation following institutional policy, for this type of procedure. Post-procedural neurological assessment was performed, showing return to baseline, prior to discharge. The patient was provided with post-procedure discharge instructions, including a section on how to identify potential problems. Should any problems arise concerning this procedure, the patient was given instructions to immediately contact us, at any time, without hesitation. In any case, we plan to contact the patient by telephone for a follow-up status report regarding this interventional procedure.  Comments:  No additional relevant information.  Plan of Care  Orders:  Orders Placed This Encounter  Procedures  . DG [facility] C-ARM 1-60 MIN NO REPORT    Intraoperative interpretation by procedural physician at [place].    Standing Status:   Standing    Number of Occurrences:   1    Order Specific Question:   Reason for exam:    Answer:   Assistance in needle guidance and placement for procedures requiring needle placement in or near specific anatomical locations not easily accessible without such assistance.    Medications ordered for procedure: Meds ordered this encounter  Medications  . lidocaine (XYLOCAINE) 2 % (with pres) injection 400 mg  . fentaNYL (SUBLIMAZE) injection 25-50 mcg    Make sure Narcan is available in the pyxis when  using this medication. In the event of respiratory depression (RR< 8/min): Titrate NARCAN (naloxone) in increments of 0.1 to 0.2  mg IV at 2-3 minute intervals, until desired degree of reversal.  . ropivacaine (PF) 2 mg/mL (0.2%) (NAROPIN) injection 9 mL  . dexamethasone (DECADRON) injection 10 mg  . dexamethasone (DECADRON) injection 10 mg   Medications administered: We administered lidocaine, fentaNYL, ropivacaine (PF) 2 mg/mL (0.2%), dexamethasone, and dexamethasone.  See the medical record for exact dosing, route, and time of administration.  Follow-up plan:   Return in about 4 weeks (around 01/20/2020), or call PT for referral, for Post Procedure Evaluation, Medication Management.      Status post L3, L4, L5 lumbar facet medial branch nerve block on 12/23/2019   Recent Visits Date Type Provider Dept  12/08/19 Office Visit Gillis Santa, MD [facility]  Showing recent visits within past 90 days and meeting all other requirements   Today's Visits Date Type Provider Dept  12/23/19 Procedure visit Gillis Santa, MD [facility]  Showing today's visits and meeting all other requirements   Future Appointments Date Type Provider Dept  01/20/20 Appointment Gillis Santa, MD [facility]  Showing future appointments within next 90 days and meeting all other requirements   Disposition: Discharge home  Discharge (Date  Time): 12/23/2019;   hrs.   Primary Care Physician: Steele Sizer, MD Location: [facility] Note by: Gillis Santa, MD Date: 12/23/2019; Time: 11:36 AM  Disclaimer:  Medicine is not an exact science. The only guarantee in medicine is that nothing is guaranteed. It is important to note that the decision to proceed with this intervention was based on the information collected from the patient. The Data and conclusions were drawn from the patient's questionnaire, the interview, and the physical examination. Because the  information was provided in large part by the patient, it cannot be guaranteed that it has not been purposely or unconsciously manipulated. Every effort has been made to obtain as much relevant data as possible for this evaluation. It is important to note that the conclusions that lead to this procedure are derived in large part from the available data. Always take into account that the treatment will also be dependent on availability of resources and existing treatment guidelines, considered by other Pain Management Practitioners as being common knowledge and practice, at the time of the intervention. For Medico-Legal purposes, it is also important to point out that variation in procedural techniques and pharmacological choices are the acceptable norm. The indications, contraindications, technique, and results of the above procedure should only be interpreted and judged by a Board-Certified Interventional Pain Specialist with extensive familiarity and expertise in the same exact procedure and technique.

## 2019-12-23 NOTE — Patient Instructions (Addendum)
Please call physical therapy and facilitate referral for patient.____________________________________________________________________________________________  Post-Procedure Discharge Instructions  Instructions:  Apply ice:   Purpose: This will minimize any swelling and discomfort after procedure.   When: Day of procedure, as soon as you get home.  How: Fill a plastic sandwich bag with crushed ice. Cover it with a small towel and apply to injection site.  How long: (15 min on, 15 min off) Apply for 15 minutes then remove x 15 minutes.  Repeat sequence on day of procedure, until you go to bed.  Apply heat:   Purpose: To treat any soreness and discomfort from the procedure.  When: Starting the next day after the procedure.  How: Apply heat to procedure site starting the day following the procedure.  How long: May continue to repeat daily, until discomfort goes away.  Food intake: Start with clear liquids (like water) and advance to regular food, as tolerated.   Physical activities: Keep activities to a minimum for the first 8 hours after the procedure. After that, then as tolerated.  Driving: If you have received any sedation, be responsible and do not drive. You are not allowed to drive for 24 hours after having sedation.  Blood thinner: (Applies only to those taking blood thinners) You may restart your blood thinner 6 hours after your procedure.  Insulin: (Applies only to Diabetic patients taking insulin) As soon as you can eat, you may resume your normal dosing schedule.  Infection prevention: Keep procedure site clean and dry. Shower daily and clean area with soap and water.  Post-procedure Pain Diary: Extremely important that this be done correctly and accurately. Recorded information will be used to determine the next step in treatment. For the purpose of accuracy, follow these rules:  Evaluate only the area treated. Do not report or include pain from an untreated area.  For the purpose of this evaluation, ignore all other areas of pain, except for the treated area.  After your procedure, avoid taking a long nap and attempting to complete the pain diary after you wake up. Instead, set your alarm clock to go off every hour, on the hour, for the initial 8 hours after the procedure. Document the duration of the numbing medicine, and the relief you are getting from it.  Do not go to sleep and attempt to complete it later. It will not be accurate. If you received sedation, it is likely that you were given a medication that may cause amnesia. Because of this, completing the diary at a later time may cause the information to be inaccurate. This information is needed to plan your care.  Follow-up appointment: Keep your post-procedure follow-up evaluation appointment after the procedure (usually 2 weeks for most procedures, 6 weeks for radiofrequencies). DO NOT FORGET to bring you pain diary with you.   Expect: (What should I expect to see with my procedure?)  From numbing medicine (AKA: Local Anesthetics): Numbness or decrease in pain. You may also experience some weakness, which if present, could last for the duration of the local anesthetic.  Onset: Full effect within 15 minutes of injected.  Duration: It will depend on the type of local anesthetic used. On the average, 1 to 8 hours.   From steroids (Applies only if steroids were used): Decrease in swelling or inflammation. Once inflammation is improved, relief of the pain will follow.  Onset of benefits: Depends on the amount of swelling present. The more swelling, the longer it will take for the benefits to be  seen. In some cases, up to 10 days.  Duration: Steroids will stay in the system x 2 weeks. Duration of benefits will depend on multiple posibilities including persistent irritating factors.  Side-effects: If present, they may typically last 2 weeks (the duration of the steroids).  Frequent: Cramps (if they  occur, drink Gatorade and take over-the-counter Magnesium 450-500 mg once to twice a day); water retention with temporary weight gain; increases in blood sugar; decreased immune system response; increased appetite.  Occasional: Facial flushing (red, warm cheeks); mood swings; menstrual changes.  Uncommon: Long-term decrease or suppression of natural hormones; bone thinning. (These are more common with higher doses or more frequent use. This is why we prefer that our patients avoid having any injection therapies in other practices.)   Very Rare: Severe mood changes; psychosis; aseptic necrosis.  From procedure: Some discomfort is to be expected once the numbing medicine wears off. This should be minimal if ice and heat are applied as instructed.  Call if: (When should I call?)  You experience numbness and weakness that gets worse with time, as opposed to wearing off.  New onset bowel or bladder incontinence. (Applies only to procedures done in the spine)  Emergency Numbers:  Durning business hours (Monday - Thursday, 8:00 AM - 4:00 PM) (Friday, 9:00 AM - 12:00 Noon): [phone]  After hours: [phone]  NOTE: If you are having a problem and are unable connect with, or to talk to a provider, then go to your nearest urgent care or emergency department. If the problem is serious and urgent, please call 911. ____________________________________________________________________________________________

## 2019-12-24 ENCOUNTER — Telehealth: Payer: Self-pay | Admitting: *Deleted

## 2019-12-24 NOTE — Telephone Encounter (Signed)
Temp taken at 11am 98.2. No new complaints. OK for her to refill Gabapentin early per Dr. Holley Raring due to she cannot find the current bottle. Instructed to call prn any concerns or issues.

## 2019-12-24 NOTE — Telephone Encounter (Signed)
States had a HA during the night and her husband check her temp  About 1AM and states it was 101.4. She then took some Ibuprofen and went back to sleep. She took Tylenol at 0700 without rechecking her temp. I had her retake her temp and it was 98.6 with improving HA. Denies any other concerning symptoms. I instructed her to retake her temp at 11AM and call if it is above 100.0. She understands all instructions.

## 2020-01-04 ENCOUNTER — Encounter: Payer: Self-pay | Admitting: Family Medicine

## 2020-01-05 ENCOUNTER — Other Ambulatory Visit: Payer: Self-pay | Admitting: Family Medicine

## 2020-01-05 DIAGNOSIS — R61 Generalized hyperhidrosis: Secondary | ICD-10-CM

## 2020-01-05 DIAGNOSIS — R6889 Other general symptoms and signs: Secondary | ICD-10-CM

## 2020-01-06 ENCOUNTER — Other Ambulatory Visit
Admission: RE | Admit: 2020-01-06 | Discharge: 2020-01-06 | Disposition: A | Discharge status: Home / Self Care | Payer: 59 | Source: Ambulatory Visit | Attending: Family Medicine | Admitting: Family Medicine

## 2020-01-06 DIAGNOSIS — R6889 Other general symptoms and signs: Secondary | ICD-10-CM | POA: Insufficient documentation

## 2020-01-06 DIAGNOSIS — R61 Generalized hyperhidrosis: Secondary | ICD-10-CM | POA: Insufficient documentation

## 2020-01-06 LAB — TSH: TSH: 1.117 u[IU]/mL (ref 0.350–4.500)

## 2020-01-20 ENCOUNTER — Other Ambulatory Visit: Payer: Self-pay

## 2020-01-20 ENCOUNTER — Encounter: Payer: Self-pay | Admitting: Student in an Organized Health Care Education/Training Program

## 2020-01-20 ENCOUNTER — Ambulatory Visit
Payer: 59 | Attending: Student in an Organized Health Care Education/Training Program | Admitting: Student in an Organized Health Care Education/Training Program

## 2020-01-20 VITALS — BP 136/92 | HR 91 | Temp 97.9°F | Resp 20 | Ht 61.0 in | Wt 225.0 lb

## 2020-01-20 DIAGNOSIS — M47816 Spondylosis without myelopathy or radiculopathy, lumbar region: Secondary | ICD-10-CM | POA: Diagnosis not present

## 2020-01-20 DIAGNOSIS — M533 Sacrococcygeal disorders, not elsewhere classified: Secondary | ICD-10-CM | POA: Insufficient documentation

## 2020-01-20 DIAGNOSIS — G894 Chronic pain syndrome: Secondary | ICD-10-CM | POA: Diagnosis not present

## 2020-01-20 NOTE — Progress Notes (Signed)
PROVIDER NOTE: Information contained herein reflects review and annotations entered in association with encounter. Interpretation of such information and data should be left to medically-trained personnel. Information provided to patient can be located elsewhere in the medical record under "Patient Instructions". Document created using STT-dictation technology, any transcriptional errors that may result from process are unintentional.    Patient: Marie Vaughan  Service Category: E/M  Provider: Gillis Santa, MD  DOB: 12-03-1970  DOS: 01/20/2020  Specialty: Interventional Pain Management  MRN: [id number]  Setting: Ambulatory outpatient  PCP: Steele Sizer, MD  Type: Established Patient    Referring Provider: Steele Sizer, MD  Location: Office  Delivery: Face-to-face     HPI  Reason for encounter: Marie Vaughan, a 49 y.o. year old female, is here today for evaluation and management of her Sacroiliac joint pain [M53.3]. Ms. Taunton primary complain today is Back Pain (right, lower) Last encounter: Practice (12/24/2019). My last encounter with her was on 12/23/2019. Pertinent problems: Marie Vaughan has Obesity; Migraine with aura and without status migrainosus; OSA on CPAP; Metabolic syndrome; Lumbar facet arthropathy; Chronic pain syndrome; and Sacroiliac joint pain (RIGHT) on their pertinent problem list. Pain Assessment: Severity of Chronic pain is reported as a 2 /10. Location: Back Right, Lower/right buttock. When right leg is moved, the pain in the right lower back/buttock becomes worse. Onset: 1 to 4 weeks ago. Quality: Sore, Other (Comment) (deep). Timing: Intermittent. Modifying factor(s): rest, stillness. Vitals:  height is _0  (1.549 m) and weight is 225 lb (102.1 kg). Her temporal temperature is 97.9 F (36.6 C). Her blood pressure is 136/92 (abnormal) and her pulse is 91. Her respiration is 20 and oxygen saturation is 99%.    Post-Procedure Evaluation  Procedure:   Type: Lumbar Facet,  Medial Branch Block(s) #1  Primary Purpose: Diagnostic Region: Posterolateral Lumbosacral Spine Level:  L3, L4, L5, Medial Branch Level(s). Injecting these levels blocks the L3-4, L4-5 lumbar facet joints. Laterality: Bilateral  Sedation: Please see nurses note.  Effectiveness during initial hour after procedure(Ultra-Short Term Relief): 100 %  Local anesthetic used: Long-acting (4-6 hours) Effectiveness: Defined as any analgesic benefit obtained secondary to the administration of local anesthetics. This carries significant diagnostic value as to the etiological location, or anatomical origin, of the pain. Duration of benefit is expected to coincide with the duration of the local anesthetic used.  Effectiveness during initial 4-6 hours after procedure(Short-Term Relief):  (does not remember, went to sleep)   Long-term benefit: Defined as any relief past the pharmacologic duration of the local anesthetics.  Effectiveness past the initial 6 hours after procedure(Long-Term Relief): 90 %   Current benefits: Defined as benefit that persist at this time.   Analgesia:  >50% relief Function: Marie Vaughan reports improvement in function ROM: Marie Vaughan reports improvement in ROM   ROS  Constitutional: Denies any fever or chills Gastrointestinal: No reported hemesis, hematochezia, vomiting, or acute GI distress Musculoskeletal: Denies any acute onset joint swelling, redness, loss of ROM, or weakness Neurological: No reported episodes of acute onset apraxia, aphasia, dysarthria, agnosia, amnesia, paralysis, loss of coordination, or loss of consciousness  Medication Review  B-12, Ubrogepant, aspirin EC, diclofenac sodium, escitalopram, ferrous sulfate, gabapentin, hydrOXYzine, omeprazole, potassium chloride, tiZANidine, and valsartan-hydrochlorothiazide  History Review  Allergy: Marie Vaughan is allergic to chocolate, chocolate flavor, peanut-containing drug products, and pineapple. Drug: Marie Vaughan   reports no history of drug use. Alcohol:  reports no history of alcohol use. Tobacco:  reports that she has never  smoked. She has never used smokeless tobacco. Social: Marie Vaughan  reports that she has never smoked. She has never used smokeless tobacco. She reports that she does not drink alcohol and does not use drugs. Medical:  has a past medical history of Acne, Acute bronchitis, Allergy, Angio-edema, Anxiety, Anxiety state, unspecified, Cervicalgia, Contact lens/glasses fitting, Dysmenorrhea, GERD (gastroesophageal reflux disease), History of cardiac cath, History of stress test, HTN (hypertension), Iron deficiency anemia secondary to blood loss (chronic), Migraine with aura, without mention of intractable migraine without mention of status migrainosus, Obesity, Other disorders of bone and cartilage(733.99), Pain in thoracic spine, Pharyngitis, Tear of medial meniscus of left knee, Tear of medial meniscus of right knee, Vaginal delivery, and Wears contact lenses. Surgical: Marie Vaughan  has a past surgical history that includes Ovarian cyst removal (04-25-10); Abdominal hysterectomy (04-25-10); Knee arthroscopy with medial menisectomy (Right, 06/10/2013); Knee arthroscopy (Left, 06/10/2013); Dilation and curettage of uterus; Cardiac catheterization (04/2014); Colonoscopy with propofol (N/A, 07/11/2017); Esophagogastroduodenoscopy (egd) with propofol (N/A, 07/11/2017); and Givens capsule study (N/A, 09/23/2017). Family: family history includes Alcohol abuse in her maternal uncle and another family member; Arthritis in an other family member; Diabetes in her maternal aunt and mother; Early death in her mother; Early death (age of onset: 68) in her sister; Gout in her mother; Heart disease in an other family member; Hyperlipidemia in her maternal aunt; Hypertension in her maternal aunt and mother; Kidney failure in her mother; Other in an other family member; Prostate cancer in her maternal uncle.  Laboratory  Chemistry Profile   Renal Lab Results  Component Value Date   BUN 10 10/29/2019   CREATININE 0.64 10/29/2019   BCR 13 12/07/2015   GFRAA >60 10/29/2019   GFRNONAA >60 10/29/2019     Hepatic Lab Results  Component Value Date   AST 20 10/29/2019   ALT 22 10/29/2019   ALBUMIN 4.1 10/29/2019   ALKPHOS 82 10/29/2019   LIPASE 35 04/22/2015     Electrolytes Lab Results  Component Value Date   NA 137 10/29/2019   K 3.4 (L) 10/29/2019   CL 104 10/29/2019   CALCIUM 9.6 10/29/2019   MG 2.1 06/28/2017     Bone No results found for: VD25OH, VD125OH2TOT, JK0938HW2, XH3716RC7, 25OHVITD1, 25OHVITD2, 25OHVITD3, TESTOFREE, TESTOSTERONE   Inflammation (CRP: Acute Phase) (ESR: Chronic Phase) Lab Results  Component Value Date   CRP 0.8 12/12/2015   ESRSEDRATE 41 (H) 12/12/2015   LATICACIDVEN 0.9 12/10/2015       Note: Above Lab results reviewed.  Recent Imaging Review  DG PAIN CLINIC C-ARM 1-60 MIN NO REPORT Fluoro was used, but no Radiologist interpretation will be provided.  Please refer to "NOTES" tab for provider progress note. Note: Reviewed        Physical Exam  General appearance: Well nourished, well developed, and well hydrated. In no apparent acute distress Mental status: Alert, oriented x 3 (person, place, & time)       Respiratory: No evidence of acute respiratory distress Eyes: PERLA Vitals: BP (!) 136/92   Pulse 91   Temp 97.9 F (36.6 C) (Temporal)   Resp 20   Ht _0  (1.549 m)   Wt 225 lb (102.1 kg)   SpO2 99%   BMI 42.51 kg/m  BMI: Estimated body mass index is 42.51 kg/m as calculated from the following:   Height as of this encounter: _1  (1.549 m).   Weight as of this encounter: 225 lb (102.1 kg). Ideal: Ideal body weight: 47.8  kg (105 lb 6.1 oz) Adjusted ideal body weight: 69.5 kg (153 lb 3.6 oz)  Lumbar Spine Area Exam  Skin & Axial Inspection: No masses, redness, or swelling Alignment: Symmetrical Functional ROM: Improved after treatment        Stability: No instability detected Muscle Tone/Strength: Functionally intact. No obvious neuro-muscular anomalies detected. Sensory (Neurological): Musculoskeletal pain pattern Palpation: No palpable anomalies       Provocative Tests: Hyperextension/rotation test: Improved after treatment       Lumbar quadrant test (Kemp's test): deferred today       Lateral bending test: deferred today       Patrick's Maneuver: (+) for right-sided S-I arthralgia             FABER* test: (+) for right-sided S-I arthralgia             S-I anterior distraction/compression test: deferred today         S-I lateral compression test: (+)   S-I arthralgia/arthropathy S-I Thigh-thrust test: deferred today         S-I Gaenslen's test: deferred today         *(Flexion, ABduction and External Rotation) Gait & Posture Assessment  Ambulation: Unassisted Gait: Relatively normal for age and body habitus Posture: WNL  Lower Extremity Exam    Side: Right lower extremity  Side: Left lower extremity  Stability: No instability observed          Stability: No instability observed          Skin & Extremity Inspection: Skin color, temperature, and hair growth are WNL. No peripheral edema or cyanosis. No masses, redness, swelling, asymmetry, or associated skin lesions. No contractures.  Skin & Extremity Inspection: Skin color, temperature, and hair growth are WNL. No peripheral edema or cyanosis. No masses, redness, swelling, asymmetry, or associated skin lesions. No contractures.  Functional ROM: Unrestricted ROM                  Functional ROM: Unrestricted ROM                  Muscle Tone/Strength: Functionally intact. No obvious neuro-muscular anomalies detected.  Muscle Tone/Strength: Functionally intact. No obvious neuro-muscular anomalies detected.  Sensory (Neurological): Unimpaired        Sensory (Neurological): Unimpaired        DTR: Patellar: deferred today Achilles: deferred today Plantar: deferred today   DTR: Patellar: deferred today Achilles: deferred today Plantar: deferred today  Palpation: No palpable anomalies  Palpation: No palpable anomalies    Assessment   Status Diagnosis  Having a Flare-up Responding Responding 1. Sacroiliac joint pain (RIGHT)   2. Lumbar facet arthropathy   3. Lumbar spondylosis   4. Chronic pain syndrome      Updated Problems: Problem  Sacroiliac joint pain (RIGHT)  Lumbar Facet Arthropathy  Chronic Pain Syndrome  Metabolic Syndrome  Osa On Cpap  Migraine With Aura and Without Status Migrainosus  Obesity    Plan of Care  Ms. MALON SIDDALL has a current medication list which includes the following long-term medication(s): escitalopram, ferrous sulfate, gabapentin, omeprazole, potassium chloride, and valsartan-hydrochlorothiazide.   Patient follows up today for postprocedural evaluation status post bilateral L3, L4, L5 lumbar medial branch nerve block performed on 12/23/2019.  Patient is endorsing benefit in regards to her axial low back pain after the block.  She states that she is able to rotate and perform ADLs and less pain.  She states that she is also able  to go from a sitting to standing position without as much pain.  While her low back pain has improved, she is endorsing more inferior right buttock pain overlying her SI joint.  Positive Patrick's and Faber's test above to suggest SI joint dysfunction.  Given her right buttock pain that radiates slightly anteriorly, recommend diagnostic right sacroiliac joint injection with sedation.  Patient is also requesting her handicap sticker to be renewed which I will do for her today.  In regards to her low back pain, she is pleased with the pain relief that she has experienced from her lumbar facet medial branch nerve blocks.  If her pain does return, we can consider repeating facet medial branch nerve blocks again and even considering lumbar radiofrequency ablation or Sprint peripheral nerve stimulation of  lumbar medial branch.  Patient endorsed understanding.  Orders:  Orders Placed This Encounter  Procedures  . SACROILIAC JOINT INJECTION    Standing Status:   Future    Standing Expiration Date:   02/19/2020    Scheduling Instructions:     Side: RIGHT     Sedation: with.     Timeframe: ASAP    Order Specific Question:   Where will this procedure be performed?    Answer:   [facility]   Follow-up plan:   Return in about 3 weeks (around 02/10/2020) for R SI-J with sedation.     Status post L3, L4, L5 lumbar facet medial branch nerve block on 12/23/2019    Recent Visits Date Type Provider Dept  12/23/19 Procedure visit Gillis Santa, MD [facility]  12/08/19 Office Visit Gillis Santa, MD [facility]  Showing recent visits within past 90 days and meeting all other requirements Today's Visits Date Type Provider Dept  01/20/20 Office Visit Gillis Santa, MD [facility]  Showing today's visits and meeting all other requirements Future Appointments Date Type Provider Dept  02/10/20 Appointment Gillis Santa, MD [facility]  Showing future appointments within next 90 days and meeting all other requirements  I discussed the assessment and treatment plan with the patient. The patient was provided an opportunity to ask questions and all were answered. The patient agreed with the plan and demonstrated an understanding of the instructions.  Patient advised to call back or seek an in-person evaluation if the symptoms or condition worsens.  Duration of encounter: 86mnutes.  Note by: BGillis Santa MD Date: 01/20/2020; Time: 3:32 PM

## 2020-01-20 NOTE — Patient Instructions (Signed)

## 2020-01-20 NOTE — Progress Notes (Signed)
Safety precautions to be maintained throughout the outpatient stay will include: orient to surroundings, keep bed in low position, maintain call bell within reach at all times, provide assistance with transfer out of bed and ambulation.  

## 2020-02-10 ENCOUNTER — Ambulatory Visit: Payer: 59 | Admitting: Student in an Organized Health Care Education/Training Program

## 2020-02-15 ENCOUNTER — Ambulatory Visit (INDEPENDENT_AMBULATORY_CARE_PROVIDER_SITE_OTHER): Payer: 59

## 2020-02-15 DIAGNOSIS — E538 Deficiency of other specified B group vitamins: Secondary | ICD-10-CM

## 2020-02-15 MED ORDER — CYANOCOBALAMIN 1000 MCG/ML IJ SOLN
1000.0000 ug | Freq: Once | INTRAMUSCULAR | Status: AC
  Administered 2020-02-15: 1000 ug via INTRAMUSCULAR

## 2020-03-02 ENCOUNTER — Other Ambulatory Visit: Payer: Self-pay

## 2020-03-02 ENCOUNTER — Encounter: Payer: Self-pay | Admitting: Student in an Organized Health Care Education/Training Program

## 2020-03-02 ENCOUNTER — Ambulatory Visit (HOSPITAL_BASED_OUTPATIENT_CLINIC_OR_DEPARTMENT_OTHER): Payer: 59 | Admitting: Student in an Organized Health Care Education/Training Program

## 2020-03-02 ENCOUNTER — Ambulatory Visit
Admission: RE | Admit: 2020-03-02 | Discharge: 2020-03-02 | Disposition: A | Discharge status: Home / Self Care | Payer: 59 | Source: Ambulatory Visit | Attending: Student in an Organized Health Care Education/Training Program | Admitting: Student in an Organized Health Care Education/Training Program

## 2020-03-02 VITALS — BP 142/87 | HR 91 | Temp 97.3°F | Resp 20 | Ht 62.0 in | Wt 221.0 lb

## 2020-03-02 DIAGNOSIS — M25551 Pain in right hip: Secondary | ICD-10-CM | POA: Diagnosis not present

## 2020-03-02 DIAGNOSIS — M533 Sacrococcygeal disorders, not elsewhere classified: Secondary | ICD-10-CM

## 2020-03-02 DIAGNOSIS — G894 Chronic pain syndrome: Secondary | ICD-10-CM | POA: Diagnosis not present

## 2020-03-02 MED ORDER — DIAZEPAM 5 MG PO TABS
5.0000 mg | ORAL_TABLET | Freq: Once | ORAL | Status: AC
  Administered 2020-03-02: 5 mg via ORAL

## 2020-03-02 MED ORDER — LIDOCAINE HCL 2 % IJ SOLN
20.0000 mL | Freq: Once | INTRAMUSCULAR | Status: AC
  Administered 2020-03-02: 200 mg
  Filled 2020-03-02: qty 10

## 2020-03-02 MED ORDER — IOHEXOL 180 MG/ML  SOLN
10.0000 mL | Freq: Once | INTRAMUSCULAR | Status: AC
  Administered 2020-03-02: 10 mL via INTRA_ARTICULAR
  Filled 2020-03-02: qty 20

## 2020-03-02 MED ORDER — ROPIVACAINE HCL 2 MG/ML IJ SOLN
4.0000 mL | Freq: Once | INTRAMUSCULAR | Status: AC
  Administered 2020-03-02: 10 mL via INTRA_ARTICULAR
  Filled 2020-03-02: qty 10

## 2020-03-02 MED ORDER — METHYLPREDNISOLONE ACETATE 40 MG/ML IJ SUSP
40.0000 mg | Freq: Once | INTRAMUSCULAR | Status: AC
  Administered 2020-03-02: 40 mg via INTRA_ARTICULAR
  Filled 2020-03-02: qty 1

## 2020-03-02 NOTE — Progress Notes (Signed)
Safety precautions to be maintained throughout the outpatient stay will include: orient to surroundings, keep bed in low position, maintain call bell within reach at all times, provide assistance with transfer out of bed and ambulation.  

## 2020-03-02 NOTE — Progress Notes (Signed)
PROVIDER NOTE: Information contained herein reflects review and annotations entered in association with encounter. Interpretation of such information and data should be left to medically-trained personnel. Information provided to patient can be located elsewhere in the medical record under "Patient Instructions". Document created using STT-dictation technology, any transcriptional errors that may result from process are unintentional.    Patient: Marie Vaughan  Service Category: Procedure  Provider: Gillis Santa, MD  DOB: 04-07-1971  DOS: 03/02/2020  Location: [facility]  MRN: [id number]  Setting: Ambulatory - outpatient  Referring Provider: Steele Sizer, MD  Type: Established Patient  Specialty: Interventional Pain Management  PCP: Steele Sizer, MD   Primary Reason for Visit: Interventional Pain Management Treatment. CC: Hip Pain (right)  Procedure:          Anesthesia, Analgesia, Anxiolysis:  Type: Diagnostic Sacroiliac Joint Steroid Injection #1  Region: Inferior Lumbosacral Region Level: PIIS (Posterior Inferior Iliac Spine) Laterality: Right-Side  Type: Local Anesthesia with PO valium  Local Anesthetic: Lidocaine 1-2%  Position: Prone           Indications: 1. Sacroiliac joint pain (RIGHT)   2. Chronic pain syndrome    Pain Score: Pre-procedure: 5 /10 Post-procedure: 0-No pain/10   Pre-op Assessment:  Marie Vaughan is a 49 y.o. (year old), female patient, seen today for interventional treatment. She  has a past surgical history that includes Ovarian cyst removal (04-25-10); Abdominal hysterectomy (04-25-10); Knee arthroscopy with medial menisectomy (Right, 06/10/2013); Knee arthroscopy (Left, 06/10/2013); Dilation and curettage of uterus; Cardiac catheterization (04/2014); Colonoscopy with propofol (N/A, 07/11/2017); Esophagogastroduodenoscopy (egd) with propofol (N/A, 07/11/2017); and Givens capsule study (N/A, 09/23/2017). Marie Vaughan has a current medication list which  includes the following prescription(s): aspirin ec, b-12, diclofenac sodium, escitalopram, ferrous sulfate, gabapentin, hydroxyzine, omeprazole, potassium chloride, tizanidine, ubrelvy, and valsartan-hydrochlorothiazide. Her primarily concern today is the Hip Pain (right)  Initial Vital Signs:  Pulse/HCG Rate: 91ECG Heart Rate: 93 (nsr) Temp: (!) 97.3 F (36.3 C) Resp: 20 BP: (!) 122/96 SpO2: 100 %  BMI: Estimated body mass index is 40.42 kg/m as calculated from the following:   Height as of this encounter: 5\' 2"  (1.575 m).   Weight as of this encounter: 221 lb (100.2 kg).  Risk Assessment: Allergies: Reviewed. She is allergic to chocolate, chocolate flavor, peanut-containing drug products, and pineapple.  Allergy Precautions: None required Coagulopathies: Reviewed. None identified.  Blood-thinner therapy: None at this time Active Infection(s): Reviewed. None identified. Marie Vaughan is afebrile  Site Confirmation: Marie Vaughan was asked to confirm the procedure and laterality before marking the site Procedure checklist: Completed Consent: Before the procedure and under the influence of no sedative(s), amnesic(s), or anxiolytics, the patient was informed of the treatment options, risks and possible complications. To fulfill our ethical and legal obligations, as recommended by the American Medical Association's Code of Ethics, I have informed the patient of my clinical impression; the nature and purpose of the treatment or procedure; the risks, benefits, and possible complications of the intervention; the alternatives, including doing nothing; the risk(s) and benefit(s) of the alternative treatment(s) or procedure(s); and the risk(s) and benefit(s) of doing nothing. The patient was provided information about the general risks and possible complications associated with the procedure. These may include, but are not limited to: failure to achieve desired goals, infection, bleeding, organ or nerve  damage, allergic reactions, paralysis, and death. In addition, the patient was informed of those risks and complications associated to the procedure, such as failure to decrease pain; infection;  bleeding; organ or nerve damage with subsequent damage to sensory, motor, and/or autonomic systems, resulting in permanent pain, numbness, and/or weakness of one or several areas of the body; allergic reactions; (i.e.: anaphylactic reaction); and/or death. Furthermore, the patient was informed of those risks and complications associated with the medications. These include, but are not limited to: allergic reactions (i.e.: anaphylactic or anaphylactoid reaction(s)); adrenal axis suppression; blood sugar elevation that in diabetics may result in ketoacidosis or comma; water retention that in patients with history of congestive heart failure may result in shortness of breath, pulmonary edema, and decompensation with resultant heart failure; weight gain; swelling or edema; medication-induced neural toxicity; particulate matter embolism and blood vessel occlusion with resultant organ, and/or nervous system infarction; and/or aseptic necrosis of one or more joints. Finally, the patient was informed that Medicine is not an exact science; therefore, there is also the possibility of unforeseen or unpredictable risks and/or possible complications that may result in a catastrophic outcome. The patient indicated having understood very clearly. We have given the patient no guarantees and we have made no promises. Enough time was given to the patient to ask questions, all of which were answered to the patient's satisfaction. Marie Vaughan has indicated that she wanted to continue with the procedure. Attestation: I, the ordering provider, attest that I have discussed with the patient the benefits, risks, side-effects, alternatives, likelihood of achieving goals, and potential problems during recovery for the procedure that I have provided  informed consent. Date  Time: 03/02/2020  8:40 AM  Pre-Procedure Preparation:  Monitoring: As per clinic protocol. Respiration, ETCO2, SpO2, BP, heart rate and rhythm monitor placed and checked for adequate function Safety Precautions: Patient was assessed for positional comfort and pressure points before starting the procedure. Time-out: I initiated and conducted the "Time-out" before starting the procedure, as per protocol. The patient was asked to participate by confirming the accuracy of the "Time Out" information. Verification of the correct person, site, and procedure were performed and confirmed by me, the nursing staff, and the patient. "Time-out" conducted as per Joint Commission's Universal Protocol (UP.01.01.01). Time: 0906  Description of Procedure:          Target Area: Inferior, posterior, aspect of the sacroiliac fissure Approach: Posterior, paraspinal, ipsilateral approach. Area Prepped: Entire Lower Lumbosacral Region DuraPrep (Iodine Povacrylex [0.7% available iodine] and Isopropyl Alcohol, 74% w/w) Safety Precautions: Aspiration looking for blood return was conducted prior to all injections. At no point did we inject any substances, as a needle was being advanced. No attempts were made at seeking any paresthesias. Safe injection practices and needle disposal techniques used. Medications properly checked for expiration dates. SDV (single dose vial) medications used. Description of the Procedure: Protocol guidelines were followed. The patient was placed in position over the procedure table. The target area was identified and the area prepped in the usual manner. Skin & deeper tissues infiltrated with local anesthetic. Appropriate amount of time allowed to pass for local anesthetics to take effect. The procedure needle was advanced under fluoroscopic guidance into the sacroiliac joint until a firm endpoint was obtained. Proper needle placement secured. Negative aspiration confirmed.  Solution injected in intermittent fashion, asking for systemic symptoms every 0.5cc of injectate. The needles were then removed and the area cleansed, making sure to leave some of the prepping solution back to take advantage of its long term bactericidal properties. Vitals:   03/02/20 0845 03/02/20 0909 03/02/20 0911  BP: (!) 122/96 (!) 155/111 (!) 142/87  Pulse: 91  Resp:  20 20  Temp: (!) 97.3 F (36.3 C)    SpO2: 100% 96% 96%  Weight: 221 lb (100.2 kg)    Height: 5\' 2"  (1.575 m)      Start Time: 0907 hrs. End Time: 0911 hrs. Materials:  Needle(s) Type: Spinal Needle Gauge: 22G Length: 3.5-in Medication(s): Please see orders for medications and dosing details. 8 cc solution made of 7 cc of 0.2% ropivacaine, 1 cc of methylprednisolone, 40 mg/cc. Imaging Guidance (Non-Spinal):          Type of Imaging Technique: Fluoroscopy Guidance (Non-Spinal) Indication(s): Assistance in needle guidance and placement for procedures requiring needle placement in or near specific anatomical locations not easily accessible without such assistance. Exposure Time: Please see nurses notes. Contrast: Before injecting any contrast, we confirmed that the patient did not have an allergy to iodine, shellfish, or radiological contrast. Once satisfactory needle placement was completed at the desired level, radiological contrast was injected. Contrast injected under live fluoroscopy. No contrast complications. See chart for type and volume of contrast used. Fluoroscopic Guidance: I was personally present during the use of fluoroscopy. "Tunnel Vision Technique" used to obtain the best possible view of the target area. Parallax error corrected before commencing the procedure. "Direction-depth-direction" technique used to introduce the needle under continuous pulsed fluoroscopy. Once target was reached, antero-posterior, oblique, and lateral fluoroscopic projection used confirm needle placement in all planes. Images  permanently stored in EMR. Interpretation: I personally interpreted the imaging intraoperatively. Adequate needle placement confirmed in multiple planes. Appropriate spread of contrast into desired area was observed. No evidence of afferent or efferent intravascular uptake. Permanent images saved into the patient's record.  Antibiotic Prophylaxis:   Anti-infectives (From admission, onward)   None     Indication(s): None identified  Post-operative Assessment:  Post-procedure Vital Signs:  Pulse/HCG Rate: 9195 Temp: (!) 97.3 F (36.3 C) Resp: 20 BP: (!) 142/87 SpO2: 96 %  EBL: None  Complications: No immediate post-treatment complications observed by team, or reported by patient.  Note: The patient tolerated the entire procedure well. A repeat set of vitals were taken after the procedure and the patient was kept under observation following institutional policy, for this type of procedure. Post-procedural neurological assessment was performed, showing return to baseline, prior to discharge. The patient was provided with post-procedure discharge instructions, including a section on how to identify potential problems. Should any problems arise concerning this procedure, the patient was given instructions to immediately contact us, at any time, without hesitation. In any case, we plan to contact the patient by telephone for a follow-up status report regarding this interventional procedure.  Comments:  No additional relevant information.  Plan of Care  Orders:  Orders Placed This Encounter  Procedures  . DG PA[facility] C-ARM 1-60 MIN NO REPORT    Intraoperative interpretation by procedural physician at [place].    Standing Status:   Standing    Number of Occurrences:   1    Order Specific Question:   Reason for exam:    Answer:   Assistance in needle guidance and placement for procedures requiring needle placement in or near specific anatomical locations not easily  accessible without such assistance.   Medications ordered for procedure: Meds ordered this encounter  Medications  . iohexol (OMNIPAQUE) 180 MG/ML injection 10 mL    Must be Myelogram-compatible. If not available, you may substitute with a water-soluble, non-ionic, hypoallergenic, myelogram-compatible radiological contrast medium.  Marland Kitchen lidocaine (XYLOCAINE) 2 % (with pres) injection  400 mg  . methylPREDNISolone acetate (DEPO-MEDROL) injection 40 mg  . ropivacaine (PF) 2 mg/mL (0.2%) (NAROPIN) injection 4 mL  . diazepam (VALIUM) tablet 5 mg   Medications administered: We administered iohexol, lidocaine, methylPREDNISolone acetate, ropivacaine (PF) 2 mg/mL (0.2%), and diazepam.  See the medical record for exact dosing, route, and time of administration.  Follow-up plan:   Return in about 3 weeks (around 03/23/2020) for Post Procedure Evaluation, virtual.      Status post L3, L4, L5 lumbar facet medial branch nerve block on 12/23/2019, right SI-J 03/02/20      Recent Visits Date Type Provider Dept  01/20/20 Office Visit Gillis Santa, MD [facility]  12/23/19 Procedure visit Gillis Santa, MD [facility]  12/08/19 Office Visit Gillis Santa, MD [facility]  Showing recent visits within past 90 days and meeting all other requirements Today's Visits Date Type Provider Dept  03/02/20 Procedure visit Gillis Santa, MD [facility]  Showing today's visits and meeting all other requirements Future Appointments Date Type Provider Dept  03/30/20 Appointment Gillis Santa, MD [facility]  Showing future appointments within next 90 days and meeting all other requirements  Disposition: Discharge home  Discharge (Date  Time): 03/02/2020; 0920 hrs.   Primary Care Physician: Steele Sizer, MD Location: [facility] Note by: Gillis Santa, MD Date: 03/02/2020; Time: 9:52 AM  Disclaimer:  Medicine is not an exact science.  The only guarantee in medicine is that nothing is guaranteed. It is important to note that the decision to proceed with this intervention was based on the information collected from the patient. The Data and conclusions were drawn from the patient's questionnaire, the interview, and the physical examination. Because the information was provided in large part by the patient, it cannot be guaranteed that it has not been purposely or unconsciously manipulated. Every effort has been made to obtain as much relevant data as possible for this evaluation. It is important to note that the conclusions that lead to this procedure are derived in large part from the available data. Always take into account that the treatment will also be dependent on availability of resources and existing treatment guidelines, considered by other Pain Management Practitioners as being common knowledge and practice, at the time of the intervention. For Medico-Legal purposes, it is also important to point out that variation in procedural techniques and pharmacological choices are the acceptable norm. The indications, contraindications, technique, and results of the above procedure should only be interpreted and judged by a Board-Certified Interventional Pain Specialist with extensive familiarity and expertise in the same exact procedure and technique.

## 2020-03-03 ENCOUNTER — Telehealth: Payer: Self-pay

## 2020-03-03 NOTE — Telephone Encounter (Signed)
Post procedure phone call.  LM 

## 2020-03-29 ENCOUNTER — Telehealth: Payer: Self-pay

## 2020-03-29 NOTE — Telephone Encounter (Signed)
Pt was called with no answer, message was left on her answering service.

## 2020-03-30 ENCOUNTER — Ambulatory Visit
Payer: 59 | Attending: Student in an Organized Health Care Education/Training Program | Admitting: Student in an Organized Health Care Education/Training Program

## 2020-03-30 ENCOUNTER — Other Ambulatory Visit: Payer: Self-pay

## 2020-03-30 DIAGNOSIS — M533 Sacrococcygeal disorders, not elsewhere classified: Secondary | ICD-10-CM

## 2020-03-30 NOTE — Progress Notes (Signed)
I attempted to call the patient however no response.  -Dr Wister Hoefle  

## 2020-05-19 ENCOUNTER — Encounter: Payer: Self-pay | Admitting: Family Medicine

## 2020-05-26 ENCOUNTER — Ambulatory Visit
Payer: Managed Care, Other (non HMO) | Attending: Student in an Organized Health Care Education/Training Program | Admitting: Physical Therapy

## 2020-06-15 NOTE — Progress Notes (Deleted)
Name: Marie Vaughan   MRN: [id number]    DOB: 1971-05-06   Date:06/15/2020       Progress Note  Subjective  Chief Complaint  Follow up   HPI History of iron deficiency anemia: she used to see Dr. Janese Banks, but lost to follow up. She denies  Pica., but has fatigue and SOB. Last ferritin was normal, she is down to one ferrous sulfate daily   Atypical Chest pain: she has seen cardiologist, pulmonologist and GI , she states all evaluation was negative. She continues to have some wheezing , sob with activity and chest pain intermittently even when sitting down. She needs to take deep breaths intermittently. Discussed referral back to pulmonologist of CT to rule out PE or lung pathology. Last imagining study was done 11/12/2019 - CT chest and it was normal. She states it starts with a sensation of fullness in her chest that makes her feel like she needs to burp. She tries drinking , stretching arms and if no improvement it progresses to getting sweaty , shaking hands and feels dizzy. She states it makes her feels drained like she needs to take a nap. Discussed possibly of anxiety and offered medication. She states when having CT scan she felt like she could not breath - started to sweat and radiology tech did some breathing exercise and she calmed down.   Chronic lower back pain with radiculitis: seeing Dr. Noemi Chapel and advised to see by Dr. Ron Agee for possible injections but she was afraid to go. She prefers seeing someone locally, we will place referral for Dr. Holley Raring. She has constant pain on lumbar spine that radiates to her abdomen and sometimes down right leg. She states willing to have a nerve block procedure to see if improves her back pain . She has radiculitis going down right foot   MRI lumbar spine done 07/22/2019 showed L5-S1 facet arthropathy, small right paracentral annular fissure, also facet diease at other levels and mild disc bulging. Keep follow up with Dr. Holley Raring   HTN: shehas been  compliant with medication, taking Valsartan hctz sine Summer of 2020 and is doing well, no side effects of medications, potassium has been slightly low, she does not have a high potassium diet and we will add supplementation   B12 deficiency: taking otc medication but B12 is still low we will start B12 injections  Migraine/vestibular: Shehas not been taking Topamax - she did not like sedation side effect  , she lost to follow up withDr. Manuella Ghazi. Given Imitrex but gets very groggy on it,  Taking Roselyn Meier now but still needs to take a nap once she takes it She continues to have episodes of dizziness, on average she has at least one migraine weekly .  OSA: moderate to severe based on sleep study, started on CPAP at 9 cm H2O July 2017,she has not been wearing her CPAP machine.. Explained importance of resuming CPAP   Metabolic syndrome: she denies polyphagia,but has polydipsia, she has episodes of polyuria ( usually when taking HCTZ),  Morbid obesity she has BMI above 40 and also has co-morbidities such as HTN, GERD and OSA   GERD: seen by GI, she still has daily symptoms, trying to avoid trigger foods. Taking Omeprazole twice daily, explained that bolus sensation my be from anxiety   *** Patient Active Problem List   Diagnosis Date Noted  . Sacroiliac joint pain (RIGHT) 01/20/2020  . Lumbar facet arthropathy 12/08/2019  . Chronic pain syndrome 12/08/2019  . History of  ovarian cyst 03/05/2018  . Left ovarian cyst 03/05/2018  . History of hysterectomy 01/31/2018  . Colon cancer screening   . Polyp of sigmoid colon   . Benign neoplasm of transverse colon   . Iron deficiency anemia   . Metabolic syndrome 05/39/7673  . OSA on CPAP 02/03/2016  . 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 Abnormal brain MRI 12/20/2015  . Dizziness 12/13/2015  . Near syncope 12/10/2015  . Atypical chest pain 08/22/2013  . Essential hypertension 08/22/2013  . Angio-edema   . GERD (gastroesophageal reflux  disease)   . Migraine with aura and without status migrainosus   . Acne   . Allergic rhinitis 10/05/2011  . Insomnia 08/30/2011  . Obesity 08/30/2011    Past Surgical History:  Procedure Laterality Date  . ABDOMINAL HYSTERECTOMY  04-25-10   Due to abnormal PAP  . CARDIAC CATHETERIZATION  04/2014   [facility]  . COLONOSCOPY WITH PROPOFOL N/A 07/11/2017   Procedure: COLONOSCOPY WITH PROPOFOL;  Surgeon: [name], MD;  Location: [place];  Service: Endoscopy;  Laterality: N/A;  . DILATION AND CURETTAGE OF UTERUS    . ESOPHAGOGASTRODUODENOSCOPY (EGD) WITH PROPOFOL N/A 07/11/2017   Procedure: ESOPHAGOGASTRODUODENOSCOPY (EGD) WITH PROPOFOL;  Surgeon: [name], MD;  Location: [place];  Service: Endoscopy;  Laterality: N/A;  diabetic - diet controlled  . GIVENS CAPSULE STUDY N/A 09/23/2017   Procedure: GIVENS CAPSULE STUDY;  Surgeon: Virgel Manifold, MD;  Location: [facility];  Service: Endoscopy;  Laterality: N/A;  . KNEE ARTHROSCOPY Left 06/10/2013   Procedure: LEFT KNEE ARTHROSCOPY KNEE WITH PARTIAL MEDIAL MENISCECTOMY ;  Surgeon: Lorn Junes, MD;  Location: [place];  Service: Orthopedics;  Laterality: Left;  xerofoam, 4x4's, webril, ace wrap, ice wrap  . KNEE ARTHROSCOPY WITH MEDIAL MENISECTOMY Right 06/10/2013   Procedure: RIGHT KNEE ARTHROSCOPY WITH MEDIAL MENISECTOMY;  Surgeon: Lorn Junes, MD;  Location: [place];  Service: Orthopedics;  Laterality: Right;  partial lateral menisectomoy and chondroplasty  . OVARIAN CYST REMOVAL  04-25-10    Family History  Problem Relation Age of Onset  . Early death Mother        Murdered when pt was 75 years old  . Diabetes Mother   . Hypertension Mother   . Gout Mother   . Kidney failure Mother   . Early death Sister 57       due to hemorage  . Alcohol abuse Maternal Uncle   . Alcohol abuse Other   . Arthritis Other   . Heart disease Other   . Other Other         cousin- phlebitis  . Diabetes Maternal Aunt   . Hyperlipidemia Maternal Aunt   . Hypertension Maternal Aunt   . Prostate cancer Maternal Uncle   . Breast cancer Neg Hx     Social History   Tobacco Use  . Smoking status: Never Smoker  . Smokeless tobacco: Never Used  Substance Use Topics  . Alcohol use: No    Alcohol/week: 0.0 standard drinks     Current Outpatient Medications:  .  fluticasone (FLONASE) 50 MCG/ACT nasal spray, Place into the nose., Disp: , Rfl:  .  aspirin EC 81 MG tablet, Take 1 tablet (81 mg total) by mouth daily., Disp: 90 tablet, Rfl: 3 .  benzonatate (TESSALON) 200 MG capsule, Take by mouth., Disp: , Rfl:  .  Cyanocobalamin (B-12) 1000 MCG SUBL, Place 1 tablet under the tongue daily., Disp: 90 each, Rfl:  1 .  diclofenac sodium (VOLTAREN) 1 % GEL, Apply 4 g topically 4 (four) times daily., Disp: 300 g, Rfl: 0 .  escitalopram (LEXAPRO) 10 MG tablet, Take 1 tablet (10 mg total) by mouth daily., Disp: 30 tablet, Rfl: 1 .  famotidine (PEPCID) 20 MG tablet, Take by mouth., Disp: , Rfl:  .  ferrous sulfate 324 (65 Fe) MG TBEC, Take 1 tablet (325 mg total) by mouth daily., Disp: 90 tablet, Rfl: 1 .  fluticasone (FLONASE) 50 MCG/ACT nasal spray, Place into both nostrils., Disp: , Rfl:  .  gabapentin (NEURONTIN) 300 MG capsule, Take 1 capsule (300 mg total) by mouth at bedtime., Disp: 30 capsule, Rfl: 1 .  hydrOXYzine (ATARAX/VISTARIL) 10 MG tablet, Take 1 tablet (10 mg total) by mouth 3 (three) times daily as needed., Disp: 30 tablet, Rfl: 0 .  omeprazole (PRILOSEC) 40 MG capsule, Take 1 capsule (40 mg total) by mouth 2 (two) times daily., Disp: 180 capsule, Rfl: 1 .  potassium chloride (MICRO-K) 10 MEQ CR capsule, Take 1 capsule (10 mEq total) by mouth daily., Disp: 90 capsule, Rfl: 1 .  tiZANidine (ZANAFLEX) 4 MG tablet, Take 1 tablet (4 mg total) by mouth daily as needed for muscle spasms., Disp: 30 tablet, Rfl: 1 .  Ubrogepant (UBRELVY) 50 MG TABS, Take 1-2 tablets  by mouth daily as needed., Disp: 10 tablet, Rfl: 2 .  valsartan-hydrochlorothiazide (DIOVAN-HCT) 160-12.5 MG tablet, Take 1 tablet by mouth daily., Disp: 90 tablet, Rfl: 1  Allergies  Allergen Reactions  . Chocolate     Hives, itching  . Chocolate Flavor Other (See Comments)    Hives, itching  . Peanut-Containing Drug Products Swelling    cashews  . Pineapple     I personally reviewed {Reviewed:14835} with the patient/caregiver today.   ROS  ***  Objective  There were no vitals filed for this visit.  There is no height or weight on file to calculate BMI.  Physical Exam ***  No results found for this or any previous visit (from the past 2160 hour(s)).  Diabetic Foot Exam: Diabetic Foot Exam - Simple   No data filed     ***  PHQ2/9: Depression screen [facility] 2/9 12/23/2019 12/16/2019 12/08/2019 10/29/2019 09/18/2019  Decreased Interest 0 0 0 0 0  Down, Depressed, Hopeless 0 0 0 0 0  PHQ - 2 Score 0 0 0 0 0  Altered sleeping - 0 2 0 0  Tired, decreased energy - 0 1 0 0  Change in appetite - 0 1 0 0  Feeling bad or failure about yourself  - 0 0 0 0  Trouble concentrating - 0 0 0 0  Moving slowly or fidgety/restless - 0 0 0 0  Suicidal thoughts - 0 0 0 0  PHQ-9 Score - 0 4 0 0  Difficult doing work/chores - Not difficult at all - - -    phq 9 is {gen pos TJQ:300923} ***  Fall Risk: Fall Risk  03/02/2020 01/20/2020 12/23/2019 12/16/2019 12/08/2019  Falls in the past year? 0 0 0 0 1  Comment - - - - -  Number falls in past yr: - - - 0 0  Injury with Fall? - - - 0 0  Risk Factor Category  - - - - -  Risk for fall due to : - - - - Impaired balance/gait  Risk for fall due to: Comment - - - - -  Follow up - - - Falls evaluation completed -   ***  Functional Status Survey:   ***   Assessment & Plan  *** There are no diagnoses linked to this encounter. 

## 2020-06-20 ENCOUNTER — Ambulatory Visit: Payer: 59 | Admitting: Family Medicine

## 2020-06-20 DIAGNOSIS — Z1159 Encounter for screening for other viral diseases: Secondary | ICD-10-CM

## 2020-06-20 DIAGNOSIS — R739 Hyperglycemia, unspecified: Secondary | ICD-10-CM

## 2020-06-20 DIAGNOSIS — Z1231 Encounter for screening mammogram for malignant neoplasm of breast: Secondary | ICD-10-CM

## 2020-06-22 ENCOUNTER — Ambulatory Visit: Payer: 59

## 2020-06-22 ENCOUNTER — Encounter: Payer: Self-pay | Admitting: Family Medicine

## 2020-06-27 ENCOUNTER — Ambulatory Visit: Payer: 59

## 2020-06-29 ENCOUNTER — Ambulatory Visit: Payer: 59

## 2020-07-04 ENCOUNTER — Ambulatory Visit: Payer: 59

## 2020-07-06 ENCOUNTER — Ambulatory Visit: Payer: 59

## 2020-07-12 ENCOUNTER — Ambulatory Visit: Payer: 59

## 2020-07-13 ENCOUNTER — Ambulatory Visit: Payer: 59

## 2020-07-17 ENCOUNTER — Ambulatory Visit
Admission: EM | Admit: 2020-07-17 | Discharge: 2020-07-17 | Disposition: A | Discharge status: Home / Self Care | Payer: Managed Care, Other (non HMO) | Attending: Sports Medicine | Admitting: Sports Medicine

## 2020-07-17 ENCOUNTER — Other Ambulatory Visit: Payer: Self-pay

## 2020-07-17 ENCOUNTER — Encounter: Payer: Self-pay | Admitting: Emergency Medicine

## 2020-07-17 DIAGNOSIS — J069 Acute upper respiratory infection, unspecified: Secondary | ICD-10-CM | POA: Diagnosis not present

## 2020-07-17 DIAGNOSIS — U071 COVID-19: Secondary | ICD-10-CM | POA: Diagnosis not present

## 2020-07-17 LAB — RESP PANEL BY RT-PCR (FLU A&B, COVID) ARPGX2
Influenza A by PCR: NEGATIVE
Influenza B by PCR: NEGATIVE
SARS Coronavirus 2 by RT PCR: POSITIVE — AB

## 2020-07-17 NOTE — ED Provider Notes (Signed)
[facility]    CSN: [id number] Arrival date & time: 07/17/20  8270      History   Chief Complaint Chief Complaint  Patient presents with  . Nasal Congestion  . Headache    HPI Marie Vaughan is a 49 y.o. female.   Pleasant 49 year old female who presents for evaluation of 5 to 6 days of upper respiratory symptoms.  She says that she is having congestion facial pain pressure headache.  Denies ear pain sore throat shortness of breath or chest pain.  No history of asthma or smoking.  No significant fever shakes or chills.  No nausea vomiting or diarrhea.  She has been vaccinated against Covid and influenza.  She also has a sensation of drainage going down the back of her throat.  Also myalgias and she is feels unwell.  No red flag signs or symptoms.     Past Medical History:  Diagnosis Date  . Acne   . Acute bronchitis   . Allergy   . Angio-edema   . Anxiety   . Anxiety state, unspecified   . Cervicalgia   . Contact lens/glasses fitting    wears contacts or glasses  . Dysmenorrhea   . GERD (gastroesophageal reflux disease)   . History of cardiac cath    a. 04/29/2014: LM nl, LAD no dz, D1 no dz, D2 very small vessel, D3 very small vessel, LCx nl, OM1 very small vessel, OM2 medium sized vessel, OM 3 no dz, RCA no dz, PDA no dz   . History of stress test    a. Lexiscan 08/25/13: no sig ischemia, attenuation artifact noted, no sig WMA noted, EF 69%, no EKG changes concerning for ischemia  . HTN (hypertension)   . Iron deficiency anemia secondary to blood loss (chronic)   . Migraine with aura, without mention of intractable migraine without mention of status migrainosus    "vestibular" per pt  . Obesity   . 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 Pain in thoracic spine   . Pharyngitis   . Tear of medial meniscus of left knee   . Tear of medial meniscus of right knee   . Vaginal delivery    x 3  . Wears contact lenses     Patient Active Problem  List   Diagnosis Date Noted  . Sacroiliac joint pain (RIGHT) 01/20/2020  . Lumbar facet arthropathy 12/08/2019  . Chronic pain syndrome 12/08/2019  . History of ovarian cyst 03/05/2018  . Left ovarian cyst 03/05/2018  . History of hysterectomy 01/31/2018  . Colon cancer screening   . Polyp of sigmoid colon   . Benign neoplasm of transverse colon   . Iron deficiency anemia   . Metabolic syndrome 78/67/5449  . OSA on CPAP 02/03/2016  . 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 Abnormal brain MRI 12/20/2015  . Dizziness 12/13/2015  . Near syncope 12/10/2015  . Atypical chest pain 08/22/2013  . Essential hypertension 08/22/2013  . Angio-edema   . GERD (gastroesophageal reflux disease)   . Migraine with aura and without status migrainosus   . Acne   . Allergic rhinitis 10/05/2011  . Insomnia 08/30/2011  . Obesity 08/30/2011    Past Surgical History:  Procedure Laterality Date  . ABDOMINAL HYSTERECTOMY  04-25-10   Due to abnormal PAP  . CARDIAC CATHETERIZATION  04/2014   [facility]  . COLONOSCOPY WITH PROPOFOL N/A 07/11/2017   Procedure: COLONOSCOPY WITH PROPOFOL;  Surgeon: [name], MD;  Location: [place]  [facility];  Service: Endoscopy;  Laterality: N/A;  . DILATION AND CURETTAGE OF UTERUS    . ESOPHAGOGASTRODUODENOSCOPY (EGD) WITH PROPOFOL N/A 07/11/2017   Procedure: ESOPHAGOGASTRODUODENOSCOPY (EGD) WITH PROPOFOL;  Surgeon: [name], MD;  Location: [place];  Service: Endoscopy;  Laterality: N/A;  diabetic - diet controlled  . GIVENS CAPSULE STUDY N/A 09/23/2017   Procedure: GIVENS CAPSULE STUDY;  Surgeon: Virgel Manifold, MD;  Location: [facility];  Service: Endoscopy;  Laterality: N/A;  . KNEE ARTHROSCOPY Left 06/10/2013   Procedure: LEFT KNEE ARTHROSCOPY KNEE WITH PARTIAL MEDIAL MENISCECTOMY ;  Surgeon: Lorn Junes, MD;  Location: [place];  Service: Orthopedics;  Laterality: Left;  xerofoam, 4x4's, webril, ace wrap, ice wrap  . KNEE  ARTHROSCOPY WITH MEDIAL MENISECTOMY Right 06/10/2013   Procedure: RIGHT KNEE ARTHROSCOPY WITH MEDIAL MENISECTOMY;  Surgeon: Lorn Junes, MD;  Location: [place];  Service: Orthopedics;  Laterality: Right;  partial lateral menisectomoy and chondroplasty  . OVARIAN CYST REMOVAL  04-25-10    OB History    Gravida  4   Para  3   Term  3   Preterm  0   AB  1   Living  3     SAB      IAB      Ectopic      Multiple      Live Births               Home Medications    Prior to Admission medications   Medication Sig Start Date End Date Taking? Authorizing Provider  aspirin EC 81 MG tablet Take 1 tablet (81 mg total) by mouth daily. 04/21/14  Yes Dunn, Ryan M, PA-C  Cyanocobalamin (B-12) 1000 MCG SUBL Place 1 tablet under the tongue daily. 08/01/17  Yes Sowles, Drue Stager, MD  famotidine (PEPCID) 20 MG tablet Take by mouth.   Yes [provider]  ferrous sulfate 324 (65 Fe) MG TBEC Take 1 tablet (325 mg total) by mouth daily. 12/16/19  Yes Sowles, Drue Stager, MD  omeprazole (PRILOSEC) 40 MG capsule Take 1 capsule (40 mg total) by mouth 2 (two) times daily. 12/16/19  Yes Sowles, Drue Stager, MD  potassium chloride (MICRO-K) 10 MEQ CR capsule Take 1 capsule (10 mEq total) by mouth daily. 12/16/19  Yes Sowles, Drue Stager, MD  tiZANidine (ZANAFLEX) 4 MG tablet Take 1 tablet (4 mg total) by mouth daily as needed for muscle spasms. 12/08/19 12/07/20 Yes Gillis Santa, MD  valsartan-hydrochlorothiazide (DIOVAN-HCT) 160-12.5 MG tablet Take 1 tablet by mouth daily. 12/16/19  Yes Sowles, Drue Stager, MD  benzonatate (TESSALON) 200 MG capsule Take by mouth. 04/26/20   [provider]  diclofenac sodium (VOLTAREN) 1 % GEL Apply 4 g topically 4 (four) times daily. 04/15/19   Steele Sizer, MD  escitalopram (LEXAPRO) 10 MG tablet Take 1 tablet (10 mg total) by mouth daily. 12/16/19   Steele Sizer, MD  fluticasone (FLONASE) 50 MCG/ACT nasal spray Place into the nose. 04/26/20    [provider]  fluticasone (FLONASE) 50 MCG/ACT nasal spray Place into both nostrils. 05/10/20   [provider]  gabapentin (NEURONTIN) 300 MG capsule Take 1 capsule (300 mg total) by mouth at bedtime. 12/08/19   Gillis Santa, MD  hydrOXYzine (ATARAX/VISTARIL) 10 MG tablet Take 1 tablet (10 mg total) by mouth 3 (three) times daily as needed. 12/16/19   Sowles, Drue Stager, MD  Ubrogepant (UBRELVY) 50 MG TABS Take 1-2 tablets by mouth daily as needed. 09/18/19  Steele Sizer, MD    Family History Family History  Problem Relation Age of Onset  . Early death Mother        Murdered when pt was 43 years old  . Diabetes Mother   . Hypertension Mother   . Gout Mother   . Kidney failure Mother   . Early death Sister 57       due to hemorage  . Alcohol abuse Maternal Uncle   . Alcohol abuse Other   . Arthritis Other   . Heart disease Other   . Other Other        cousin- phlebitis  . Diabetes Maternal Aunt   . Hyperlipidemia Maternal Aunt   . Hypertension Maternal Aunt   . Prostate cancer Maternal Uncle   . Breast cancer Neg Hx     Social History Social History   Tobacco Use  . Smoking status: Never Smoker  . Smokeless tobacco: Never Used  Vaping Use  . Vaping Use: Never used  Substance Use Topics  . Alcohol use: No    Alcohol/week: 0.0 standard drinks  . Drug use: No     Allergies   Chocolate, Chocolate flavor, Peanut-containing drug products, and Pineapple   Review of Systems Review of Systems  Constitutional: Negative for activity change, appetite change, chills and fatigue.  HENT: Positive for congestion, postnasal drip, rhinorrhea and sinus pressure. Negative for ear pain and sore throat.   Respiratory: Positive for cough. Negative for chest tightness, shortness of breath, wheezing and stridor.   Cardiovascular: Negative for chest pain.  Gastrointestinal: Negative for abdominal pain.  Genitourinary: Negative for dysuria.  Musculoskeletal: Positive  for myalgias.  Skin: Negative for color change, pallor, rash and wound.  All other systems reviewed and are negative.    Physical Exam Triage Vital Signs ED Triage Vitals  Enc Vitals Group     BP 07/17/20 0853 (!) 130/96     Pulse Rate 07/17/20 0853 88     Resp 07/17/20 0853 16     Temp 07/17/20 0853 98.6 F (37 C)     Temp Source 07/17/20 0853 Oral     SpO2 07/17/20 0853 95 %     Weight 07/17/20 0848 218 lb (98.9 kg)     Height 07/17/20 0848 5\' 2"  (1.575 m)     Head Circumference --      Peak Flow --      Pain Score 07/17/20 0848 0     Pain Loc --      Pain Edu? --      Excl. in [place]? --    No data found.  Updated Vital Signs BP (!) 130/96 (BP Location: Right Arm)   Pulse 88   Temp 98.6 F (37 C) (Oral)   Resp 16   Ht 5\' 2"  (1.575 m)   Wt 98.9 kg   SpO2 95%   BMI 39.87 kg/m    Visual Acuity Right Eye Distance:   Left Eye Distance:   Bilateral Distance:    Right Eye Near:   Left Eye Near:    Bilateral Near:     Physical Exam Vitals and nursing note reviewed.  Constitutional:      General: She is not in acute distress.    Appearance: She is well-developed. She is not ill-appearing or toxic-appearing.  HENT:     Head: Normocephalic and atraumatic.     Mouth/Throat:     Mouth: Mucous membranes are moist.     Comments: Mild erythema  in the oropharynx Eyes:     Extraocular Movements: Extraocular movements intact.     Right eye: Normal extraocular motion and no nystagmus.     Left eye: Normal extraocular motion and no nystagmus.     Pupils: Pupils are equal, round, and reactive to light.  Cardiovascular:     Rate and Rhythm: Normal rate and regular rhythm.     Heart sounds: No murmur heard. No friction rub. No gallop.   Pulmonary:     Effort: Pulmonary effort is normal. No respiratory distress.     Breath sounds: Normal breath sounds. No stridor. No wheezing, rhonchi or rales.  Musculoskeletal:     Cervical back: No rigidity.  Lymphadenopathy:      Cervical: Cervical adenopathy present.  Skin:    General: Skin is warm and dry.     Capillary Refill: Capillary refill takes less than 2 seconds.  Neurological:     Mental Status: She is alert.     GCS: GCS motor subscore is 6.      UC Treatments / Results  Labs (all labs ordered are listed, but only abnormal results are displayed) Labs Reviewed  RESP PANEL BY RT-PCR (FLU A&B, COVID) ARPGX2 - Abnormal; Notable for the following components:      Result Value   SARS Coronavirus 2 by RT PCR POSITIVE (*)    All other components within normal limits    EKG   Radiology No results found.  Procedures Procedures (including critical care time)  Medications Ordered in UC Medications - No data to display  Initial Impression / Assessment and Plan / UC Course  I have reviewed the triage vital signs and the nursing notes.  Pertinent labs & imaging results that were available during my care of the patient were reviewed by me and considered in my medical decision making (see chart for details).  Clinical impression:  5 to 6 days of upper respiratory symptoms including congestion facial pain pressure and cough.  Patient is positive for Covid.  Treatment plan: Clinical Course as of 07/17/20 [id number]  Sun Jul 17, 2020  [id number] Influenza B By PCR: NEGATIVE [KB]    Clinical Course User Index [KB] Verda Cumins, MD  1 the findings and treatment plan were discussed in detail with the patient.  Patient was in agreement. 2.  Patient is positive for Covid.  We will give her an educational handout regarding CDC guidelines and quarantine. 3.  Gave her a work note she should quarantine for 10 days and if asymptomatic for the 72 hours preceding her 10 days she can return to work on day 11. 4.  Supportive care, over-the-counter meds as needed, Tylenol Motrin for fever discomfort.  She does not appear unwell enough to justify monoclonal antibody treatment at the present time however if things change she  can contact us and we can look into that. 5.  Red flag signs and symptoms were discussed.  If she develops any respiratory distress she should go to the emergency room immediately for evaluation.  Otherwise we will see her back as needed.   Final Clinical Impressions(s) / UC Diagnoses   Final diagnoses:  COVID-19  Viral URI with cough     Discharge Instructions     Patient is positive for Covid.  We will give her an educational handout regarding CDC guidelines and quarantine. Gave her a work note she should quarantine for 10 days and if asymptomatic for the 72 hours preceding her 10 days she can  return to work on day 11. Supportive care, over-the-counter meds as needed, Tylenol Motrin for fever discomfort.  She does not appear unwell enough to justify monoclonal antibody treatment at the present time however if things change she can contact us and we can look into that. Red flag signs and symptoms were discussed.  If she develops any respiratory distress she should go to the emergency room immediately for evaluation.  Otherwise we will see her back as needed.    ED Prescriptions    None     PDMP not reviewed this encounter.   Verda Cumins, MD 07/17/20 1736

## 2020-07-17 NOTE — ED Triage Notes (Signed)
Patient c/o headaches, runny nose, nasal congestion, slight cough that started on Friday.  Patient denies fevers. Patient states that she has had some blood from her nose this morning.

## 2020-07-17 NOTE — Discharge Instructions (Addendum)
Patient is positive for Covid.  We will give her an educational handout regarding CDC guidelines and quarantine. Gave her a work note she should quarantine for 10 days and if asymptomatic for the 72 hours preceding her 10 days she can return to work on day 41. Supportive care, over-the-counter meds as needed, Tylenol Motrin for fever discomfort.  She does not appear unwell enough to justify monoclonal antibody treatment at the present time however if things change she can contact us and we can look into that. Red flag signs and symptoms were discussed.  If she develops any respiratory distress she should go to the emergency room immediately for evaluation.  Otherwise we will see her back as needed.

## 2020-07-18 ENCOUNTER — Other Ambulatory Visit: Payer: Self-pay | Admitting: Family Medicine

## 2020-07-18 ENCOUNTER — Ambulatory Visit: Payer: Self-pay

## 2020-07-18 ENCOUNTER — Ambulatory Visit: Payer: 59

## 2020-07-20 ENCOUNTER — Ambulatory Visit: Payer: 59

## 2020-08-16 ENCOUNTER — Encounter: Payer: Self-pay | Admitting: Family Medicine

## 2020-08-16 ENCOUNTER — Telehealth: Payer: Self-pay | Admitting: Emergency Medicine

## 2020-08-16 NOTE — Telephone Encounter (Signed)
Patient called and stated she was put on antibiotics and steroids after having Covid. She is now complaining of frequent urination. Would like to know if something can be called in

## 2020-08-16 NOTE — Telephone Encounter (Signed)
Patient notified she must be seen

## 2020-08-26 ENCOUNTER — Encounter: Payer: Self-pay | Admitting: Family Medicine

## 2020-08-29 NOTE — Progress Notes (Signed)
Name: Marie Vaughan   MRN: [id number]    DOB: Dec 06, 1970   Date:08/30/2020       Progress Note  Subjective  Chief Complaint  Acute visit for Left Arm pain   HPI  Hematuria: she states about 3 weeks ago she noticed pain at the end of micturition and blood when she wipes, she also has sensation of incomplete void. It does not happen every time but at lease once a day. Denies change in back pain, but no  chills or fever . No personal history of kidney stones  Paresthesia: she woke up about 10 days ago with left hand feeling aching and tingling, pain with abduction of left arm and left side of neck felt stiff. She states pain is constant. Worse with movement of left arm and also movement of neck. She sees Dr. Holley Raring for chronic back pain but missed past two visits. She denies weakness , just pain. Discussed symptoms of radiculitis, possibly from neck   HTN: needs rx sent to different pharmacy, in pain today, bp has been good at other doctor's visits, no sob or chest pain ( having shoulder spasms )  Morbid obesity: she is drinking more water and trying to stay active. She states weight had gone up to 230 lbs and is trending now   Patient Active Problem List   Diagnosis Date Noted  . Morbid obesity ([place]) 08/30/2020  . Sacroiliac joint pain (RIGHT) 01/20/2020  . Lumbar facet arthropathy 12/08/2019  . Chronic pain syndrome 12/08/2019  . History of ovarian cyst 03/05/2018  . Left ovarian cyst 03/05/2018  . History of hysterectomy 01/31/2018  . Colon cancer screening   . Polyp of sigmoid colon   . Benign neoplasm of transverse colon   . Iron deficiency anemia   . Metabolic syndrome 96/22/2979  . OSA on CPAP 02/03/2016  . 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 Abnormal brain MRI 12/20/2015  . Dizziness 12/13/2015  . Near syncope 12/10/2015  . Atypical chest pain 08/22/2013  . Essential hypertension 08/22/2013  . Angio-edema   . GERD (gastroesophageal reflux disease)   . Migraine with aura and  without status migrainosus   . Acne   . Allergic rhinitis 10/05/2011  . Insomnia 08/30/2011  . Obesity 08/30/2011    Past Surgical History:  Procedure Laterality Date  . ABDOMINAL HYSTERECTOMY  04-25-10   Due to abnormal PAP  . CARDIAC CATHETERIZATION  04/2014   [facility]  . COLONOSCOPY WITH PROPOFOL N/A 07/11/2017   Procedure: COLONOSCOPY WITH PROPOFOL;  Surgeon: [name], MD;  Location: [facility];  Service: Endoscopy;  Laterality: N/A;  . DILATION AND CURETTAGE OF UTERUS    . ESOPHAGOGASTRODUODENOSCOPY (EGD) WITH PROPOFOL N/A 07/11/2017   Procedure: ESOPHAGOGASTRODUODENOSCOPY (EGD) WITH PROPOFOL;  Surgeon: [name], MD;  Location: [facility];  Service: Endoscopy;  Laterality: N/A;  diabetic - diet controlled  . GIVENS CAPSULE STUDY N/A 09/23/2017   Procedure: GIVENS CAPSULE STUDY;  Surgeon: Virgel Manifold, MD;  Location: [facility];  Service: Endoscopy;  Laterality: N/A;  . KNEE ARTHROSCOPY Left 06/10/2013   Procedure: LEFT KNEE ARTHROSCOPY KNEE WITH PARTIAL MEDIAL MENISCECTOMY ;  Surgeon: Lorn Junes, MD;  Location: [facility];  Service: Orthopedics;  Laterality: Left;  xerofoam, 4x4's, webril, ace wrap, ice wrap  . KNEE ARTHROSCOPY WITH MEDIAL MENISECTOMY Right 06/10/2013   Procedure: RIGHT KNEE ARTHROSCOPY WITH MEDIAL MENISECTOMY;  Surgeon: Lorn Junes, MD;  Location: [facility];  Service: Orthopedics;  Laterality:  Right;  partial lateral menisectomoy and chondroplasty  . OVARIAN CYST REMOVAL  04-25-10    Family History  Problem Relation Age of Onset  . Early death Mother        Murdered when pt was 8 years old  . Diabetes Mother   . Hypertension Mother   . Gout Mother   . Kidney failure Mother   . Early death Sister 41       due to hemorage  . Alcohol abuse Maternal Uncle   . Alcohol abuse Other   . Arthritis Other   . Heart disease Other   . Other Other        cousin- phlebitis  . Diabetes Maternal  Aunt   . Hyperlipidemia Maternal Aunt   . Hypertension Maternal Aunt   . Prostate cancer Maternal Uncle   . Breast cancer Neg Hx     Social History   Tobacco Use  . Smoking status: Never Smoker  . Smokeless tobacco: Never Used  Substance Use Topics  . Alcohol use: No    Alcohol/week: 0.0 standard drinks     Current Outpatient Medications:  .  aspirin EC 81 MG tablet, Take 1 tablet (81 mg total) by mouth daily., Disp: 90 tablet, Rfl: 3 .  ciprofloxacin (CIPRO) 250 MG tablet, Take 1 tablet (250 mg total) by mouth 2 (two) times daily., Disp: 6 tablet, Rfl: 0 .  Cyanocobalamin (B-12) 1000 MCG SUBL, Place 1 tablet under the tongue daily., Disp: 90 each, Rfl: 1 .  diclofenac sodium (VOLTAREN) 1 % GEL, Apply 4 g topically 4 (four) times daily., Disp: 300 g, Rfl: 0 .  ferrous sulfate 324 (65 Fe) MG TBEC, Take 1 tablet (325 mg total) by mouth daily., Disp: 90 tablet, Rfl: 1 .  gabapentin (NEURONTIN) 300 MG capsule, Take 1 capsule (300 mg total) by mouth at bedtime., Disp: 30 capsule, Rfl: 1 .  predniSONE (DELTASONE) 10 MG tablet, Take 1-3 tablets (10-30 mg total) by mouth in the morning and at bedtime. Take 6 for 2 days, 5 for 2 days, 4 for 2 days, 3 for 2 days, 2 for 2 days and 1 for 2 days, Disp: 42 tablet, Rfl: 0 .  tiZANidine (ZANAFLEX) 4 MG tablet, Take 1 tablet (4 mg total) by mouth daily as needed for muscle spasms., Disp: 30 tablet, Rfl: 1 .  Ubrogepant (UBRELVY) 50 MG TABS, Take 1-2 tablets by mouth daily as needed., Disp: 10 tablet, Rfl: 2 .  omeprazole (PRILOSEC) 40 MG capsule, Take 1 capsule (40 mg total) by mouth daily., Disp: 90 capsule, Rfl: 0 .  potassium chloride (MICRO-K) 10 MEQ CR capsule, Take 1 capsule (10 mEq total) by mouth daily., Disp: 90 capsule, Rfl: 0 .  valsartan-hydrochlorothiazide (DIOVAN-HCT) 160-12.5 MG tablet, Take 1 tablet by mouth daily., Disp: 90 tablet, Rfl: 0  Allergies  Allergen Reactions  . Chocolate     Hives, itching  . Chocolate Flavor Other (See  Comments)    Hives, itching  . Peanut-Containing Drug Products Swelling    cashews  . Pineapple     I personally reviewed active problem list, medication list, allergies, family history, social history, health maintenance with the patient/caregiver today.   ROS  Constitutional: Negative for fever or weight change.  Respiratory: Negative for cough and shortness of breath.   Cardiovascular: Negative for chest pain or palpitations.  Gastrointestinal: Negative for abdominal pain, no bowel changes.  Musculoskeletal: Negative for gait problem or joint swelling.  Skin: Negative for rash.  Neurological: Negative for dizziness , occasional  headache.  No other specific complaints in a complete review of systems (except as listed in HPI above).  Objective   Vitals:   08/30/20 1657  BP: 138/86  Pulse: 89  Resp: 16  SpO2: 98%  Weight: 218 lb 5 oz (99 kg)    Body mass index is 39.93 kg/m.  Physical Exam  Constitutional: Patient appears well-developed and well-nourished. Obese No distress.  HEENT: head atraumatic, normocephalic, pupils equal and reactive to light, neck supple Cardiovascular: Normal rate, regular rhythm and normal heart sounds.  No murmur heard. No BLE edema. Pulmonary/Chest: Effort normal and breath sounds normal. No respiratory distress. Abdominal: Soft.  There is no tenderness. Muscular skeletal: grip weaker on left when compared to right, allodynia on radial aspect of left arm, positive impingement sign left, pain with extension of neck when neck rotated to the right side , tender to touch during palpation of left shoulder  Psychiatric: Patient has a normal mood and affect. behavior is normal. Judgment and thought content normal.   PHQ2/9: Depression screen [facility] 2/9 08/30/2020 12/23/2019 12/16/2019 12/08/2019 10/29/2019  Decreased Interest 0 0 0 0 0  Down, Depressed, Hopeless 0 0 0 0 0  PHQ - 2 Score 0 0 0 0 0  Altered sleeping - - 0 2 0  Tired, decreased energy - -  0 1 0  Change in appetite - - 0 1 0  Feeling bad or failure about yourself  - - 0 0 0  Trouble concentrating - - 0 0 0  Moving slowly or fidgety/restless - - 0 0 0  Suicidal thoughts - - 0 0 0  PHQ-9 Score - - 0 4 0  Difficult doing work/chores - - Not difficult at all - -    phq 9 is negative   Fall Risk: Fall Risk  08/30/2020 03/02/2020 01/20/2020 12/23/2019 12/16/2019  Falls in the past year? 1 0 0 0 0  Comment - - - - -  Number falls in past yr: 1 - - - 0  Injury with Fall? 0 - - - 0  Risk Factor Category  - - - - -  Risk for fall due to : - - - - -  Risk for fall due to: Comment - - - - -  Follow up - - - - Falls evaluation completed     Functional Status Survey: Is the patient deaf or have difficulty hearing?: No Does the patient have difficulty seeing, even when wearing glasses/contacts?: No Does the patient have difficulty concentrating, remembering, or making decisions?: No Does the patient have difficulty walking or climbing stairs?: No Does the patient have difficulty dressing or bathing?: No Does the patient have difficulty doing errands alone such as visiting a doctor's office or shopping?: No    Assessment & Plan  1. Dysuria  - POCT Urinalysis Dipstick - CULTURE, URINE COMPREHENSIVE - ciprofloxacin (CIPRO) 250 MG tablet; Take 1 tablet (250 mg total) by mouth 2 (two) times daily.  Dispense: 6 tablet; Refill: 0 - Urine Culture  2. Essential hypertension  - valsartan-hydrochlorothiazide (DIOVAN-HCT) 160-12.5 MG tablet; Take 1 tablet by mouth daily.  Dispense: 90 tablet; Refill: 0 - BASIC METABOLIC PANEL WITH GFR  3. Gastroesophageal reflux disease without esophagitis  - omeprazole (PRILOSEC) 40 MG capsule; Take 1 capsule (40 mg total) by mouth daily.  Dispense: 90 capsule; Refill: 0  4. Hypokalemia  - potassium chloride (MICRO-K) 10 MEQ CR capsule; Take 1 capsule (10  mEq total) by mouth daily.  Dispense: 90 capsule; Refill: 0  5. Need for hepatitis C  screening test  - Hepatitis C antibody  6. Morbid obesity ([place])  Discussed with the patient the risk posed by an increased BMI. Discussed importance of portion control, calorie counting and at least 150 minutes of physical activity weekly. Avoid sweet beverages and drink more water. Eat at least 6 servings of fruit and vegetables daily   7. Radiculitis  Explained that since she is in a lot of pain not sure if symptoms arriving from neck or shoulder, she has gabapentin and tizanidine at home, we will give her prednisone taper to take with food as directed. Explained she can discuss it with Dr. Holley Raring since he already sees her for DDD lumbar spine, or we may need to get EMG or order c-spine if symptoms localize more   - predniSONE (DELTASONE) 10 MG tablet; Take 1-3 tablets (10-30 mg total) by mouth in the morning and at bedtime. Take 6 for 2 days, 5 for 2 days, 4 for 2 days, 3 for 2 days, 2 for 2 days and 1 for 2 days  Dispense: 42 tablet; Refill: 0 Discussed with the patient the risk posed by an increased BMI. Discussed importance of portion control, calorie counting and at least 150 minutes of physical activity weekly. Avoid sweet beverages and drink more water. Eat at least 6 servings of fruit and vegetables daily

## 2020-08-30 ENCOUNTER — Telehealth: Payer: Self-pay | Admitting: Family Medicine

## 2020-08-30 ENCOUNTER — Other Ambulatory Visit: Payer: Self-pay

## 2020-08-30 ENCOUNTER — Encounter: Payer: Self-pay | Admitting: Family Medicine

## 2020-08-30 ENCOUNTER — Ambulatory Visit: Payer: Managed Care, Other (non HMO) | Admitting: Family Medicine

## 2020-08-30 VITALS — BP 138/86 | HR 89 | Resp 16 | Ht 62.0 in | Wt 218.3 lb

## 2020-08-30 DIAGNOSIS — K219 Gastro-esophageal reflux disease without esophagitis: Secondary | ICD-10-CM | POA: Diagnosis not present

## 2020-08-30 DIAGNOSIS — R300 Dysuria: Secondary | ICD-10-CM

## 2020-08-30 DIAGNOSIS — I10 Essential (primary) hypertension: Secondary | ICD-10-CM | POA: Diagnosis not present

## 2020-08-30 DIAGNOSIS — M5410 Radiculopathy, site unspecified: Secondary | ICD-10-CM

## 2020-08-30 DIAGNOSIS — Z1159 Encounter for screening for other viral diseases: Secondary | ICD-10-CM

## 2020-08-30 DIAGNOSIS — Z1231 Encounter for screening mammogram for malignant neoplasm of breast: Secondary | ICD-10-CM

## 2020-08-30 DIAGNOSIS — E876 Hypokalemia: Secondary | ICD-10-CM | POA: Diagnosis not present

## 2020-08-30 LAB — POCT URINALYSIS DIPSTICK
Blood, UA: NEGATIVE
Glucose, UA: NEGATIVE
Ketones, UA: NEGATIVE
Nitrite, UA: NEGATIVE
Protein, UA: NEGATIVE
Spec Grav, UA: 1.025 (ref 1.010–1.025)
Urobilinogen, UA: 0.2 E.U./dL
pH, UA: 6 (ref 5.0–8.0)

## 2020-08-30 MED ORDER — CIPROFLOXACIN HCL 250 MG PO TABS: 250.0000 mg | ORAL_TABLET | Freq: Two times a day (BID) | ORAL | 0 refills | Status: DC

## 2020-08-30 MED ORDER — VALSARTAN-HYDROCHLOROTHIAZIDE 160-12.5 MG PO TABS: 1.0000 | ORAL_TABLET | Freq: Every day | ORAL | 0 refills | Status: DC

## 2020-08-30 MED ORDER — PREDNISONE 10 MG PO TABS: 10.0000 mg | ORAL_TABLET | Freq: Two times a day (BID) | ORAL | 0 refills | Status: DC

## 2020-08-30 MED ORDER — POTASSIUM CHLORIDE ER 10 MEQ PO CPCR: 10.0000 meq | ORAL_CAPSULE | Freq: Every day | ORAL | 0 refills | Status: DC

## 2020-08-30 MED ORDER — OMEPRAZOLE 40 MG PO CPDR: 40.0000 mg | DELAYED_RELEASE_CAPSULE | Freq: Every day | ORAL | 0 refills | Status: DC

## 2020-08-30 NOTE — Telephone Encounter (Signed)
Joe, from pharmacy, calling regarding the pts prescription for Prednisone. He states that there are two different sets of instructions and is needing to clarify. Please advise.       [id number] [address], [place] AT [facility] [address] [place]  [place] [address]  Phone: [phone] Fax: [phone]  Hours: Not open 24 hours

## 2020-08-31 ENCOUNTER — Telehealth: Payer: Self-pay

## 2020-08-31 DIAGNOSIS — M5410 Radiculopathy, site unspecified: Secondary | ICD-10-CM

## 2020-08-31 LAB — BASIC METABOLIC PANEL WITH GFR
BUN: 13 mg/dL (ref 7–25)
CO2: 27 mmol/L (ref 20–32)
Calcium: 10 mg/dL (ref 8.6–10.4)
Chloride: 104 mmol/L (ref 98–110)
Creat: 0.7 mg/dL (ref 0.50–1.05)
GFR, Est African American: 116 mL/min/{1.73_m2} (ref >=60)
GFR, Est Non African American: 100 mL/min/{1.73_m2} (ref >=60)
Glucose, Bld: 80 mg/dL (ref 65–99)
Potassium: 3.6 mmol/L (ref 3.5–5.3)
Sodium: 141 mmol/L (ref 135–146)

## 2020-08-31 LAB — HEPATITIS C ANTIBODY
Hepatitis C Ab: NONREACTIVE
SIGNAL TO CUT-OFF: 0.01 (ref <1.00)

## 2020-08-31 MED ORDER — PREDNISONE 10 MG PO TABS: 10.0000 mg | ORAL_TABLET | Freq: Two times a day (BID) | ORAL | 0 refills | Status: DC

## 2020-08-31 NOTE — Addendum Note (Signed)
Addended by: Steele Sizer F on: 08/31/2020 05:12 PM   Modules accepted: Orders

## 2020-08-31 NOTE — Telephone Encounter (Signed)
Copied from [place] [phone]. Topic: General - Other >> Aug 30, 2020  4:55 PM Yvette Rack wrote: Reason for CRM: [place] called for clarification on the Rx for predniSONE (DELTASONE) 10 MG tablet. Called office but there was no answer. [place] requests call back

## 2020-09-02 LAB — CULTURE, URINE COMPREHENSIVE
MICRO NUMBER:: 11512447
SPECIMEN QUALITY:: ADEQUATE

## 2020-09-06 ENCOUNTER — Encounter: Payer: Self-pay | Admitting: Family Medicine

## 2020-09-12 ENCOUNTER — Other Ambulatory Visit: Payer: Self-pay | Admitting: Family Medicine

## 2020-09-12 DIAGNOSIS — E538 Deficiency of other specified B group vitamins: Secondary | ICD-10-CM

## 2020-09-12 DIAGNOSIS — Z862 Personal history of diseases of the blood and blood-forming organs and certain disorders involving the immune mechanism: Secondary | ICD-10-CM

## 2020-09-15 ENCOUNTER — Other Ambulatory Visit: Payer: Self-pay

## 2020-09-15 ENCOUNTER — Encounter: Payer: Self-pay | Admitting: Student in an Organized Health Care Education/Training Program

## 2020-09-15 ENCOUNTER — Ambulatory Visit
Payer: Managed Care, Other (non HMO) | Attending: Student in an Organized Health Care Education/Training Program | Admitting: Student in an Organized Health Care Education/Training Program

## 2020-09-15 VITALS — BP 131/98 | HR 92 | Temp 97.2°F | Ht 62.0 in | Wt 217.0 lb

## 2020-09-15 DIAGNOSIS — M5412 Radiculopathy, cervical region: Secondary | ICD-10-CM | POA: Diagnosis not present

## 2020-09-15 DIAGNOSIS — M79602 Pain in left arm: Secondary | ICD-10-CM | POA: Diagnosis not present

## 2020-09-15 DIAGNOSIS — G894 Chronic pain syndrome: Secondary | ICD-10-CM | POA: Diagnosis present

## 2020-09-15 DIAGNOSIS — M792 Neuralgia and neuritis, unspecified: Secondary | ICD-10-CM | POA: Diagnosis not present

## 2020-09-15 MED ORDER — TIZANIDINE HCL 4 MG PO TABS: 4.0000 mg | ORAL_TABLET | Freq: Every day | ORAL | 1 refills | Status: AC | PRN

## 2020-09-15 MED ORDER — GABAPENTIN 300 MG PO CAPS: 300.0000 mg | ORAL_CAPSULE | Freq: Every day | ORAL | 1 refills | Status: DC

## 2020-09-15 NOTE — Progress Notes (Signed)
Safety precautions to be maintained throughout the outpatient stay will include: orient to surroundings, keep bed in low position, maintain call bell within reach at all times, provide assistance with transfer out of bed and ambulation.  

## 2020-09-15 NOTE — Patient Instructions (Signed)
Call our office after MRI

## 2020-09-15 NOTE — Progress Notes (Signed)
PROVIDER NOTE: Information contained herein reflects review and annotations entered in association with encounter. Interpretation of such information and data should be left to medically-trained personnel. Information provided to patient can be located elsewhere in the medical record under "Patient Instructions". Document created using STT-dictation technology, any transcriptional errors that may result from process are unintentional.    Patient: Marie Vaughan  Service Category: E/M  Provider: Bilal Lateef, MD  DOB: 01/20/1971  DOS: 09/15/2020  Specialty: Interventional Pain Management  MRN: [id number]  Setting: Ambulatory outpatient  PCP: Sowles, Krichna, MD  Type: Established Patient    Referring Provider: Sowles, Krichna, MD  Location: Office  Delivery: Face-to-face     HPI  Ms. Marie Vaughan, a 50 y.o. year old female, is here today because of her Cervical radicular pain [M54.12]. Ms. Pendry's primary complain today is Neck Pain  Pertinent problems: Ms. Deroy has Obesity; Migraine with aura and without status migrainosus; OSA on CPAP; Metabolic syndrome; Lumbar facet arthropathy; Chronic pain syndrome; and Sacroiliac joint pain (RIGHT) on their pertinent problem list. Pain Assessment: Severity of Chronic pain is reported as a 10-Worst pain ever/10. Location: Neck Lower/pain radiaties down her neck to her left side down her arm, her finger become numb and tingling. Onset: More than a month ago. Quality: Aching,Numbness,Tingling,Throbbing,Burning. Timing: Constant. Modifying factor(s): meds, rest. Vitals:  height is 5' 2" (1.575 m) and weight is 217 lb (98.4 kg). Her temperature is 97.2 F (36.2 C) (abnormal). Her blood pressure is 131/98 (abnormal) and her pulse is 92. Her oxygen saturation is 100%.   Reason for encounter: follow-up evaluation    Last visit with me was in August 2021 for R SI-Joint pain. Did get relief from SI-J block.  She states that she also got a standing desk at her job which  helped her out with her low back and SI joint pain.  She has since changed jobs and is now working at [employer] where she is sitting down more often she states.  She states that she is having increased neck pain that radiates down her left arm in a dermatomal fashion.  She describes numbness and tingling of her hands.  She describes swelling and cramping of her hands.  She states that she is weak.  She has tried a steroid taper prescribed by her primary care provider with no benefit.  Patient also endorses headaches that start towards the inferior portion of her occiput and radiate superiorly towards her scalp.  She states that pressure application to this region by her thumb helps to alleviate some of those headache symptoms.   ROS  Constitutional: Denies any fever or chills Gastrointestinal: No reported hemesis, hematochezia, vomiting, or acute GI distress Musculoskeletal: Left arm pain Neurological: No reported episodes of acute onset apraxia, aphasia, dysarthria, agnosia, amnesia, paralysis, loss of coordination, or loss of consciousness  Medication Review  B-12, Ubrogepant, aspirin EC, ciprofloxacin, diclofenac sodium, ferrous sulfate, gabapentin, omeprazole, potassium chloride, predniSONE, tiZANidine, and valsartan-hydrochlorothiazide  History Review  Allergy: Ms. Lipuma is allergic to chocolate, chocolate flavor, peanut-containing drug products, and pineapple. Drug: Ms. Pant  reports no history of drug use. Alcohol:  reports no history of alcohol use. Tobacco:  reports that she has never smoked. She has never used smokeless tobacco. Social: Ms. Blaisdell  reports that she has never smoked. She has never used smokeless tobacco. She reports that she does not drink alcohol and does not use drugs. Medical:  has a past medical history of Acne, Acute bronchitis,   Allergy, Angio-edema, Anxiety, Anxiety state, unspecified, Cervicalgia, Contact lens/glasses fitting, Dysmenorrhea, GERD  (gastroesophageal reflux disease), History of cardiac cath, History of stress test, HTN (hypertension), Iron deficiency anemia secondary to blood loss (chronic), Migraine with aura, without mention of intractable migraine without mention of status migrainosus, Obesity, Other disorders of bone and cartilage(733.99), Pain in thoracic spine, Pharyngitis, Tear of medial meniscus of left knee, Tear of medial meniscus of right knee, Vaginal delivery, and Wears contact lenses. Surgical: Ms. Granquist  has a past surgical history that includes Ovarian cyst removal (04-25-10); Abdominal hysterectomy (04-25-10); Knee arthroscopy with medial menisectomy (Right, 06/10/2013); Knee arthroscopy (Left, 06/10/2013); Dilation and curettage of uterus; Cardiac catheterization (04/2014); Colonoscopy with propofol (N/A, 07/11/2017); Esophagogastroduodenoscopy (egd) with propofol (N/A, 07/11/2017); and Givens capsule study (N/A, 09/23/2017). Family: family history includes Alcohol abuse in her maternal uncle and another family member; Arthritis in an other family member; Diabetes in her maternal aunt and mother; Early death in her mother; Early death (age of onset: 22) in her sister; Gout in her mother; Heart disease in an other family member; Hyperlipidemia in her maternal aunt; Hypertension in her maternal aunt and mother; Kidney failure in her mother; Other in an other family member; Prostate cancer in her maternal uncle.  Laboratory Chemistry Profile   Renal Lab Results  Component Value Date   BUN 13 08/30/2020   CREATININE 0.70 08/30/2020   BCR NOT APPLICABLE 08/30/2020   GFRAA 116 08/30/2020   GFRNONAA 100 08/30/2020     Hepatic Lab Results  Component Value Date   AST 20 10/29/2019   ALT 22 10/29/2019   ALBUMIN 4.1 10/29/2019   ALKPHOS 82 10/29/2019   LIPASE 35 04/22/2015     Electrolytes Lab Results  Component Value Date   NA 141 08/30/2020   K 3.6 08/30/2020   CL 104 08/30/2020   CALCIUM 10.0 08/30/2020    MG 2.1 06/28/2017     Bone No results found for: VD25OH, VD125OH2TOT, VD3125OH2, VD2125OH2, 25OHVITD1, 25OHVITD2, 25OHVITD3, TESTOFREE, TESTOSTERONE   Inflammation (CRP: Acute Phase) (ESR: Chronic Phase) Lab Results  Component Value Date   CRP 0.8 12/12/2015   ESRSEDRATE 41 (H) 12/12/2015   LATICACIDVEN 0.9 12/10/2015       Note: Above Lab results reviewed.   Physical Exam  General appearance: Well nourished, well developed, and well hydrated. In no apparent acute distress Mental status: Alert, oriented x 3 (person, place, & time)       Respiratory: No evidence of acute respiratory distress Eyes: PERLA Vitals: BP (!) 131/98   Pulse 92   Temp (!) 97.2 F (36.2 C)   Ht 5' 2" (1.575 m)   Wt 217 lb (98.4 kg)   SpO2 100%   BMI 39.69 kg/m  BMI: Estimated body mass index is 39.69 kg/m as calculated from the following:   Height as of this encounter: 5' 2" (1.575 m).   Weight as of this encounter: 217 lb (98.4 kg). Ideal: Ideal body weight: 50.1 kg (110 lb 7.2 oz) Adjusted ideal body weight: 69.4 kg (153 lb 1.1 oz)  Cervical Spine Area Exam  Skin & Axial Inspection: No masses, redness, edema, swelling, or associated skin lesions Alignment: Symmetrical Functional ROM: Pain restricted ROM, to the left Stability: No instability detected Muscle Tone/Strength: Functionally intact. No obvious neuro-muscular anomalies detected. Sensory (Neurological): Dermatomal pain pattern left C4-C5  Upper Extremity (UE) Exam    Side: Right upper extremity  Side: Left upper extremity  Skin & Extremity Inspection: Skin color, temperature, and   hair growth are WNL. No peripheral edema or cyanosis. No masses, redness, swelling, asymmetry, or associated skin lesions. No contractures.  Skin & Extremity Inspection: Skin color, temperature, and hair growth are WNL. No peripheral edema or cyanosis. No masses, redness, swelling, asymmetry, or associated skin lesions. No contractures.  Functional ROM:  Unrestricted ROM          Functional ROM: Pain restricted ROM for all joints of upper extremity  Muscle Tone/Strength: Functionally intact. No obvious neuro-muscular anomalies detected.  Muscle Tone/Strength: Functionally intact. No obvious neuro-muscular anomalies detected.  Sensory (Neurological): Unimpaired          Sensory (Neurological): Dermatomal pain pattern          Palpation: No palpable anomalies              Palpation: No palpable anomalies              Provocative Test(s):  Phalen's test: deferred Tinel's test: deferred Apley's scratch test (touch opposite shoulder):  Action 1 (Across chest): deferred Action 2 (Overhead): deferred Action 3 (LB reach): deferred   Provocative Test(s):  Phalen's test: deferred Tinel's test: deferred Apley's scratch test (touch opposite shoulder):  Action 1 (Across chest): Decreased ROM Action 2 (Overhead): Decreased ROM Action 3 (LB reach): Decreased ROM   4 out of 5 strength left upper extremity: Shoulder abduction, elbow flexion, elbow extension, thumb extension. 5 out of 5 strength right upper extremity: Shoulder abduction, elbow flexion, elbow extension, thumb extension.   Assessment   Status Diagnosis  Having a Flare-up Having a Flare-up Having a Flare-up 1. Cervical radicular pain (left)   2. Left arm pain   3. Neuropathic pain (LUE)   4. Chronic pain syndrome      Updated Problems: Problem  Neuropathic pain (LUE)  Left Arm Pain  Cervical radicular pain (left)    Plan of Care  Ms. ANARA COWMAN has a current medication list which includes the following long-term medication(s): ferrous sulfate, gabapentin, omeprazole, potassium chloride, and valsartan-hydrochlorothiazide.   1. Cervical radicular pain (left) Left upper extremity weakness on exam, noted paresthesias of left upper extremity.  Noted swelling of left hand.  Positive Spurling's on the left.  Recommend cervical MRI.  Pending results we will discuss treatment  plan. - Ambulatory referral to Physical Therapy - MR CERVICAL SPINE WO CONTRAST; Future  2. Left arm pain - MR CERVICAL SPINE WO CONTRAST; Future  3. Neuropathic pain (LUE) - Ambulatory referral to Physical Therapy - MR CERVICAL SPINE WO CONTRAST; Future  4. Chronic pain syndrome - tiZANidine (ZANAFLEX) 4 MG tablet; Take 1 tablet (4 mg total) by mouth daily as needed for muscle spasms.  Dispense: 30 tablet; Refill: 1 - gabapentin (NEURONTIN) 300 MG capsule; Take 1-2 capsules (300-600 mg total) by mouth at bedtime.  Dispense: 30 capsule; Refill: 1 - Ambulatory referral to Physical Therapy - MR CERVICAL SPINE WO CONTRAST; Future   Pharmacotherapy (Medications Ordered): Meds ordered this encounter  Medications  . tiZANidine (ZANAFLEX) 4 MG tablet    Sig: Take 1 tablet (4 mg total) by mouth daily as needed for muscle spasms.    Dispense:  30 tablet    Refill:  1  . gabapentin (NEURONTIN) 300 MG capsule    Sig: Take 1-2 capsules (300-600 mg total) by mouth at bedtime.    Dispense:  30 capsule    Refill:  1   Orders:  Orders Placed This Encounter  Procedures  . MR CERVICAL SPINE WO CONTRAST  Patient presents with axial pain with possible radicular component.  In addition to any acute findings, please report on:  1. Facet (Zygapophyseal) joint DJD (Hypertrophy, space narrowing, subchondral sclerosis, and/or osteophyte formation) 2. DDD and/or IVDD (Loss of disc height, desiccation or "Black disc disease") 3. Pars defects 4. Spondylolisthesis, spondylosis, and/or spondyloarthropathies (include Degree/Grade of displacement in mm) 5. Vertebral body Fractures, including age (old, new/acute) 6. Modic Type Changes 7. Demineralization 8. Bone pathology 9. Central, Lateral Recess, and/or Foraminal Stenosis (include AP diameter of stenosis in mm) 10. Surgical changes (hardware type, status, and presence of fibrosis) NOTE: Please specify level(s) and laterality.    Standing Status:    Future    Standing Expiration Date:   12/13/2020    Order Specific Question:   What is the patient's sedation requirement?    Answer:   No Sedation    Order Specific Question:   Does the patient have a pacemaker or implanted devices?    Answer:   No    Order Specific Question:   Preferred imaging location?    Answer:   [facility] (table limit-350lbs)    Order Specific Question:   Call Results- Best Contact Number?    Answer:   [phone] ([facility])    Order Specific Question:   Radiology Contrast Protocol - do NOT remove file path    Answer:   [email or web address]  . Ambulatory referral to Physical Therapy    Referral Priority:   Routine    Referral Type:   Physical Medicine    Referral Reason:   Specialty Services Required    Requested Specialty:   Physical Therapy    Number of Visits Requested:   1   Follow-up plan:   Return for will call you with MRI results.     Status post L3, L4, L5 lumbar facet medial branch nerve block on 12/23/2019, right SI-J 03/02/20       Recent Visits No visits were found meeting these conditions. Showing recent visits within past 90 days and meeting all other requirements Today's Visits Date Type Provider Dept  09/15/20 Office Visit Lateef, Bilal, MD [facility]  Showing today's visits and meeting all other requirements Future Appointments No visits were found meeting these conditions. Showing future appointments within next 90 days and meeting all other requirements  I discussed the assessment and treatment plan with the patient. The patient was provided an opportunity to ask questions and all were answered. The patient agreed with the plan and demonstrated an understanding of the instructions.  Patient advised to call back or seek an in-person evaluation if the symptoms or condition worsens.  Duration of encounter: 30 minutes.  Note by: Bilal Lateef, MD Date: 09/15/2020; Time: 8:59 AM 

## 2020-12-06 ENCOUNTER — Other Ambulatory Visit: Payer: Self-pay

## 2020-12-20 ENCOUNTER — Encounter: Payer: Self-pay | Admitting: Family Medicine

## 2020-12-20 DIAGNOSIS — I10 Essential (primary) hypertension: Secondary | ICD-10-CM

## 2020-12-20 DIAGNOSIS — E876 Hypokalemia: Secondary | ICD-10-CM

## 2020-12-20 DIAGNOSIS — E538 Deficiency of other specified B group vitamins: Secondary | ICD-10-CM

## 2020-12-20 DIAGNOSIS — E785 Hyperlipidemia, unspecified: Secondary | ICD-10-CM

## 2020-12-21 ENCOUNTER — Other Ambulatory Visit: Payer: Self-pay | Admitting: Family Medicine

## 2020-12-21 DIAGNOSIS — E876 Hypokalemia: Secondary | ICD-10-CM

## 2020-12-21 DIAGNOSIS — E785 Hyperlipidemia, unspecified: Secondary | ICD-10-CM

## 2020-12-21 DIAGNOSIS — E538 Deficiency of other specified B group vitamins: Secondary | ICD-10-CM

## 2020-12-21 DIAGNOSIS — Z862 Personal history of diseases of the blood and blood-forming organs and certain disorders involving the immune mechanism: Secondary | ICD-10-CM

## 2020-12-21 DIAGNOSIS — E8881 Metabolic syndrome: Secondary | ICD-10-CM

## 2020-12-21 DIAGNOSIS — I10 Essential (primary) hypertension: Secondary | ICD-10-CM

## 2020-12-23 ENCOUNTER — Ambulatory Visit: Payer: Managed Care, Other (non HMO) | Admitting: Nurse Practitioner

## 2020-12-23 ENCOUNTER — Encounter: Payer: Self-pay | Admitting: Nurse Practitioner

## 2020-12-23 ENCOUNTER — Other Ambulatory Visit: Payer: Self-pay

## 2020-12-23 VITALS — BP 130/100 | HR 81 | Ht 62.0 in | Wt 224.1 lb

## 2020-12-23 DIAGNOSIS — G4733 Obstructive sleep apnea (adult) (pediatric): Secondary | ICD-10-CM

## 2020-12-23 DIAGNOSIS — R072 Precordial pain: Secondary | ICD-10-CM

## 2020-12-23 DIAGNOSIS — I10 Essential (primary) hypertension: Secondary | ICD-10-CM

## 2020-12-23 DIAGNOSIS — R0789 Other chest pain: Secondary | ICD-10-CM

## 2020-12-23 DIAGNOSIS — R0600 Dyspnea, unspecified: Secondary | ICD-10-CM | POA: Diagnosis not present

## 2020-12-23 DIAGNOSIS — R0609 Other forms of dyspnea: Secondary | ICD-10-CM

## 2020-12-23 MED ORDER — VALSARTAN-HYDROCHLOROTHIAZIDE 160-25 MG PO TABS: 1.0000 | ORAL_TABLET | Freq: Every day | ORAL | 3 refills | Status: DC

## 2020-12-23 NOTE — Patient Instructions (Addendum)
Medication Instructions:  Your physician has recommended you make the following change in your medication:   1. CHANGED Valsartan-Hydrochlorothiazide 160-25 mg once daily    *If you need a refill on your cardiac medications before your next appointment, please call your pharmacy*   Lab Work: CBC, BMET, TSH today  If you have labs (blood work) drawn today and your tests are completely normal, you will receive your results only by: Marland Kitchen MyChart Message (if you have MyChart) OR . A paper copy in the mail If you have any lab test that is abnormal or we need to change your treatment, we will call you to review the results.   Testing/Procedures:  Your physician has requested that you have an echocardiogram. Echocardiography is a painless test that uses sound waves to create images of your heart. It provides your doctor with information about the size and shape of your heart and how well your heart's chambers and valves are working. This procedure takes approximately one hour. There are no restrictions for this procedure.   Your cardiac CT will be scheduled at the below location:   [facility] [address] [place], [place] [address] [phone]   If scheduled at [facility], please arrive at the [facility] main entrance (entrance A) of [facility] 30 minutes prior to test start time. Proceed to the [facility] Radiology Department (first floor) to check-in and test prep.   Please follow these instructions carefully (unless otherwise directed):  Hold all erectile dysfunction medications at least 3 days (72 hrs) prior to test.  On the Night Before the Test: . Be sure to Drink plenty of water. . Do not consume any caffeinated/decaffeinated beverages or chocolate 12 hours prior to your test. . Do not take any antihistamines 12 hours prior to your test.  On the Day of the Test: . Drink plenty of water until 1 hour prior to the test. . Do not eat any  food 4 hours prior to the test. . You may take your regular medications prior to the test.  . Take metoprolol 100 mg (Lopressor) two hours prior to test. . HOLD Furosemide/Hydrochlorothiazide morning of the test. . FEMALES- please wear underwire-free bra if available       After the Test: . Drink plenty of water. . After receiving IV contrast, you may experience a mild flushed feeling. This is normal. . On occasion, you may experience a mild rash up to 24 hours after the test. This is not dangerous. If this occurs, you can take Benadryl 25 mg and increase your fluid intake. . If you experience trouble breathing, this can be serious. If it is severe call 911 IMMEDIATELY. If it is mild, please call our office. . If you take any of these medications: Glipizide/Metformin, Avandament, Glucavance, please do not take 48 hours after completing test unless otherwise instructed.   Once we have confirmed authorization from your insurance company, we will call you to set up a date and time for your test. Based on how quickly your insurance processes prior authorizations requests, please allow up to 4 weeks to be contacted for scheduling your Cardiac CT appointment. Be advised that routine Cardiac CT appointments could be scheduled as many as 8 weeks after your provider has ordered it.  For non-scheduling related questions, please contact the cardiac imaging nurse navigator should you have any questions/concerns: Marchia Bond, Cardiac Imaging Nurse Navigator Gordy Clement, Cardiac Imaging Nurse Navigator [facility]  Herbalist Dial: [phone]   For scheduling needs, including cancellations and rescheduling, please call [place], [phone].     Follow-Up: At [facility], you and your health needs are our priority.  As part of our continuing mission to provide you with exceptional heart care, we have created designated Provider Care Teams.  These Care Teams  include your primary Cardiologist (physician) and Advanced Practice Providers (APPs -  Physician Assistants and Nurse Practitioners) who all work together to provide you with the care you need, when you need it.   Your next appointment:   6-8  week(s)  The format for your next appointment:   In Person  Provider:   Kathlyn Sacramento, MD

## 2020-12-23 NOTE — Progress Notes (Signed)
Office Visit    Patient Name: Marie Vaughan Date of Encounter: 12/23/2020  Primary Care Provider:  Steele Sizer, MD Primary Cardiologist:  Kathlyn Sacramento, MD  Chief Complaint    50 year old female with a history of chest pain and normal coronary arteries (October 2015), hypertension, GERD, OSA (not using CPAP), and obesity, who presents for follow-up related to chest pain and dyspnea.  Past Medical History    Past Medical History:  Diagnosis Date  . Acne   . Acute bronchitis   . Allergy   . Angio-edema   . Anxiety   . Anxiety state, unspecified   . Cervicalgia   . Contact lens/glasses fitting    wears contacts or glasses  . Dysmenorrhea   . GERD (gastroesophageal reflux disease)   . History of cardiac cath    a. 04/29/2014: LM nl, LAD no dz, D1 no dz, D2 very small vessel, D3 very small vessel, LCx nl, OM1 very small vessel, OM2 medium sized vessel, OM 3 no dz, RCA no dz, PDA no dz   . History of echocardiogram    a. 11/2015 Echo: EF 55-60%, no rwma.  . History of stress test    a. Lexiscan 08/25/13: no sig ischemia, attenuation artifact noted, no sig WMA noted, EF 69%, no EKG changes concerning for ischemia  . HTN (hypertension)   . Iron deficiency anemia secondary to blood loss (chronic)   . Migraine with aura, without mention of intractable migraine without mention of status migrainosus    "vestibular" per pt  . Obesity   . 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 Pain in thoracic spine   . Pharyngitis   . Tear of medial meniscus of left knee   . Tear of medial meniscus of right knee   . Vaginal delivery    x 3  . Wears contact lenses    Past Surgical History:  Procedure Laterality Date  . ABDOMINAL HYSTERECTOMY  04-25-10   Due to abnormal PAP  . CARDIAC CATHETERIZATION  04/2014   [facility]  . COLONOSCOPY WITH PROPOFOL N/A 07/11/2017   Procedure: COLONOSCOPY WITH PROPOFOL;  Surgeon: [name], MD;  Location: [place];  Service: Endoscopy;   Laterality: N/A;  . DILATION AND CURETTAGE OF UTERUS    . ESOPHAGOGASTRODUODENOSCOPY (EGD) WITH PROPOFOL N/A 07/11/2017   Procedure: ESOPHAGOGASTRODUODENOSCOPY (EGD) WITH PROPOFOL;  Surgeon: [name], MD;  Location: [place];  Service: Endoscopy;  Laterality: N/A;  diabetic - diet controlled  . GIVENS CAPSULE STUDY N/A 09/23/2017   Procedure: GIVENS CAPSULE STUDY;  Surgeon: Virgel Manifold, MD;  Location: [facility];  Service: Endoscopy;  Laterality: N/A;  . KNEE ARTHROSCOPY Left 06/10/2013   Procedure: LEFT KNEE ARTHROSCOPY KNEE WITH PARTIAL MEDIAL MENISCECTOMY ;  Surgeon: Lorn Junes, MD;  Location: [place];  Service: Orthopedics;  Laterality: Left;  xerofoam, 4x4's, webril, ace wrap, ice wrap  . KNEE ARTHROSCOPY WITH MEDIAL MENISECTOMY Right 06/10/2013   Procedure: RIGHT KNEE ARTHROSCOPY WITH MEDIAL MENISECTOMY;  Surgeon: Lorn Junes, MD;  Location: [place];  Service: Orthopedics;  Laterality: Right;  partial lateral menisectomoy and chondroplasty  . OVARIAN CYST REMOVAL  04-25-10    Allergies  Allergies  Allergen Reactions  . Chocolate     Hives, itching  . Chocolate Flavor Other (See Comments)    Hives, itching  . Peanut-Containing Drug Products Swelling    cashews  . Pineapple     History of  Present Illness    50 year old female with the above past medical history including chest pain, hypertension, GERD, obesity, anemia, OSA (not using CPAP), chronic pain, and migraines.  She was previously evaluated in 2015 in the setting of chest pain.  She initially underwent stress testing which was low risk.  She continued to have chest pain underwent diagnostic catheterization October 2015 showing normal coronary arteries and normal LV function.  Subsequent echocardiogram 2017, again showed normal LV function without any significant valvular disease.  She was last seen in cardiology clinic in June 2017, at which time she was  doing well and no further cardiac evaluation was warranted.  Following her last visit, she continued to have intermittent chest pain and dyspnea on exertion.  Over the past 2 years, both seem to have worsened and have hit a point where it is unbearable.  She experiences dyspnea with minimal activity and will also note exertional chest discomfort.  She also has chest pain at rest that seems to be worse with deep breathing.  Chest pain symptoms can last anywhere from 20 minutes to several hours.  She feels that the frequency, character, and severity of her symptoms have changed and worsened over the past 2 years, compared to what she was experiencing prior to then.  She also often feels swollen in her hands and ankles.  She is not careful with her sodium intake and frequently eats restaurant foods.  For all these reasons, she decided to reestablish cardiology care.  She denies palpitations, PND, orthopnea, dizziness, syncope, or early satiety.  Home Medications    Prior to Admission medications   Medication Sig Start Date End Date Taking? Authorizing Provider  aspirin EC 81 MG tablet Take 1 tablet (81 mg total) by mouth daily. 04/21/14   Dunn, Areta Haber, PA-C  Cyanocobalamin (B-12) 1000 MCG SUBL Place 1 tablet under the tongue daily. 08/01/17   Steele Sizer, MD  diclofenac sodium (VOLTAREN) 1 % GEL Apply 4 g topically 4 (four) times daily. 04/15/19   Steele Sizer, MD  ferrous sulfate 324 (65 Fe) MG TBEC Take 1 tablet (325 mg total) by mouth daily. 12/16/19   Steele Sizer, MD  gabapentin (NEURONTIN) 300 MG capsule Take 1-2 capsules (300-600 mg total) by mouth at bedtime. 09/15/20   Gillis Santa, MD  omeprazole (PRILOSEC) 40 MG capsule Take 1 capsule (40 mg total) by mouth daily. 08/30/20   Steele Sizer, MD  potassium chloride (MICRO-K) 10 MEQ CR capsule Take 1 capsule (10 mEq total) by mouth daily. 08/30/20   Steele Sizer, MD  tiZANidine (ZANAFLEX) 4 MG tablet Take 1 tablet (4 mg total) by mouth  daily as needed for muscle spasms. 09/15/20 09/15/21  Gillis Santa, MD  Ubrogepant (UBRELVY) 50 MG TABS Take 1-2 tablets by mouth daily as needed. 09/18/19   Steele Sizer, MD  valsartan-hydrochlorothiazide (DIOVAN-HCT) 160-12.5 MG tablet Take 1 tablet by mouth daily. 08/30/20   Steele Sizer, MD    Review of Systems    Rest and exertional chest pain as outlined above.  Chronic, progressive dyspnea on exertion.  She has also been experiencing upper and lower extremity swelling.  She denies palpitations, PND, orthopnea, dizziness, syncope, or early satiety.  All other systems reviewed and are otherwise negative except as noted above.  Physical Exam    VS:  BP (!) 130/100 (BP Location: Left Arm, Patient Position: Sitting, Cuff Size: Large)   Pulse 81   Ht 5\' 2"  (1.575 m)   Wt 224 lb 2  oz (101.7 kg)   SpO2 98%   BMI 40.99 kg/m  , BMI Body mass index is 40.99 kg/m. GEN: Obese, in no acute distress. HEENT: normal. Neck: Supple, obese, difficult to gauge JVP.  Carotid bruits, or masses. Cardiac: RRR, no murmurs, rubs, or gallops. No clubbing, cyanosis, trace bilateral lower extremity edema.  Radials/PT 2+ and equal bilaterally.  Respiratory:  Respirations regular and unlabored, clear to auscultation bilaterally. GI: Obese, soft, nontender, nondistended, BS + x 4. MS: no deformity or atrophy. Skin: warm and dry, no rash. Neuro:  Strength and sensation are intact. Psych: Normal affect.  Accessory Clinical Findings    ECG personally reviewed by me today -regular sinus rhythm, 81 - no acute changes.  Lab Results  Component Value Date   WBC 8.8 10/29/2019   HGB 13.0 10/29/2019   HCT 39.7 10/29/2019   MCV 87.1 10/29/2019   PLT 304 10/29/2019   Lab Results  Component Value Date   CREATININE 0.70 08/30/2020   BUN 13 08/30/2020   NA 141 08/30/2020   K 3.6 08/30/2020   CL 104 08/30/2020   CO2 27 08/30/2020   Lab Results  Component Value Date   ALT 22 10/29/2019   AST 20  10/29/2019   ALKPHOS 82 10/29/2019   BILITOT 0.5 10/29/2019   Lab Results  Component Value Date   CHOL 162 10/29/2019   HDL 48 10/29/2019   LDLCALC 76 10/29/2019   TRIG 191 (H) 10/29/2019   CHOLHDL 3.4 10/29/2019    Lab Results  Component Value Date   HGBA1C 6.2 (H) 10/29/2019    Assessment & Plan    1.  Rest and exertional chest pain: Patient has a prior history of chest pain with normal stress test and cardiac catheterization in 2015.  She also has chronic dyspnea on exertion.  Exertional chest pain sounds fairly typical for angina however resting symptoms occur suddenly and are worse with deep breathing, lasting anywhere from 20 minutes to several hours.  She notes that character and severity of chest pain have changed since her prior evaluation.  ECG today is normal.  Given ongoing risk factors including obesity and hypertension, I will arrange for a coronary CT angiography.  We discussed stress testing however, given that stress testing was normal in the past and only led to diagnostic catheterization due to ongoing symptoms, I did not feel that nuclear testing would suffice at this time.  2.  Dyspnea on exertion: This is chronic but also progressive.  She has also noted hand and ankle swelling.  She has only trace edema on exam today.  Body habitus makes evaluation of volume challenging.  She is hypertensive at 130/100 and notes that she is typically higher than that at home.  I will increase her Diovan HCT to 160-25 mg daily and arrange for an echocardiogram.  I will follow-up a basic metabolic panel and CBC today.  Obesity is also likely playing a role as is high sodium intake as she eats out frequently.  3.  Obstructive sleep apnea/fatigue: Patient notes fatigue throughout the day and often feels like she can easily fall asleep.  She does have a prior history of sleep apnea but does not wear CPAP.  I encouraged her to either begin wearing her CPAP or get back in touch with her sleep  medicine provider as she may require repeat testing if its been sometime since she was diagnosed.    4.  Essential hypertension: Blood pressure elevated today and she  notes that has been elevated at home.  Increasing Diovan HCT to 160-25 mg daily.  Following up labs today.  5.  Morbid obesity: She would benefit from regular exercise and caloric restrictions.  6.  Disposition: Follow-up lab work today, echo, and coronary CTA.  Follow-up in clinic in about 4 to 6 weeks.  Murray Hodgkins, NP 12/23/2020, 1:44 PM

## 2020-12-24 LAB — BASIC METABOLIC PANEL
BUN/Creatinine Ratio: 12 (ref 9–23)
BUN: 10 mg/dL (ref 6–24)
CO2: 22 mmol/L (ref 20–29)
Calcium: 10 mg/dL (ref 8.7–10.2)
Chloride: 104 mmol/L (ref 96–106)
Creatinine, Ser: 0.81 mg/dL (ref 0.57–1.00)
Glucose: 74 mg/dL (ref 65–99)
Potassium: 3.7 mmol/L (ref 3.5–5.2)
Sodium: 144 mmol/L (ref 134–144)
eGFR: 88 mL/min/{1.73_m2} (ref >59)

## 2020-12-24 LAB — CBC
Hematocrit: 40 % (ref 34.0–46.6)
Hemoglobin: 13.4 g/dL (ref 11.1–15.9)
MCH: 27.7 pg (ref 26.6–33.0)
MCHC: 33.5 g/dL (ref 31.5–35.7)
MCV: 83 fL (ref 79–97)
Platelets: 293 10*3/uL (ref 150–450)
RBC: 4.84 x10E6/uL (ref 3.77–5.28)
RDW: 12.7 % (ref 11.7–15.4)
WBC: 5.7 10*3/uL (ref 3.4–10.8)

## 2020-12-24 LAB — TSH: TSH: 0.755 u[IU]/mL (ref 0.450–4.500)

## 2021-01-11 ENCOUNTER — Telehealth (HOSPITAL_COMMUNITY): Payer: Self-pay | Admitting: Emergency Medicine

## 2021-01-11 DIAGNOSIS — R072 Precordial pain: Secondary | ICD-10-CM

## 2021-01-11 MED ORDER — METOPROLOL TARTRATE 100 MG PO TABS: 100.0000 mg | ORAL_TABLET | Freq: Once | ORAL | 0 refills | Status: DC

## 2021-01-11 NOTE — Telephone Encounter (Signed)
Reaching out to patient to offer assistance regarding upcoming cardiac imaging study; pt verbalizes understanding of appt date/time, parking situation and where to check in, pre-test NPO status and medications ordered, and verified current allergies; name and call back number provided for further questions should they arise Marchia Bond RN Navigator Cardiac Imaging [facility] [phone] office [phone] cell  100mg  metoprolol tart 2 hr prior  Holding HCTZ

## 2021-01-13 ENCOUNTER — Other Ambulatory Visit: Payer: Self-pay

## 2021-01-13 ENCOUNTER — Telehealth: Payer: Self-pay | Admitting: *Deleted

## 2021-01-13 ENCOUNTER — Ambulatory Visit (HOSPITAL_COMMUNITY)
Admission: RE | Admit: 2021-01-13 | Discharge: 2021-01-13 | Disposition: A | Discharge status: Home / Self Care | Payer: Managed Care, Other (non HMO) | Source: Ambulatory Visit | Attending: Nurse Practitioner | Admitting: Nurse Practitioner

## 2021-01-13 DIAGNOSIS — I2510 Atherosclerotic heart disease of native coronary artery without angina pectoris: Secondary | ICD-10-CM

## 2021-01-13 DIAGNOSIS — R072 Precordial pain: Secondary | ICD-10-CM | POA: Insufficient documentation

## 2021-01-13 MED ORDER — METOPROLOL TARTRATE 5 MG/5ML IV SOLN: 10.0000 mg | INTRAVENOUS | Status: DC | PRN

## 2021-01-13 MED ORDER — NITROGLYCERIN 0.4 MG SL SUBL
0.8000 mg | SUBLINGUAL_TABLET | Freq: Once | SUBLINGUAL | Status: AC
  Administered 2021-01-13: 0.8 mg via SUBLINGUAL

## 2021-01-13 MED ORDER — METOPROLOL TARTRATE 5 MG/5ML IV SOLN
INTRAVENOUS | Status: AC
  Administered 2021-01-13: 10 mg via INTRAVENOUS
  Filled 2021-01-13: qty 20

## 2021-01-13 MED ORDER — NITROGLYCERIN 0.4 MG SL SUBL
SUBLINGUAL_TABLET | SUBLINGUAL | Status: AC
  Filled 2021-01-13: qty 2

## 2021-01-13 MED ORDER — ROSUVASTATIN CALCIUM 5 MG PO TABS: 5.0000 mg | ORAL_TABLET | Freq: Every day | ORAL | 3 refills | Status: DC

## 2021-01-13 MED ORDER — IOHEXOL 350 MG/ML SOLN
100.0000 mL | Freq: Once | INTRAVENOUS | Status: AC | PRN
  Administered 2021-01-13: 100 mL via INTRAVENOUS

## 2021-01-13 NOTE — Telephone Encounter (Signed)
Left voicemail message to call back for review of results.  

## 2021-01-13 NOTE — Telephone Encounter (Signed)
Patient returning call for results 

## 2021-01-13 NOTE — Telephone Encounter (Signed)
Reviewed results and recommendations with patient and she was agreeable to start rosuvastatin 5 mg once daily and to have repeat labs in roughly 6 weeks. Will send her information on how and where to obtain labs with approximate date as well. Echocardiogram and follow up appointment have been scheduled and she is aware of those. She verbalized understanding of our conversation, agreeable with plan, and had no further questions at this time.

## 2021-01-13 NOTE — Telephone Encounter (Signed)
-----   Message from Marie Gianotti, NP sent at 01/13/2021  2:59 PM EDT ----- Minimal plaque in the mid left anterior descending artery.  This measured only 1-24% and is not causing her symptoms.  Coronary calcium score is mildly elevated @ 21.8.  Overall, findings related to her heart arteries are reassuring, but for future cardioprotection, in the setting of mild disease, I would recommend starting rosuvastatin 5 mg daily w/ follow-up lipids and lft's in six weeks.  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  This can contribute to shortness of breath and swelling.  I recommend obtaining an echocardiogram to better evaluate right sided/pulmonary pressures.  Finally, there were no other significant non-cardiac findings on this study.  Follow-up with Dr. Fletcher Anon in July as planned.

## 2021-02-08 ENCOUNTER — Ambulatory Visit (INDEPENDENT_AMBULATORY_CARE_PROVIDER_SITE_OTHER): Payer: Managed Care, Other (non HMO)

## 2021-02-08 ENCOUNTER — Other Ambulatory Visit: Payer: Self-pay

## 2021-02-08 DIAGNOSIS — R072 Precordial pain: Secondary | ICD-10-CM

## 2021-02-08 DIAGNOSIS — R0600 Dyspnea, unspecified: Secondary | ICD-10-CM

## 2021-02-08 DIAGNOSIS — R0609 Other forms of dyspnea: Secondary | ICD-10-CM

## 2021-02-08 LAB — ECHOCARDIOGRAM COMPLETE
AR max vel: 3.18 cm2
AV Area VTI: 3.53 cm2
AV Area mean vel: 3.3 cm2
AV Mean grad: 3 mmHg
AV Peak grad: 5.6 mmHg
Ao pk vel: 1.18 m/s
Area-P 1/2: 7.99 cm2
Calc EF: 53.1 %
S' Lateral: 3 cm
Single Plane A2C EF: 50.2 %
Single Plane A4C EF: 54.4 %

## 2021-02-10 ENCOUNTER — Ambulatory Visit (INDEPENDENT_AMBULATORY_CARE_PROVIDER_SITE_OTHER): Payer: Managed Care, Other (non HMO) | Admitting: Cardiovascular Disease

## 2021-02-10 ENCOUNTER — Encounter: Payer: Self-pay | Admitting: Cardiovascular Disease

## 2021-02-10 ENCOUNTER — Other Ambulatory Visit: Payer: Self-pay

## 2021-02-10 VITALS — BP 100/70 | HR 82 | Ht 62.0 in | Wt 220.2 lb

## 2021-02-10 DIAGNOSIS — I10 Essential (primary) hypertension: Secondary | ICD-10-CM | POA: Diagnosis not present

## 2021-02-10 DIAGNOSIS — R0600 Dyspnea, unspecified: Secondary | ICD-10-CM | POA: Diagnosis not present

## 2021-02-10 DIAGNOSIS — R072 Precordial pain: Secondary | ICD-10-CM

## 2021-02-10 DIAGNOSIS — R0609 Other forms of dyspnea: Secondary | ICD-10-CM

## 2021-02-10 DIAGNOSIS — G4733 Obstructive sleep apnea (adult) (pediatric): Secondary | ICD-10-CM

## 2021-02-10 NOTE — Patient Instructions (Signed)
Medication Instructions:   Your physician recommends that you continue on your current medications as directed. Please refer to the Current Medication list given to you today.  *If you need a refill on your cardiac medications before your next appointment, please call your pharmacy*   Lab Work:  Your physician recommends that you have lab work drawn at the medical mall anytime between today and 02/20/21   - Please go to the [facility]. You will check in at the front desk to the right as you walk into the atrium. Valet Parking is offered if needed. - No appointment needed. You may go any day between 7 am and 6 pm.     Testing/Procedures:   You are scheduled for a Cardiac Catheterization on Monday, August 8 with Dr. Kathlyn Sacramento.  1. Please arrive at the [place] [facility] at 8:30 AM (This time is one hour before your procedure to ensure your preparation). Free valet parking service is available.   Special note: Every effort is made to have your procedure done on time. Please understand that emergencies sometimes delay scheduled procedures.  2. Diet: Do not eat solid foods after midnight.  The patient may have clear liquids until 5am upon the day of the procedure.  3. Labs:  SEE ABOVE INSTRUCTIONS UNDER LAB  4. Medication instructions in preparation for your procedure:   Contrast Allergy: No    On the morning of your procedure, take your Aspirin and any morning medicines NOT listed above.  You may use sips of water.  5. Plan for one night stay--bring personal belongings. 6. Bring a current list of your medications and current insurance cards. 7. You MUST have a responsible person to drive you home. 8. Someone MUST be with you the first 24 hours after you arrive home or your discharge will be delayed. 9. Please wear clothes that are easy to get on and off and wear slip-on shoes.  Thank you for allowing [place] to care for you!   -- [facility]  [facility]    Follow-Up: At [facility], you and your health needs are our priority.  As part of our continuing mission to provide you with exceptional heart care, we have created designated Provider Care Teams.  These Care Teams include your primary Cardiologist (physician) and Advanced Practice Providers (APPs -  Physician Assistants and Nurse Practitioners) who all work together to provide you with the care you need, when you need it.  We recommend signing up for the patient portal called "MyChart".  Sign up information is provided on this After Visit Summary.  MyChart is used to connect with patients for Virtual Visits (Telemedicine).  Patients are able to view lab/test results, encounter notes, upcoming appointments, etc.  Non-urgent messages can be sent to your provider as well.   To learn more about what you can do with MyChart, go to [email or web address].    Your next appointment:   1 month(s)  The format for your next appointment:   In Person  Provider:   You may see Kathlyn Sacramento, MD or one of the following Advanced Practice Providers on your designated Care Team:   Murray Hodgkins, NP Christell Faith, PA-C Marrianne Mood, PA-C Cadence Kathlen Mody, [place]   Other Instructions

## 2021-02-10 NOTE — Progress Notes (Signed)
Cardiology Office Note   Date:  02/10/2021   ID:  Marie Vaughan, DOB 04-13-1971, MRN [id number]  PCP:  Steele Sizer, MD  Cardiologist:   Kathlyn Sacramento, MD   Chief Complaint  Patient presents with   Other    6-8 Wk f/u CT/ECHO. Meds reviewed verbally with pt.      History of Present Illness: Marie Vaughan is a 50 y.o. female who presents for a follow-up visit regarding chest pain. She has history of normal coronary arteries on cardiac catheterization 2015, essential hypertension, GERD, obstructive sleep apnea not on CPAP and obesity. She was seen again recently in our office by Ignacia Bayley for atypical chest pain and exertional dyspnea.  Her EKG was normal.  She underwent cardiac CTA which showed mild nonobstructive coronary artery disease with calcium score of 22.  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  Echocardiogram was done which showed an EF of 55 to 60% with grade 1 diastolic dysfunction and no significant valvular abnormalities.  Pulmonary pressure could not be estimated. The patient continues to have significant exertional dyspnea and intermittent chest pain.  Some of her chest pain seems to be pleuritic as it gets worse with taking a breath and.  She is very frustrated with her symptoms in spite of reassuring work-up so far. She does not use CPAP on a regular basis.   Past Medical History:  Diagnosis Date   Acne    Acute bronchitis    Allergy    Angio-edema    Anxiety    Anxiety state, unspecified    Cervicalgia    Contact lens/glasses fitting    wears contacts or glasses   Dysmenorrhea    GERD (gastroesophageal reflux disease)    History of cardiac cath    a. 04/29/2014: LM nl, LAD no dz, D1 no dz, D2 very small vessel, D3 very small vessel, LCx nl, OM1 very small vessel, OM2 medium sized vessel, OM 3 no dz, RCA no dz, PDA no dz    History of echocardiogram    a. 11/2015 Echo: EF 55-60%, no rwma.   History of stress test    a. Lexiscan 08/25/13: no sig  ischemia, attenuation artifact noted, no sig WMA noted, EF 69%, no EKG changes concerning for ischemia   HTN (hypertension)    Iron deficiency anemia secondary to blood loss (chronic)    Migraine with aura, without mention of intractable migraine without mention of status migrainosus    "vestibular" per pt   Obesity    Other disorders of bone and cartilage(733.99)    Pain in thoracic spine    Pharyngitis    Tear of medial meniscus of left knee    Tear of medial meniscus of right knee    Vaginal delivery    x 3   Wears contact lenses     Past Surgical History:  Procedure Laterality Date   ABDOMINAL HYSTERECTOMY  04-25-10   Due to abnormal PAP   CARDIAC CATHETERIZATION  04/2014   [facility]   COLONOSCOPY WITH PROPOFOL N/A 07/11/2017   Procedure: COLONOSCOPY WITH PROPOFOL;  Surgeon: [name], MD;  Location: [place];  Service: Endoscopy;  Laterality: N/A;   DILATION AND CURETTAGE OF UTERUS     ESOPHAGOGASTRODUODENOSCOPY (EGD) WITH PROPOFOL N/A 07/11/2017   Procedure: ESOPHAGOGASTRODUODENOSCOPY (EGD) WITH PROPOFOL;  Surgeon: [name], MD;  Location: [place];  Service: Endoscopy;  Laterality: N/A;  diabetic - diet controlled   GIVENS CAPSULE  STUDY N/A 09/23/2017   Procedure: GIVENS CAPSULE STUDY;  Surgeon: Virgel Manifold, MD;  Location: [facility];  Service: Endoscopy;  Laterality: N/A;   KNEE ARTHROSCOPY Left 06/10/2013   Procedure: LEFT KNEE ARTHROSCOPY KNEE WITH PARTIAL MEDIAL MENISCECTOMY ;  Surgeon: Lorn Junes, MD;  Location: [place];  Service: Orthopedics;  Laterality: Left;  xerofoam, 4x4's, webril, ace wrap, ice wrap   KNEE ARTHROSCOPY WITH MEDIAL MENISECTOMY Right 06/10/2013   Procedure: RIGHT KNEE ARTHROSCOPY WITH MEDIAL MENISECTOMY;  Surgeon: Lorn Junes, MD;  Location: [place];  Service: Orthopedics;  Laterality: Right;  partial lateral menisectomoy and chondroplasty   OVARIAN CYST REMOVAL  04-25-10      Current Outpatient Medications  Medication Sig Dispense Refill   aspirin EC 81 MG tablet Take 1 tablet (81 mg total) by mouth daily. 90 tablet 3   diclofenac sodium (VOLTAREN) 1 % GEL Apply 4 g topically 4 (four) times daily. 300 g 0   gabapentin (NEURONTIN) 300 MG capsule Take 1-2 capsules (300-600 mg total) by mouth at bedtime. 30 capsule 1   metoprolol tartrate (LOPRESSOR) 100 MG tablet Take 1 tablet (100 mg total) by mouth once for 1 dose. Please take one time dose '100mg'$  metoprolol tartrate 2 hr prior to cardiac CT for HR control IF HR >55bpm. 1 tablet 0   omeprazole (PRILOSEC) 40 MG capsule Take 1 capsule (40 mg total) by mouth daily. 90 capsule 0   potassium chloride (MICRO-K) 10 MEQ CR capsule Take 1 capsule (10 mEq total) by mouth daily. 90 capsule 0   rosuvastatin (CRESTOR) 5 MG tablet Take 1 tablet (5 mg total) by mouth daily. 90 tablet 3   tiZANidine (ZANAFLEX) 4 MG tablet Take 1 tablet (4 mg total) by mouth daily as needed for muscle spasms. 30 tablet 1   Ubrogepant (UBRELVY) 50 MG TABS Take 1-2 tablets by mouth daily as needed. 10 tablet 2   valsartan-hydrochlorothiazide (DIOVAN HCT) 160-25 MG tablet Take 1 tablet by mouth daily. 90 tablet 3   No current facility-administered medications for this visit.    Allergies:   Chocolate, Chocolate flavor, Peanut-containing drug products, and Pineapple    Social History:  The patient  reports that she has never smoked. She has never used smokeless tobacco. She reports that she does not drink alcohol and does not use drugs.   Family History:  The patient's family history includes Alcohol abuse in her maternal uncle and another family member; Arthritis in an other family member; Diabetes in her maternal aunt and mother; Early death in her mother; Early death (age of onset: 5) in her sister; Gout in her mother; Heart disease in an other family member; Hyperlipidemia in her maternal aunt; Hypertension in her maternal aunt and mother;  Kidney failure in her mother; Other in an other family member; Prostate cancer in her maternal uncle.    ROS:  Please see the history of present illness.   Otherwise, review of systems are positive for none.   All other systems are reviewed and negative.    PHYSICAL EXAM: VS:  BP 100/70 (BP Location: Left Arm, Patient Position: Sitting, Cuff Size: Large)   Pulse 82   Ht '5\' 2"'$  (1.575 m)   Wt 220 lb 4 oz (99.9 kg)   SpO2 99%   BMI 40.28 kg/m  , BMI Body mass index is 40.28 kg/m. GEN: Well nourished, well developed, in no acute distress  HEENT: normal  Neck: no JVD, carotid  bruits, or masses Cardiac: RRR; no murmurs, rubs, or gallops,no edema  Respiratory:  clear to auscultation bilaterally, normal work of breathing GI: soft, nontender, nondistended, + BS MS: no deformity or atrophy  Skin: warm and dry, no rash Neuro:  Strength and sensation are intact Psych: euthymic mood, full affect   EKG:  EKG is not ordered today.    Recent Labs: 12/23/2020: BUN 10; Creatinine, Ser 0.81; Hemoglobin 13.4; Platelets 293; Potassium 3.7; Sodium 144; TSH 0.755    Lipid Panel    Component Value Date/Time   CHOL 162 10/29/2019 1351   TRIG 191 (H) 10/29/2019 1351   HDL 48 10/29/2019 1351   CHOLHDL 3.4 10/29/2019 1351   VLDL 38 10/29/2019 1351   LDLCALC 76 10/29/2019 1351      Wt Readings from Last 3 Encounters:  02/10/21 220 lb 4 oz (99.9 kg)  12/23/20 224 lb 2 oz (101.7 kg)  09/15/20 217 lb (98.4 kg)       No flowsheet data found.    ASSESSMENT AND PLAN:  1.  Atypical chest pain and exertional dyspnea: Her chest pain is overall atypical with some pleuritic component.  Nonetheless, she clearly has a component of exertional chest pain and has dyspnea with minimal exertion which has been very frustrating to her.  She has been trying to increase her physical activities but has not been able to do so.  Cardiac CTA showed no evidence of obstructive coronary artery disease.  However,  the pulmonary artery was mildly dilated.  In addition, there was some degree of diastolic dysfunction on echocardiogram and pulmonary pressure could not be estimated.  Also she has untreated sleep apnea which puts her at high risk for pulmonary hypertension.  Based on all of this, I do think we have to exclude significant pulmonary hypertension and also significant heart failure contributing to her symptoms.  Thus, I recommend evaluation with a right heart catheterization.  I discussed the procedure in details as well as risks and benefits.  2.  Obstructive sleep apnea: She has not been using her CPAP and its an old machine.  She might need new referral to get this treated.  3.  Essential hypertension: The dose of Diovan hydrochlorothiazide was increased during the last visit.    Disposition:   FU with me in 1 month  Signed,  Kathlyn Sacramento, MD  02/10/2021 1:46 PM    [facility]

## 2021-02-20 ENCOUNTER — Telehealth: Payer: Self-pay | Admitting: Cardiovascular Disease

## 2021-02-20 NOTE — Telephone Encounter (Signed)
Called patient to discus. Lmtcb.

## 2021-02-20 NOTE — Telephone Encounter (Signed)
Patient calling to reschedule 8/8 cath.  She needs to reschedule late august early September .

## 2021-02-24 NOTE — Telephone Encounter (Signed)
Called patient back about her message. Patient stated she is out of town and would like to reschedule her heart cath to sometime in September. Informed patient that she would need to have an office visit within 30 days for any cath procedures. Patient scheduled to see Ignacia Bayley NP on 03/23/21. Will have her seen then and at that appointment patient can reschedule heart cath with Dr. Fletcher Anon in September. Patient verbalized understanding.

## 2021-02-24 NOTE — Telephone Encounter (Signed)
Patient is returning the call to reschedule her cath for 8/8

## 2021-02-27 ENCOUNTER — Encounter: Admission: RE | Payer: Self-pay | Source: Home / Self Care

## 2021-02-27 ENCOUNTER — Ambulatory Visit
Admission: RE | Admit: 2021-02-27 | Payer: Managed Care, Other (non HMO) | Source: Home / Self Care | Admitting: Cardiovascular Disease

## 2021-02-27 DIAGNOSIS — I272 Pulmonary hypertension, unspecified: Secondary | ICD-10-CM

## 2021-02-27 SURGERY — RIGHT HEART CATH
Anesthesia: Moderate Sedation | Laterality: Right

## 2021-03-23 ENCOUNTER — Ambulatory Visit: Payer: Managed Care, Other (non HMO) | Admitting: Nurse Practitioner

## 2021-04-14 ENCOUNTER — Ambulatory Visit (INDEPENDENT_AMBULATORY_CARE_PROVIDER_SITE_OTHER): Payer: Managed Care, Other (non HMO) | Admitting: Nurse Practitioner

## 2021-04-14 ENCOUNTER — Encounter: Payer: Self-pay | Admitting: Nurse Practitioner

## 2021-04-14 ENCOUNTER — Other Ambulatory Visit: Payer: Self-pay

## 2021-04-14 VITALS — BP 148/72 | HR 91 | Ht 62.0 in | Wt 221.2 lb

## 2021-04-14 DIAGNOSIS — G4733 Obstructive sleep apnea (adult) (pediatric): Secondary | ICD-10-CM

## 2021-04-14 DIAGNOSIS — Z01818 Encounter for other preprocedural examination: Secondary | ICD-10-CM | POA: Diagnosis not present

## 2021-04-14 DIAGNOSIS — I10 Essential (primary) hypertension: Secondary | ICD-10-CM

## 2021-04-14 DIAGNOSIS — R0600 Dyspnea, unspecified: Secondary | ICD-10-CM | POA: Diagnosis not present

## 2021-04-14 DIAGNOSIS — R0609 Other forms of dyspnea: Secondary | ICD-10-CM

## 2021-04-14 DIAGNOSIS — I5032 Chronic diastolic (congestive) heart failure: Secondary | ICD-10-CM | POA: Diagnosis not present

## 2021-04-14 DIAGNOSIS — I272 Pulmonary hypertension, unspecified: Secondary | ICD-10-CM | POA: Diagnosis not present

## 2021-04-14 DIAGNOSIS — R072 Precordial pain: Secondary | ICD-10-CM

## 2021-04-14 MED ORDER — VALSARTAN-HYDROCHLOROTHIAZIDE 160-25 MG PO TABS: 1.0000 | ORAL_TABLET | Freq: Every day | ORAL | 3 refills | Status: DC

## 2021-04-14 MED ORDER — CARVEDILOL 3.125 MG PO TABS: 3.1250 mg | ORAL_TABLET | Freq: Two times a day (BID) | ORAL | 3 refills | Status: DC

## 2021-04-14 NOTE — H&P (View-Only) (Signed)
Cardiology Clinic Note   Patient Name: Marie Vaughan Date of Encounter: 04/14/2021  Primary Care Provider:  Steele Sizer, MD Primary Cardiologist:  Kathlyn Sacramento, MD  Patient Profile    50 year old female with a history of chest pain and normal coronary arteries (October 2015), hypertension, GERD, sleep apnea (not using CPAP), diastolic dysfunction, and obesity, who presents for follow-up related to DOE.  Past Medical History    Past Medical History:  Diagnosis Date   Acne    Acute bronchitis    Allergy    Angio-edema    Anxiety    Anxiety state, unspecified    Cervicalgia    Contact lens/glasses fitting    wears contacts or glasses   Diastolic dysfunction    a. 11/2015 Echo: EF 55-60%, no rwma; b. 01/2021 Echo: EF 55-60%, no rwma, GrI DD, nl RV size/fxn.   Dysmenorrhea    GERD (gastroesophageal reflux disease)    History of stress test    a. Lexiscan 08/25/13: no sig ischemia, attenuation artifact noted, no sig WMA noted, EF 69%, no EKG changes concerning for ischemia   HTN (hypertension)    Iron deficiency anemia secondary to blood loss (chronic)    Migraine with aura, without mention of intractable migraine without mention of status migrainosus    "vestibular" per pt   Non-obstructive CAD (coronary artery disease)    a. 04/29/2014: LM nl, LAD no dz, D1 no dz, D2 very small vessel, D3 very small vessel, LCx nl, OM1 very small vessel, OM2 medium sized vessel, OM 3 no dz, RCA no dz, PDA no dzs; b. 12/2020 Cor CTA: Cor CA2+ = 21.8 (92nd%'ile), LM nl, LAD 1-24%p, D1 nl, LCX nl, RCA nl. Ao nl size w/o Ca2+, main PA dilated @ 68mm (? PAH).   Obesity    Other disorders of bone and cartilage(733.99)    Pain in thoracic spine    Pharyngitis    Tear of medial meniscus of left knee    Tear of medial meniscus of right knee    Vaginal delivery    x 3   Wears contact lenses    Past Surgical History:  Procedure Laterality Date   ABDOMINAL HYSTERECTOMY  04-25-10   Due to abnormal  PAP   CARDIAC CATHETERIZATION  04/2014   [facility]   COLONOSCOPY WITH PROPOFOL N/A 07/11/2017   Procedure: COLONOSCOPY WITH PROPOFOL;  Surgeon: [name], MD;  Location: [place];  Service: Endoscopy;  Laterality: N/A;   DILATION AND CURETTAGE OF UTERUS     ESOPHAGOGASTRODUODENOSCOPY (EGD) WITH PROPOFOL N/A 07/11/2017   Procedure: ESOPHAGOGASTRODUODENOSCOPY (EGD) WITH PROPOFOL;  Surgeon: [name], MD;  Location: [place];  Service: Endoscopy;  Laterality: N/A;  diabetic - diet controlled   GIVENS CAPSULE STUDY N/A 09/23/2017   Procedure: GIVENS CAPSULE STUDY;  Surgeon: Virgel Manifold, MD;  Location: [facility];  Service: Endoscopy;  Laterality: N/A;   KNEE ARTHROSCOPY Left 06/10/2013   Procedure: LEFT KNEE ARTHROSCOPY KNEE WITH PARTIAL MEDIAL MENISCECTOMY ;  Surgeon: Lorn Junes, MD;  Location: [place];  Service: Orthopedics;  Laterality: Left;  xerofoam, 4x4's, webril, ace wrap, ice wrap   KNEE ARTHROSCOPY WITH MEDIAL MENISECTOMY Right 06/10/2013   Procedure: RIGHT KNEE ARTHROSCOPY WITH MEDIAL MENISECTOMY;  Surgeon: Lorn Junes, MD;  Location: [place];  Service: Orthopedics;  Laterality: Right;  partial lateral menisectomoy and chondroplasty   OVARIAN CYST REMOVAL  04-25-10    Allergies  Allergies  Allergen  Reactions   Chocolate     Hives, itching   Chocolate Flavor Other (See Comments)    Hives, itching   Peanut-Containing Drug Products Swelling    cashews   Pineapple     History of Present Illness     50 year old female with the above past medical history including chest pain, hypertension, GERD, obesity, anemia, sleep apnea (not using CPAP), chronic pain, and migraines.  She was previously evaluated in 2015 in the setting of chest pain.  She initially underwent stress testing, which was low risk.  She continued to have chest pain and underwent diagnostic catheterization October 2015, showing normal coronary  arteries and normal LV function.  Subsequent echocardiogram in 2017 and again showed normal LV function without any significant valvular disease.  She was seen in cardiology clinic in June 2022 at which time she reported dyspnea with minimal activity and intermittent rest and exertional chest pain, sometimes worsened with deep breathing.  Chest pain was lasting anywhere between 20 minutes and several hours.  Coronary CT angiography was performed with findings of a coronary calcium score of 21.8 (92nd percentile), and minimal (1 to 24%) LAD disease.  The main pulmonary artery was dilated at 28 mm.  Aggressive risk factor modification was recommended.  At follow-up visit on July 22, she continued reports significant exertional dyspnea and intermittent chest pain.  In light of diastolic dysfunction, dilated pulmonary artery, and ongoing symptoms, recommendation was made for right heart catheterization, which was initially scheduled but then canceled, as patient was out of town.  Since her last visit, patient has continued to have dyspnea on exertion.  She notes just walking from her car, across the parking lot into work at [employer], she needs to rest upon arrival.  She has had intermittent lightheadedness and daytime somnolence.  She does have a history of sleep apnea but has not worn her CPAP in many many years.  She continues to experience intermittent pleuritic type chest discomfort associated with dyspnea.  Discomfort usually occurs after dyspnea.  She denies palpitations, PND, orthopnea, syncope, edema, or early satiety.  She is now interested in pursuing right heart catheterization.  Home Medications    Current Outpatient Medications  Medication Sig Dispense Refill   aspirin EC 81 MG tablet Take 1 tablet (81 mg total) by mouth daily. 90 tablet 3   carvedilol (COREG) 3.125 MG tablet Take 1 tablet (3.125 mg total) by mouth 2 (two) times daily. 180 tablet 3   diclofenac sodium (VOLTAREN) 1 % GEL  Apply 4 g topically 4 (four) times daily. 300 g 0   gabapentin (NEURONTIN) 300 MG capsule Take 1-2 capsules (300-600 mg total) by mouth at bedtime. 30 capsule 1   omeprazole (PRILOSEC) 40 MG capsule Take 1 capsule (40 mg total) by mouth daily. 90 capsule 0   potassium chloride (MICRO-K) 10 MEQ CR capsule Take 1 capsule (10 mEq total) by mouth daily. 90 capsule 0   tiZANidine (ZANAFLEX) 4 MG tablet Take 1 tablet (4 mg total) by mouth daily as needed for muscle spasms. 30 tablet 1   Ubrogepant (UBRELVY) 50 MG TABS Take 1-2 tablets by mouth daily as needed. 10 tablet 2   rosuvastatin (CRESTOR) 5 MG tablet Take 1 tablet (5 mg total) by mouth daily. 90 tablet 3   valsartan-hydrochlorothiazide (DIOVAN HCT) 160-25 MG tablet Take 1 tablet by mouth daily. 90 tablet 3   No current facility-administered medications for this visit.     Family History  Family History  Problem Relation Age of Onset   Early death Mother        Murdered when pt was 59 years old   Diabetes Mother    Hypertension Mother    Gout Mother    Kidney failure Mother    Early death Sister 69       due to hemorage   Alcohol abuse Maternal Uncle    Alcohol abuse Other    Arthritis Other    Heart disease Other    Other Other        cousin- phlebitis   Diabetes Maternal Aunt    Hyperlipidemia Maternal Aunt    Hypertension Maternal Aunt    Prostate cancer Maternal Uncle    Breast cancer Neg Hx    She indicated that her mother is deceased. She indicated that her father is deceased. She indicated that two of her three sisters are alive. She indicated that the status of her brother is unknown. She indicated that both of her daughters are alive. She indicated that the status of her maternal aunt is unknown. She indicated that the status of her neg hx is unknown. She indicated that the status of her other is unknown.  Social History    Social History   Socioeconomic History   Marital status: Married    Spouse name: Not on  file   Number of children: 3   Years of education: Not on file   Highest education level: Not on file  Occupational History   Occupation: Acupuncturist     Employer: [employer]  Tobacco Use   Smoking status: Never   Smokeless tobacco: Never  Vaping Use   Vaping Use: Never used  Substance and Sexual Activity   Alcohol use: No    Alcohol/week: 0.0 standard drinks   Drug use: No   Sexual activity: Yes    Partners: Male    Birth control/protection: Surgical  Other Topics Concern   Not on file  Social History Narrative   Lives in [place] with husband, two children gone . Daughter's are grown and out of the house. Her 51 yo is going to college            Social Determinants of Radio broadcast assistant Strain: Not on file  Food Insecurity: Not on file  Transportation Needs: Not on file  Physical Activity: Not on file  Stress: Not on file  Social Connections: Not on file  Intimate Partner Violence: Not on file     Review of Systems    General:  No chills, fever, night sweats or weight changes.  Cardiovascular:  +++ chest pain, +++ dyspnea on exertion, no edema, orthopnea, palpitations, paroxysmal nocturnal dyspnea. Dermatological: No rash, lesions/masses Respiratory: No cough, +++ dyspnea Urologic: No hematuria, dysuria Abdominal:   No nausea, vomiting, diarrhea, bright red blood per rectum, melena, or hematemesis Neurologic:  No visual changes, wkns, changes in mental status. All other systems reviewed and are otherwise negative except as noted above.  Physical Exam    VS:  BP (!) 148/72   Pulse 91   Ht 5\' 2"  (1.575 m)   Wt 221 lb 3.2 oz (100.3 kg)   SpO2 96%   BMI 40.46 kg/m  , BMI Body mass index is 40.46 kg/m.     GEN: Well nourished, well developed, in no acute distress. HEENT: normal. Neck: Supple, obese, difficult to gauge JVP.  No carotid bruits, or masses. Cardiac: RRR, no murmurs, rubs, or  gallops. No clubbing, cyanosis, edema.  Radials/PT 2+  and equal bilaterally.  Respiratory:  Respirations regular and unlabored, clear to auscultation bilaterally. GI: Obese, soft, nontender, nondistended, BS + x 4. MS: no deformity or atrophy. Skin: warm and dry, no rash. Neuro:  Strength and sensation are intact. Psych: Normal affect.  Accessory Clinical Findings    ECG personally reviewed by me today-regular sinus rhythm, 91, nonspecific T changes- No acute changes  Lab Results  Component Value Date   WBC 5.7 12/23/2020   HGB 13.4 12/23/2020   HCT 40.0 12/23/2020   MCV 83 12/23/2020   PLT 293 12/23/2020   Lab Results  Component Value Date   CREATININE 0.81 12/23/2020   BUN 10 12/23/2020   NA 144 12/23/2020   K 3.7 12/23/2020   CL 104 12/23/2020   CO2 22 12/23/2020   Lab Results  Component Value Date   ALT 22 10/29/2019   AST 20 10/29/2019   ALKPHOS 82 10/29/2019   BILITOT 0.5 10/29/2019   Lab Results  Component Value Date   CHOL 162 10/29/2019   HDL 48 10/29/2019   LDLCALC 76 10/29/2019   TRIG 191 (H) 10/29/2019   CHOLHDL 3.4 10/29/2019    Lab Results  Component Value Date   HGBA1C 6.2 (H) 10/29/2019    Assessment & Plan   1.  Dyspnea on exertion/chronic HFpEF: Patient now with a long history of dyspnea on exertion and also rest and exertional chest pain, which predominantly has a pleuritic component.  Previous testing included diagnostic catheterization in 2015, which revealed normal coronary arteries, and more recently echocardiogram in July 2022 showing normal LV function with grade 1 diastolic dysfunction, and coronary CT angiography showing minimal LAD disease with a mildly dilated pulmonary artery.  She was subsequently set up for right heart catheterization in the setting of possible pulmonary hypertension, diastolic dysfunction, and ongoing dyspnea, however patient had to cancel.  Since July, she has continued to have dyspnea on exertion which has been somewhat progressive.  She also has pleuritic chest  discomfort.  She is now interested in pursuing right heart catheterization and we will arrange for this today.  I discussed the procedure in detail as well as risks and benefits, and she wishes to proceed.  Adding carvedilol in the setting of elevated heart rate and ongoing hypertension.  Right heart catheterization unremarkable, will need to consider pulmonology evaluation as well as CPX testing.  2.  Essential hypertension: Blood pressure is elevated today.  When she checks at home or at work, she gets similar readings.  She is currently on valsartan HCTZ 160-25 mg daily.  Her heart rate is elevated today at 91 bpm.  I am going to add carvedilol 3.125 mg twice daily to her regimen.  3.  Obstructive sleep apnea: This was diagnosed sometime ago however she is not using CPAP and has not used CPAP in a long time.  She says she is waiting on supplies to arrive but has been waiting for several months.  We discussed that pending right heart cath results, a new pulmonology referral might be appropriate.  4.  Chest pain: Patient with rest and exertional chest pain that also has a pleuritic component.  As noted above, prior diagnostic catheterization without significant coronary disease.  More recently, coronary CT angiography with minimal LAD disease.  Reassurance provided.  5.  Morbid obesity: Deconditioning and obesity is likely playing a role in her dyspnea on exertion.  Exercise recommendations on hold pending right  heart catheterization.  6.  Disposition: Plan for right heart catheterization on Friday, October 7.  Follow-up in clinic 2 weeks post catheterization.  Murray Hodgkins, NP 04/14/2021, 5:22 PM

## 2021-04-14 NOTE — Progress Notes (Signed)
Cardiology Clinic Note   Patient Name: Marie Vaughan Date of Encounter: 04/14/2021  Primary Care Provider:  Steele Sizer, MD Primary Cardiologist:  Kathlyn Sacramento, MD  Patient Profile    50 year old female with a history of chest pain and normal coronary arteries (October 2015), hypertension, GERD, sleep apnea (not using CPAP), diastolic dysfunction, and obesity, who presents for follow-up related to DOE.  Past Medical History    Past Medical History:  Diagnosis Date   Acne    Acute bronchitis    Allergy    Angio-edema    Anxiety    Anxiety state, unspecified    Cervicalgia    Contact lens/glasses fitting    wears contacts or glasses   Diastolic dysfunction    a. 11/2015 Echo: EF 55-60%, no rwma; b. 01/2021 Echo: EF 55-60%, no rwma, GrI DD, nl RV size/fxn.   Dysmenorrhea    GERD (gastroesophageal reflux disease)    History of stress test    a. Lexiscan 08/25/13: no sig ischemia, attenuation artifact noted, no sig WMA noted, EF 69%, no EKG changes concerning for ischemia   HTN (hypertension)    Iron deficiency anemia secondary to blood loss (chronic)    Migraine with aura, without mention of intractable migraine without mention of status migrainosus    "vestibular" per pt   Non-obstructive CAD (coronary artery disease)    a. 04/29/2014: LM nl, LAD no dz, D1 no dz, D2 very small vessel, D3 very small vessel, LCx nl, OM1 very small vessel, OM2 medium sized vessel, OM 3 no dz, RCA no dz, PDA no dzs; b. 12/2020 Cor CTA: Cor CA2+ = 21.8 (92nd%'ile), LM nl, LAD 1-24%p, D1 nl, LCX nl, RCA nl. Ao nl size w/o Ca2+, main PA dilated @ 14mm (? PAH).   Obesity    Other disorders of bone and cartilage(733.99)    Pain in thoracic spine    Pharyngitis    Tear of medial meniscus of left knee    Tear of medial meniscus of right knee    Vaginal delivery    x 3   Wears contact lenses    Past Surgical History:  Procedure Laterality Date   ABDOMINAL HYSTERECTOMY  04-25-10   Due to abnormal  PAP   CARDIAC CATHETERIZATION  04/2014   [facility]   COLONOSCOPY WITH PROPOFOL N/A 07/11/2017   Procedure: COLONOSCOPY WITH PROPOFOL;  Surgeon: [name], MD;  Location: [place];  Service: Endoscopy;  Laterality: N/A;   DILATION AND CURETTAGE OF UTERUS     ESOPHAGOGASTRODUODENOSCOPY (EGD) WITH PROPOFOL N/A 07/11/2017   Procedure: ESOPHAGOGASTRODUODENOSCOPY (EGD) WITH PROPOFOL;  Surgeon: [name], MD;  Location: [place];  Service: Endoscopy;  Laterality: N/A;  diabetic - diet controlled   GIVENS CAPSULE STUDY N/A 09/23/2017   Procedure: GIVENS CAPSULE STUDY;  Surgeon: Virgel Manifold, MD;  Location: [facility];  Service: Endoscopy;  Laterality: N/A;   KNEE ARTHROSCOPY Left 06/10/2013   Procedure: LEFT KNEE ARTHROSCOPY KNEE WITH PARTIAL MEDIAL MENISCECTOMY ;  Surgeon: Lorn Junes, MD;  Location: [place];  Service: Orthopedics;  Laterality: Left;  xerofoam, 4x4's, webril, ace wrap, ice wrap   KNEE ARTHROSCOPY WITH MEDIAL MENISECTOMY Right 06/10/2013   Procedure: RIGHT KNEE ARTHROSCOPY WITH MEDIAL MENISECTOMY;  Surgeon: Lorn Junes, MD;  Location: [place];  Service: Orthopedics;  Laterality: Right;  partial lateral menisectomoy and chondroplasty   OVARIAN CYST REMOVAL  04-25-10    Allergies  Allergies  Allergen  Reactions   Chocolate     Hives, itching   Chocolate Flavor Other (See Comments)    Hives, itching   Peanut-Containing Drug Products Swelling    cashews   Pineapple     History of Present Illness     50 year old female with the above past medical history including chest pain, hypertension, GERD, obesity, anemia, sleep apnea (not using CPAP), chronic pain, and migraines.  She was previously evaluated in 2015 in the setting of chest pain.  She initially underwent stress testing, which was low risk.  She continued to have chest pain and underwent diagnostic catheterization October 2015, showing normal coronary  arteries and normal LV function.  Subsequent echocardiogram in 2017 and again showed normal LV function without any significant valvular disease.  She was seen in cardiology clinic in June 2022 at which time she reported dyspnea with minimal activity and intermittent rest and exertional chest pain, sometimes worsened with deep breathing.  Chest pain was lasting anywhere between 20 minutes and several hours.  Coronary CT angiography was performed with findings of a coronary calcium score of 21.8 (92nd percentile), and minimal (1 to 24%) LAD disease.  The main pulmonary artery was dilated at 28 mm.  Aggressive risk factor modification was recommended.  At follow-up visit on July 22, she continued reports significant exertional dyspnea and intermittent chest pain.  In light of diastolic dysfunction, dilated pulmonary artery, and ongoing symptoms, recommendation was made for right heart catheterization, which was initially scheduled but then canceled, as patient was out of town.  Since her last visit, patient has continued to have dyspnea on exertion.  She notes just walking from her car, across the parking lot into work at [facility] [employer] [facility], she needs to rest upon arrival.  She has had intermittent lightheadedness and daytime somnolence.  She does have a history of sleep apnea but has not worn her CPAP in many many years.  She continues to experience intermittent pleuritic type chest discomfort associated with dyspnea.  Discomfort usually occurs after dyspnea.  She denies palpitations, PND, orthopnea, syncope, edema, or early satiety.  She is now interested in pursuing right heart catheterization.  Home Medications    Current Outpatient Medications  Medication Sig Dispense Refill   aspirin EC 81 MG tablet Take 1 tablet (81 mg total) by mouth daily. 90 tablet 3   carvedilol (COREG) 3.125 MG tablet Take 1 tablet (3.125 mg total) by mouth 2 (two) times daily. 180 tablet 3   diclofenac sodium (VOLTAREN) 1 % GEL  Apply 4 g topically 4 (four) times daily. 300 g 0   gabapentin (NEURONTIN) 300 MG capsule Take 1-2 capsules (300-600 mg total) by mouth at bedtime. 30 capsule 1   omeprazole (PRILOSEC) 40 MG capsule Take 1 capsule (40 mg total) by mouth daily. 90 capsule 0   potassium chloride (MICRO-K) 10 MEQ CR capsule Take 1 capsule (10 mEq total) by mouth daily. 90 capsule 0   tiZANidine (ZANAFLEX) 4 MG tablet Take 1 tablet (4 mg total) by mouth daily as needed for muscle spasms. 30 tablet 1   Ubrogepant (UBRELVY) 50 MG TABS Take 1-2 tablets by mouth daily as needed. 10 tablet 2   rosuvastatin (CRESTOR) 5 MG tablet Take 1 tablet (5 mg total) by mouth daily. 90 tablet 3   valsartan-hydrochlorothiazide (DIOVAN HCT) 160-25 MG tablet Take 1 tablet by mouth daily. 90 tablet 3   No current facility-administered medications for this visit.     Family History  Family History  Problem Relation Age of Onset   Early death Mother        Murdered when pt was 40 years old   Diabetes Mother    Hypertension Mother    Gout Mother    Kidney failure Mother    Early death Sister 58       due to hemorage   Alcohol abuse Maternal Uncle    Alcohol abuse Other    Arthritis Other    Heart disease Other    Other Other        cousin- phlebitis   Diabetes Maternal Aunt    Hyperlipidemia Maternal Aunt    Hypertension Maternal Aunt    Prostate cancer Maternal Uncle    Breast cancer Neg Hx    She indicated that her mother is deceased. She indicated that her father is deceased. She indicated that two of her three sisters are alive. She indicated that the status of her brother is unknown. She indicated that both of her daughters are alive. She indicated that the status of her maternal aunt is unknown. She indicated that the status of her neg hx is unknown. She indicated that the status of her other is unknown.  Social History    Social History   Socioeconomic History   Marital status: Married    Spouse name: Not on  file   Number of children: 3   Years of education: Not on file   Highest education level: Not on file  Occupational History   Occupation: Acupuncturist     Employer: [employer]  Tobacco Use   Smoking status: Never   Smokeless tobacco: Never  Vaping Use   Vaping Use: Never used  Substance and Sexual Activity   Alcohol use: No    Alcohol/week: 0.0 standard drinks   Drug use: No   Sexual activity: Yes    Partners: Male    Birth control/protection: Surgical  Other Topics Concern   Not on file  Social History Narrative   Lives in [place] with husband, two children gone . Daughter's are grown and out of the house. Her 51 yo is going to college            Social Determinants of Radio broadcast assistant Strain: Not on file  Food Insecurity: Not on file  Transportation Needs: Not on file  Physical Activity: Not on file  Stress: Not on file  Social Connections: Not on file  Intimate Partner Violence: Not on file     Review of Systems    General:  No chills, fever, night sweats or weight changes.  Cardiovascular:  +++ chest pain, +++ dyspnea on exertion, no edema, orthopnea, palpitations, paroxysmal nocturnal dyspnea. Dermatological: No rash, lesions/masses Respiratory: No cough, +++ dyspnea Urologic: No hematuria, dysuria Abdominal:   No nausea, vomiting, diarrhea, bright red blood per rectum, melena, or hematemesis Neurologic:  No visual changes, wkns, changes in mental status. All other systems reviewed and are otherwise negative except as noted above.  Physical Exam    VS:  BP (!) 148/72   Pulse 91   Ht 5\' 2"  (1.575 m)   Wt 221 lb 3.2 oz (100.3 kg)   SpO2 96%   BMI 40.46 kg/m  , BMI Body mass index is 40.46 kg/m.     GEN: Well nourished, well developed, in no acute distress. HEENT: normal. Neck: Supple, obese, difficult to gauge JVP.  No carotid bruits, or masses. Cardiac: RRR, no murmurs, rubs, or  gallops. No clubbing, cyanosis, edema.  Radials/PT 2+  and equal bilaterally.  Respiratory:  Respirations regular and unlabored, clear to auscultation bilaterally. GI: Obese, soft, nontender, nondistended, BS + x 4. MS: no deformity or atrophy. Skin: warm and dry, no rash. Neuro:  Strength and sensation are intact. Psych: Normal affect.  Accessory Clinical Findings    ECG personally reviewed by me today-regular sinus rhythm, 91, nonspecific T changes- No acute changes  Lab Results  Component Value Date   WBC 5.7 12/23/2020   HGB 13.4 12/23/2020   HCT 40.0 12/23/2020   MCV 83 12/23/2020   PLT 293 12/23/2020   Lab Results  Component Value Date   CREATININE 0.81 12/23/2020   BUN 10 12/23/2020   NA 144 12/23/2020   K 3.7 12/23/2020   CL 104 12/23/2020   CO2 22 12/23/2020   Lab Results  Component Value Date   ALT 22 10/29/2019   AST 20 10/29/2019   ALKPHOS 82 10/29/2019   BILITOT 0.5 10/29/2019   Lab Results  Component Value Date   CHOL 162 10/29/2019   HDL 48 10/29/2019   LDLCALC 76 10/29/2019   TRIG 191 (H) 10/29/2019   CHOLHDL 3.4 10/29/2019    Lab Results  Component Value Date   HGBA1C 6.2 (H) 10/29/2019    Assessment & Plan   1.  Dyspnea on exertion/chronic HFpEF: Patient now with a long history of dyspnea on exertion and also rest and exertional chest pain, which predominantly has a pleuritic component.  Previous testing included diagnostic catheterization in 2015, which revealed normal coronary arteries, and more recently echocardiogram in July 2022 showing normal LV function with grade 1 diastolic dysfunction, and coronary CT angiography showing minimal LAD disease with a mildly dilated pulmonary artery.  She was subsequently set up for right heart catheterization in the setting of possible pulmonary hypertension, diastolic dysfunction, and ongoing dyspnea, however patient had to cancel.  Since July, she has continued to have dyspnea on exertion which has been somewhat progressive.  She also has pleuritic chest  discomfort.  She is now interested in pursuing right heart catheterization and we will arrange for this today.  I discussed the procedure in detail as well as risks and benefits, and she wishes to proceed.  Adding carvedilol in the setting of elevated heart rate and ongoing hypertension.  Right heart catheterization unremarkable, will need to consider pulmonology evaluation as well as CPX testing.  2.  Essential hypertension: Blood pressure is elevated today.  When she checks at home or at work, she gets similar readings.  She is currently on valsartan HCTZ 160-25 mg daily.  Her heart rate is elevated today at 91 bpm.  I am going to add carvedilol 3.125 mg twice daily to her regimen.  3.  Obstructive sleep apnea: This was diagnosed sometime ago however she is not using CPAP and has not used CPAP in a long time.  She says she is waiting on supplies to arrive but has been waiting for several months.  We discussed that pending right heart cath results, a new pulmonology referral might be appropriate.  4.  Chest pain: Patient with rest and exertional chest pain that also has a pleuritic component.  As noted above, prior diagnostic catheterization without significant coronary disease.  More recently, coronary CT angiography with minimal LAD disease.  Reassurance provided.  5.  Morbid obesity: Deconditioning and obesity is likely playing a role in her dyspnea on exertion.  Exercise recommendations on hold pending right  heart catheterization.  6.  Disposition: Plan for right heart catheterization on Friday, October 7.  Follow-up in clinic 2 weeks post catheterization.  Murray Hodgkins, NP 04/14/2021, 5:22 PM

## 2021-04-14 NOTE — Patient Instructions (Addendum)
Medication Instructions:  Your physician has recommended you make the following change in your medication:   START Carvedilol (Coreg) 3.125 mg twice a day  *If you need a refill on your cardiac medications before your next appointment, please call your pharmacy*   Lab Work: CBC, BMET today  If you have labs (blood work) drawn today and your tests are completely normal, you will receive your results only by: [facility] [facility] (if you have MyChart) OR A paper copy in the mail If you have any lab test that is abnormal or we need to change your treatment, we will call you to review the results.   Testing/Procedures: [facility] Cardiac Cath Instructions  You are scheduled for a Cardiac Cath on: Friday April 28, 2021 with Dr. Jacqulyn Cane Please arrive at 06:30 am on the day of your procedure Please expect a call from our [facility] to pre-register you Do not eat/drink anything after midnight Someone will need to drive you home It is required to have someone be with you for the first 24 hours after your procedure Wear clothes that are easy to get on/off and wear slip on shoes if possible   Medications bring a current list of all medications with you  _XX__ You may take all of your medications the morning of your procedure with enough water to swallow safely    Day of your procedure: Arrive at the [facility] entrance.  Free valet service is available.  After entering the [facility] please check-in at the registration desk (1st desk on your right) to receive your armband. After receiving your armband someone will escort you to the cardiac cath/special procedures waiting area.  The usual length of stay after your procedure is about 2 to 3 hours.  This can vary.  If you have any questions, please call our office at [phone], or you may call the cardiac cath lab at [facility] directly at [phone]   Follow-Up: At [facility], you and your health needs are our  priority.  As part of our continuing mission to provide you with exceptional heart care, we have created designated Provider Care Teams.  These Care Teams include your primary Cardiologist (physician) and Advanced Practice Providers (APPs -  Physician Assistants and Nurse Practitioners) who all work together to provide you with the care you need, when you need it.   Your next appointment:   4 week(s)  The format for your next appointment:   In Person  Provider:   Kathlyn Sacramento, MD or Murray Hodgkins, NP

## 2021-04-15 LAB — CBC
Hematocrit: 39.2 % (ref 34.0–46.6)
Hemoglobin: 13 g/dL (ref 11.1–15.9)
MCH: 27.3 pg (ref 26.6–33.0)
MCHC: 33.2 g/dL (ref 31.5–35.7)
MCV: 82 fL (ref 79–97)
Platelets: 311 10*3/uL (ref 150–450)
RBC: 4.77 x10E6/uL (ref 3.77–5.28)
RDW: 12.9 % (ref 11.7–15.4)
WBC: 8 10*3/uL (ref 3.4–10.8)

## 2021-04-15 LAB — BASIC METABOLIC PANEL
BUN/Creatinine Ratio: 16 (ref 9–23)
BUN: 12 mg/dL (ref 6–24)
CO2: 25 mmol/L (ref 20–29)
Calcium: 10 mg/dL (ref 8.7–10.2)
Chloride: 101 mmol/L (ref 96–106)
Creatinine, Ser: 0.74 mg/dL (ref 0.57–1.00)
Glucose: 85 mg/dL (ref 65–99)
Potassium: 3.8 mmol/L (ref 3.5–5.2)
Sodium: 140 mmol/L (ref 134–144)
eGFR: 98 mL/min/{1.73_m2} (ref >59)

## 2021-04-28 ENCOUNTER — Other Ambulatory Visit: Payer: Self-pay

## 2021-04-28 ENCOUNTER — Encounter: Payer: Self-pay | Admitting: Cardiovascular Disease

## 2021-04-28 ENCOUNTER — Encounter
Admission: RE | Disposition: A | Discharge status: Home / Self Care | Payer: Self-pay | Source: Home / Self Care | Attending: Cardiovascular Disease

## 2021-04-28 ENCOUNTER — Ambulatory Visit
Admission: RE | Admit: 2021-04-28 | Discharge: 2021-04-28 | Disposition: A | Discharge status: Home / Self Care | Payer: Managed Care, Other (non HMO) | Attending: Cardiovascular Disease | Admitting: Cardiovascular Disease

## 2021-04-28 DIAGNOSIS — Z79899 Other long term (current) drug therapy: Secondary | ICD-10-CM | POA: Insufficient documentation

## 2021-04-28 DIAGNOSIS — I272 Pulmonary hypertension, unspecified: Secondary | ICD-10-CM

## 2021-04-28 DIAGNOSIS — R0789 Other chest pain: Secondary | ICD-10-CM | POA: Diagnosis not present

## 2021-04-28 DIAGNOSIS — Z7982 Long term (current) use of aspirin: Secondary | ICD-10-CM | POA: Diagnosis not present

## 2021-04-28 DIAGNOSIS — Z6841 Body Mass Index (BMI) 40.0 and over, adult: Secondary | ICD-10-CM | POA: Insufficient documentation

## 2021-04-28 DIAGNOSIS — G4733 Obstructive sleep apnea (adult) (pediatric): Secondary | ICD-10-CM | POA: Insufficient documentation

## 2021-04-28 DIAGNOSIS — K219 Gastro-esophageal reflux disease without esophagitis: Secondary | ICD-10-CM | POA: Insufficient documentation

## 2021-04-28 DIAGNOSIS — I5032 Chronic diastolic (congestive) heart failure: Secondary | ICD-10-CM | POA: Diagnosis not present

## 2021-04-28 DIAGNOSIS — Z8249 Family history of ischemic heart disease and other diseases of the circulatory system: Secondary | ICD-10-CM | POA: Insufficient documentation

## 2021-04-28 DIAGNOSIS — I110 Hypertensive heart disease with heart failure: Secondary | ICD-10-CM | POA: Insufficient documentation

## 2021-04-28 DIAGNOSIS — R0609 Other forms of dyspnea: Secondary | ICD-10-CM | POA: Diagnosis not present

## 2021-04-28 DIAGNOSIS — Z791 Long term (current) use of non-steroidal anti-inflammatories (NSAID): Secondary | ICD-10-CM | POA: Insufficient documentation

## 2021-04-28 HISTORY — PX: RIGHT HEART CATH: CATH118263

## 2021-04-28 SURGERY — RIGHT HEART CATH
Anesthesia: Moderate Sedation | Laterality: Right

## 2021-04-28 MED ORDER — MIDAZOLAM HCL 2 MG/2ML IJ SOLN
INTRAMUSCULAR | Status: AC
  Filled 2021-04-28: qty 2

## 2021-04-28 MED ORDER — SODIUM CHLORIDE 0.9% FLUSH: 3.0000 mL | INTRAVENOUS | Status: DC | PRN

## 2021-04-28 MED ORDER — SODIUM CHLORIDE 0.9% FLUSH: 3.0000 mL | Freq: Two times a day (BID) | INTRAVENOUS | Status: DC

## 2021-04-28 MED ORDER — SODIUM CHLORIDE 0.9 % IV SOLN: INTRAVENOUS | Status: DC

## 2021-04-28 MED ORDER — MIDAZOLAM HCL 2 MG/2ML IJ SOLN
INTRAMUSCULAR | Status: DC | PRN
  Administered 2021-04-28: 1 mg via INTRAVENOUS

## 2021-04-28 MED ORDER — SODIUM CHLORIDE 0.9 % IV SOLN: 250.0000 mL | INTRAVENOUS | Status: DC | PRN

## 2021-04-28 MED ORDER — HEPARIN (PORCINE) IN NACL 1000-0.9 UT/500ML-% IV SOLN
INTRAVENOUS | Status: DC | PRN
  Administered 2021-04-28: 500 mL

## 2021-04-28 MED ORDER — ACETAMINOPHEN 325 MG PO TABS
ORAL_TABLET | ORAL | Status: AC
  Filled 2021-04-28: qty 2

## 2021-04-28 MED ORDER — HEPARIN (PORCINE) IN NACL 1000-0.9 UT/500ML-% IV SOLN
INTRAVENOUS | Status: AC
  Filled 2021-04-28: qty 1000

## 2021-04-28 MED ORDER — ACETAMINOPHEN 325 MG PO TABS
650.0000 mg | ORAL_TABLET | ORAL | Status: DC | PRN
  Administered 2021-04-28: 650 mg via ORAL

## 2021-04-28 MED ORDER — LIDOCAINE HCL (PF) 1 % IJ SOLN
INTRAMUSCULAR | Status: DC | PRN
  Administered 2021-04-28: 2 mL

## 2021-04-28 MED ORDER — FENTANYL CITRATE (PF) 100 MCG/2ML IJ SOLN
INTRAMUSCULAR | Status: AC
  Filled 2021-04-28: qty 2

## 2021-04-28 MED ORDER — SODIUM CHLORIDE 0.9 % IV SOLN
250.0000 mL | INTRAVENOUS | Status: DC | PRN
  Administered 2021-04-28: 1000 mL via INTRAVENOUS

## 2021-04-28 MED ORDER — LIDOCAINE HCL 1 % IJ SOLN
INTRAMUSCULAR | Status: AC
  Filled 2021-04-28: qty 20

## 2021-04-28 MED ORDER — ONDANSETRON HCL 4 MG/2ML IJ SOLN: 4.0000 mg | Freq: Four times a day (QID) | INTRAMUSCULAR | Status: DC | PRN

## 2021-04-28 MED ORDER — FENTANYL CITRATE (PF) 100 MCG/2ML IJ SOLN
INTRAMUSCULAR | Status: DC | PRN
  Administered 2021-04-28: 25 ug via INTRAVENOUS

## 2021-04-28 SURGICAL SUPPLY — 6 items
CATH BALLN WEDGE 5F 110CM (CATHETERS) ×1 IMPLANT
DRAPE BRACHIAL (DRAPES) ×1 IMPLANT
GUIDEWIRE .025 260CM (WIRE) ×1 IMPLANT
KIT RIGHT HEART (MISCELLANEOUS) ×1 IMPLANT
PACK CARDIAC CATH (CUSTOM PROCEDURE TRAY) ×2 IMPLANT
SHEATH GLIDE SLENDER 4/5FR (SHEATH) ×1 IMPLANT

## 2021-04-28 NOTE — Interval H&P Note (Signed)
History and Physical Interval Note:  04/28/2021 7:42 AM  [name]  has presented today for surgery, with the diagnosis of RT Cath   Pulmonary hypertension.  The various methods of treatment have been discussed with the patient and family. After consideration of risks, benefits and other options for treatment, the patient has consented to  Procedure(s): RIGHT HEART CATH (Right) as a surgical intervention.  The patient's history has been reviewed, patient examined, no change in status, stable for surgery.  I have reviewed the patient's chart and labs.  Questions were answered to the patient's satisfaction.     Marie Vaughan

## 2021-05-01 ENCOUNTER — Encounter: Payer: Self-pay | Admitting: Cardiovascular Disease

## 2021-05-10 ENCOUNTER — Telehealth: Payer: Self-pay | Admitting: Cardiovascular Disease

## 2021-05-10 NOTE — Telephone Encounter (Signed)
Spoke with patient and she wanted to follow up on further testing. She does see pulmonology Dr. Raul Del over at [facility] clinic. Reviewed that we have her scheduled to come in next Friday to see APP here in our office and we can review the next steps with her care. Provider is currently out of the office but provided reassurance that we will discuss at her upcoming appointment. She was agreeable with this plan with no further questions at this time.

## 2021-05-10 NOTE — Telephone Encounter (Signed)
Per Ignacia Bayley, NP 04/14/21 o/v documentation.  "Right heart catheterization unremarkable, will need to consider pulmonology evaluation as well as CPX testing."  Patient has a f/u appt with Gerald Stabs on 05/19/21. Msg fwd to CB to advise.

## 2021-05-10 NOTE — Telephone Encounter (Signed)
Patient calling to discuss specifics of advice after cath to see a pulmonologist   Patient states she was told the was pressure issue in lungs but now she needs a specific number .   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   Please call cell to discuss .  If no ans can leave detailed msg on cell with pressure number.

## 2021-05-19 ENCOUNTER — Ambulatory Visit (INDEPENDENT_AMBULATORY_CARE_PROVIDER_SITE_OTHER): Payer: Managed Care, Other (non HMO) | Admitting: Nurse Practitioner

## 2021-05-19 ENCOUNTER — Other Ambulatory Visit: Payer: Self-pay

## 2021-05-19 ENCOUNTER — Encounter: Payer: Self-pay | Admitting: Nurse Practitioner

## 2021-05-19 VITALS — BP 126/80 | HR 90 | Ht 62.0 in | Wt 222.0 lb

## 2021-05-19 DIAGNOSIS — R0789 Other chest pain: Secondary | ICD-10-CM | POA: Diagnosis not present

## 2021-05-19 DIAGNOSIS — I5032 Chronic diastolic (congestive) heart failure: Secondary | ICD-10-CM | POA: Diagnosis not present

## 2021-05-19 DIAGNOSIS — G4733 Obstructive sleep apnea (adult) (pediatric): Secondary | ICD-10-CM

## 2021-05-19 DIAGNOSIS — I10 Essential (primary) hypertension: Secondary | ICD-10-CM

## 2021-05-19 NOTE — Progress Notes (Signed)
Office Visit    Patient Name: Marie Vaughan Date of Encounter: 05/19/2021  Primary Care Provider:  Steele Sizer, MD Primary Cardiologist:  Kathlyn Sacramento, MD  Chief Complaint    50 year old female with a history of chest pain and normal coronary arteries, hypertension, GERD, sleep apnea, diastolic dysfunction, and obesity, who presents for follow-up related to dyspnea on exertion.  Past Medical History    Past Medical History:  Diagnosis Date   Acne    Acute bronchitis    Allergy    Angio-edema    Anxiety    Anxiety state, unspecified    Cervicalgia    Chronic dyspnea    a. 04/2021 RHC: RA 12/9, RV 31/7 (13), PA 29/16 (22), PCWP 12, CO 5.16 L/min; CI 2.58 L/min/m.   Contact lens/glasses fitting    wears contacts or glasses   Diastolic dysfunction    a. 11/2015 Echo: EF 55-60%, no rwma; b. 01/2021 Echo: EF 55-60%, no rwma, GrI DD, nl RV size/fxn.   Dysmenorrhea    GERD (gastroesophageal reflux disease)    History of stress test    a. Lexiscan 08/25/13: no sig ischemia, attenuation artifact noted, no sig WMA noted, EF 69%, no EKG changes concerning for ischemia   HTN (hypertension)    Iron deficiency anemia secondary to blood loss (chronic)    Migraine with aura, without mention of intractable migraine without mention of status migrainosus    "vestibular" per pt   Non-obstructive CAD (coronary artery disease)    a. 04/29/2014: LM nl, LAD no dz, D1 no dz, D2 very small vessel, D3 very small vessel, LCx nl, OM1 very small vessel, OM2 medium sized vessel, OM 3 no dz, RCA no dz, PDA no dzs; b. 12/2020 Cor CTA: Cor CA2+ = 21.8 (92nd%'ile), LM nl, LAD 1-24%p, D1 nl, LCX nl, RCA nl. Ao nl size w/o Ca2+, main PA dilated @ 4mm (? PAH).   Obesity    Other disorders of bone and cartilage(733.99)    Pain in thoracic spine    Pharyngitis    Tear of medial meniscus of left knee    Tear of medial meniscus of right knee    Vaginal delivery    x 3   Wears contact lenses    Past  Surgical History:  Procedure Laterality Date   ABDOMINAL HYSTERECTOMY  04-25-10   Due to abnormal PAP   CARDIAC CATHETERIZATION  04/2014   [facility]   COLONOSCOPY WITH PROPOFOL N/A 07/11/2017   Procedure: COLONOSCOPY WITH PROPOFOL;  Surgeon: [name], MD;  Location: [place];  Service: Endoscopy;  Laterality: N/A;   DILATION AND CURETTAGE OF UTERUS     ESOPHAGOGASTRODUODENOSCOPY (EGD) WITH PROPOFOL N/A 07/11/2017   Procedure: ESOPHAGOGASTRODUODENOSCOPY (EGD) WITH PROPOFOL;  Surgeon: [name], MD;  Location: [place];  Service: Endoscopy;  Laterality: N/A;  diabetic - diet controlled   GIVENS CAPSULE STUDY N/A 09/23/2017   Procedure: GIVENS CAPSULE STUDY;  Surgeon: Virgel Manifold, MD;  Location: [facility];  Service: Endoscopy;  Laterality: N/A;   KNEE ARTHROSCOPY Left 06/10/2013   Procedure: LEFT KNEE ARTHROSCOPY KNEE WITH PARTIAL MEDIAL MENISCECTOMY ;  Surgeon: Lorn Junes, MD;  Location: [place];  Service: Orthopedics;  Laterality: Left;  xerofoam, 4x4's, webril, ace wrap, ice wrap   KNEE ARTHROSCOPY WITH MEDIAL MENISECTOMY Right 06/10/2013   Procedure: RIGHT KNEE ARTHROSCOPY WITH MEDIAL MENISECTOMY;  Surgeon: Lorn Junes, MD;  Location: [place];  Service: Orthopedics;  Laterality: Right;  partial lateral menisectomoy and chondroplasty   OVARIAN CYST REMOVAL  04-25-10   RIGHT HEART CATH Right 04/28/2021   Procedure: RIGHT HEART CATH;  Surgeon: Wellington Hampshire, MD;  Location: [place] [facility];  Service: Cardiovascular;  Laterality: Right;    Allergies  Allergies  Allergen Reactions   Chocolate     Hives, itching   Chocolate Flavor Other (See Comments)    Hives, itching   Peanut-Containing Drug Products Swelling    cashews   Pineapple Itching and Swelling    History of Present Illness    50 year old female with the above past medical history including chest pain, hypertension, GERD, obesity, anemia,  sleep apnea, chronic pain, and migraines.  She was previous evaluated in 2015 in the setting of chest pain and underwent stress testing, which was low risk.  Diagnostic catheterization was eventually performed in the setting of ongoing symptoms in October 2015, showing normal coronary arteries and normal LV function.  Echocardiogram in 2017 again showed normal LV function without any significant valvular disease.  In June 2022, she again reported dyspnea with minimal activity and intermittent rest and exertional chest pain.  Coronary CTA was performed showing a coronary calcium score of 21.8 (92nd percentile), and minimal (1 to 24%) LAD disease.  The main pulmonary artery was dilated at 28 mm.  Aggressive risk factor modification was recommended.  At follow-up visit July 22, she continued reports significant exertional dyspnea and intermittent chest pain.  She was scheduled for right heart catheterization but initially canceled.  At follow-up visit September 23, she again reported dyspnea on exertion and intermittent pleuritic type chest discomfort.  Right heart catheterization was performed on October 7 showing high normal filling pressures with minimal pulmonary hypertension and normal cardiac output as outlined in the past medical history above.  Since her RHC, she has had ongoing dyspnea with minimal activity.  She also has chronic chest pain that is worse with certain upper body movements.  We discussed her right heart catheterization results in detail today.  She reports that she is in the process of getting new supplies for his CPAP.  She denies palpitations, PND, orthopnea, syncope, or early satiety.  She occasionally notes swelling in her hands.  Home Medications    Current Outpatient Medications  Medication Sig Dispense Refill   acetaminophen (TYLENOL) 500 MG tablet Take 500-1,000 mg by mouth every 6 (six) hours as needed (pain).     aspirin EC 81 MG tablet Take 1 tablet (81 mg total) by mouth  daily. 90 tablet 3   Aspirin-Caffeine (BAYER BACK & BODY PAIN EX ST) 500-32.5 MG TABS Take 1-2 tablets by mouth daily as needed (pain).     carvedilol (COREG) 3.125 MG tablet Take 1 tablet (3.125 mg total) by mouth 2 (two) times daily. 180 tablet 3   diclofenac sodium (VOLTAREN) 1 % GEL Apply 4 g topically 4 (four) times daily. (Patient taking differently: Apply 4 g topically 4 (four) times daily as needed (knee/back pain.).) 300 g 0   gabapentin (NEURONTIN) 300 MG capsule Take 1-2 capsules (300-600 mg total) by mouth at bedtime. (Patient taking differently: Take 300 mg by mouth at bedtime as needed (neuropathy).) 30 capsule 1   omeprazole (PRILOSEC) 40 MG capsule Take 1 capsule (40 mg total) by mouth daily. (Patient taking differently: Take 40 mg by mouth daily as needed (indigestion/heartburn.).) 90 capsule 0   potassium chloride (MICRO-K) 10 MEQ CR capsule Take 1 capsule (10 mEq total) by  mouth daily. (Patient taking differently: Take 10 mEq by mouth 3 (three) times a week.) 90 capsule 0   rosuvastatin (CRESTOR) 5 MG tablet Take 1 tablet (5 mg total) by mouth daily. 90 tablet 3   tiZANidine (ZANAFLEX) 4 MG tablet Take 1 tablet (4 mg total) by mouth daily as needed for muscle spasms. 30 tablet 1   Ubrogepant (UBRELVY) 50 MG TABS Take 1-2 tablets by mouth daily as needed. 10 tablet 2   valsartan-hydrochlorothiazide (DIOVAN HCT) 160-25 MG tablet Take 1 tablet by mouth daily. 90 tablet 3   No current facility-administered medications for this visit.     Review of Systems    Ongoing dyspnea with minimal exertion as well as intermittent chest pain that is worse with certain body positions.  Chest pain can last hours at a time.  She denies palpitations, PND, orthopnea, dizziness, syncope, or early satiety.  She occasionally notes swelling in her hands..  All other systems reviewed and are otherwise negative except as noted above.  Physical Exam    VS:  BP 126/80   Pulse 90   Ht 5\' 2"  (1.575 m)    Wt 222 lb (100.7 kg)   SpO2 97%   BMI 40.60 kg/m  , BMI Body mass index is 40.6 kg/m.     GEN: Obese, in no acute distress. HEENT: normal. Neck: Supple, no JVD, carotid bruits, or masses. Cardiac: RRR, no murmurs, rubs, or gallops. No clubbing, cyanosis, edema.  Radials/PT 2+ and equal bilaterally.  Respiratory:  Respirations regular and unlabored, clear to auscultation bilaterally. GI: Soft, nontender, nondistended, BS + x 4. MS: no deformity or atrophy. Skin: warm and dry, no rash. Neuro:  Strength and sensation are intact. Psych: Normal affect.  Accessory Clinical Findings    ECG personally reviewed by me today -regular sinus rhythm, 90, nonspecific T changes- no acute changes.  Lab Results  Component Value Date   WBC 8.0 04/14/2021   HGB 13.0 04/14/2021   HCT 39.2 04/14/2021   MCV 82 04/14/2021   PLT 311 04/14/2021   Lab Results  Component Value Date   CREATININE 0.74 04/14/2021   BUN 12 04/14/2021   NA 140 04/14/2021   K 3.8 04/14/2021   CL 101 04/14/2021   CO2 25 04/14/2021   Lab Results  Component Value Date   ALT 22 10/29/2019   AST 20 10/29/2019   ALKPHOS 82 10/29/2019   BILITOT 0.5 10/29/2019   Lab Results  Component Value Date   CHOL 162 10/29/2019   HDL 48 10/29/2019   LDLCALC 76 10/29/2019   TRIG 191 (H) 10/29/2019   CHOLHDL 3.4 10/29/2019    Lab Results  Component Value Date   HGBA1C 6.2 (H) 10/29/2019    Assessment & Plan    1.  Dyspnea on exertion/chronic HFpEF: Patient with a long history of dyspnea on exertion as well as rest and exertional chest pain with musculoskeletal component.  This year, she has undergone echocardiogram showing normal LV function with grade 1 diastolic dysfunction, coronary CT angiography showing minimal LAD disease with mildly dilated pulmonary artery, and most recently, right heart catheterization with high normal filling pressures but no significant pulmonary artery hypertension [PA 29/16 (22), PCWP 12, normal  cardiac output/index].  Despite these findings, patient continues to experience dyspnea with minimal activity.  She is euvolemic on exam today.  She plans to follow-up with pulmonology and stated that she will not need a referral, as she works at [employer].  We  discussed that pending pulmonology work-up, we would potentially consider cardiopulmonary exercise testing, and she indicated that this could be performed through her office as well.  2.  Essential hypertension: Blood pressure stable on valsartan-HCTZ and carvedilol.  3.  Obstructive sleep apnea: Patient notes that she is waiting on supplies to arrive for her CPAP machine.  Prior notes indicate the same-she apparently has been waiting many months.  4.  Chest pain: Rest and exertional chest pain is ongoing.  Pain is both pleuritic and musculoskeletal component.  As noted, prior catheterization and subsequent coronary CTA without significant coronary artery disease.  Again, reassurance provided.  5.  Morbid obesity: Deconditioning and obesity is likely playing a role in her dyspnea exertion.  Exercise recommendations on hold pending pulmonology work-up.  6.  Disposition: Patient will follow-up with pulmonology at her discretion.  Follow-up here in 3 months or sooner if necessary.   Murray Hodgkins, NP 05/19/2021, 5:21 PM

## 2021-05-19 NOTE — Patient Instructions (Signed)
Medication Instructions:  No changes at this time.   *If you need a refill on your cardiac medications before your next appointment, please call your pharmacy*   Lab Work: None  If you have labs (blood work) drawn today and your tests are completely normal, you will receive your results only by: [place] (if you have MyChart) OR A paper copy in the mail If you have any lab test that is abnormal or we need to change your treatment, we will call you to review the results.   Testing/Procedures: None   Follow-Up: At [facility], you and your health needs are our priority.  As part of our continuing mission to provide you with exceptional heart care, we have created designated Provider Care Teams.  These Care Teams include your primary Cardiologist (physician) and Advanced Practice Providers (APPs -  Physician Assistants and Nurse Practitioners) who all work together to provide you with the care you need, when you need it.    Your next appointment:   3 month(s)  The format for your next appointment:   In Person  Provider:   You may see Kathlyn Sacramento, MD or one of the following Advanced Practice Providers on your designated Care Team:   Murray Hodgkins, NP Christell Faith, PA-C Marrianne Mood, PA-C Cadence [place], [place]

## 2021-05-23 ENCOUNTER — Other Ambulatory Visit: Payer: Self-pay | Admitting: Family Medicine

## 2021-05-23 DIAGNOSIS — G4733 Obstructive sleep apnea (adult) (pediatric): Secondary | ICD-10-CM

## 2021-05-23 DIAGNOSIS — R0602 Shortness of breath: Secondary | ICD-10-CM

## 2021-05-23 NOTE — Telephone Encounter (Signed)
Copied from [place] [phone] [phone]. Topic: General - Other >> May 22, 2021  2:46 PM Tessa Lerner A wrote: Reason for CRM: Zigmund Daniel with [facility] has called to request a referral for the patient to have a Pulmonary Function Test   Dr. Raul Del would like to request sent to [facility] via fax [phone] or through [place]

## 2021-05-23 NOTE — Telephone Encounter (Signed)
So this was sent wrong, called pt she states she saw Dr Fletcher Anon and he is going to send you his note, but he recommends her doing a PFT and referral will need to be placed by you to see Dr. Vella Kohler for PFT?

## 2021-07-13 ENCOUNTER — Other Ambulatory Visit: Payer: Self-pay | Admitting: Family Medicine

## 2021-07-13 DIAGNOSIS — I10 Essential (primary) hypertension: Secondary | ICD-10-CM

## 2021-08-18 ENCOUNTER — Other Ambulatory Visit: Payer: Self-pay | Admitting: Family Medicine

## 2021-08-18 DIAGNOSIS — Z1231 Encounter for screening mammogram for malignant neoplasm of breast: Secondary | ICD-10-CM

## 2021-08-25 ENCOUNTER — Ambulatory Visit: Payer: Managed Care, Other (non HMO) | Admitting: Cardiovascular Disease

## 2022-04-09 ENCOUNTER — Ambulatory Visit
Admission: RE | Admit: 2022-04-09 | Discharge: 2022-04-09 | Disposition: A | Discharge status: Home / Self Care | Payer: Managed Care, Other (non HMO) | Source: Ambulatory Visit | Attending: Family Medicine | Admitting: Family Medicine

## 2022-04-09 ENCOUNTER — Other Ambulatory Visit: Payer: Self-pay | Admitting: Family Medicine

## 2022-04-09 DIAGNOSIS — R109 Unspecified abdominal pain: Secondary | ICD-10-CM

## 2022-04-09 DIAGNOSIS — R1032 Left lower quadrant pain: Secondary | ICD-10-CM | POA: Insufficient documentation

## 2022-04-09 DIAGNOSIS — R319 Hematuria, unspecified: Secondary | ICD-10-CM | POA: Insufficient documentation

## 2022-04-09 DIAGNOSIS — R10A2 Flank pain, left side: Secondary | ICD-10-CM

## 2022-04-11 ENCOUNTER — Telehealth: Payer: Self-pay | Admitting: Cardiovascular Disease

## 2022-04-11 NOTE — Telephone Encounter (Signed)
Patient stated she was calling to get appointment. Inquired about her reports of chest pain. She states it was hurting on her side and radiating to her heart. She then stated that it comes and goes. Reports of pain in her back and front that are not always associated with her side.  Instructed her to go to ED for evaluation and workup. She adamantly refused and wanted to know if we could draw labs and/or xray. I again advised that ED would be best because they can take labs and see if she is having a active event. She again persisted that she would not go to ED. Reviewed signs and symptoms that she should go to ED and reasons for that. She verbalized understanding but does not want to go to ED. Reviewed strict ED precautions and scheduled appointment.

## 2022-04-11 NOTE — Telephone Encounter (Signed)
Pt c/o of Chest Pain: STAT if CP now or developed within 24 hours  1. Are you having CP right now? yes  2. Are you experiencing any other symptoms (ex. SOB, nausea, vomiting, sweating)? Sweating, but does get hot flashes, SOB but says she is not sure if she is doing it herself from deep breaths  3. How long have you been experiencing CP? A little over a week, but it has gotten worse  4. Is your CP continuous or coming and going? Comes and goes  5. Have you taken Nitroglycerin? no ?  Patient states she has been having chest pain and it has been getting worse. She says she has had a CT scan and an x ray done. She says she also was not sure if it was gas, because she has also had burping.

## 2022-04-17 ENCOUNTER — Encounter: Payer: Self-pay | Admitting: Nurse Practitioner

## 2022-04-17 ENCOUNTER — Ambulatory Visit: Payer: Managed Care, Other (non HMO) | Attending: Nurse Practitioner | Admitting: Nurse Practitioner

## 2022-04-17 VITALS — BP 110/80 | HR 99 | Ht 62.0 in | Wt 206.0 lb

## 2022-04-17 DIAGNOSIS — R079 Chest pain, unspecified: Secondary | ICD-10-CM

## 2022-04-17 DIAGNOSIS — I10 Essential (primary) hypertension: Secondary | ICD-10-CM

## 2022-04-17 DIAGNOSIS — I5032 Chronic diastolic (congestive) heart failure: Secondary | ICD-10-CM | POA: Diagnosis not present

## 2022-04-17 DIAGNOSIS — I2510 Atherosclerotic heart disease of native coronary artery without angina pectoris: Secondary | ICD-10-CM

## 2022-04-17 DIAGNOSIS — R1013 Epigastric pain: Secondary | ICD-10-CM | POA: Diagnosis not present

## 2022-04-17 NOTE — Patient Instructions (Signed)
Medication Instructions:  Your physician recommends that you continue on your current medications as directed. Please refer to the Current Medication list given to you today.  *If you need a refill on your cardiac medications before your next appointment, please call your pharmacy*   Lab Work: None  If you have labs (blood work) drawn today and your tests are completely normal, you will receive your results only by: [place] (if you have MyChart) OR A paper copy in the mail If you have any lab test that is abnormal or we need to change your treatment, we will call you to review the results.   Testing/Procedures: None   Follow-Up: At [facility], you and your health needs are our priority.  As part of our continuing mission to provide you with exceptional heart care, we have created designated Provider Care Teams.  These Care Teams include your primary Cardiologist (physician) and Advanced Practice Providers (APPs -  Physician Assistants and Nurse Practitioners) who all work together to provide you with the care you need, when you need it.   Your next appointment:   6 month(s)  The format for your next appointment:   In Person  Provider:   Kathlyn Sacramento, MD        Important Information About Sugar

## 2022-04-17 NOTE — Progress Notes (Signed)
Office Visit    Patient Name: Marie Vaughan Date of Encounter: 04/17/2022  Primary Care Provider:  Steele Sizer, MD Primary Cardiologist:  Kathlyn Sacramento, MD  Chief Complaint    51 year old female with history of chest pain and normal coronary arteries, hypertension, GERD, sleep apnea, diastolic dysfunction, and obesity, presents for follow-up related to chest pain.  Past Medical History    Past Medical History:  Diagnosis Date   Acne    Acute bronchitis    Allergy    Angio-edema    Anxiety    Anxiety state, unspecified    Cervicalgia    Chronic dyspnea    a. 04/2021 RHC: RA 12/9, RV 31/7 (13), PA 29/16 (22), PCWP 12, CO 5.16 L/min; CI 2.58 L/min/m.   Contact lens/glasses fitting    wears contacts or glasses   Diastolic dysfunction    a. 11/2015 Echo: EF 55-60%, no rwma; b. 01/2021 Echo: EF 55-60%, no rwma, GrI DD, nl RV size/fxn.   Dysmenorrhea    GERD (gastroesophageal reflux disease)    History of stress test    a. Lexiscan 08/25/13: no sig ischemia, attenuation artifact noted, no sig WMA noted, EF 69%, no EKG changes concerning for ischemia   HTN (hypertension)    Iron deficiency anemia secondary to blood loss (chronic)    Migraine with aura, without mention of intractable migraine without mention of status migrainosus    "vestibular" per pt   Non-obstructive CAD (coronary artery disease)    a. 04/29/2014: LM nl, LAD no dz, D1 no dz, D2 very small vessel, D3 very small vessel, LCx nl, OM1 very small vessel, OM2 medium sized vessel, OM 3 no dz, RCA no dz, PDA no dzs; b. 12/2020 Cor CTA: Cor CA2+ = 21.8 (92nd%'ile), LM nl, LAD 1-24%p, D1 nl, LCX nl, RCA nl. Ao nl size w/o Ca2+, main PA dilated @ 79m (? PAH).   Obesity    Other disorders of bone and cartilage(733.99)    Pain in thoracic spine    Pharyngitis    Tear of medial meniscus of left knee    Tear of medial meniscus of right knee    Vaginal delivery    x 3   Wears contact lenses    Past Surgical History:   Procedure Laterality Date   ABDOMINAL HYSTERECTOMY  04-25-10   Due to abnormal PAP   CARDIAC CATHETERIZATION  04/2014   [facility]   COLONOSCOPY WITH PROPOFOL N/A 07/11/2017   Procedure: COLONOSCOPY WITH PROPOFOL;  Surgeon: WLucilla Lame MD;  [place]  Service: Endoscopy;  Laterality: N/A;   DILATION AND CURETTAGE OF UTERUS     ESOPHAGOGASTRODUODENOSCOPY (EGD) WITH PROPOFOL N/A 07/11/2017   Procedure: ESOPHAGOGASTRODUODENOSCOPY (EGD) WITH PROPOFOL;  Surgeon: WLucilla Lame MD;  [place]  Service: Endoscopy;  Laterality: N/A;  diabetic - diet controlled   GIVENS CAPSULE STUDY N/A 09/23/2017   Procedure: GIVENS CAPSULE STUDY;  Surgeon: TVirgel Manifold MD;  Location: [facility];  Service: Endoscopy;  Laterality: N/A;   KNEE ARTHROSCOPY Left 06/10/2013   Procedure: LEFT KNEE ARTHROSCOPY KNEE WITH PARTIAL MEDIAL MENISCECTOMY ;  Surgeon: RLorn Junes MD;  [place]  Service: Orthopedics;  Laterality: Left;  xerofoam, 4x4's, webril, ace wrap, ice wrap   KNEE ARTHROSCOPY WITH MEDIAL MENISECTOMY Right 06/10/2013   Procedure: RIGHT KNEE ARTHROSCOPY WITH MEDIAL MENISECTOMY;  Surgeon: RLorn Junes MD;  [place]  Service: Orthopedics;  Laterality: Right;  partial lateral menisectomoy and chondroplasty   OVARIAN CYST REMOVAL  04-25-10   RIGHT HEART CATH Right 04/28/2021   Procedure: RIGHT HEART CATH;  Surgeon: Wellington Hampshire, MD;  Location[place] [facility];  Service: Cardiovascular;  Laterality: Right;    Allergies  Allergies  Allergen Reactions   Chocolate     Hives, itching   Chocolate Flavor Other (See Comments)    Hives, itching   Peanut-Containing Drug Products Swelling    cashews   Pineapple Itching and Swelling    History of Present Illness    51 year old female with above past medical history including chest pain, hypertension, GERD, obesity, anemia, sleep apnea, chronic  pain, and migraines.  She was previously evaluated in 2015 in the setting of chest pain and underwent stress testing, which was low risk.  Diagnostic catheterization was eventually performed in the setting of ongoing symptoms in October 2015, showing normal coronary arteries and normal LV function.  Echocardiogram in 2017 again showed normal LV function without any significant valve disease.  In June 2022, she again reported dyspnea with minimal activity and intermittent rest and exertional chest pain.  Coronary CTA was performed showing a coronary calcium score of 21.8 (92nd percentile), and minimal (1 to 24%) LAD disease.  The main pulmonary artery was dilated to 28 mm.  Aggressive risk factor modification was recommended.  A follow-up visit in July 2022, she continued to report significant exertional dyspnea and intermittent chest pain.  She was scheduled for right and left heart cardiac catheterization but initially canceled.  At follow-up visit in September 2022, she again reported dyspnea on exertion and intermittent pleuritic type chest discomfort.  Right heart catheterization was performed on October 7, showing high normal filling pressures with minimal pulmonary hypertension and normal cardiac output.  Ms. Iannelli was last seen in cardiology clinic in October 2022 at which time she reported ongoing chest pain worse with certain upper body movements.  At that time, she was in the process of getting new CPAP supplies and was pending pulmonology evaluation through [facility].  She was seen by pulmonology in December 2022 at which time she reported 14 pound weight loss on phentermine, with improved breathing.  She contacted our office on September 20 with complaints of chest pain.  She refused to go to the ED and was scheduled for today.  The discomfort started a few days prior to her call to our office.  She was seen by primary care and was noted to have reproducible left flank and abdominal discomfort.   CT of the abdomen and pelvis was unremarkable.  She was prescribed Mobic but has not picked it up yet.  She has continued to have intermittent but frequent left upper abdominal and flank discomfort that moves to the epigastric area and is worse with palpation and certain upper body movements.  When she is experiencing discomfort, she cannot lie on that side due to worsening.  Symptoms sometimes move into the lower chest and are associated with dysphagia.  She will eventually drink a carbonated beverage and belch with some improvement.  She has not had any dyspnea and denies palpitations, PND, orthopnea, dizziness, syncope, edema, or early satiety.  Home Medications    Current Outpatient Medications  Medication Sig Dispense Refill   acetaminophen (TYLENOL) 500 MG tablet Take 500-1,000 mg by mouth every 6 (six) hours as needed (pain).     aspirin EC 81 MG tablet Take 1 tablet (81 mg total) by mouth daily. [place]  tablet 3   Aspirin-Caffeine (BAYER BACK & BODY PAIN EX ST) 500-32.5 MG TABS Take 1-2 tablets by mouth daily as needed (pain).     carvedilol (COREG) 3.125 MG tablet Take 1 tablet (3.125 mg total) by mouth 2 (two) times daily. 180 tablet 3   diclofenac sodium (VOLTAREN) 1 % GEL Apply 4 g topically 4 (four) times daily. (Patient taking differently: Apply 4 g topically 4 (four) times daily as needed (knee/back pain.).) 300 g 0   gabapentin (NEURONTIN) 300 MG capsule Take 1-2 capsules (300-600 mg total) by mouth at bedtime. (Patient taking differently: Take 300 mg by mouth at bedtime as needed (neuropathy).) 30 capsule 1   omeprazole (PRILOSEC) 40 MG capsule Take 40 mg by mouth daily as needed.     potassium chloride (MICRO-K) 10 MEQ CR capsule Take 1 capsule (10 mEq total) by mouth daily. (Patient taking differently: Take 10 mEq by mouth 3 (three) times a week.) 90 capsule 0   rosuvastatin (CRESTOR) 5 MG tablet Take 1 tablet (5 mg total) by mouth daily. 90 tablet 3   Ubrogepant (UBRELVY) 50 MG TABS  Take 1-2 tablets by mouth daily as needed. 10 tablet 2   valsartan-hydrochlorothiazide (DIOVAN HCT) 160-25 MG tablet Take 1 tablet by mouth daily. 90 tablet 3   No current facility-administered medications for this visit.     Review of Systems    Left-sided abdominal and epigastric discomfort sometimes associate with dysphagia as outlined above.  She denies angina, dyspnea, palpitations, PND, orthopnea, dizziness, syncope, edema, or early satiety.  All other systems reviewed and are otherwise negative except as noted above.    Physical Exam    VS:  BP 110/80 (BP Location: Left Arm, Patient Position: Sitting, Cuff Size: Large)   Pulse 99   Ht '5\' 2"'$  (1.575 m)   Wt 206 lb (93.4 kg)   SpO2 98%   BMI 37.68 kg/m  , BMI Body mass index is 37.68 kg/m.     GEN: Well nourished, well developed, in no acute distress. HEENT: normal. Neck: Supple, no JVD, carotid bruits, or masses. Cardiac: RRR, no murmurs, rubs, or gallops. No clubbing, cyanosis, edema.  Radials/PT 2+ and equal bilaterally.  Respiratory:  Respirations regular and unlabored, clear to auscultation bilaterally. GI: Soft, epigastric tenderness noted with light palpation.  Nondistended.  Bowel sounds present x4.  MS: no deformity or atrophy. Skin: warm and dry, no rash. Neuro:  Strength and sensation are intact. Psych: Normal affect.  Accessory Clinical Findings    ECG personally reviewed by me today -regular sinus rhythm, 99, nonspecific T changes- no acute changes.  Lab Results  Component Value Date   WBC 8.0 04/14/2021   HGB 13.0 04/14/2021   HCT 39.2 04/14/2021   MCV 82 04/14/2021   PLT 311 04/14/2021   Lab Results  Component Value Date   CREATININE 0.74 04/14/2021   BUN 12 04/14/2021   NA 140 04/14/2021   K 3.8 04/14/2021   CL 101 04/14/2021   CO2 25 04/14/2021   Lab Results  Component Value Date   ALT 22 10/29/2019   AST 20 10/29/2019   ALKPHOS 82 10/29/2019   BILITOT 0.5 10/29/2019   Lab Results   Component Value Date   CHOL 162 10/29/2019   HDL 48 10/29/2019   LDLCALC 76 10/29/2019   TRIG 191 (H) 10/29/2019   CHOLHDL 3.4 10/29/2019    Lab Results  Component Value Date   HGBA1C 6.2 (H) 10/29/2019    Assessment &  Plan    1.  Abdominal and epigastric pain and tenderness: Over the past 10 days or so, patient has been having left flank, abdominal, and epigastric discomfort associated with dysphagia.  Pain is worse with palpation and easily reproducible on examination today with light palpation over the epigastric area.  She was previous seen by primary care and underwent CT of the abdomen and pelvis which was unremarkable.  She was prescribed Mobic for presumed musculoskeletal symptoms but has not started this yet.  She was concerned that symptoms might be cardiac in origin though she has not had any true chest discomfort/angina or dyspnea..  In discussing symptoms today, they seem to be worse if she eats dairy products.  She has never tried Lactaid and I encouraged her to try this.  She is on a PPI and has been taking twice daily periodically.  I suspect she will ultimately require GI work-up.  She previously had a appointment scheduled with Jefm Bryant but had to reschedule due to a diarrheal process earlier this month.  2.  History of chest pain/nonobstructive coronary disease: Long history of chest pain and dyspnea with multiple evaluations including nonobstructive disease on catheterization in October 2015 and more recently, coronary CT angiogram in June 2022 which showed minimal 1 to 24% LAD plaque with otherwise normal coronary arteries.  Previous right heart catheterization showed only mildly elevated filling pressures.  See discussion above regarding chest/epigastric discomfort.  Breathing improved most with significant weight loss following phentermine therapy, which she is no longer taking.  She denies any significant dyspnea today and is euvolemic on examination.  Continue beta-blocker,  aspirin, statin, and valsartan-HCTZ.  3.  Chronic HFpEF/dyspnea on exertion: As above, dyspnea improved with weight loss.  Mildly elevated filling pressures on right heart catheterization last year.  Euvolemic on examination.  Continue valsartan-HCTZ and beta-blocker therapy.  4.  Obstructive sleep apnea: On CPAP.  Followed by pulmonology.  5.  Morbid obesity: Patient lost a fair amount of weight on phentermine therapy which she is no longer taking.  Weight currently trending between about 201 and 206 pounds.  She hopes to lose more weight.  6.  Essential hypertension: Stable.  Continue current regimen.  7.  Disposition: Patient to follow-up with primary care and GI related to abdominal/epigastric symptoms.  Follow-up with cardiology in 6 months or sooner if necessary.  Murray Hodgkins, NP 04/17/2022, 11:41 AM

## 2022-04-21 ENCOUNTER — Other Ambulatory Visit: Payer: Self-pay | Admitting: Nurse Practitioner

## 2022-07-14 ENCOUNTER — Emergency Department
Admission: EM | Admit: 2022-07-14 | Discharge: 2022-07-14 | Disposition: A | Discharge status: Home / Self Care | Payer: Managed Care, Other (non HMO) | Attending: Emergency Medicine | Admitting: Emergency Medicine

## 2022-07-14 ENCOUNTER — Other Ambulatory Visit: Payer: Self-pay

## 2022-07-14 DIAGNOSIS — K0520 Aggressive periodontitis, unspecified: Secondary | ICD-10-CM | POA: Insufficient documentation

## 2022-07-14 DIAGNOSIS — K0889 Other specified disorders of teeth and supporting structures: Secondary | ICD-10-CM | POA: Diagnosis present

## 2022-07-14 DIAGNOSIS — K047 Periapical abscess without sinus: Secondary | ICD-10-CM

## 2022-07-14 MED ORDER — LIDOCAINE VISCOUS HCL 2 % MT SOLN
15.0000 mL | Freq: Once | OROMUCOSAL | Status: AC
  Administered 2022-07-14: 15 mL via OROMUCOSAL
  Filled 2022-07-14: qty 15

## 2022-07-14 MED ORDER — AMOXICILLIN-POT CLAVULANATE 875-125 MG PO TABS
1.0000 | ORAL_TABLET | Freq: Once | ORAL | Status: AC
  Administered 2022-07-14: 1 via ORAL
  Filled 2022-07-14: qty 1

## 2022-07-14 MED ORDER — AMOXICILLIN-POT CLAVULANATE 875-125 MG PO TABS: 1.0000 | ORAL_TABLET | Freq: Two times a day (BID) | ORAL | 0 refills | Status: AC

## 2022-07-14 NOTE — Discharge Instructions (Signed)
Antibiotic as directed.  Rest warm salt water after every meal.  Use a soft toothbrush to gently cleanse around the wisdom teeth.

## 2022-07-14 NOTE — ED Provider Notes (Signed)
[facility] Emergency Department Provider Note     Event Date/Time   First MD Initiated Contact with Patient 07/14/22 1206     (approximate)   History   Dental Pain   HPI  Marie Vaughan is a 51 y.o. female presents to the ED for evaluation of acute pain to a left lower wisdom tooth.  She is under the care of of a dental surgeon who is prepared to remove the wisdom tooth in the new year.  Patient reports some acute pain and swelling to the lower jaw.  She denies any fevers, chills, or sweats.  She denies any difficulty breathing, swallowing, or controlling oral secretions.     Physical Exam   Triage Vital Signs: ED Triage Vitals [07/14/22 1154]  Enc Vitals Group     BP (!) 154/93     Pulse Rate 95     Resp 18     Temp 98.2 F (36.8 C)     Temp Source Oral     SpO2 95 %     Weight      Height      Head Circumference      Peak Flow      Pain Score 10     Pain Loc      Pain Edu?      Excl. in [place]?     Most recent vital signs: Vitals:   07/14/22 1154  BP: (!) 154/93  Pulse: 95  Resp: 18  Temp: 98.2 F (36.8 C)  SpO2: 95%    General Awake, no distress. NAD HEENT NCAT. PERRL. EOMI. No rhinorrhea. Mucous membranes are moist.  Uvula is midline and tonsils are flat.  No oropharyngeal lesions are appreciated.  Patient with some evidence of pericoronitis to the left lower third molar.  No brawny sublingual edema is appreciated.  No purulence or drainage is noted. CV:  Good peripheral perfusion.  RESP:  Normal effort.  ABD:  No distention.     ED Results / Procedures / Treatments   Labs (all labs ordered are listed, but only abnormal results are displayed) Labs Reviewed - No data to display   EKG    RADIOLOGY  No results found.   PROCEDURES:  Critical Care performed: No  Procedures   MEDICATIONS ORDERED IN ED: Medications  amoxicillin-clavulanate (AUGMENTIN) 875-125 MG per tablet 1 tablet (has no administration in time  range)  lidocaine (XYLOCAINE) 2 % viscous mouth solution 15 mL (has no administration in time range)     IMPRESSION / MDM / ASSESSMENT AND PLAN / ED COURSE  I reviewed the triage vital signs and the nursing notes.                              Differential diagnosis includes, but is not limited to, dental abscess, dental infection, pericoronitis, unerupted wisdom tooth,  Patient's presentation is most consistent with acute, uncomplicated illness.  Patient to the ED for evaluation of acute left lower jaw pain secondary to pericarditis to the left lower wisdom tooth.  Patient presents in no acute distress and is afebrile.  She does endorse some acute dental pain.  Patient's diagnosis is consistent with pericoronitis. Patient will be discharged home with prescriptions for amoxicillin. Patient is to follow up with dental surgeon as planned as needed or otherwise directed. Patient is given ED precautions to return to the ED for any worsening or new symptoms.  FINAL CLINICAL IMPRESSION(S) / ED DIAGNOSES   Final diagnoses:  Acute pericoronitis  Dental infection     Rx / DC Orders   ED Discharge Orders          Ordered    amoxicillin-clavulanate (AUGMENTIN) 875-125 MG tablet  2 times daily        07/14/22 1253             Note:  This document was prepared using Dragon voice recognition software and may include unintentional dictation errors.    Melvenia Needles, PA-C 07/18/22 0005    Blake Divine, MD 07/18/22 2014

## 2022-07-14 NOTE — ED Triage Notes (Signed)
Pt via POV from home. Pt has a wisdom tooth that needs to be removed. Pt c/o pain and swelling. Pt is A&Ox4 and NAD

## 2022-10-05 ENCOUNTER — Ambulatory Visit: Payer: Managed Care, Other (non HMO) | Admitting: Cardiovascular Disease

## 2022-10-05 NOTE — Progress Notes (Deleted)
Cardiology Office Note   Date:  10/05/2022   ID:  Marie Vaughan, DOB [birth year], MRN [id number]  PCP:  Steele Sizer, MD  Cardiologist:   Kathlyn Sacramento, MD   No chief complaint on file.     History of Present Illness: Marie Vaughan is a 52 y.o. female who presents for a follow-up visit regarding chest pain. She has history of normal coronary arteries on cardiac catheterization 2015, essential hypertension, GERD, obstructive sleep apnea not on CPAP and obesity. She was seen again recently in our office by Ignacia Bayley for atypical chest pain and exertional dyspnea.  Her EKG was normal.  She underwent cardiac CTA which showed mild nonobstructive coronary artery disease with calcium score of 22.  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  Echocardiogram was done which showed an EF of 55 to 60% with grade 1 diastolic dysfunction and no significant valvular abnormalities.  Pulmonary pressure could not be estimated. The patient continues to have significant exertional dyspnea and intermittent chest pain.  Some of her chest pain seems to be pleuritic as it gets worse with taking a breath and.  She is very frustrated with her symptoms in spite of reassuring work-up so far. She does not use CPAP on a regular basis.   Past Medical History:  Diagnosis Date   Acne    Acute bronchitis    Allergy    Angio-edema    Anxiety    Anxiety state, unspecified    Cervicalgia    Chronic dyspnea    a. 04/2021 RHC: RA 12/9, RV 31/7 (13), PA 29/16 (22), PCWP 12, CO 5.16 L/min; CI 2.58 L/min/m.   Contact lens/glasses fitting    wears contacts or glasses   Diastolic dysfunction    a. 11/2015 Echo: EF 55-60%, no rwma; b. 01/2021 Echo: EF 55-60%, no rwma, GrI DD, nl RV size/fxn.   Dysmenorrhea    GERD (gastroesophageal reflux disease)    History of stress test    a. Lexiscan 08/25/13: no sig ischemia, attenuation artifact noted, no sig WMA noted, EF 69%, no EKG changes concerning for ischemia   HTN  (hypertension)    Iron deficiency anemia secondary to blood loss (chronic)    Migraine with aura, without mention of intractable migraine without mention of status migrainosus    "vestibular" per pt   Non-obstructive CAD (coronary artery disease)    a. 04/29/2014: LM nl, LAD no dz, D1 no dz, D2 very small vessel, D3 very small vessel, LCx nl, OM1 very small vessel, OM2 medium sized vessel, OM 3 no dz, RCA no dz, PDA no dzs; b. 12/2020 Cor CTA: Cor CA2+ = 21.8 (92nd%'ile), LM nl, LAD 1-24%p, D1 nl, LCX nl, RCA nl. Ao nl size w/o Ca2+, main PA dilated @ 79mm (? PAH).   Obesity    Other disorders of bone and cartilage(733.99)    Pain in thoracic spine    Pharyngitis    Tear of medial meniscus of left knee    Tear of medial meniscus of right knee    Vaginal delivery    x 3   Wears contact lenses     Past Surgical History:  Procedure Laterality Date   ABDOMINAL HYSTERECTOMY  04-25-10   Due to abnormal PAP   CARDIAC CATHETERIZATION  04/2014   [facility]   COLONOSCOPY WITH PROPOFOL N/A 07/11/2017   Procedure: COLONOSCOPY WITH PROPOFOL;  Surgeon: [name], MD;  Location: [place];  Service: Endoscopy;  Laterality: N/A;   DILATION AND CURETTAGE OF UTERUS     ESOPHAGOGASTRODUODENOSCOPY (EGD) WITH PROPOFOL N/A 07/11/2017   Procedure: ESOPHAGOGASTRODUODENOSCOPY (EGD) WITH PROPOFOL;  Surgeon: [name], MD;  Location: [place];  Service: Endoscopy;  Laterality: N/A;  diabetic - diet controlled   GIVENS CAPSULE STUDY N/A 09/23/2017   Procedure: GIVENS CAPSULE STUDY;  Surgeon: Virgel Manifold, MD;  Location: [facility];  Service: Endoscopy;  Laterality: N/A;   KNEE ARTHROSCOPY Left 06/10/2013   Procedure: LEFT KNEE ARTHROSCOPY KNEE WITH PARTIAL MEDIAL MENISCECTOMY ;  Surgeon: Lorn Junes, MD;  Location: [place];  Service: Orthopedics;  Laterality: Left;  xerofoam, 4x4's, webril, ace wrap, ice wrap   KNEE ARTHROSCOPY WITH MEDIAL MENISECTOMY Right  06/10/2013   Procedure: RIGHT KNEE ARTHROSCOPY WITH MEDIAL MENISECTOMY;  Surgeon: Lorn Junes, MD;  Location: [place];  Service: Orthopedics;  Laterality: Right;  partial lateral menisectomoy and chondroplasty   OVARIAN CYST REMOVAL  04-25-10   RIGHT HEART CATH Right 04/28/2021   Procedure: RIGHT HEART CATH;  Surgeon: Wellington Hampshire, MD;  Location: [place] [facility];  Service: Cardiovascular;  Laterality: Right;     Current Outpatient Medications  Medication Sig Dispense Refill   acetaminophen (TYLENOL) 500 MG tablet Take 500-1,000 mg by mouth every 6 (six) hours as needed (pain).     aspirin EC 81 MG tablet Take 1 tablet (81 mg total) by mouth daily. 90 tablet 3   Aspirin-Caffeine (BAYER BACK & BODY PAIN EX ST) 500-32.5 MG TABS Take 1-2 tablets by mouth daily as needed (pain).     carvedilol (COREG) 3.125 MG tablet Take 1 tablet (3.125 mg total) by mouth 2 (two) times daily. 180 tablet 3   diclofenac sodium (VOLTAREN) 1 % GEL Apply 4 g topically 4 (four) times daily. (Patient taking differently: Apply 4 g topically 4 (four) times daily as needed (knee/back pain.).) 300 g 0   gabapentin (NEURONTIN) 300 MG capsule Take 1-2 capsules (300-600 mg total) by mouth at bedtime. (Patient taking differently: Take 300 mg by mouth at bedtime as needed (neuropathy).) 30 capsule 1   omeprazole (PRILOSEC) 40 MG capsule Take 40 mg by mouth daily as needed.     potassium chloride (MICRO-K) 10 MEQ CR capsule Take 1 capsule (10 mEq total) by mouth daily. (Patient taking differently: Take 10 mEq by mouth 3 (three) times a week.) 90 capsule 0   rosuvastatin (CRESTOR) 5 MG tablet Take 1 tablet (5 mg total) by mouth daily. 90 tablet 3   Ubrogepant (UBRELVY) 50 MG TABS Take 1-2 tablets by mouth daily as needed. 10 tablet 2   valsartan-hydrochlorothiazide (DIOVAN-HCT) 160-25 MG tablet TAKE 1 TABLET BY MOUTH DAILY 90 tablet 2   No current facility-administered medications for this visit.     Allergies:   Chocolate, Chocolate flavor, Peanut-containing drug products, and Pineapple    Social History:  The patient  reports that she has never smoked. She has never used smokeless tobacco. She reports that she does not drink alcohol and does not use drugs.   Family History:  The patient's family history includes Alcohol abuse in her maternal uncle and another family member; Arthritis in an other family member; Diabetes in her maternal aunt and mother; Early death in her mother; Early death (age of onset: 28) in her sister; Gout in her mother; Heart disease in an other family member; Hyperlipidemia in her maternal aunt; Hypertension in her maternal aunt and mother; Kidney failure  in her mother; Other in an other family member; Prostate cancer in her maternal uncle.    ROS:  Please see the history of present illness.   Otherwise, review of systems are positive for none.   All other systems are reviewed and negative.    PHYSICAL EXAM: VS:  There were no vitals taken for this visit. , BMI There is no height or weight on file to calculate BMI. GEN: Well nourished, well developed, in no acute distress  HEENT: normal  Neck: no JVD, carotid bruits, or masses Cardiac: RRR; no murmurs, rubs, or gallops,no edema  Respiratory:  clear to auscultation bilaterally, normal work of breathing GI: soft, nontender, nondistended, + BS MS: no deformity or atrophy  Skin: warm and dry, no rash Neuro:  Strength and sensation are intact Psych: euthymic mood, full affect   EKG:  EKG is not ordered today.    Recent Labs: No results found for requested labs within last 365 days.    Lipid Panel    Component Value Date/Time   CHOL 162 10/29/2019 1351   TRIG 191 (H) 10/29/2019 1351   HDL 48 10/29/2019 1351   CHOLHDL 3.4 10/29/2019 1351   VLDL 38 10/29/2019 1351   LDLCALC 76 10/29/2019 1351      Wt Readings from Last 3 Encounters:  04/17/22 206 lb (93.4 kg)  05/19/21 222 lb (100.7 kg)   04/28/21 221 lb 3.2 oz (100.3 kg)           No data to display            ASSESSMENT AND PLAN:  1.  Atypical chest pain and exertional dyspnea: Her chest pain is overall atypical with some pleuritic component.  Nonetheless, she clearly has a component of exertional chest pain and has dyspnea with minimal exertion which has been very frustrating to her.  She has been trying to increase her physical activities but has not been able to do so.  Cardiac CTA showed no evidence of obstructive coronary artery disease.  However, the pulmonary artery was mildly dilated.  In addition, there was some degree of diastolic dysfunction on echocardiogram and pulmonary pressure could not be estimated.  Also she has untreated sleep apnea which puts her at high risk for pulmonary hypertension.  Based on all of this, I do think we have to exclude significant pulmonary hypertension and also significant heart failure contributing to her symptoms.  Thus, I recommend evaluation with a right heart catheterization.  I discussed the procedure in details as well as risks and benefits.  2.  Obstructive sleep apnea: She has not been using her CPAP and its an old machine.  She might need new referral to get this treated.  3.  Essential hypertension: The dose of Diovan hydrochlorothiazide was increased during the last visit.    Disposition:   FU with me in 1 month  Signed,  Kathlyn Sacramento, MD  10/05/2022 9:14 AM    [place]

## 2022-11-09 ENCOUNTER — Encounter: Payer: Self-pay | Admitting: Nurse Practitioner

## 2022-11-09 ENCOUNTER — Ambulatory Visit: Payer: Managed Care, Other (non HMO) | Attending: Nurse Practitioner | Admitting: Nurse Practitioner

## 2022-11-09 VITALS — BP 128/88 | HR 98 | Ht 62.0 in | Wt 219.0 lb

## 2022-11-09 DIAGNOSIS — I5032 Chronic diastolic (congestive) heart failure: Secondary | ICD-10-CM | POA: Diagnosis not present

## 2022-11-09 DIAGNOSIS — R072 Precordial pain: Secondary | ICD-10-CM

## 2022-11-09 DIAGNOSIS — E782 Mixed hyperlipidemia: Secondary | ICD-10-CM

## 2022-11-09 DIAGNOSIS — I10 Essential (primary) hypertension: Secondary | ICD-10-CM

## 2022-11-09 DIAGNOSIS — G4733 Obstructive sleep apnea (adult) (pediatric): Secondary | ICD-10-CM | POA: Diagnosis not present

## 2022-11-09 DIAGNOSIS — I2510 Atherosclerotic heart disease of native coronary artery without angina pectoris: Secondary | ICD-10-CM

## 2022-11-09 MED ORDER — CARVEDILOL 3.125 MG PO TABS: 3.1250 mg | ORAL_TABLET | Freq: Two times a day (BID) | ORAL | 3 refills | Status: DC

## 2022-11-09 MED ORDER — ROSUVASTATIN CALCIUM 5 MG PO TABS: 5.0000 mg | ORAL_TABLET | Freq: Every day | ORAL | 3 refills | Status: AC

## 2022-11-09 NOTE — Patient Instructions (Signed)
Medication Instructions:  No changes at this time.   *If you need a refill on your cardiac medications before your next appointment, please call your pharmacy*   Lab Work: None  If you have labs (blood work) drawn today and your tests are completely normal, you will receive your results only by: MyChart Message (if you have MyChart) OR A paper copy in the mail If you have any lab test that is abnormal or we need to change your treatment, we will call you to review the results.   Testing/Procedures: None   Follow-Up: At [facility], you and your health needs are our priority.  As part of our continuing mission to provide you with exceptional heart care, we have created designated Provider Care Teams.  These Care Teams include your primary Cardiologist (physician) and Advanced Practice Providers (APPs -  Physician Assistants and Nurse Practitioners) who all work together to provide you with the care you need, when you need it.  Your next appointment:   1 year(s)  Provider:   Muhammad Arida, MD        

## 2022-11-09 NOTE — Progress Notes (Signed)
Office Visit    Patient Name: Marie Vaughan Date of Encounter: 11/09/2022  Primary Care Provider:  Alba Cory, MD Primary Cardiologist:  Lorine Bears, MD  Chief Complaint    52 year old female with history of chest pain and normal coronary arteries, hypertension, GERD, sleep apnea, diastolic dysfunction, and obesity who presents for follow-up related to chest pain.  Past Medical History    Past Medical History:  Diagnosis Date   Acne    Acute bronchitis    Allergy    Angio-edema    Anxiety    Anxiety state, unspecified    Cervicalgia    Chronic dyspnea    a. 04/2021 RHC: RA 12/9, RV 31/7 (13), PA 29/16 (22), PCWP 12, CO 5.16 L/min; CI 2.58 L/min/m.   Contact lens/glasses fitting    wears contacts or glasses   Diastolic dysfunction    a. 11/2015 Echo: EF 55-60%, no rwma; b. 01/2021 Echo: EF 55-60%, no rwma, GrI DD, nl RV size/fxn.   Dysmenorrhea    GERD (gastroesophageal reflux disease)    History of stress test    a. Lexiscan 08/25/13: no sig ischemia, attenuation artifact noted, no sig WMA noted, EF 69%, no EKG changes concerning for ischemia   HTN (hypertension)    Iron deficiency anemia secondary to blood loss (chronic)    Migraine with aura, without mention of intractable migraine without mention of status migrainosus    "vestibular" per pt   Non-obstructive CAD (coronary artery disease)    a. 04/29/2014: LM nl, LAD no dz, D1 no dz, D2 very small vessel, D3 very small vessel, LCx nl, OM1 very small vessel, OM2 medium sized vessel, OM 3 no dz, RCA no dz, PDA no dzs; b. 12/2020 Cor CTA: Cor CA2+ = 21.8 (92nd%'ile), LM nl, LAD 1-24%p, D1 nl, LCX nl, RCA nl. Ao nl size w/o Ca2+, main PA dilated @ 28mm (? PAH).   Obesity    Other disorders of bone and cartilage(733.99)    Pain in thoracic spine    Pharyngitis    Tear of medial meniscus of left knee    Tear of medial meniscus of right knee    Vaginal delivery    x 3   Wears contact lenses    Past Surgical  History:  Procedure Laterality Date   ABDOMINAL HYSTERECTOMY  04-25-10   Due to abnormal PAP   CARDIAC CATHETERIZATION  04/2014   [facility]   COLONOSCOPY WITH PROPOFOL N/A 07/11/2017   Procedure: COLONOSCOPY WITH PROPOFOL;  Surgeon: Midge Minium, MD;  Location: [facility];  Service: Endoscopy;  Laterality: N/A;   DILATION AND CURETTAGE OF UTERUS     ESOPHAGOGASTRODUODENOSCOPY (EGD) WITH PROPOFOL N/A 07/11/2017   Procedure: ESOPHAGOGASTRODUODENOSCOPY (EGD) WITH PROPOFOL;  Surgeon: Midge Minium, MD;  Location: [facility];  Service: Endoscopy;  Laterality: N/A;  diabetic - diet controlled   GIVENS CAPSULE STUDY N/A 09/23/2017   Procedure: GIVENS CAPSULE STUDY;  Surgeon: Pasty Spillers, MD;  Location: [facility];  Service: Endoscopy;  Laterality: N/A;   KNEE ARTHROSCOPY Left 06/10/2013   Procedure: LEFT KNEE ARTHROSCOPY KNEE WITH PARTIAL MEDIAL MENISCECTOMY ;  Surgeon: Nilda Simmer, MD;  Location: [facility];  Service: Orthopedics;  Laterality: Left;  xerofoam, 4x4's, webril, ace wrap, ice wrap   KNEE ARTHROSCOPY WITH MEDIAL MENISECTOMY Right 06/10/2013   Procedure: RIGHT KNEE ARTHROSCOPY WITH MEDIAL MENISECTOMY;  Surgeon: Nilda Simmer, MD;  Location: [facility];  Service: Orthopedics;  Laterality:  Right;  partial lateral menisectomoy and chondroplasty   OVARIAN CYST REMOVAL  04-25-10   RIGHT HEART CATH Right 04/28/2021   Procedure: RIGHT HEART CATH;  Surgeon: Iran Ouch, MD;  Location: [facility];  Service: Cardiovascular;  Laterality: Right;    Allergies  Allergies  Allergen Reactions   Chocolate     Hives, itching   Chocolate Flavor Other (See Comments)    Hives, itching   Peanut-Containing Drug Products Swelling    cashews   Pineapple Itching and Swelling    History of Present Illness    52 year old female with above past medical history including chest pain, hypertension, GERD, obesity, anemia, sleep apnea,  chronic pain, and migraines.  She was previously evaluated in 2015 in the setting of chest pain and underwent stress testing, which was low risk. Diagnostic catheterization was eventually performed in the setting of ongoing symptoms in October 2015, showing normal coronary arteries and normal LV function. Echocardiogram in 2017 again showed normal LV function without any significant valve disease. In June 2022, she again reported dyspnea with minimal activity and intermittent rest and exertional chest pain. Coronary CTA was performed showing a coronary calcium score of 21.8 (92nd percentile), and minimal (1 to 24%) LAD disease. The main pulmonary artery was dilated to 28 mm. Aggressive risk factor modification was recommended. A follow-up visit in July 2022, she continued to report significant exertional dyspnea and intermittent chest pain. She was scheduled for right and left heart cardiac catheterization but initially canceled. At follow-up visit in September 2022, she again reported dyspnea on exertion and intermittent pleuritic type chest discomfort. Right heart catheterization was performed on October 7, showing high normal filling pressures with minimal pulmonary hypertension and normal cardiac output.   Marie Vaughan was last in cardiology clinic in September 2023 following an episode of chest pain earlier in the week.  At office visit, she reported ongoing left upper abdominal and flank discomfort that moved to the epigastric area that was worse with palpation and certain upper body movements.  Symptoms were worse with eating dairy products.  ECG was unremarkable and no further ischemic evaluation was warranted.  She subsequently followed up with GI and subsequently underwent EGD and colonoscopy November 2023 - results not visible in care everywhere.  Marie Vaughan was just seen in the emergency department at [facility] [place] on April 8 with complaints of progressive cough, dyspnea, and chest pain.  She also reported an  episode of hemoptysis, lightheadedness, and presyncope.  Workup including chest x-ray, ECG, troponins, D-dimer, BNP and viral swabs, was unremarkable.  She was diagnosed with suspected bronchitis and was prescribed albuterol and steroids.  She has now completed the course of steroids but continues to have a cough.  This is sometimes associated with pleuritic chest pain and occasional sharp and shooting precordial chest pain.  She has ongoing epigastric abdominal discomfort especially after meals but also randomly throughout the day.  She plans to follow-up with GI.  She is chronic, stable dyspnea on exertion.  She notes that she came off of her carvedilol and rosuvastatin in effort to reduce the number of medications she is taking.  We discussed the role of these medications by and managing her precordial chest pain, hypertension, and hyperlipidemia.  She denies palpitations, PND, orthopnea, dizziness, syncope, or early satiety.  She notes occasional ankle edema.  Home Medications    Current Outpatient Medications  Medication Sig Dispense Refill   acetaminophen (TYLENOL) 500 MG tablet Take 500-1,000 mg by  mouth every 6 (six) hours as needed (pain).     albuterol (VENTOLIN HFA) 108 (90 Base) MCG/ACT inhaler Inhale 1-2 puffs into the lungs every 4 (four) hours as needed.     aspirin EC 81 MG tablet Take 1 tablet (81 mg total) by mouth daily. 90 tablet 3   Aspirin-Caffeine (BAYER BACK & BODY PAIN EX ST) 500-32.5 MG TABS Take 1-2 tablets by mouth daily as needed (pain).     diclofenac sodium (VOLTAREN) 1 % GEL Apply 4 g topically 4 (four) times daily. 300 g 0   omeprazole (PRILOSEC) 40 MG capsule Take 40 mg by mouth daily as needed.     potassium chloride (MICRO-K) 10 MEQ CR capsule Take 1 capsule (10 mEq total) by mouth daily. 90 capsule 0   Ubrogepant (UBRELVY) 50 MG TABS Take 1-2 tablets by mouth daily as needed. 10 tablet 2   valsartan-hydrochlorothiazide (DIOVAN-HCT) 160-25 MG tablet TAKE 1 TABLET BY  MOUTH DAILY 90 tablet 2   gabapentin (NEURONTIN) 300 MG capsule Take 1-2 capsules (300-600 mg total) by mouth at bedtime. (Patient not taking: Reported on 11/09/2022) 30 capsule 1   rosuvastatin (CRESTOR) 5 MG tablet Take 1 tablet (5 mg total) by mouth daily. (Patient not taking: Reported on 11/09/2022) 90 tablet 3   No current facility-administered medications for this visit.     Review of Systems    Ongoing cough for several months now not significantly improved following recent prednisone taper.  Chronic dyspnea on exertion.  Occasional lower extremity edema.  Occasional pleuritic chest pain especially worse with coughing as well as sharp and shooting chest pain.  She denies palpitations, PND, orthopnea, dizziness, syncope, or early satiety.  All other systems reviewed and are otherwise negative except as noted above.    Physical Exam    VS:  BP 128/88 (BP Location: Left Arm, Patient Position: Sitting, Cuff Size: Large)   Pulse 98   Ht 5\' 2"  (1.575 m)   Wt 219 lb (99.3 kg)   SpO2 98%   BMI 40.06 kg/m  , BMI Body mass index is 40.06 kg/m.     GEN: Well nourished, well developed, in no acute distress. HEENT: normal. Neck: Supple, no JVD, carotid bruits, or masses. Cardiac: RRR, no murmurs, rubs, or gallops. No clubbing, cyanosis, edema.  Radials 2+/PT 2+ and equal bilaterally.  Respiratory:  Respirations regular and unlabored, clear to auscultation bilaterally. GI: Soft, nontender, nondistended, BS + x 4. MS: no deformity or atrophy. Skin: warm and dry, no rash. Neuro:  Strength and sensation are intact. Psych: Normal affect.  Accessory Clinical Findings    ECG personally reviewed by me today -regular sinus rhythm, 98, nonspecific T changes- no acute changes.  Labs dated October 29, 2022 from Care Everywhere:  Hemoglobin 12.9, hematocrit 38.1, WBC 7.0, platelets 299 Sodium 143, potassium 5, chloride 107, CO2 25.7, BUN 13, creatinine 0.73, glucose 97 Calcium 9.7, albumin 3.9,  total protein 7.4 Total bilirubin 0.2, alkaline phosphatase 123, AST 21, ALT 16 Magnesium 2.0 High-sensitivity troponin 7 BNP 10.91  Labs dated July 14, 2021 from Care Everywhere:  Total cholesterol 177, triglycerides 119, HDL 44.1, LDL 109  Assessment & Plan    1.  Precordial chest pain/nonobstructive coronary artery disease: Long history of chest pain and dyspnea with multiple evaluations including nonobstructive disease on catheterization October 2014 and more recently on coronary CT angiogram in January 2022, which showed minimal 1 to 24% LAD plaque with otherwise normal coronary arteries.  Previous right heart  catheterization showed only mildly elevated filling pressures.  Overall, the symptoms have been stable.  She does have some pleuritic chest pain in the setting of coughing as well as occasional sharp shooting chest pain but no clear angina.  ECG is unchanged.  She took herself off of beta-blocker and statin in effort to reduce reported medications have already discussed the role that explain her symptoms management and risk reduction, and she was willing to resume.  Continue aspirin and valsartan-HCTZ.  2.  Chronic HFpEF/dyspnea on exertion: Mildly elevated filling pressures on right heart catheterization in 2022.  Appears euvolemic on examination.  Currently off carvedilol, which being resumed in the setting of mildly elevated diastolic pressure as well as elevated heart rate.  Continue valsartan-HCTZ.  3.  Epigastric/abdominal pain: Ongoing.  Seen by GI with prior unremarkable EGD and colonoscopy per patient.  She remains on PPI therapy.  She plans to follow-up with GI in the future.  4.  Obstructive sleep apnea: On CPAP and followed by pulmonology.  5.  Essential hypertension: Patient to hold carvedilol.  Blood pressure 128/88 today with a heart rate of 98.  Resuming carvedilol.  Continue valsartan-HCTZ.  6.  Hyperlipidemia: LDL was 190 December 2022.  She has been off of her  statin and I am resuming Crestor 5 mg daily in the setting of nonobstructive CAD.  7.  Disposition: Patient will follow-up with GI and pulmonology regarding ongoing abdominal pain and cough.  Follow-up with cardiology clinic in 1 year or sooner if necessary.   Nicolasa Ducking, NP 11/09/2022, 9:38 AM

## 2023-01-16 ENCOUNTER — Other Ambulatory Visit: Payer: Self-pay | Admitting: Gastroenterology

## 2023-01-16 DIAGNOSIS — R1013 Epigastric pain: Secondary | ICD-10-CM

## 2023-01-16 DIAGNOSIS — R6881 Early satiety: Secondary | ICD-10-CM

## 2023-01-31 ENCOUNTER — Ambulatory Visit
Admission: RE | Admit: 2023-01-31 | Discharge: 2023-01-31 | Disposition: A | Discharge status: Home / Self Care | Payer: Managed Care, Other (non HMO) | Source: Ambulatory Visit | Attending: Gastroenterology | Admitting: Gastroenterology

## 2023-01-31 DIAGNOSIS — R6881 Early satiety: Secondary | ICD-10-CM

## 2023-01-31 DIAGNOSIS — R1013 Epigastric pain: Secondary | ICD-10-CM

## 2023-11-13 ENCOUNTER — Ambulatory Visit: Attending: Nurse Practitioner | Admitting: Nurse Practitioner

## 2023-11-13 ENCOUNTER — Encounter: Payer: Self-pay | Admitting: Nurse Practitioner

## 2023-11-13 VITALS — BP 152/80 | HR 83 | Ht 62.0 in | Wt 221.0 lb

## 2023-11-13 DIAGNOSIS — I5032 Chronic diastolic (congestive) heart failure: Secondary | ICD-10-CM | POA: Diagnosis not present

## 2023-11-13 DIAGNOSIS — R072 Precordial pain: Secondary | ICD-10-CM

## 2023-11-13 DIAGNOSIS — I10 Essential (primary) hypertension: Secondary | ICD-10-CM

## 2023-11-13 DIAGNOSIS — E782 Mixed hyperlipidemia: Secondary | ICD-10-CM

## 2023-11-13 DIAGNOSIS — G4733 Obstructive sleep apnea (adult) (pediatric): Secondary | ICD-10-CM

## 2023-11-13 MED ORDER — ISOSORBIDE MONONITRATE ER 30 MG PO TB24: 30.0000 mg | ORAL_TABLET | Freq: Every day | ORAL | 0 refills | Status: DC

## 2023-11-13 NOTE — Patient Instructions (Addendum)
 Medication Instructions:  START Imdur  30 mg once daily  *If you need a refill on your cardiac medications before your next appointment, please call your pharmacy*  Lab Work: None ordered If you have labs (blood work) drawn today and your tests are completely normal, you will receive your results only by: MyChart Message (if you have MyChart) OR A paper copy in the mail If you have any lab test that is abnormal or we need to change your treatment, we will call you to review the results.  Testing/Procedures: None ordered  Follow-Up: At [facility], you and your health needs are our priority.  As part of our continuing mission to provide you with exceptional heart care, our providers are all part of one team.  This team includes your primary Cardiologist (physician) and Advanced Practice Providers or APPs (Physician Assistants and Nurse Practitioners) who all work together to provide you with the care you need, when you need it.  Your next appointment:   3 month(s)  Provider:   Dr. Alvenia Aus  We recommend signing up for the patient portal called "MyChart".  Sign up information is provided on this After Visit Summary.  MyChart is used to connect with patients for Virtual Visits (Telemedicine).  Patients are able to view lab/test results, encounter notes, upcoming appointments, etc.  Non-urgent messages can be sent to your provider as well.   To learn more about what you can do with MyChart, go to [email or web address].

## 2023-11-13 NOTE — Progress Notes (Signed)
 Office Visit    Patient Name: Marie Vaughan Date of Encounter: 11/13/2023  Primary Care Provider:  Sowles, Krichna, MD Primary Cardiologist:  Antionette Kirks, MD  Chief Complaint    53 y.o. female with a history of chest pain and normal coronary arteries, hypertension, GERD, sleep apnea, diastolic dysfunction, and obesity, who presents for follow-up related to dyspnea.  Past Medical History  Subjective   Past Medical History:  Diagnosis Date   Acne    Acute bronchitis    Allergy    Angio-edema    Anxiety    Anxiety state, unspecified    Cervicalgia    Chronic dyspnea    a. 04/2021 RHC: RA 12/9, RV 31/7 (13), PA 29/16 (22), PCWP 12, CO 5.16 L/min; CI 2.58 L/min/m.   Contact lens/glasses fitting    wears contacts or glasses   Diastolic dysfunction    a. 11/2015 Echo: EF 55-60%, no rwma; b. 01/2021 Echo: EF 55-60%, no rwma, GrI DD, nl RV size/fxn.   Dysmenorrhea    GERD (gastroesophageal reflux disease)    History of stress test    a. Lexiscan 08/25/13: no sig ischemia, attenuation artifact noted, no sig WMA noted, EF 69%, no EKG changes concerning for ischemia   HTN (hypertension)    Iron deficiency anemia secondary to blood loss (chronic)    Migraine with aura, without mention of intractable migraine without mention of status migrainosus    "vestibular" per pt   Non-obstructive CAD (coronary artery disease)    a. 04/29/2014: LM nl, LAD no dz, D1 no dz, D2 very small vessel, D3 very small vessel, LCx nl, OM1 very small vessel, OM2 medium sized vessel, OM 3 no dz, RCA no dz, PDA no dzs; b. 12/2020 Cor CTA: Cor CA2+ = 21.8 (92nd%'ile), LM nl, LAD 1-24%p, D1 nl, LCX nl, RCA nl. Ao nl size w/o Ca2+, main PA dilated @ 28mm (? PAH).   Obesity    Other disorders of bone and cartilage(733.99)    Pain in thoracic spine    Pharyngitis    Tear of medial meniscus of left knee    Tear of medial meniscus of right knee    Vaginal delivery    x 3   Wears contact lenses    Past Surgical  History:  Procedure Laterality Date   ABDOMINAL HYSTERECTOMY  04-25-10   Due to abnormal PAP   CARDIAC CATHETERIZATION  04/2014   [facility]   COLONOSCOPY WITH PROPOFOL  N/A 07/11/2017   Procedure: COLONOSCOPY WITH PROPOFOL ;  Surgeon: Marnee Sink, MD;  Location: [facility];  Service: Endoscopy;  Laterality: N/A;   DILATION AND CURETTAGE OF UTERUS     ESOPHAGOGASTRODUODENOSCOPY (EGD) WITH PROPOFOL  N/A 07/11/2017   Procedure: ESOPHAGOGASTRODUODENOSCOPY (EGD) WITH PROPOFOL ;  Surgeon: Marnee Sink, MD;  Location: [facility];  Service: Endoscopy;  Laterality: N/A;  diabetic - diet controlled   GIVENS CAPSULE STUDY N/A 09/23/2017   Procedure: GIVENS CAPSULE STUDY;  Surgeon: Irby Mannan, MD;  Location: [facility];  Service: Endoscopy;  Laterality: N/A;   KNEE ARTHROSCOPY Left 06/10/2013   Procedure: LEFT KNEE ARTHROSCOPY KNEE WITH PARTIAL MEDIAL MENISCECTOMY ;  Surgeon: Genevie Kerns, MD;  Location: [facility];  Service: Orthopedics;  Laterality: Left;  xerofoam, 4x4's, webril, ace wrap, ice wrap   KNEE ARTHROSCOPY WITH MEDIAL MENISECTOMY Right 06/10/2013   Procedure: RIGHT KNEE ARTHROSCOPY WITH MEDIAL MENISECTOMY;  Surgeon: Genevie Kerns, MD;  Location: [facility];  Service: Orthopedics;  Laterality: Right;  partial lateral menisectomoy and chondroplasty   OVARIAN CYST REMOVAL  04-25-10   RIGHT HEART CATH Right 04/28/2021   Procedure: RIGHT HEART CATH;  Surgeon: Wenona Hamilton, MD;  Location: [facility];  Service: Cardiovascular;  Laterality: Right;    Allergies  Allergies  Allergen Reactions   Chocolate     Hives, itching   Chocolate Flavoring Agent (Non-Screening) Other (See Comments)    Hives, itching   Peanut-Containing Drug Products Swelling    cashews   Pineapple Itching and Swelling      History of Present Illness      53 y.o. y/o female with above past medical history including chest pain, hypertension, GERD,  obesity, anemia, sleep apnea, chronic pain, and migraines.  She was previously evaluated in 2015 in the setting of chest pain and underwent stress testing, which was low risk. Diagnostic catheterization was eventually performed in the setting of ongoing symptoms in October 2015, showing normal coronary arteries and normal LV function. Echocardiogram in 2017 again showed normal LV function without any significant valve disease. In June 2022, she again reported dyspnea with minimal activity and intermittent rest and exertional chest pain. Coronary CTA was performed showing a coronary calcium  score of 21.8 (92nd percentile), and minimal (1 to 24%) LAD disease. The main pulmonary artery was dilated to 28 mm. Aggressive risk factor modification was recommended. A follow-up visit in July 2022, she continued to report significant exertional dyspnea and intermittent chest pain. She was scheduled for right and left heart cardiac catheterization but initially canceled. At follow-up visit in September 2022, she again reported dyspnea on exertion and intermittent pleuritic type chest discomfort. Right heart catheterization was performed on October 7, showing high normal filling pressures with minimal pulmonary hypertension and normal cardiac output.    Marie Vaughan was last in cardiology clinic in April 2024 following [facility] ER visit for cough, dyspnea, and chest pain.  ER workup was unremarkable.  She was diagnosed with suspected bronchitis and prescribed albuterol  and steroids.  She continued to have chronic stable dyspnea on exertion and postprandial abdominal discomfort for which she was going to see GI.  Over the past year, she has continued to have intermittent resting exertional right-sided and centralized chest discomfort, occurring several days a week, lasting anywhere from a few seconds to minutes, and resolving spontaneously.  She also has chronic dyspnea on exertion.  Today, she also complains of intermittent  orthostatic lightheadedness.  She occasionally notes lower extremity swelling, especially if she is on her feet for long periods of time.  She denies palpitations, PND, orthopnea, syncope, or early satiety. Objective  Home Medications    Current Outpatient Medications  Medication Sig Dispense Refill   acetaminophen  (TYLENOL ) 500 MG tablet Take 500-1,000 mg by mouth every 6 (six) hours as needed (pain).     albuterol  (VENTOLIN  HFA) 108 (90 Base) MCG/ACT inhaler Inhale 1-2 puffs into the lungs every 4 (four) hours as needed.     aspirin  EC 81 MG tablet Take 1 tablet (81 mg total) by mouth daily. 90 tablet 3   Aspirin -Caffeine (BAYER BACK & BODY PAIN EX ST) 500-32.5 MG TABS Take 1-2 tablets by mouth daily as needed (pain).     carvedilol  (COREG ) 3.125 MG tablet Take 1 tablet (3.125 mg total) by mouth 2 (two) times daily. 180 tablet 3   diclofenac  sodium (VOLTAREN ) 1 % GEL Apply 4 g topically 4 (four) times daily. 300 g 0   gabapentin  (NEURONTIN ) 300  MG capsule Take 1-2 capsules (300-600 mg total) by mouth at bedtime. 30 capsule 1   omeprazole  (PRILOSEC) 40 MG capsule Take 40 mg by mouth daily as needed.     potassium chloride  (MICRO-K ) 10 MEQ CR capsule Take 1 capsule (10 mEq total) by mouth daily. 90 capsule 0   Ubrogepant  (UBRELVY ) 50 MG TABS Take 1-2 tablets by mouth daily as needed. 10 tablet 2   valsartan -hydrochlorothiazide  (DIOVAN -HCT) 160-25 MG tablet TAKE 1 TABLET BY MOUTH DAILY 90 tablet 2   isosorbide  mononitrate (IMDUR ) 30 MG 24 hr tablet Take 1 tablet (30 mg total) by mouth daily. 90 tablet 0   rosuvastatin  (CRESTOR ) 5 MG tablet Take 1 tablet (5 mg total) by mouth daily. 90 tablet 3   No current facility-administered medications for this visit.     Physical Exam    VS:  BP (!) 152/80   Pulse 83   Ht 5\' 2"  (1.575 m)   Wt 221 lb (100.2 kg)   SpO2 99%   BMI 40.42 kg/m  , BMI Body mass index is 40.42 kg/m.    Orthostatic VS for the past 24 hrs:  BP- Lying Pulse- Lying BP-  Sitting Pulse- Sitting BP- Standing at 0 minutes Pulse- Standing at 0 minutes  11/13/23 1448 120/81 79 (!) 147/93 77 (!) 155/108 82      GEN: Obese, in no acute distress. HEENT: normal. Neck: Obese, difficult to gauge JVP.  No bruits or masses. Cardiac: RRR, no murmurs, rubs, or gallops. No clubbing, cyanosis, edema.  Radials 2+/PT 2+ and equal bilaterally.  Respiratory:  Respirations regular and unlabored, clear to auscultation bilaterally. GI: Soft, nontender, nondistended, BS + x 4. MS: no deformity or atrophy. Skin: warm and dry, no rash. Neuro:  Strength and sensation are intact. Psych: Normal affect.  Accessory Clinical Findings    ECG personally reviewed by me today - EKG Interpretation Date/Time:  Wednesday November 13 2023 14:07:30 EDT Ventricular Rate:  83 PR Interval:  168 QRS Duration:  92 QT Interval:  394 QTC Calculation: 462 R Axis:   20  Text Interpretation: Normal sinus rhythm Nonspecific T wave abnormality Prolonged QT Confirmed by Laneta Pintos [phone]) on 11/13/2023 2:15:53 PM  - no acute changes.  Labs dated May 10, 2023 in Care Everywhere:  Hemoglobin 13.0, hematocrit 40.3, WBC 6.3, platelets 307 Sodium 143, potassium 3.5, chloride 105, CO2 30.3, BUN 10, creatinine 0.8, glucose 87 Magnesium  2.0 TSH 0.504 Hemoglobin A1c 6.2  Labs dated July 14, 2021 in Care Everywhere:  Total cholesterol 177, triglycerides 119, HDL 44.1, LDL 109    Assessment & Plan    1.  Precordial chest pain/nonobstructive CAD: Long history of chest pain dating back greater than 10 years with nonobstructive CAD on catheterization October 2014 followed by minimal plaque noted in the LAD and coronary CT angiogram in January 2022.  She has also previously undergone right heart catheterization which showed only mildly elevated filling pressures out of proportion to her complaints of dyspnea.  She says that over the past year, she has continued to have intermittent rest and  exertional chest pain that usually starts on the right side and then moves quickly to her sternum.  Symptoms are typically fleeting though can last a few minutes, and resolve spontaneously or after she takes Tylenol  or ibuprofen .  She is very frustrated.  We discussed her ongoing symptoms in the context of prior workup.  Reassurance offered.  We agreed to try isosorbide  mononitrate 30 mg daily and  deferred any additional testing at this time given prior results during the same symptoms in the past.  She remains on aspirin , beta-blocker, and statin therapy.  2.  Chronic HFpEF/dyspnea on exertion: Mildly elevated filling pressures on right heart catheterization in 2022.  Despite this, she continues to have chronic dyspnea on exertion.  Body habitus makes exam somewhat challenging though she does not appear to be volume overloaded today.  Since September 2023, her weight is up 15 pounds.  She does not routinely exercise.  I suspect deconditioning is playing a role in her dyspnea.  Blood pressure initially elevated today.  Adding long-acting nitrate as outlined above.  She otherwise remains on beta-blocker and valsartan -HCTZ.  I suspect she would benefit greatly from a GLP-1 agonist, though does have a history of chronic abdominal pain.  3.  Primary hypertension: Blood pressure elevated at rest today on multiple recordings.  Adding long-acting nitrate in the setting of ongoing chest pain.  Could consider further titration of valsartan  in the future.  4.  Hyperlipidemia: LDL of 109 in December 2022.  She has not had lipids checked since then.  Labs are typically drawn by primary care.  She is not fasting today.  She has been back on rosuvastatin  5 mg ever since having those lipids checked in 2022.  If not checked prior to her next visit, we can arrange at that time.  5.  Obstructive sleep apnea: On CPAP and followed by pulmonology.  6.  Disposition: Follow-up in 3 months or sooner if necessary.  Laneta Pintos, NP 11/13/2023, 5:24 PM

## 2023-11-18 ENCOUNTER — Telehealth: Payer: Self-pay | Admitting: Cardiovascular Disease

## 2023-11-18 NOTE — Telephone Encounter (Signed)
 Pt returning call to a nurse

## 2023-11-18 NOTE — Telephone Encounter (Signed)
 Attempted to contact patient to discuss.   LVM to call back to discuss further.

## 2023-11-18 NOTE — Telephone Encounter (Signed)
 Pt c/o BP issue: STAT if pt c/o blurred vision, one-sided weakness or slurred speech  1. What are your last 5 BP readings? 95/60 right arm 89/58 l eft arm; 103/69 right; 100/68 left   2. Are you having any other symptoms (ex. Dizziness, headache, blurred vision, passed out)? No energy, feeling like zombie   3. What is your BP issue?  Running low

## 2023-11-18 NOTE — Telephone Encounter (Signed)
 Called patient, she states since starting on the Imdur  and her Valsartan  mix, her blood pressures have been extremely low- (see below) she feels weak and tired, unable to do anything. She was advised I would send a message, but if blood pressure became low and she felt like she would pass out to call 911 for evaluation. Patient verbalized understanding.

## 2023-11-19 ENCOUNTER — Other Ambulatory Visit: Payer: Self-pay | Admitting: Gastroenterology

## 2023-11-19 ENCOUNTER — Ambulatory Visit
Admission: RE | Admit: 2023-11-19 | Discharge: 2023-11-19 | Disposition: A | Discharge status: Home / Self Care | Source: Ambulatory Visit | Attending: Gastroenterology | Admitting: Gastroenterology

## 2023-11-19 DIAGNOSIS — R079 Chest pain, unspecified: Secondary | ICD-10-CM

## 2023-11-19 DIAGNOSIS — R1032 Left lower quadrant pain: Secondary | ICD-10-CM | POA: Insufficient documentation

## 2023-11-19 NOTE — Telephone Encounter (Signed)
 Stop Imdur .  We added just to see if it helped chest pain symptoms.  No need to continue if dropping blood pressure.

## 2023-11-19 NOTE — Telephone Encounter (Signed)
 Called patient to follow up on provider recommendations. She claims she did not take the imdur  today, she has felt super sluggish waking up and fatigued today and when she saw her GI specialist today her BP was 90's/60's. And she felt so tired she could fall asleep. Told her from now on to just stop taking the blood pressure medication and to follow up as needed but still to keep track of blood pressure at home. Also advised it may take a few days to see a difference, as it needs to get out of her system. Pt verbalized understanding of information provided.

## 2024-02-19 ENCOUNTER — Encounter: Payer: Self-pay | Admitting: Cardiovascular Disease

## 2024-02-19 ENCOUNTER — Ambulatory Visit: Attending: Cardiovascular Disease | Admitting: Cardiovascular Disease

## 2024-02-19 VITALS — BP 136/76 | HR 86 | Ht 61.0 in | Wt 222.0 lb

## 2024-02-19 DIAGNOSIS — I25118 Atherosclerotic heart disease of native coronary artery with other forms of angina pectoris: Secondary | ICD-10-CM

## 2024-02-19 DIAGNOSIS — R072 Precordial pain: Secondary | ICD-10-CM

## 2024-02-19 DIAGNOSIS — I5032 Chronic diastolic (congestive) heart failure: Secondary | ICD-10-CM

## 2024-02-19 DIAGNOSIS — I10 Essential (primary) hypertension: Secondary | ICD-10-CM

## 2024-02-19 NOTE — Progress Notes (Signed)
 Cardiology Office Note   Date:  02/19/2024   ID:  Marie Vaughan, DOB 1970/10/19, MRN [id number]  PCP:  Glenard Mire, MD  Cardiologist:   Deatrice Cage, MD   Chief Complaint  Patient presents with   3 month follow up     Patient c/o chest tightness, shortness of breath & breaks out into a sweat.       History of Present Illness: Marie Vaughan is a 53 y.o. female who presents for a follow-up visit regarding chest pain and shortness of breath. She has history of normal coronary arteries on cardiac catheterization 2015, essential hypertension, GERD, obstructive sleep apnea not on CPAP and obesity.  She had recurrent chest pain and underwent cardiac CTA in 2022 which showed mild nonobstructive coronary artery disease with calcium  score of 22.  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  Echocardiogram was done which showed an EF of 55 to 60% with grade 1 diastolic dysfunction and no significant valvular abnormalities.  Pulmonary pressure could not be estimated.  Right heart catheterization in 2022 showed high normal filling pressures with minimal pulmonary hypertension.  She was seen recently in April for intermittent chest pain and shortness of breath.  She was started on small dose Imdur  30 mg daily.  She did not tolerate the medication due to hypotension and she stopped after few days.  It did not improve her chest pain anyway.  She returns with similar symptoms and if anything she feels worse.  She continues to have intermittent chest tightness and shortness of breath with minimal exertion.  She also has episodes of nocturnal chest pain and night sweats.  She does not tolerate her CPAP very well.  She is very frustrated with her symptoms. She was told about possible pulmonary hypertension.   Past Medical History:  Diagnosis Date   Acne    Acute bronchitis    Allergy    Angio-edema    Anxiety    Anxiety state, unspecified    Cervicalgia    Chronic dyspnea    a.  04/2021 RHC: RA 12/9, RV 31/7 (13), PA 29/16 (22), PCWP 12, CO 5.16 L/min; CI 2.58 L/min/m.   Contact lens/glasses fitting    wears contacts or glasses   Diastolic dysfunction    a. 11/2015 Echo: EF 55-60%, no rwma; b. 01/2021 Echo: EF 55-60%, no rwma, GrI DD, nl RV size/fxn.   Dysmenorrhea    GERD (gastroesophageal reflux disease)    History of stress test    a. Lexiscan 08/25/13: no sig ischemia, attenuation artifact noted, no sig WMA noted, EF 69%, no EKG changes concerning for ischemia   HTN (hypertension)    Iron deficiency anemia secondary to blood loss (chronic)    Migraine with aura, without mention of intractable migraine without mention of status migrainosus    vestibular per pt   Non-obstructive CAD (coronary artery disease)    a. 04/29/2014: LM nl, LAD no dz, D1 no dz, D2 very small vessel, D3 very small vessel, LCx nl, OM1 very small vessel, OM2 medium sized vessel, OM 3 no dz, RCA no dz, PDA no dzs; b. 12/2020 Cor CTA: Cor CA2+ = 21.8 (92nd%'ile), LM nl, LAD 1-24%p, D1 nl, LCX nl, RCA nl. Ao nl size w/o Ca2+, main PA dilated @ 28mm (? PAH).   Obesity    Other disorders of bone and cartilage(733.99)    Pain in thoracic spine    Pharyngitis    Tear of medial  meniscus of left knee    Tear of medial meniscus of right knee    Vaginal delivery    x 3   Wears contact lenses     Past Surgical History:  Procedure Laterality Date   ABDOMINAL HYSTERECTOMY  04-25-10   Due to abnormal PAP   CARDIAC CATHETERIZATION  04/2014   [facility]   COLONOSCOPY WITH PROPOFOL  N/A 07/11/2017   Procedure: COLONOSCOPY WITH PROPOFOL ;  Surgeon: Jinny Carmine, MD;  [facility]: [facility];  Service: Endoscopy;  Laterality: N/A;   DILATION AND CURETTAGE OF UTERUS     ESOPHAGOGASTRODUODENOSCOPY (EGD) WITH PROPOFOL  N/A 07/11/2017   Procedure: ESOPHAGOGASTRODUODENOSCOPY (EGD) WITH PROPOFOL ;  Surgeon: Jinny Carmine, MD;  Location: [facility];  Service: Endoscopy;  Laterality: N/A;  diabetic - diet  controlled   GIVENS CAPSULE STUDY N/A 09/23/2017   Procedure: GIVENS CAPSULE STUDY;  Surgeon: Janalyn Keene NOVAK, MD;  Location: [facility];  Service: Endoscopy;  Laterality: N/A;   KNEE ARTHROSCOPY Left 06/10/2013   Procedure: LEFT KNEE ARTHROSCOPY KNEE WITH PARTIAL MEDIAL MENISCECTOMY ;  Surgeon: Lamar DELENA Millman, MD;  Location: [facility];  Service: Orthopedics;  Laterality: Left;  xerofoam, 4x4's, webril, ace wrap, ice wrap   KNEE ARTHROSCOPY WITH MEDIAL MENISECTOMY Right 06/10/2013   Procedure: RIGHT KNEE ARTHROSCOPY WITH MEDIAL MENISECTOMY;  Surgeon: Lamar DELENA Millman, MD;  Location: [facility];  Service: Orthopedics;  Laterality: Right;  partial lateral menisectomoy and chondroplasty   OVARIAN CYST REMOVAL  04-25-10   RIGHT HEART CATH Right 04/28/2021   Procedure: RIGHT HEART CATH;  Surgeon: Darron Deatrice DELENA, MD;  Location: [facility];  Service: Cardiovascular;  Laterality: Right;     Current Outpatient Medications  Medication Sig Dispense Refill   acetaminophen  (TYLENOL ) 500 MG tablet Take 500-1,000 mg by mouth every 6 (six) hours as needed (pain).     albuterol  (VENTOLIN  HFA) 108 (90 Base) MCG/ACT inhaler Inhale 1-2 puffs into the lungs every 4 (four) hours as needed.     aspirin  EC 81 MG tablet Take 1 tablet (81 mg total) by mouth daily. 90 tablet 3   Aspirin -Caffeine (BAYER BACK & BODY PAIN EX ST) 500-32.5 MG TABS Take 1-2 tablets by mouth daily as needed (pain).     carvedilol  (COREG ) 3.125 MG tablet Take 1 tablet (3.125 mg total) by mouth 2 (two) times daily. 180 tablet 3   diclofenac  sodium (VOLTAREN ) 1 % GEL Apply 4 g topically 4 (four) times daily. 300 g 0   gabapentin  (NEURONTIN ) 300 MG capsule Take 1-2 capsules (300-600 mg total) by mouth at bedtime. 30 capsule 1   isosorbide  mononitrate (IMDUR ) 30 MG 24 hr tablet Take 1 tablet (30 mg total) by mouth daily. 90 tablet 0   omeprazole  (PRILOSEC) 40 MG capsule Take 40 mg by mouth daily as  needed.     potassium chloride  (MICRO-K ) 10 MEQ CR capsule Take 1 capsule (10 mEq total) by mouth daily. 90 capsule 0   rosuvastatin  (CRESTOR ) 5 MG tablet Take 1 tablet (5 mg total) by mouth daily. 90 tablet 3   Ubrogepant  (UBRELVY ) 50 MG TABS Take 1-2 tablets by mouth daily as needed. 10 tablet 2   valsartan -hydrochlorothiazide  (DIOVAN -HCT) 160-25 MG tablet TAKE 1 TABLET BY MOUTH DAILY 90 tablet 2   No current facility-administered medications for this visit.    Allergies:   Iodinated contrast media, Chocolate, Chocolate flavoring agent (non-screening), Flavoring agent, Peanut-containing drug products, and Pineapple    Social History:  The patient  reports that she has never smoked. She has never used smokeless tobacco. She reports that she does not drink alcohol and does not use drugs.   Family History:  The patient's family history includes Alcohol abuse in her maternal uncle and another family member; Arthritis in an other family member; Diabetes in her maternal aunt and mother; Early death in her mother; Early death (age of onset: 58) in her sister; Gout in her mother; Heart disease in an other family member; Hyperlipidemia in her maternal aunt; Hypertension in her maternal aunt and mother; Kidney failure in her mother; Other in an other family member; Prostate cancer in her maternal uncle.    ROS:  Please see the history of present illness.   Otherwise, review of systems are positive for none.   All other systems are reviewed and negative.    PHYSICAL EXAM: VS:  BP 136/76 (BP Location: Left Arm, Patient Position: Sitting, Cuff Size: Normal)   Pulse 86   Ht 5' 1 (1.549 m)   Wt 222 lb (100.7 kg)   SpO2 98%   BMI 41.95 kg/m  , BMI Body mass index is 41.95 kg/m. GEN: Well nourished, well developed, in no acute distress  HEENT: normal  Neck: no JVD, carotid bruits, or masses Cardiac: RRR; no murmurs, rubs, or gallops,no edema  Respiratory:  clear to auscultation bilaterally, normal  work of breathing GI: soft, nontender, nondistended, + BS MS: no deformity or atrophy  Skin: warm and dry, no rash Neuro:  Strength and sensation are intact Psych: euthymic mood, full affect   EKG:  EKG is ordered today. Normal sinus rhythm Nonspecific T wave abnormality When compared with ECG of 13-Nov-2023 14:07, No significant change was found    Recent Labs: No results found for requested labs within last 365 days.    Lipid Panel    Component Value Date/Time   CHOL 162 10/29/2019 1351   TRIG 191 (H) 10/29/2019 1351   HDL 48 10/29/2019 1351   CHOLHDL 3.4 10/29/2019 1351   VLDL 38 10/29/2019 1351   LDLCALC 76 10/29/2019 1351      Wt Readings from Last 3 Encounters:  02/19/24 222 lb (100.7 kg)  11/13/23 221 lb (100.2 kg)  11/09/22 219 lb (99.3 kg)           No data to display            ASSESSMENT AND PLAN:  1.  Coronary artery disease involving native coronary arteries with other forms of angina: The patient continues to have intermittent chest pain and dyspnea with minimal exertion.  No improvement in symptoms with isosorbide .  Previous workup in the past revealed mild coronary artery disease.   There has also been a concern about pulmonary hypertension considering her untreated sleep apnea.  I discussed different management options with her and given persistent symptoms in spite of medical therapy, recommend right and left cardiac catheterization with plans for coronary microvascular dysfunction testing.  I discussed procedure in details as well as risk and benefits.  2.  Obstructive sleep apnea: Poor tolerance to CPAP.  Given concerns about pulmonary hypertension we will do a right heart catheterization at the same time.  I discussed with her the importance of treatment of this.  This could be responsible for some of her nighttime episodes.  3.  Essential hypertension: Blood pressure is controlled.  4.  Chronic diastolic heart failure: She likely has a  component of this but evaluating her volume status  has been difficult by exam.  The right heart catheterization should help with diagnosis.  5.  Obesity: Suspect a significant component of physical deconditioning leading to some of her symptoms.   Informed Consent   Shared Decision Making/Informed Consent The risks [stroke (1 in 1000), death (1 in 1000), kidney failure [usually temporary] (1 in 500), bleeding (1 in 200), allergic reaction [possibly serious] (1 in 200)], benefits (diagnostic support and management of coronary artery disease) and alternatives of a cardiac catheterization were discussed in detail with Ms. Wiemann and she is willing to proceed.      Disposition:   Proceed with a right and left cardiac catheterization and follow-up in 1 month  Signed,  Deatrice Cage, MD  02/19/2024 4:24 PM    [facility]

## 2024-02-19 NOTE — Patient Instructions (Addendum)
 Medication Instructions:   Your physician recommends that you continue on your current medications as directed. Please refer to the Current Medication list given to you today.   *If you need a refill on your cardiac medications before your next appointment, please call your pharmacy*  Lab Work:  Your provider would like for you to have following labs drawn today CBC, BMP.    If you have labs (blood work) drawn today and your tests are completely normal, you will receive your results only by: MyChart Message (if you have MyChart) OR A paper copy in the mail If you have any lab test that is abnormal or we need to change your treatment, we will call you to review the results.  Testing/Procedures:   [place] A DEPT OF [place] [place] [address] [place] [address] Dept: [phone] Loc: [phone]  IONE SANDUSKY  02/19/2024  You are scheduled for a Cardiac Catheterization on Monday, August 11 with Dr. Deatrice Cage.  1. Please arrive at the [facility] Entrance of [facility], [address] [place], [place] [address] at ______________ (This is 1 hour(s) prior to your procedure time).  Proceed to the Check-In Desk directly inside the entrance.  Procedure Parking: Use the entrance off of the [address] side of the hospital. Turn right upon entering and follow the driveway to parking that is directly in front of the [facility] Center. There is no valet parking available at this entrance, however there is an awning directly in front of the [facility] Vascular Center for drop off/ pick up for patients.  Special note: Every effort is made to have your procedure done on time. Please understand that emergencies sometimes delay scheduled procedures.  2. Diet: Do not eat solid foods after midnight.  You need to be well hydrated before procedure time.  You may drink water until you leave fore  the hospital.  On the way to the hospital, please drink 17 ounces of water.  3. Labs: You will need to have blood drawn on Friday, August 1 at [facility], Go to 1st desk on your right to register.  Address: [address] [place], [place] [address]  Open: 8am - 5pm  Phone: [phone]. You do not need to be fasting.  4. Medication instructions in preparation for your procedure:   Contrast Allergy: Yes,  Please take Prednisone  50mg  by mouth at: Thirteen hours prior to cath AND again at: Seven hours prior to cath  And prior to leaving home please take last dose of Prednisone  50mg  and Benadryl  50mg  by mouth.  DIURETICS: Hold your Valsartan -Hydrochlorothiazide  (DIOVAN -HCT) 160-25 mg table morning of cardiac catherization.  On the morning of your procedure, take your Aspirin  81 mg and any morning medicines NOT listed above.    5. Plan to go home the same day, you will only stay overnight if medically necessary. 6. Bring a current list of your medications and current insurance cards. 7. You MUST have a responsible person to drive you home. 8. Someone MUST be with you the first 24 hours after you arrive home or your discharge will be delayed. 9. Please wear clothes that are easy to get on and off and wear slip-on shoes.  Thank you for allowing us  to care for you!   -- [facility]   Follow-Up: At [facility], you and your health needs are  our priority.  As part of our continuing mission to provide you with exceptional heart care, our providers are all part of one team.  This team includes your primary Cardiologist (physician) and Advanced Practice Providers or APPs (Physician Assistants and Nurse Practitioners) who all work together to provide you with the care you need, when you need it.  Your next appointment:   1 month(s)  Provider:   You will see one of the following Advanced Practice Providers on your designated Care Team:    Lonni Meager, NP

## 2024-02-19 NOTE — H&P (View-Only) (Signed)
 Cardiology Office Note   Date:  02/19/2024   ID:  Marie Vaughan, DOB 1970/10/19, MRN [id number]  PCP:  Glenard Mire, MD  Cardiologist:   Deatrice Cage, MD   Chief Complaint  Patient presents with   3 month follow up     Patient c/o chest tightness, shortness of breath & breaks out into a sweat.       History of Present Illness: Marie Vaughan is a 53 y.o. female who presents for a follow-up visit regarding chest pain and shortness of breath. She has history of normal coronary arteries on cardiac catheterization 2015, essential hypertension, GERD, obstructive sleep apnea not on CPAP and obesity.  She had recurrent chest pain and underwent cardiac CTA in 2022 which showed mild nonobstructive coronary artery disease with calcium  score of 22.  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  Echocardiogram was done which showed an EF of 55 to 60% with grade 1 diastolic dysfunction and no significant valvular abnormalities.  Pulmonary pressure could not be estimated.  Right heart catheterization in 2022 showed high normal filling pressures with minimal pulmonary hypertension.  She was seen recently in April for intermittent chest pain and shortness of breath.  She was started on small dose Imdur  30 mg daily.  She did not tolerate the medication due to hypotension and she stopped after few days.  It did not improve her chest pain anyway.  She returns with similar symptoms and if anything she feels worse.  She continues to have intermittent chest tightness and shortness of breath with minimal exertion.  She also has episodes of nocturnal chest pain and night sweats.  She does not tolerate her CPAP very well.  She is very frustrated with her symptoms. She was told about possible pulmonary hypertension.   Past Medical History:  Diagnosis Date   Acne    Acute bronchitis    Allergy    Angio-edema    Anxiety    Anxiety state, unspecified    Cervicalgia    Chronic dyspnea    a.  04/2021 RHC: RA 12/9, RV 31/7 (13), PA 29/16 (22), PCWP 12, CO 5.16 L/min; CI 2.58 L/min/m.   Contact lens/glasses fitting    wears contacts or glasses   Diastolic dysfunction    a. 11/2015 Echo: EF 55-60%, no rwma; b. 01/2021 Echo: EF 55-60%, no rwma, GrI DD, nl RV size/fxn.   Dysmenorrhea    GERD (gastroesophageal reflux disease)    History of stress test    a. Lexiscan 08/25/13: no sig ischemia, attenuation artifact noted, no sig WMA noted, EF 69%, no EKG changes concerning for ischemia   HTN (hypertension)    Iron deficiency anemia secondary to blood loss (chronic)    Migraine with aura, without mention of intractable migraine without mention of status migrainosus    vestibular per pt   Non-obstructive CAD (coronary artery disease)    a. 04/29/2014: LM nl, LAD no dz, D1 no dz, D2 very small vessel, D3 very small vessel, LCx nl, OM1 very small vessel, OM2 medium sized vessel, OM 3 no dz, RCA no dz, PDA no dzs; b. 12/2020 Cor CTA: Cor CA2+ = 21.8 (92nd%'ile), LM nl, LAD 1-24%p, D1 nl, LCX nl, RCA nl. Ao nl size w/o Ca2+, main PA dilated @ 28mm (? PAH).   Obesity    Other disorders of bone and cartilage(733.99)    Pain in thoracic spine    Pharyngitis    Tear of medial  meniscus of left knee    Tear of medial meniscus of right knee    Vaginal delivery    x 3   Wears contact lenses     Past Surgical History:  Procedure Laterality Date   ABDOMINAL HYSTERECTOMY  04-25-10   Due to abnormal PAP   CARDIAC CATHETERIZATION  04/2014   [facility]   COLONOSCOPY WITH PROPOFOL  N/A 07/11/2017   Procedure: COLONOSCOPY WITH PROPOFOL ;  Surgeon: Jinny Carmine, MD;  Location: [facility];  Service: Endoscopy;  Laterality: N/A;   DILATION AND CURETTAGE OF UTERUS     ESOPHAGOGASTRODUODENOSCOPY (EGD) WITH PROPOFOL  N/A 07/11/2017   Procedure: ESOPHAGOGASTRODUODENOSCOPY (EGD) WITH PROPOFOL ;  Surgeon: Jinny Carmine, MD;  Location: [facility];  Service: Endoscopy;  Laterality: N/A;  diabetic - diet  controlled   GIVENS CAPSULE STUDY N/A 09/23/2017   Procedure: GIVENS CAPSULE STUDY;  Surgeon: Janalyn Keene NOVAK, MD;  Location: [facility];  Service: Endoscopy;  Laterality: N/A;   KNEE ARTHROSCOPY Left 06/10/2013   Procedure: LEFT KNEE ARTHROSCOPY KNEE WITH PARTIAL MEDIAL MENISCECTOMY ;  Surgeon: Lamar DELENA Millman, MD;  Location: [facility];  Service: Orthopedics;  Laterality: Left;  xerofoam, 4x4's, webril, ace wrap, ice wrap   KNEE ARTHROSCOPY WITH MEDIAL MENISECTOMY Right 06/10/2013   Procedure: RIGHT KNEE ARTHROSCOPY WITH MEDIAL MENISECTOMY;  Surgeon: Lamar DELENA Millman, MD;  Location: [facility];  Service: Orthopedics;  Laterality: Right;  partial lateral menisectomoy and chondroplasty   OVARIAN CYST REMOVAL  04-25-10   RIGHT HEART CATH Right 04/28/2021   Procedure: RIGHT HEART CATH;  Surgeon: Darron Deatrice DELENA, MD;  Location: [facility];  Service: Cardiovascular;  Laterality: Right;     Current Outpatient Medications  Medication Sig Dispense Refill   acetaminophen  (TYLENOL ) 500 MG tablet Take 500-1,000 mg by mouth every 6 (six) hours as needed (pain).     albuterol  (VENTOLIN  HFA) 108 (90 Base) MCG/ACT inhaler Inhale 1-2 puffs into the lungs every 4 (four) hours as needed.     aspirin  EC 81 MG tablet Take 1 tablet (81 mg total) by mouth daily. 90 tablet 3   Aspirin -Caffeine (BAYER BACK & BODY PAIN EX ST) 500-32.5 MG TABS Take 1-2 tablets by mouth daily as needed (pain).     carvedilol  (COREG ) 3.125 MG tablet Take 1 tablet (3.125 mg total) by mouth 2 (two) times daily. 180 tablet 3   diclofenac  sodium (VOLTAREN ) 1 % GEL Apply 4 g topically 4 (four) times daily. 300 g 0   gabapentin  (NEURONTIN ) 300 MG capsule Take 1-2 capsules (300-600 mg total) by mouth at bedtime. 30 capsule 1   isosorbide  mononitrate (IMDUR ) 30 MG 24 hr tablet Take 1 tablet (30 mg total) by mouth daily. 90 tablet 0   omeprazole  (PRILOSEC) 40 MG capsule Take 40 mg by mouth daily as  needed.     potassium chloride  (MICRO-K ) 10 MEQ CR capsule Take 1 capsule (10 mEq total) by mouth daily. 90 capsule 0   rosuvastatin  (CRESTOR ) 5 MG tablet Take 1 tablet (5 mg total) by mouth daily. 90 tablet 3   Ubrogepant  (UBRELVY ) 50 MG TABS Take 1-2 tablets by mouth daily as needed. 10 tablet 2   valsartan -hydrochlorothiazide  (DIOVAN -HCT) 160-25 MG tablet TAKE 1 TABLET BY MOUTH DAILY 90 tablet 2   No current facility-administered medications for this visit.    Allergies:   Iodinated contrast media, Chocolate, Chocolate flavoring agent (non-screening), Flavoring agent, Peanut-containing drug products, and Pineapple    Social History:  The patient  reports that she has never smoked. She has never used smokeless tobacco. She reports that she does not drink alcohol and does not use drugs.   Family History:  The patient's family history includes Alcohol abuse in her maternal uncle and another family member; Arthritis in an other family member; Diabetes in her maternal aunt and mother; Early death in her mother; Early death (age of onset: 58) in her sister; Gout in her mother; Heart disease in an other family member; Hyperlipidemia in her maternal aunt; Hypertension in her maternal aunt and mother; Kidney failure in her mother; Other in an other family member; Prostate cancer in her maternal uncle.    ROS:  Please see the history of present illness.   Otherwise, review of systems are positive for none.   All other systems are reviewed and negative.    PHYSICAL EXAM: VS:  BP 136/76 (BP Location: Left Arm, Patient Position: Sitting, Cuff Size: Normal)   Pulse 86   Ht 5' 1 (1.549 m)   Wt 222 lb (100.7 kg)   SpO2 98%   BMI 41.95 kg/m  , BMI Body mass index is 41.95 kg/m. GEN: Well nourished, well developed, in no acute distress  HEENT: normal  Neck: no JVD, carotid bruits, or masses Cardiac: RRR; no murmurs, rubs, or gallops,no edema  Respiratory:  clear to auscultation bilaterally, normal  work of breathing GI: soft, nontender, nondistended, + BS MS: no deformity or atrophy  Skin: warm and dry, no rash Neuro:  Strength and sensation are intact Psych: euthymic mood, full affect   EKG:  EKG is ordered today. Normal sinus rhythm Nonspecific T wave abnormality When compared with ECG of 13-Nov-2023 14:07, No significant change was found    Recent Labs: No results found for requested labs within last 365 days.    Lipid Panel    Component Value Date/Time   CHOL 162 10/29/2019 1351   TRIG 191 (H) 10/29/2019 1351   HDL 48 10/29/2019 1351   CHOLHDL 3.4 10/29/2019 1351   VLDL 38 10/29/2019 1351   LDLCALC 76 10/29/2019 1351      Wt Readings from Last 3 Encounters:  02/19/24 222 lb (100.7 kg)  11/13/23 221 lb (100.2 kg)  11/09/22 219 lb (99.3 kg)           No data to display            ASSESSMENT AND PLAN:  1.  Coronary artery disease involving native coronary arteries with other forms of angina: The patient continues to have intermittent chest pain and dyspnea with minimal exertion.  No improvement in symptoms with isosorbide .  Previous workup in the past revealed mild coronary artery disease.   There has also been a concern about pulmonary hypertension considering her untreated sleep apnea.  I discussed different management options with her and given persistent symptoms in spite of medical therapy, recommend right and left cardiac catheterization with plans for coronary microvascular dysfunction testing.  I discussed procedure in details as well as risk and benefits.  2.  Obstructive sleep apnea: Poor tolerance to CPAP.  Given concerns about pulmonary hypertension we will do a right heart catheterization at the same time.  I discussed with her the importance of treatment of this.  This could be responsible for some of her nighttime episodes.  3.  Essential hypertension: Blood pressure is controlled.  4.  Chronic diastolic heart failure: She likely has a  component of this but evaluating her volume status  has been difficult by exam.  The right heart catheterization should help with diagnosis.  5.  Obesity: Suspect a significant component of physical deconditioning leading to some of her symptoms.   Informed Consent   Shared Decision Making/Informed Consent The risks [stroke (1 in 1000), death (1 in 1000), kidney failure [usually temporary] (1 in 500), bleeding (1 in 200), allergic reaction [possibly serious] (1 in 200)], benefits (diagnostic support and management of coronary artery disease) and alternatives of a cardiac catheterization were discussed in detail with Ms. Wiemann and she is willing to proceed.      Disposition:   Proceed with a right and left cardiac catheterization and follow-up in 1 month  Signed,  Deatrice Cage, MD  02/19/2024 4:24 PM    [facility]

## 2024-02-20 ENCOUNTER — Other Ambulatory Visit: Payer: Self-pay | Admitting: *Deleted

## 2024-02-20 MED ORDER — PREDNISONE 50 MG PO TABS: ORAL_TABLET | ORAL | 0 refills | Status: DC

## 2024-02-20 MED ORDER — DIPHENHYDRAMINE HCL 50 MG PO TABS: 50.0000 mg | ORAL_TABLET | Freq: Once | ORAL | 0 refills | Status: DC

## 2024-02-20 NOTE — Progress Notes (Signed)
 Called and spoke with patient; Pt verified that she did need the medications for Contrast Dye Allergy. Prescription was ordered for both Prednisone  and Benadryl  with instructions on how to take prior to arriving for cardiac cath to [facility], [facility].  Pt informed that right and left heart catherization scheduled with Dr. Deatrice Cage on March 02, 2024 at 9:30.  Instructed pt to arrive @ 8:30 a at the [facility].  Pt verbalized understanding.

## 2024-02-21 NOTE — Addendum Note (Signed)
 Addended by: HARL HERON DEL on: 02/21/2024 10:01 AM   Modules accepted: Orders

## 2024-02-27 ENCOUNTER — Other Ambulatory Visit
Admission: RE | Admit: 2024-02-27 | Discharge: 2024-02-27 | Disposition: A | Discharge status: Home / Self Care | Source: Ambulatory Visit | Attending: Cardiovascular Disease | Admitting: Cardiovascular Disease

## 2024-02-27 ENCOUNTER — Telehealth: Payer: Self-pay | Admitting: *Deleted

## 2024-02-27 DIAGNOSIS — R072 Precordial pain: Secondary | ICD-10-CM | POA: Diagnosis present

## 2024-02-27 DIAGNOSIS — I25118 Atherosclerotic heart disease of native coronary artery with other forms of angina pectoris: Secondary | ICD-10-CM | POA: Diagnosis present

## 2024-02-27 LAB — CBC
HCT: 41 % (ref 36.0–46.0)
Hemoglobin: 13.3 g/dL (ref 12.0–15.0)
MCH: 27.7 pg (ref 26.0–34.0)
MCHC: 32.4 g/dL (ref 30.0–36.0)
MCV: 85.2 fL (ref 80.0–100.0)
Platelets: 311 K/uL (ref 150–400)
RBC: 4.81 MIL/uL (ref 3.87–5.11)
RDW: 13.5 % (ref 11.5–15.5)
WBC: 8.3 K/uL (ref 4.0–10.5)
nRBC: 0 % (ref 0.0–0.2)

## 2024-02-27 LAB — BASIC METABOLIC PANEL WITH GFR
Anion gap: 10 (ref 5–15)
BUN: 10 mg/dL (ref 6–20)
CO2: 24 mmol/L (ref 22–32)
Calcium: 9.5 mg/dL (ref 8.9–10.3)
Chloride: 104 mmol/L (ref 98–111)
Creatinine, Ser: 0.64 mg/dL (ref 0.44–1.00)
GFR, Estimated: >60 mL/min (ref >60)
Glucose, Bld: 96 mg/dL (ref 70–99)
Potassium: 3.2 mmol/L — ABNORMAL LOW (ref 3.5–5.1)
Sodium: 138 mmol/L (ref 135–145)

## 2024-02-27 NOTE — Telephone Encounter (Addendum)
 Called to confirm/remind patient of their procedure.   Scheduled for: R/L heart cath  [x]  Date 03/02/24  [x]  Time 09:30 am [x]  Arrival time 08:30 am [x]  Location [facility]   [x]  Designated Driver  [x]  Instructions, time, and location reviewed with patient    [x]  H&P within 30 days  [x]  EKG within 30 days  [x]  Orders  [x]  Labs PENDING  [x]  GFR PENDING  [x]  Hydration orders reviewed  [x]  Diet  [x]  Medication instructions Reviewed   [x]  Spoke with patient and reviewed all information []  VM Full/unable to leave message  []  Phone not in service

## 2024-02-28 ENCOUNTER — Ambulatory Visit: Payer: Self-pay | Admitting: Cardiovascular Disease

## 2024-02-28 MED ORDER — POTASSIUM CHLORIDE CRYS ER 20 MEQ PO TBCR: 20.0000 meq | EXTENDED_RELEASE_TABLET | Freq: Every day | ORAL | 0 refills | Status: AC

## 2024-03-02 ENCOUNTER — Encounter
Admission: RE | Disposition: A | Discharge status: Home / Self Care | Payer: Self-pay | Source: Home / Self Care | Attending: Cardiovascular Disease

## 2024-03-02 ENCOUNTER — Encounter: Payer: Self-pay | Admitting: Cardiovascular Disease

## 2024-03-02 ENCOUNTER — Other Ambulatory Visit: Payer: Self-pay

## 2024-03-02 ENCOUNTER — Observation Stay
Admission: RE | Admit: 2024-03-02 | Discharge: 2024-03-03 | Disposition: A | Discharge status: Home / Self Care | Attending: Cardiovascular Disease | Admitting: Cardiovascular Disease

## 2024-03-02 DIAGNOSIS — Z7982 Long term (current) use of aspirin: Secondary | ICD-10-CM | POA: Insufficient documentation

## 2024-03-02 DIAGNOSIS — R0789 Other chest pain: Principal | ICD-10-CM | POA: Insufficient documentation

## 2024-03-02 DIAGNOSIS — Z6841 Body Mass Index (BMI) 40.0 and over, adult: Secondary | ICD-10-CM | POA: Insufficient documentation

## 2024-03-02 DIAGNOSIS — I25118 Atherosclerotic heart disease of native coronary artery with other forms of angina pectoris: Secondary | ICD-10-CM | POA: Diagnosis not present

## 2024-03-02 DIAGNOSIS — I5032 Chronic diastolic (congestive) heart failure: Secondary | ICD-10-CM | POA: Diagnosis not present

## 2024-03-02 DIAGNOSIS — I25110 Atherosclerotic heart disease of native coronary artery with unstable angina pectoris: Principal | ICD-10-CM | POA: Insufficient documentation

## 2024-03-02 DIAGNOSIS — R079 Chest pain, unspecified: Principal | ICD-10-CM | POA: Diagnosis present

## 2024-03-02 DIAGNOSIS — G4733 Obstructive sleep apnea (adult) (pediatric): Secondary | ICD-10-CM | POA: Diagnosis not present

## 2024-03-02 DIAGNOSIS — K219 Gastro-esophageal reflux disease without esophagitis: Secondary | ICD-10-CM | POA: Diagnosis not present

## 2024-03-02 DIAGNOSIS — E669 Obesity, unspecified: Secondary | ICD-10-CM | POA: Diagnosis not present

## 2024-03-02 DIAGNOSIS — I200 Unstable angina: Secondary | ICD-10-CM

## 2024-03-02 DIAGNOSIS — Z79899 Other long term (current) drug therapy: Secondary | ICD-10-CM | POA: Diagnosis not present

## 2024-03-02 DIAGNOSIS — I272 Pulmonary hypertension, unspecified: Secondary | ICD-10-CM | POA: Insufficient documentation

## 2024-03-02 DIAGNOSIS — I2510 Atherosclerotic heart disease of native coronary artery without angina pectoris: Secondary | ICD-10-CM

## 2024-03-02 DIAGNOSIS — I110 Hypertensive heart disease with heart failure: Secondary | ICD-10-CM | POA: Diagnosis not present

## 2024-03-02 HISTORY — PX: RIGHT/LEFT HEART CATH AND CORONARY ANGIOGRAPHY: CATH118266

## 2024-03-02 HISTORY — PX: CORONARY PRESSURE/FFR STUDY: CATH118243

## 2024-03-02 LAB — POCT I-STAT EG7
Acid-base deficit: 4 mmol/L — ABNORMAL HIGH (ref 0.0–2.0)
Bicarbonate: 20.6 mmol/L (ref 20.0–28.0)
Calcium, Ion: 1.31 mmol/L (ref 1.15–1.40)
HCT: 40 % (ref 36.0–46.0)
Hemoglobin: 13.6 g/dL (ref 12.0–15.0)
O2 Saturation: 72 %
Potassium: 3.7 mmol/L (ref 3.5–5.1)
Sodium: 140 mmol/L (ref 135–145)
TCO2: 22 mmol/L (ref 22–32)
pCO2, Ven: 33.8 mmHg — ABNORMAL LOW (ref 44–60)
pH, Ven: 7.393 (ref 7.25–7.43)
pO2, Ven: 38 mmHg (ref 32–45)

## 2024-03-02 LAB — CBC
HCT: 38.4 % (ref 36.0–46.0)
Hemoglobin: 12.6 g/dL (ref 12.0–15.0)
MCH: 27.6 pg (ref 26.0–34.0)
MCHC: 32.8 g/dL (ref 30.0–36.0)
MCV: 84.2 fL (ref 80.0–100.0)
Platelets: 336 K/uL (ref 150–400)
RBC: 4.56 MIL/uL (ref 3.87–5.11)
RDW: 13.7 % (ref 11.5–15.5)
WBC: 11.9 K/uL — ABNORMAL HIGH (ref 4.0–10.5)
nRBC: 0 % (ref 0.0–0.2)

## 2024-03-02 LAB — POCT I-STAT 7, (LYTES, BLD GAS, ICA,H+H)
Acid-base deficit: 4 mmol/L — ABNORMAL HIGH (ref 0.0–2.0)
Bicarbonate: 19.3 mmol/L — ABNORMAL LOW (ref 20.0–28.0)
Calcium, Ion: 1.28 mmol/L (ref 1.15–1.40)
HCT: 39 % (ref 36.0–46.0)
Hemoglobin: 13.3 g/dL (ref 12.0–15.0)
O2 Saturation: 94 %
Potassium: 3.7 mmol/L (ref 3.5–5.1)
Sodium: 140 mmol/L (ref 135–145)
TCO2: 20 mmol/L — ABNORMAL LOW (ref 22–32)
pCO2 arterial: 30.5 mmHg — ABNORMAL LOW (ref 32–48)
pH, Arterial: 7.41 (ref 7.35–7.45)
pO2, Arterial: 67 mmHg — ABNORMAL LOW (ref 83–108)

## 2024-03-02 LAB — D-DIMER, QUANTITATIVE: D-Dimer, Quant: 0.29 ug{FEU}/mL (ref 0.00–0.50)

## 2024-03-02 LAB — CREATININE, SERUM
Creatinine, Ser: 0.86 mg/dL (ref 0.44–1.00)
GFR, Estimated: >60 mL/min (ref >60)

## 2024-03-02 LAB — TROPONIN I (HIGH SENSITIVITY)
Troponin I (High Sensitivity): 12 ng/L (ref <18)
Troponin I (High Sensitivity): 15 ng/L (ref <18)

## 2024-03-02 LAB — POCT ACTIVATED CLOTTING TIME: Activated Clotting Time: 302 s

## 2024-03-02 SURGERY — RIGHT/LEFT HEART CATH AND CORONARY ANGIOGRAPHY
Anesthesia: Moderate Sedation

## 2024-03-02 MED ORDER — HEPARIN (PORCINE) IN NACL 1000-0.9 UT/500ML-% IV SOLN
INTRAVENOUS | Status: DC | PRN
  Administered 2024-03-02 (×2): 1000 mL

## 2024-03-02 MED ORDER — ACETAMINOPHEN 325 MG PO TABS
ORAL_TABLET | ORAL | Status: AC
  Administered 2024-03-02 (×2): 650 mg via ORAL
  Filled 2024-03-02: qty 2

## 2024-03-02 MED ORDER — LABETALOL HCL 5 MG/ML IV SOLN
INTRAVENOUS | Status: AC
  Administered 2024-03-02 (×2): 10 mg via INTRAVENOUS
  Filled 2024-03-02: qty 4

## 2024-03-02 MED ORDER — SODIUM CHLORIDE 0.9% FLUSH: 3.0000 mL | INTRAVENOUS | Status: DC | PRN

## 2024-03-02 MED ORDER — FREE WATER
500.0000 mL | Freq: Once | Status: AC
  Administered 2024-03-02 (×2): 500 mL via ORAL

## 2024-03-02 MED ORDER — FENTANYL CITRATE (PF) 100 MCG/2ML IJ SOLN
INTRAMUSCULAR | Status: AC
  Filled 2024-03-02: qty 2

## 2024-03-02 MED ORDER — IRBESARTAN 150 MG PO TABS
150.0000 mg | ORAL_TABLET | Freq: Every day | ORAL | Status: DC
  Administered 2024-03-02 – 2024-03-03 (×4): 150 mg via ORAL
  Filled 2024-03-02 (×2): qty 1

## 2024-03-02 MED ORDER — LIDOCAINE HCL 1 % IJ SOLN
INTRAMUSCULAR | Status: AC
  Filled 2024-03-02: qty 20

## 2024-03-02 MED ORDER — FENTANYL CITRATE (PF) 100 MCG/2ML IJ SOLN
50.0000 ug | Freq: Once | INTRAMUSCULAR | Status: AC
  Administered 2024-03-02 (×2): 50 ug via INTRAVENOUS

## 2024-03-02 MED ORDER — HEPARIN SODIUM (PORCINE) 1000 UNIT/ML IJ SOLN
INTRAMUSCULAR | Status: DC | PRN
  Administered 2024-03-02 (×4): 5000 [IU] via INTRAVENOUS

## 2024-03-02 MED ORDER — MIDAZOLAM HCL 2 MG/2ML IJ SOLN
INTRAMUSCULAR | Status: DC | PRN
  Administered 2024-03-02: 1 mg via INTRAVENOUS
  Administered 2024-03-02: 2 mg via INTRAVENOUS
  Administered 2024-03-02: 1 mg via INTRAVENOUS
  Administered 2024-03-02: 2 mg via INTRAVENOUS

## 2024-03-02 MED ORDER — ALUM & MAG HYDROXIDE-SIMETH 200-200-20 MG/5ML PO SUSP
15.0000 mL | ORAL | Status: DC | PRN
  Administered 2024-03-02 (×2): 15 mL via ORAL
  Filled 2024-03-02: qty 30

## 2024-03-02 MED ORDER — LABETALOL HCL 5 MG/ML IV SOLN
10.0000 mg | INTRAVENOUS | Status: AC | PRN
  Administered 2024-03-02 (×2): 10 mg via INTRAVENOUS

## 2024-03-02 MED ORDER — SODIUM CHLORIDE 0.9 % IV SOLN: 250.0000 mL | INTRAVENOUS | Status: DC | PRN

## 2024-03-02 MED ORDER — MIDAZOLAM HCL 2 MG/2ML IJ SOLN
INTRAMUSCULAR | Status: AC
  Filled 2024-03-02: qty 2

## 2024-03-02 MED ORDER — ASPIRIN 81 MG PO TBEC
81.0000 mg | DELAYED_RELEASE_TABLET | Freq: Every day | ORAL | Status: DC
  Administered 2024-03-03 (×2): 81 mg via ORAL
  Filled 2024-03-02: qty 1

## 2024-03-02 MED ORDER — ACETAMINOPHEN 325 MG PO TABS
650.0000 mg | ORAL_TABLET | ORAL | Status: DC | PRN
Start: 2024-03-02 — End: 2024-03-03
  Administered 2024-03-02 – 2024-03-03 (×6): 650 mg via ORAL
  Filled 2024-03-02 (×3): qty 2

## 2024-03-02 MED ORDER — ENOXAPARIN SODIUM 40 MG/0.4ML IJ SOSY
40.0000 mg | PREFILLED_SYRINGE | INTRAMUSCULAR | Status: DC
  Filled 2024-03-02: qty 0.4

## 2024-03-02 MED ORDER — ONDANSETRON HCL 4 MG/2ML IJ SOLN: 4.0000 mg | Freq: Four times a day (QID) | INTRAMUSCULAR | Status: DC | PRN

## 2024-03-02 MED ORDER — HEPARIN (PORCINE) IN NACL 1000-0.9 UT/500ML-% IV SOLN
INTRAVENOUS | Status: AC
  Filled 2024-03-02: qty 1000

## 2024-03-02 MED ORDER — SODIUM CHLORIDE 0.9% FLUSH
3.0000 mL | Freq: Two times a day (BID) | INTRAVENOUS | Status: DC
  Administered 2024-03-02 – 2024-03-03 (×4): 3 mL via INTRAVENOUS

## 2024-03-02 MED ORDER — HEPARIN SODIUM (PORCINE) 1000 UNIT/ML IJ SOLN
INTRAMUSCULAR | Status: AC
  Filled 2024-03-02: qty 10

## 2024-03-02 MED ORDER — IOHEXOL 300 MG/ML  SOLN
INTRAMUSCULAR | Status: DC | PRN
  Administered 2024-03-02 (×2): 150 mL

## 2024-03-02 MED ORDER — ASPIRIN 81 MG PO CHEW
81.0000 mg | CHEWABLE_TABLET | ORAL | Status: AC
  Administered 2024-03-02 (×2): 81 mg via ORAL

## 2024-03-02 MED ORDER — ADENOSINE 90 MG IN NORMAL SALINE 60 ML (1 MG/ML) FOR CATH LAB
INTRACORONARY | Status: DC | PRN
  Administered 2024-03-02 (×2): 140 ug/kg/min via INTRACORONARY

## 2024-03-02 MED ORDER — FENTANYL CITRATE (PF) 100 MCG/2ML IJ SOLN
INTRAMUSCULAR | Status: DC | PRN
  Administered 2024-03-02 (×6): 25 ug via INTRAVENOUS

## 2024-03-02 MED ORDER — ADENOSINE 6 MG/2ML IV SOLN
INTRAVENOUS | Status: AC
  Filled 2024-03-02: qty 2

## 2024-03-02 MED ORDER — SODIUM CHLORIDE 0.9% FLUSH
3.0000 mL | Freq: Two times a day (BID) | INTRAVENOUS | Status: DC
  Administered 2024-03-02 (×2): 3 mL via INTRAVENOUS

## 2024-03-02 MED ORDER — VERAPAMIL HCL 2.5 MG/ML IV SOLN
INTRAVENOUS | Status: AC
  Filled 2024-03-02: qty 2

## 2024-03-02 MED ORDER — ADENOSINE (DIAGNOSTIC) 3 MG/ML IV SOLN
INTRAVENOUS | Status: AC
  Filled 2024-03-02: qty 30

## 2024-03-02 MED ORDER — NITROGLYCERIN 1 MG/10 ML FOR IR/CATH LAB
INTRA_ARTERIAL | Status: DC | PRN
  Administered 2024-03-02 (×2): 200 ug via INTRA_ARTERIAL
  Administered 2024-03-02 (×2): 200 ug via INTRACORONARY

## 2024-03-02 MED ORDER — CARVEDILOL 6.25 MG PO TABS
6.2500 mg | ORAL_TABLET | Freq: Two times a day (BID) | ORAL | Status: DC
  Administered 2024-03-02 – 2024-03-03 (×4): 6.25 mg via ORAL
  Filled 2024-03-02 (×2): qty 1

## 2024-03-02 MED ORDER — PANTOPRAZOLE SODIUM 40 MG PO TBEC
40.0000 mg | DELAYED_RELEASE_TABLET | Freq: Two times a day (BID) | ORAL | Status: DC
  Administered 2024-03-02 – 2024-03-03 (×4): 40 mg via ORAL
  Filled 2024-03-02 (×2): qty 1

## 2024-03-02 MED ORDER — ASPIRIN 81 MG PO CHEW
CHEWABLE_TABLET | ORAL | Status: AC
  Filled 2024-03-02: qty 1

## 2024-03-02 MED ORDER — NITROGLYCERIN 0.4 MG SL SUBL: 0.4000 mg | SUBLINGUAL_TABLET | SUBLINGUAL | Status: DC | PRN

## 2024-03-02 MED ORDER — LIDOCAINE HCL (PF) 1 % IJ SOLN
INTRAMUSCULAR | Status: DC | PRN
  Administered 2024-03-02 (×4): 2 mL

## 2024-03-02 SURGICAL SUPPLY — 14 items
CATH BALLN WEDGE 5F 110CM (CATHETERS) IMPLANT
CATH INFINITI AMBI 5FR JK (CATHETERS) IMPLANT
CATH LAUNCHER 6FR EBU3.5 (CATHETERS) IMPLANT
DEVICE RAD TR BAND REGULAR (VASCULAR PRODUCTS) IMPLANT
DRAPE BRACHIAL (DRAPES) IMPLANT
GLIDESHEATH SLEND SS 6F .021 (SHEATH) IMPLANT
GUIDEWIRE INQWIRE 1.5J.035X260 (WIRE) IMPLANT
GUIDEWIRE PRESSURE X 175 (WIRE) IMPLANT
KIT ESSENTIALS PG (KITS) IMPLANT
PACK CARDIAC CATH (CUSTOM PROCEDURE TRAY) ×2 IMPLANT
SET ATX-X65L (MISCELLANEOUS) IMPLANT
SHEATH GLIDE SLENDER 4/5FR (SHEATH) IMPLANT
STATION PROTECTION PRESSURIZED (MISCELLANEOUS) IMPLANT
TUBING CIL FLEX 10 FLL-RA (TUBING) IMPLANT

## 2024-03-02 NOTE — Progress Notes (Signed)
 1435-Dr. Darron came to bedside to assess pt since she had continued chest pain. He had a conversation about possible admission. MD wanted labetalol  given and will reassess in 1 hour to see if admission is needed. Pt understanding and comfortable with plan.

## 2024-03-02 NOTE — Interval H&P Note (Signed)
 History and Physical Interval Note:  03/02/2024 10:01 AM  Marie Vaughan  has presented today for surgery, with the diagnosis of R and L Cath w possible PCI    Unstable angina  Pulmonary hypertension.  The various methods of treatment have been discussed with the patient and family. After consideration of risks, benefits and other options for treatment, the patient has consented to  Procedure(s): RIGHT/LEFT HEART CATH AND CORONARY ANGIOGRAPHY (Bilateral) as a surgical intervention.  The patient's history has been reviewed, patient examined, no change in status, stable for surgery.  I have reviewed the patient's chart and labs.  Questions were answered to the patient's satisfaction.     Brynn Reznik

## 2024-03-02 NOTE — Progress Notes (Signed)
 The patient underwent a right and left cardiac catheterization earlier.  Right heart catheterization showed high normal filling pressures with no pulmonary hypertension.  Left heart catheterization showed mild to moderate nonobstructive disease.  Testing for coronary microvascular disease was negative. The patient required large doses of fentanyl  and Versed  for for sedation. She started having chest pain with adenosine  infusion and this persisted after cardiac catheterization.  In addition, her blood pressure was uncontrolled.  She was given 1 dose of IV fentanyl  but with little relief.  Tylenol  did not help much. EKG showed no ischemic changes.  Given persistent chest pain, will observe the patient overnight and obtain serial troponin and will also check D-dimer given that she is mildly tachycardic.  Will work on blood pressure control and resume her carvedilol . She reports some symptoms suggestive of GERD and will treat empirically.

## 2024-03-02 NOTE — Plan of Care (Signed)
   Problem: Education: Goal: Understanding of CV disease, CV risk reduction, and recovery process will improve Outcome: Progressing Goal: Individualized Educational Video(s) Outcome: Progressing   Problem: Activity: Goal: Ability to return to baseline activity level will improve Outcome: Progressing   Problem: Cardiovascular: Goal: Ability to achieve and maintain adequate cardiovascular perfusion will improve Outcome: Progressing Goal: Vascular access site(s) Level 0-1 will be maintained Outcome: Progressing   Problem: Health Behavior/Discharge Planning: Goal: Ability to safely manage health-related needs after discharge will improve Outcome: Progressing   Problem: Education: Goal: Understanding of cardiac disease, CV risk reduction, and recovery process will improve Outcome: Progressing Goal: Individualized Educational Video(s) Outcome: Progressing   Problem: Activity: Goal: Ability to tolerate increased activity will improve Outcome: Progressing   Problem: Cardiac: Goal: Ability to achieve and maintain adequate cardiovascular perfusion will improve Outcome: Progressing   Problem: Health Behavior/Discharge Planning: Goal: Ability to safely manage health-related needs after discharge will improve Outcome: Progressing   Problem: Education: Goal: Knowledge of General Education information will improve Description: Including pain rating scale, medication(s)/side effects and non-pharmacologic comfort measures Outcome: Progressing   Problem: Health Behavior/Discharge Planning: Goal: Ability to manage health-related needs will improve Outcome: Progressing   Problem: Clinical Measurements: Goal: Ability to maintain clinical measurements within normal limits will improve Outcome: Progressing Goal: Will remain free from infection Outcome: Progressing Goal: Diagnostic test results will improve Outcome: Progressing Goal: Respiratory complications will improve Outcome:  Progressing Goal: Cardiovascular complication will be avoided Outcome: Progressing   Problem: Activity: Goal: Risk for activity intolerance will decrease Outcome: Progressing   Problem: Nutrition: Goal: Adequate nutrition will be maintained Outcome: Progressing   Problem: Coping: Goal: Level of anxiety will decrease Outcome: Progressing   Problem: Elimination: Goal: Will not experience complications related to bowel motility Outcome: Progressing Goal: Will not experience complications related to urinary retention Outcome: Progressing   Problem: Pain Managment: Goal: General experience of comfort will improve and/or be controlled Outcome: Progressing   Problem: Safety: Goal: Ability to remain free from injury will improve Outcome: Progressing   Problem: Skin Integrity: Goal: Risk for impaired skin integrity will decrease Outcome: Progressing

## 2024-03-03 ENCOUNTER — Encounter: Payer: Self-pay | Admitting: Cardiovascular Disease

## 2024-03-03 ENCOUNTER — Encounter: Payer: Self-pay | Admitting: Physician Assistant

## 2024-03-03 DIAGNOSIS — I200 Unstable angina: Secondary | ICD-10-CM | POA: Diagnosis not present

## 2024-03-03 DIAGNOSIS — R079 Chest pain, unspecified: Secondary | ICD-10-CM | POA: Diagnosis not present

## 2024-03-03 DIAGNOSIS — I25110 Atherosclerotic heart disease of native coronary artery with unstable angina pectoris: Secondary | ICD-10-CM | POA: Diagnosis not present

## 2024-03-03 MED ORDER — CARVEDILOL 6.25 MG PO TABS: 6.2500 mg | ORAL_TABLET | Freq: Two times a day (BID) | ORAL | 3 refills | Status: DC

## 2024-03-03 NOTE — Plan of Care (Signed)
  Problem: Education: Goal: Understanding of CV disease, CV risk reduction, and recovery process will improve Outcome: Progressing   Problem: Activity: Goal: Ability to return to baseline activity level will improve Outcome: Progressing   Problem: Health Behavior/Discharge Planning: Goal: Ability to safely manage health-related needs after discharge will improve Outcome: Progressing   Problem: Activity: Goal: Ability to tolerate increased activity will improve Outcome: Progressing   Problem: Health Behavior/Discharge Planning: Goal: Ability to safely manage health-related needs after discharge will improve Outcome: Progressing

## 2024-03-03 NOTE — Plan of Care (Signed)
 Problem: Education: Goal: Understanding of CV disease, CV risk reduction, and recovery process will improve 03/03/2024 0801 by Arloa Dene KIDD, RN Outcome: Adequate for Discharge 03/02/2024 1825 by Arloa Dene KIDD, RN Outcome: Progressing Goal: Individualized Educational Video(s) 03/03/2024 0801 by Arloa Dene KIDD, RN Outcome: Adequate for Discharge 03/02/2024 1825 by Arloa Dene KIDD, RN Outcome: Progressing   Problem: Activity: Goal: Ability to return to baseline activity level will improve 03/03/2024 0801 by Arloa Dene KIDD, RN Outcome: Adequate for Discharge 03/02/2024 1825 by Arloa Dene KIDD, RN Outcome: Progressing   Problem: Cardiovascular: Goal: Ability to achieve and maintain adequate cardiovascular perfusion will improve 03/03/2024 0801 by Arloa Dene KIDD, RN Outcome: Adequate for Discharge 03/02/2024 1825 by Arloa Dene KIDD, RN Outcome: Progressing Goal: Vascular access site(s) Level 0-1 will be maintained 03/03/2024 0801 by Arloa Dene KIDD, RN Outcome: Adequate for Discharge 03/02/2024 1825 by Arloa Dene KIDD, RN Outcome: Progressing   Problem: Health Behavior/Discharge Planning: Goal: Ability to safely manage health-related needs after discharge will improve 03/03/2024 0801 by Arloa Dene KIDD, RN Outcome: Adequate for Discharge 03/02/2024 1825 by Arloa Dene KIDD, RN Outcome: Progressing   Problem: Education: Goal: Understanding of cardiac disease, CV risk reduction, and recovery process will improve 03/03/2024 0801 by Arloa Dene KIDD, RN Outcome: Adequate for Discharge 03/02/2024 1825 by Arloa Dene KIDD, RN Outcome: Progressing Goal: Individualized Educational Video(s) 03/03/2024 0801 by Arloa Dene KIDD, RN Outcome: Adequate for Discharge 03/02/2024 1825 by Arloa Dene KIDD, RN Outcome: Progressing   Problem: Activity: Goal: Ability to tolerate increased activity will improve 03/03/2024 0801 by Arloa Dene KIDD,  RN Outcome: Adequate for Discharge 03/02/2024 1825 by Arloa Dene KIDD, RN Outcome: Progressing   Problem: Cardiac: Goal: Ability to achieve and maintain adequate cardiovascular perfusion will improve 03/03/2024 0801 by Arloa Dene KIDD, RN Outcome: Adequate for Discharge 03/02/2024 1825 by Arloa Dene KIDD, RN Outcome: Progressing   Problem: Health Behavior/Discharge Planning: Goal: Ability to safely manage health-related needs after discharge will improve 03/03/2024 0801 by Arloa Dene KIDD, RN Outcome: Adequate for Discharge 03/02/2024 1825 by Arloa Dene KIDD, RN Outcome: Progressing   Problem: Education: Goal: Knowledge of General Education information will improve Description: Including pain rating scale, medication(s)/side effects and non-pharmacologic comfort measures 03/03/2024 0801 by Arloa Dene KIDD, RN Outcome: Adequate for Discharge 03/02/2024 1825 by Arloa Dene KIDD, RN Outcome: Progressing   Problem: Health Behavior/Discharge Planning: Goal: Ability to manage health-related needs will improve 03/03/2024 0801 by Arloa Dene KIDD, RN Outcome: Adequate for Discharge 03/02/2024 1825 by Arloa Dene KIDD, RN Outcome: Progressing   Problem: Clinical Measurements: Goal: Ability to maintain clinical measurements within normal limits will improve 03/03/2024 0801 by Arloa Dene KIDD, RN Outcome: Adequate for Discharge 03/02/2024 1825 by Arloa Dene KIDD, RN Outcome: Progressing Goal: Will remain free from infection 03/03/2024 0801 by Arloa Dene KIDD, RN Outcome: Adequate for Discharge 03/02/2024 1825 by Arloa Dene KIDD, RN Outcome: Progressing Goal: Diagnostic test results will improve 03/03/2024 0801 by Arloa Dene KIDD, RN Outcome: Adequate for Discharge 03/02/2024 1825 by Arloa Dene KIDD, RN Outcome: Progressing Goal: Respiratory complications will improve 03/03/2024 0801 by Arloa Dene KIDD, RN Outcome: Adequate for Discharge 03/02/2024  1825 by Arloa Dene KIDD, RN Outcome: Progressing Goal: Cardiovascular complication will be avoided 03/03/2024 0801 by Arloa Dene KIDD, RN Outcome: Adequate for Discharge 03/02/2024 1825 by Arloa Dene KIDD, RN Outcome: Progressing   Problem: Activity: Goal: Risk for activity intolerance will decrease 03/03/2024 0801 by Arloa Dene KIDD, RN Outcome: Adequate for Discharge 03/02/2024  1825 by Arloa Dene KIDD, RN Outcome: Progressing   Problem: Nutrition: Goal: Adequate nutrition will be maintained 03/03/2024 0801 by Arloa Dene KIDD, RN Outcome: Adequate for Discharge 03/02/2024 1825 by Arloa Dene KIDD, RN Outcome: Progressing   Problem: Coping: Goal: Level of anxiety will decrease 03/03/2024 0801 by Arloa Dene KIDD, RN Outcome: Adequate for Discharge 03/02/2024 1825 by Arloa Dene KIDD, RN Outcome: Progressing   Problem: Elimination: Goal: Will not experience complications related to bowel motility 03/03/2024 0801 by Arloa Dene KIDD, RN Outcome: Adequate for Discharge 03/02/2024 1825 by Arloa Dene KIDD, RN Outcome: Progressing Goal: Will not experience complications related to urinary retention 03/03/2024 0801 by Arloa Dene KIDD, RN Outcome: Adequate for Discharge 03/02/2024 1825 by Arloa Dene KIDD, RN Outcome: Progressing   Problem: Pain Managment: Goal: General experience of comfort will improve and/or be controlled 03/03/2024 0801 by Arloa Dene KIDD, RN Outcome: Adequate for Discharge 03/02/2024 1825 by Arloa Dene KIDD, RN Outcome: Progressing   Problem: Safety: Goal: Ability to remain free from injury will improve 03/03/2024 0801 by Arloa Dene KIDD, RN Outcome: Adequate for Discharge 03/02/2024 1825 by Arloa Dene KIDD, RN Outcome: Progressing   Problem: Skin Integrity: Goal: Risk for impaired skin integrity will decrease 03/03/2024 0801 by Arloa Dene KIDD, RN Outcome: Adequate for Discharge 03/02/2024 1825 by Arloa Dene KIDD, RN Outcome: Progressing

## 2024-03-03 NOTE — Discharge Summary (Signed)
 Discharge Summary   Patient ID: Marie Vaughan MRN: [id number]; DOB: 01-16-1971  Admit date: 03/02/2024 Discharge date: 03/03/2024  PCP:  Cyrus Selinda Moose, PA-C   [facility] Providers Cardiologist:  Deatrice Cage, MD       Discharge Diagnoses  Principal Problem:   Chest pain Active Problems:   Coronary artery disease  Diagnostic Studies/Procedures   03/02/2024 R/LHC   Prox RCA to Mid RCA lesion is 10% stenosed.   1st Diag lesion is 40% stenosed.   Mid LAD lesion is 30% stenosed. 1.  Mild to moderate nonobstructive coronary artery disease. 2.  Left ventricular angiography was not performed.  EF was normal by noninvasive testing. 3.  Right heart catheterization showed mildly elevated RA pressure at 9, normal pulmonary pressure, high normal wedge pressure and normal cardiac output. 4.  Negative testing for coronary microvascular disease.  RFR across the lesion in the LAD was 0.94 with an FFR of 0.91.  CFR was 3.6 with an IMR of 14. 5.  The patient required large doses of sedation.  _____________   History of Present Illness   Marie Vaughan is a 53 y.o. female with a history of essential hypertension, GERD, OSA not on CPAP and obesity who presented for outpatient right and left heart catheterization with coronary microvascular dysfunction testing due to persistent chest pain and dyspnea on exertion.  Hospital Course   Patient presented for outpatient R/LHC on 03/02/2024.  Right heart catheterization showed high normal filling pressures with no pulmonary hypertension.  Left heart catheterization showed mild to moderate nonobstructive disease.  Testing for coronary microvascular disease was negative.  After adenosine  infusion, patient started experiencing chest pain which persisted after cardiac catheterization.  Her blood pressure was noted to be high.  She was given Tylenol  and IV fentanyl  with little relief.  Given persistent chest pain, she was admitted for observation  and testing.  Serial troponins were negative.  D-dimer was negative.  She had some symptoms which were suggestive of GERD and treated empirically with Protonix .  Carvedilol  was increased to 6.25 mg twice daily and she was resumed on ARB with stabilization of BP.  Chest pain resolved.    Did the patient have an acute coronary syndrome (MI, NSTEMI, STEMI, etc) this admission?:  No                               Did the patient have a percutaneous coronary intervention (stent / angioplasty)?:  No.   _____________  Discharge Vitals Blood pressure (!) 138/90, pulse 80, temperature 97.6 F (36.4 C), resp. rate 20, height 5' 1 (1.549 m), weight 97.5 kg, SpO2 99%.  Filed Weights   03/02/24 0911  Weight: 97.5 kg    Labs & Radiologic Studies  CBC Recent Labs    03/02/24 1029 03/02/24 1630  WBC  --  11.9*  HGB 13.3 12.6  HCT 39.0 38.4  MCV  --  84.2  PLT  --  336   Basic Metabolic Panel Recent Labs    91/88/74 1025 03/02/24 1029 03/02/24 1630  NA 140 140  --   K 3.7 3.7  --   CREATININE  --   --  0.86   Liver Function Tests No results for input(s): AST, ALT, ALKPHOS, BILITOT, PROT, ALBUMIN in the last 72 hours. No results for input(s): LIPASE, AMYLASE in the last 72 hours. High Sensitivity Troponin:   Recent Labs  Lab 03/02/24  1630 03/02/24 1931  TROPONINIHS 12 15    No results for input(s): TRNPT in the last 720 hours.  BNP Invalid input(s): POCBNP No results for input(s): PROBNP in the last 72 hours.  No results for input(s): BNP in the last 72 hours.  D-Dimer Recent Labs    03/02/24 1630  DDIMER 0.29   Hemoglobin A1C No results for input(s): HGBA1C in the last 72 hours. Fasting Lipid Panel No results for input(s): CHOL, HDL, LDLCALC, TRIG, CHOLHDL, LDLDIRECT in the last 72 hours. No results found for: LIPOA  Thyroid  Function Tests No results for input(s): TSH, T4TOTAL, T3FREE, THYROIDAB in the last 72  hours.  Invalid input(s): FREET3  Disposition Pt is being discharged home today in good condition. Cath site is stable without signs of bleeding or hematochezia.   Follow-up Plans & Appointments  Discharge Instructions     Call MD for:  difficulty breathing, headache or visual disturbances   Complete by: As directed    Call MD for:  hives   Complete by: As directed    Call MD for:  persistant nausea and vomiting   Complete by: As directed    Call MD for:  redness, tenderness, or signs of infection (pain, swelling, redness, odor or green/yellow discharge around incision site)   Complete by: As directed    Call MD for:  severe uncontrolled pain   Complete by: As directed    Call MD for:  temperature >100.4   Complete by: As directed    Diet - low sodium heart healthy   Complete by: As directed    Discharge instructions   Complete by: As directed    No lifting greater than 5 lbs for the next 3-5 days. No pushing or pulling with cath site arm for 3-5 days. You may return to work on 03/05/2024. Keep procedure site clean & dry. If you notice increased pain, swelling, bleeding or pus, call/return!  You may shower, but no soaking baths/hot tubs/pools for 1 week.   Increase activity slowly   Complete by: As directed      Follow up with Medford Meager 03/19/2024 at 8:25 AM  Discharge Medications Allergies as of 03/03/2024       Reactions   Iodinated Contrast Media Anxiety   Other Reaction(s): itching and couldn't breathe 3/09 at [facility] with CT   Chocolate Other (See Comments)   Hives, itching [employer]   Chocolate Flavoring Agent (non-screening) Other (See Comments)   Hives, itching   Flavoring Agent Other (See Comments)   Peanut-containing Drug Products Swelling   cashews   Pineapple Itching, Swelling        Medication List     STOP taking these medications    isosorbide  mononitrate 30 MG 24 hr tablet Commonly known as: IMDUR    predniSONE  50 MG tablet Commonly known as:  DELTASONE        TAKE these medications    acetaminophen  500 MG tablet Commonly known as: TYLENOL  Take 500-1,000 mg by mouth every 6 (six) hours as needed (pain).   albuterol  108 (90 Base) MCG/ACT inhaler Commonly known as: VENTOLIN  HFA Inhale 1-2 puffs into the lungs every 4 (four) hours as needed.   aspirin  EC 81 MG tablet Take 1 tablet (81 mg total) by mouth daily.   carvedilol  6.25 MG tablet Commonly known as: COREG  Take 1 tablet (6.25 mg total) by mouth 2 (two) times daily with a meal.   diclofenac  sodium 1 % Gel Commonly known as: VOLTAREN  Apply 4  g topically 4 (four) times daily. What changed:  when to take this reasons to take this   hyoscyamine 0.125 MG Tbdp disintergrating tablet Commonly known as: ANASPAZ Place 0.125 mg under the tongue daily as needed for cramping (Gi).   omeprazole  40 MG capsule Commonly known as: PRILOSEC Take 40 mg by mouth daily as needed (Acid reflux).   potassium chloride  SA 20 MEQ tablet Commonly known as: KLOR-CON  M Take 1 tablet (20 mEq total) by mouth daily.   rosuvastatin  5 MG tablet Commonly known as: CRESTOR  Take 1 tablet (5 mg total) by mouth daily.   simethicone  125 MG chewable tablet Commonly known as: MYLICON Chew 125 mg by mouth every 6 (six) hours as needed for flatulence.   Ubrelvy  50 MG Tabs Generic drug: Ubrogepant  Take 1-2 tablets by mouth daily as needed.   valsartan -hydrochlorothiazide  160-25 MG tablet Commonly known as: DIOVAN -HCT TAKE 1 TABLET BY MOUTH DAILY         Duration of Discharge Encounter: APP Time: 30 minutes   Signed, Lesley LITTIE Maffucci, PA-C 03/03/2024, 9:54 AM

## 2024-03-03 NOTE — TOC CM/SW Note (Signed)
 Transition of Care [facility]) - Inpatient Brief Assessment   Patient Details  Name: Marie Vaughan MRN: [id number] Date of Birth: 1971/04/20  Transition of Care [facility]) CM/SW Contact:    Lauraine JAYSON Carpen, LCSW Phone Number: 03/03/2024, 10:10 AM   Clinical Narrative: Patient has orders to discharge home today. Chart reviewed. No TOC needs identified. CSW signing off.  Transition of Care Asessment: Insurance and Status: Insurance coverage has been reviewed Patient has primary care physician: Yes Home environment has been reviewed: Not documented Prior level of function:: Not documented Prior/Current Home Services: No current home services Social Drivers of Health Review: SDOH reviewed no interventions necessary Readmission risk has been reviewed: Yes Transition of care needs: no transition of care needs at this time

## 2024-03-18 NOTE — Progress Notes (Unsigned)
 Cardiology Office Note    Date:  03/19/2024   ID:  Marie Vaughan, Marie Vaughan May 13, 1971, MRN [id number]  PCP:  Cyrus Selinda Moose, PA-C  Cardiologist:  Deatrice Cage, MD  Electrophysiologist:  None   Chief Complaint: Follow-up  History of Present Illness:   Marie Vaughan is a 53 y.o. female with history of hypertension, GERD, sleep apnea, diastolic dysfunction, and obesity who presents for follow-up after right and left heart catheterization.    Patient was previously evaluated in 2015 in the setting of chest pain and underwent stress testing, which was low risk.  Diagnostic catheterization was performed in the setting of ongoing symptoms in 04/2014 which showed normal cNegative testing for coronary microvascular disease.  RFR across the lesion in the LAD was 0.94 with an FFR of 0.91.  CFR was 3.6 with an IMR of 14.oronary arteries and normal LV function.  Echo in 2017 showed normal LV function without any significant valve disease.  In 12/2020, she again reported dyspnea with minimal activity and intermittent rest/exertional chest pain.  Coronary CTA was performed showing coronary calcium  score 21.8 (92nd percentile), and minimal (1 to 24%) LAD disease.  The main pulmonary artery was dilated to 28 mm.  Aggressive risk factor modification was recommended.  At follow-up visit 01/2021, she continued to report significant exertional dyspnea and intermittent chest pain.  She was scheduled for right and left heart catheterization but initially canceled.  A follow-up in 03/2021, she reported ongoing symptoms of dyspnea on exertion and intermittent pleuritic type chest discomfort.  Right heart catheter was performed 04/2021 showing high normal filling pressures with minimal pulmonary hypertension and normal cardiac output.  She was seen in follow-up 10/2023 and reported ongoing symptoms of intermittent resting exertional chest pain.  She reported that symptoms typically resolve spontaneously or after taking  Tylenol  or ibuprofen .  She was started on Imdur  and further testing was deferred.  She did not tolerate Imdur  due to hypotension.  It also did not help her chest discomfort.   Patient was seen by Dr. Cage 02/19/2024 with ongoing and worsening symptoms of intermittent chest tightness and shortness of breath with minimal exertion.  Right and left cardiac catheterization was recommended and completed 03/02/2024 revealing high normal filling pressures with no pulmonary hypertension and mild to moderate nonobstructive disease with normal microvascular function.  Patient required large doses of fentanyl  and Versed  for sedation.  She began experiencing chest pain with adenosine  infusion which persisted after procedure.  Her blood pressure was also uncontrolled and she was observed overnight.  Troponin and D-dimer were negative.  She had some symptoms suggestive of GERD and was treated empirically with Protonix .  Carvedilol  was increased and she was resumed on PTA ARB with stabilization of BP and resolution of chest pain. She was discharged 03/03/2024 in stable condition.   Since her catheterization, she has continued to experience dyspnea with relatively minimal activity.  She notes that just walking to her car after work usually causes her to become quite dyspneic and she needs to sit in her car for several minutes before becoming comfortable.  Dyspnea sometimes associated with lightheadedness.  She also continues to experience intermittent chest pain that can occur at rest while sitting, lasts a few minutes, resolved spontaneously.  She denies palpitations, PND, orthopnea, syncope, edema, or early satiety.  She does plan to follow-up with pulmonology and recognizes that she needs to increase her activity and lose weight.  Her right radial and brachial catheterization sites have healed  well.  Labs independently reviewed: 03/02/2024-Hgb 12.6, HCT 38.4, platelets 336, creatinine 0.86, troponin/D-dimer  negative 06/2021-TC 177, TG 119, HDL 44, LDL 109  Objective   Past Medical History:  Diagnosis Date   Acne    Acute bronchitis    Allergy    Angio-edema    Anxiety    Anxiety state, unspecified    CAD (non-obstructive)    a. 02/2024 Cath: LM nl, LAD 3m (RFR 0.94, FFR 0.91, CFR 3.6 w/ IMR of 14), D1 40, LCX nl, RCA 10p/m.  No evidence of microvascular dzs. Nl R heart pressures.   Cervicalgia    Chronic dyspnea    a. 04/2021 RHC: RA 12/9, RV 31/7 (13), PA 29/16 (22), PCWP 12, CO 5.16 L/min; CI 2.58 L/min/m; b. 02/2024 R/LHC: RA 9, PA 23/11 (16), PCWP 12. Mild nonobs CAD w/ nl perfusion.   Contact lens/glasses fitting    wears contacts or glasses   Diastolic dysfunction    a. 11/2015 Echo: EF 55-60%, no rwma; b. 01/2021 Echo: EF 55-60%, no rwma, GrI DD, nl RV size/fxn.   Dysmenorrhea    GERD (gastroesophageal reflux disease)    History of stress test    a. Lexiscan 08/25/13: no sig ischemia, attenuation artifact noted, no sig WMA noted, EF 69%, no EKG changes concerning for ischemia   HTN (hypertension)    Iron deficiency anemia secondary to blood loss (chronic)    Migraine with aura, without mention of intractable migraine without mention of status migrainosus    vestibular per pt   Non-obstructive CAD (coronary artery disease)    a. 04/29/2014: LM nl, LAD no dz, D1 no dz, D2 very small vessel, D3 very small vessel, LCx nl, OM1 very small vessel, OM2 medium sized vessel, OM 3 no dz, RCA no dz, PDA no dzs; b. 12/2020 Cor CTA: Cor CA2+ = 21.8 (92nd%'ile), LM nl, LAD 1-24%p, D1 nl, LCX nl, RCA nl. Ao nl size w/o Ca2+, main PA dilated @ 28mm (? PAH).   Obesity    Other disorders of bone and cartilage(733.99)    Pain in thoracic spine    Pharyngitis    Tear of medial meniscus of left knee    Tear of medial meniscus of right knee    Vaginal delivery    x 3   Wears contact lenses     Current Medications: Current Meds  Medication Sig   acetaminophen  (TYLENOL ) 500 MG tablet Take  500-1,000 mg by mouth every 6 (six) hours as needed (pain).   albuterol  (VENTOLIN  HFA) 108 (90 Base) MCG/ACT inhaler Inhale 1-2 puffs into the lungs every 4 (four) hours as needed.   aspirin  EC 81 MG tablet Take 1 tablet (81 mg total) by mouth daily.   carvedilol  (COREG ) 6.25 MG tablet Take 1 tablet (6.25 mg total) by mouth 2 (two) times daily with a meal.   diclofenac  sodium (VOLTAREN ) 1 % GEL Apply 4 g topically 4 (four) times daily.   hyoscyamine (ANASPAZ) 0.125 MG TBDP disintergrating tablet Place 0.125 mg under the tongue daily as needed for cramping (Gi).   omeprazole  (PRILOSEC) 40 MG capsule Take 40 mg by mouth daily as needed (Acid reflux).   potassium chloride  SA (KLOR-CON  M) 20 MEQ tablet Take 1 tablet (20 mEq total) by mouth daily.   rosuvastatin  (CRESTOR ) 5 MG tablet Take 1 tablet (5 mg total) by mouth daily.   simethicone  (MYLICON) 125 MG chewable tablet Chew 125 mg by mouth every 6 (six) hours as needed for  flatulence.   Ubrogepant  (UBRELVY ) 50 MG TABS Take 1-2 tablets by mouth daily as needed.   valsartan -hydrochlorothiazide  (DIOVAN -HCT) 160-25 MG tablet TAKE 1 TABLET BY MOUTH DAILY    Allergies:   Iodinated contrast media, Chocolate, Chocolate flavoring agent (non-screening), Flavoring agent, Peanut-containing drug products, and Pineapple   Social History   Socioeconomic History   Marital status: Married    Spouse name: Darin   Number of children: 3   Years of education: Not on file   Highest education level: Not on file  Occupational History   Occupation: Education officer, community: [employer]  Tobacco Use   Smoking status: Never   Smokeless tobacco: Never  Vaping Use   Vaping status: Never Used  Substance and Sexual Activity   Alcohol use: No    Alcohol/week: 0.0 standard drinks of alcohol   Drug use: No   Sexual activity: Yes    Partners: Male    Birth control/protection: Surgical  Other Topics Concern   Not on file  Social History Narrative   Lives  in [place] with husband, two children gone . Daughter's are grown and out of the house. Her 54 yo is going to college            Social Drivers of Corporate investment banker Strain: Low Risk  (05/10/2023)   Received from [facility]   Overall Financial Resource Strain (CARDIA)    Difficulty of Paying Living Expenses: Not hard at all  Food Insecurity: No Food Insecurity (03/02/2024)   Hunger Vital Sign    Worried About Running Out of Food in the Last Year: Never true    Ran Out of Food in the Last Year: Never true  Transportation Needs: No Transportation Needs (03/02/2024)   PRAPARE - Administrator, Civil Service (Medical): No    Lack of Transportation (Non-Medical): No  Physical Activity: Sufficiently Active (07/07/2018)   Exercise Vital Sign    Days of Exercise per Week: 3 days    Minutes of Exercise per Session: 60 min  Stress: Stress Concern Present (07/07/2018)   [employer] of Occupational Health - Occupational Stress Questionnaire    Feeling of Stress : Very much  Social Connections: Socially Integrated (07/07/2018)   Social Connection and Isolation Panel    Frequency of Communication with Friends and Family: More than three times a week    Frequency of Social Gatherings with Friends and Family: More than three times a week    Attends Religious Services: More than 4 times per year    Active Member of [employer] or Organizations: Yes    Attends Engineer, structural: More than 4 times per year    Marital Status: Married     Family History:  The patient's family history includes Alcohol abuse in her maternal uncle and another family member; Arthritis in an other family member; Diabetes in her maternal aunt and mother; Early death in her mother; Early death (age of onset: 54) in her sister; Gout in her mother; Heart disease in an other family member; Hyperlipidemia in her maternal aunt; Hypertension in her maternal aunt and mother; Kidney  failure in her mother; Other in an other family member; Prostate cancer in her maternal uncle. There is no history of Breast cancer.  ROS:   Ongoing dyspnea on exertion and intermittent chest pain at rest.  She sometimes becomes lightheaded when she is short of breath.  She denies palpitations, PND, orthopnea,  syncope, edema, or early satiety.  EKGs/Other Studies Reviewed:    Studies reviewed were summarized above. The additional studies were reviewed today:  03/02/2024 R/LHC   Prox RCA to Mid RCA lesion is 10% stenosed.   1st Diag lesion is 40% stenosed.   Mid LAD lesion is 30% stenosed.   1.  Mild to moderate nonobstructive coronary artery disease. 2.  Left ventricular angiography was not performed.  EF was normal by noninvasive testing. 3.  Right heart catheterization showed mildly elevated RA pressure at 9, normal pulmonary pressure, high normal wedge pressure and normal cardiac output. 4.  Negative testing for coronary microvascular disease.  RFR across the lesion in the LAD was 0.94 with an FFR of 0.91.  CFR was 3.6 with an IMR of 14. 5.  The patient required large doses of sedation.  EKG:  EKG personally reviewed by me today EKG Interpretation Date/Time:  Thursday March 19 2024 08:54:29 EDT Ventricular Rate:  92 PR Interval:  152 QRS Duration:  96 QT Interval:  368 QTC Calculation: 455 R Axis:   14  Text Interpretation: Normal sinus rhythm Moderate voltage criteria for LVH, may be normal variant ( R in aVL , Cornell product ) Nonspecific T wave abnormality Confirmed by Vivienne Bruckner [phone]) on 03/19/2024 8:57:50 AM  PHYSICAL EXAM:    VS:  BP 100/70 (BP Location: Left Arm, Patient Position: Sitting, Cuff Size: Large)   Pulse 92   Ht 5' 2 (1.575 m)   Wt 218 lb 8 oz (99.1 kg)   SpO2 98%   BMI 39.96 kg/m   BMI: Body mass index is 39.96 kg/m.  GEN: Well nourished, well developed in no acute distress NECK: No JVD; No carotid bruits CARDIAC: RRR, no murmurs, rubs,  gallops RESPIRATORY:  Clear to auscultation without rales, wheezing or rhonchi  ABDOMEN: Soft, non-tender, non-distended EXTREMITIES:  No edema; No deformity.  Right radial and brachial catheterization sites without bleeding, bruit, or hematoma.  Wt Readings from Last 3 Encounters:  03/19/24 218 lb 8 oz (99.1 kg)  03/02/24 215 lb (97.5 kg)  02/19/24 222 lb (100.7 kg)       ASSESSMENT & PLAN:   1.  Nonobstructive coronary artery disease/precordial pain: Patient with a history of intermittent resting exertional precordial chest pain who underwent diagnostic catheterization earlier this month showing mild, nonobstructive CAD with normal perfusion and normal microvascular function.  She has continued to have intermittent predominantly resting chest pain.  We discussed her diagnostic catheterization results in detail today and reassurance provided.  She plans to follow-up with pulmonology.  She remains on aspirin , beta-blocker, ARB, and statin therapy.   2.  Chronic HFpEF/chronic dyspnea on exertion: Recent prior left heart cardiac catheterization with relatively normal right heart filling pressures and mild, nonobstructive CAD.  Discussed in detail today.  Reassurance provided.  She plans to follow-up with pulmonology related to dyspnea on denies that deconditioning is playing a role as well.  Heart rate and blood pressure stable.  She is euvolemic on examination.  Continue current regimen.   3.  Primary hypertension: Well-controlled on beta-blocker, ARB, and diuretic therapy.  4.  Hyperlipidemia: On statin therapy.  Lipids followed by primary care.  Normal AST/ALT earlier this year.   5.  Obstructive sleep apnea: On CPAP and followed by pulmonology.  No significant pulmonary hypertension on recent right heart catheterization.  6.  Morbid obesity: BMI of 39.96.  Patient does not routinely exercise.  In light of reassuring catheterization findings, she will  plan to follow-up with pulmonology  for additional management.  Strongly encouraged regular exercise regimen including 30 minutes of moderate exercise at least 5 days a week.    Disposition: F/u with Dr. Darron or an APP in 6 months or sooner if necessary.   Medication Adjustments/Labs and Tests Ordered: Current medicines are reviewed at length with the patient today.  Concerns regarding medicines are outlined above. Medication changes, Labs and Tests ordered today are summarized above and listed in the Patient Instructions accessible in Encounters.   Signed, Lonni Meager, NP 03/19/2024 9:29 AM     [facility] [address] [place], [place] [address] [phone]

## 2024-03-19 ENCOUNTER — Encounter: Payer: Self-pay | Admitting: Cardiovascular Disease

## 2024-03-19 ENCOUNTER — Ambulatory Visit: Attending: Nurse Practitioner | Admitting: Nurse Practitioner

## 2024-03-19 VITALS — BP 100/70 | HR 92 | Ht 62.0 in | Wt 218.5 lb

## 2024-03-19 DIAGNOSIS — E66812 Obesity, class 2: Secondary | ICD-10-CM

## 2024-03-19 DIAGNOSIS — I5032 Chronic diastolic (congestive) heart failure: Secondary | ICD-10-CM

## 2024-03-19 DIAGNOSIS — I10 Essential (primary) hypertension: Secondary | ICD-10-CM

## 2024-03-19 DIAGNOSIS — I25118 Atherosclerotic heart disease of native coronary artery with other forms of angina pectoris: Secondary | ICD-10-CM | POA: Diagnosis not present

## 2024-03-19 DIAGNOSIS — E782 Mixed hyperlipidemia: Secondary | ICD-10-CM | POA: Diagnosis not present

## 2024-03-19 NOTE — Patient Instructions (Signed)
 Medication Instructions:  Your physician recommends that you continue on your current medications as directed. Please refer to the Current Medication list given to you today.    *If you need a refill on your cardiac medications before your next appointment, please call your pharmacy*  Lab Work: No labs ordered today    Testing/Procedures: No test ordered today   Follow-Up: At [facility], you and your health needs are our priority.  As part of our continuing mission to provide you with exceptional heart care, our providers are all part of one team.  This team includes your primary Cardiologist (physician) and Advanced Practice Providers or APPs (Physician Assistants and Nurse Practitioners) who all work together to provide you with the care you need, when you need it.  Your next appointment:   6 month(s)  Provider:   Deatrice Cage, MD or Lonni Meager, NP

## 2024-04-20 ENCOUNTER — Telehealth: Payer: Self-pay | Admitting: Cardiovascular Disease

## 2024-04-20 NOTE — Telephone Encounter (Signed)
 Spoke with pt, who reported developing sharp chest pain today while at work. She noted it was a sharp pain that lasted for an extended period of time. Pt also reported that during the episode, she was unable to take deep breaths. Pt denies chest pain currently. Pt recently underwent a cath on 03/02/24.  Nurse attempted to schedule pt with DOD tomorrow; however, pt stated she only wanted to see someone familiar with her history. Appointment scheduled for Thursday, 04/23/24, with Medford Meager, NP. Pt made aware of ED precautions should any new symptoms develop or worsen.

## 2024-04-20 NOTE — Telephone Encounter (Signed)
   Pt c/o of Chest Pain: STAT if active CP, including tightness, pressure, jaw pain, radiating pain to shoulder/upper arm/back, CP unrelieved by Nitro. Symptoms reported of SOB, nausea, vomiting, sweating.  1. Are you having CP right now? no    2. Are you experiencing any other symptoms (ex. SOB, nausea, vomiting, sweating)? Sweating, lightheaded   3. Is your CP continuous or coming and going? continuous   4. Have you taken Nitroglycerin ? No    5. How long have you been experiencing CP? today    6. If NO CP at time of call then end call with telling Pt to call back or call 911 if Chest pain returns prior to return call from triage team.

## 2024-04-23 ENCOUNTER — Encounter: Payer: Self-pay | Admitting: Nurse Practitioner

## 2024-04-23 ENCOUNTER — Ambulatory Visit: Attending: Nurse Practitioner | Admitting: Nurse Practitioner

## 2024-04-23 VITALS — BP 119/83 | HR 79 | Ht 62.0 in | Wt 222.1 lb

## 2024-04-23 DIAGNOSIS — E782 Mixed hyperlipidemia: Secondary | ICD-10-CM | POA: Diagnosis not present

## 2024-04-23 DIAGNOSIS — G4733 Obstructive sleep apnea (adult) (pediatric): Secondary | ICD-10-CM

## 2024-04-23 DIAGNOSIS — K219 Gastro-esophageal reflux disease without esophagitis: Secondary | ICD-10-CM

## 2024-04-23 DIAGNOSIS — I5032 Chronic diastolic (congestive) heart failure: Secondary | ICD-10-CM | POA: Diagnosis not present

## 2024-04-23 DIAGNOSIS — I10 Essential (primary) hypertension: Secondary | ICD-10-CM

## 2024-04-23 DIAGNOSIS — I25118 Atherosclerotic heart disease of native coronary artery with other forms of angina pectoris: Secondary | ICD-10-CM

## 2024-04-23 NOTE — Progress Notes (Signed)
 Office Visit    Patient Name: Marie Vaughan Date of Encounter: 04/23/2024  Primary Care Provider:  Cyrus Selinda Moose, PA-C Primary Cardiologist:  Deatrice Cage, MD  Cardiology APP:  Vivienne Lonni Ingle, NP   Chief Complaint    53 y.o. female with a history of hypertension, GERD, sleep apnea, diastolic dysfunction, and obesity who presents for follow-up due to recurrent chest pain.  Past Medical History   Subjective   Past Medical History:  Diagnosis Date   Acne    Acute bronchitis    Allergy    Angio-edema    Anxiety    Anxiety state, unspecified    CAD (non-obstructive)    a. 02/2024 Cath: LM nl, LAD 3m (RFR 0.94, FFR 0.91, CFR 3.6 w/ IMR of 14), D1 40, LCX nl, RCA 10p/m.  No evidence of microvascular dzs. Nl R heart pressures.   Cervicalgia    Chronic dyspnea    a. 04/2021 RHC: RA 12/9, RV 31/7 (13), PA 29/16 (22), PCWP 12, CO 5.16 L/min; CI 2.58 L/min/m; b. 02/2024 R/LHC: RA 9, PA 23/11 (16), PCWP 12. Mild nonobs CAD w/ nl perfusion.   Contact lens/glasses fitting    wears contacts or glasses   Diastolic dysfunction    a. 11/2015 Echo: EF 55-60%, no rwma; b. 01/2021 Echo: EF 55-60%, no rwma, GrI DD, nl RV size/fxn.   Dysmenorrhea    GERD (gastroesophageal reflux disease)    History of stress test    a. Lexiscan 08/25/13: no sig ischemia, attenuation artifact noted, no sig WMA noted, EF 69%, no EKG changes concerning for ischemia   HTN (hypertension)    Iron deficiency anemia secondary to blood loss (chronic)    Migraine with aura, without mention of intractable migraine without mention of status migrainosus    vestibular per pt   Non-obstructive CAD (coronary artery disease)    a. 04/29/2014: LM nl, LAD no dz, D1 no dz, D2 very small vessel, D3 very small vessel, LCx nl, OM1 very small vessel, OM2 medium sized vessel, OM 3 no dz, RCA no dz, PDA no dzs; b. 12/2020 Cor CTA: Cor CA2+ = 21.8 (92nd%'ile), LM nl, LAD 1-24%p, D1 nl, LCX nl, RCA nl. Ao nl size w/o  Ca2+, main PA dilated @ 28mm (? PAH).   Obesity    Other disorders of bone and cartilage(733.99)    Pain in thoracic spine    Pharyngitis    Tear of medial meniscus of left knee    Tear of medial meniscus of right knee    Vaginal delivery    x 3   Wears contact lenses    Past Surgical History:  Procedure Laterality Date   ABDOMINAL HYSTERECTOMY  04-25-10   Due to abnormal PAP   CARDIAC CATHETERIZATION  04/2014   [facility]   COLONOSCOPY WITH PROPOFOL  N/A 07/11/2017   Procedure: COLONOSCOPY WITH PROPOFOL ;  Surgeon: Jinny Carmine, MD;  Location: [facility];  Service: Endoscopy;  Laterality: N/A;   CORONARY PRESSURE/FFR STUDY N/A 03/02/2024   Procedure: CORONARY PRESSURE/FFR STUDY;  Surgeon: Cage Deatrice LABOR, MD;  Location: [facility];  Service: Cardiovascular;  Laterality: N/A;   DILATION AND CURETTAGE OF UTERUS     ESOPHAGOGASTRODUODENOSCOPY (EGD) WITH PROPOFOL  N/A 07/11/2017   Procedure: ESOPHAGOGASTRODUODENOSCOPY (EGD) WITH PROPOFOL ;  Surgeon: Jinny Carmine, MD;  Location: [facility];  Service: Endoscopy;  Laterality: N/A;  diabetic - diet controlled   GIVENS CAPSULE STUDY N/A 09/23/2017   Procedure: GIVENS CAPSULE  STUDY;  Surgeon: Janalyn Keene NOVAK, MD;  Location: [facility];  Service: Endoscopy;  Laterality: N/A;   KNEE ARTHROSCOPY Left 06/10/2013   Procedure: LEFT KNEE ARTHROSCOPY KNEE WITH PARTIAL MEDIAL MENISCECTOMY ;  Surgeon: Lamar DELENA Millman, MD;  Location: [facility];  Service: Orthopedics;  Laterality: Left;  xerofoam, 4x4's, webril, ace wrap, ice wrap   KNEE ARTHROSCOPY WITH MEDIAL MENISECTOMY Right 06/10/2013   Procedure: RIGHT KNEE ARTHROSCOPY WITH MEDIAL MENISECTOMY;  Surgeon: Lamar DELENA Millman, MD;  Location: [facility];  Service: Orthopedics;  Laterality: Right;  partial lateral menisectomoy and chondroplasty   OVARIAN CYST REMOVAL  04-25-10   RIGHT HEART CATH Right 04/28/2021   Procedure: RIGHT HEART CATH;  Surgeon:  Darron Deatrice DELENA, MD;  Location: [facility];  Service: Cardiovascular;  Laterality: Right;   RIGHT/LEFT HEART CATH AND CORONARY ANGIOGRAPHY Bilateral 03/02/2024   Procedure: RIGHT/LEFT HEART CATH AND CORONARY ANGIOGRAPHY;  Surgeon: Darron Deatrice DELENA, MD;  Location: [facility];  Service: Cardiovascular;  Laterality: Bilateral;    Allergies  Allergies  Allergen Reactions   Iodinated Contrast Media Anxiety    Other Reaction(s): itching and couldn't breathe 3/09 at [facility] with CT   Chocolate Other (See Comments)    Hives, itching CoCo   Chocolate Flavoring Agent (Non-Screening) Other (See Comments)    Hives, itching   Flavoring Agent Other (See Comments)   Peanut-Containing Drug Products Swelling    cashews   Pineapple Itching and Swelling       History of Present Illness      53 y.o. y/o female with above past medical history including chest pain, hypertension, GERD, obesity, anemia, sleep apnea, chronic pain, and migraines.  She was previously evaluated in 2015 in the setting of chest pain and underwent stress testing, which was low risk. Diagnostic catheterization was eventually performed in the setting of ongoing symptoms in October 2015, showing normal coronary arteries and normal LV function. Echocardiogram in 2017 again showed normal LV function without any significant valve disease. In June 2022, she again reported dyspnea with minimal activity and intermittent rest and exertional chest pain. Coronary CTA was performed showing a coronary calcium  score of 21.8 (92nd percentile), and minimal (1 to 24%) LAD disease. The main pulmonary artery was dilated to 28 mm. Aggressive risk factor modification was recommended. A follow-up visit in July 2022, she continued to report significant exertional dyspnea and intermittent chest pain. She was scheduled for right and left heart cardiac catheterization but initially canceled. At follow-up visit in September 2022, she again  reported dyspnea on exertion and intermittent pleuritic type chest discomfort. Right heart catheterization was performed on October 7, showing high normal filling pressures with minimal pulmonary hypertension and normal cardiac output.   In April 2025, she reported ongoing intermittent resting exertional chest discomfort and dyspnea.  We agreed to trial of long-acting nitrate therapy which resulted in low blood pressures and fatigue and was subsequently discontinued.  She reported ongoing chest pain and dyspnea at follow-up visit in July 2025, decision was made to proceed with diagnostic catheterization, which again showed mild to moderate nonobstructive CAD with normal LV function, normal coronary microvascular testing, and high normal pulmonary capillary wedge pressure.  Post catheterization, she complained of chest pain and was observed overnight and subsequently discharged the following day.      Ms. Brundage was last seen in cardiology clinic on March 19, 2024, at which time she complained of intermittent predominantly resting chest pain consistent  with prior symptoms.  Over the past month, she has continued to experience intermittent sharp  abdominal, epigastric, and precordial pain occurring a few times a week, sometimes associated with dyspnea, often worse with deep breathing, lasting about 30 minutes, resolving spontaneously.  She recently had a symptom at work which prompted her to establish follow-up today.  She denies exertional chest pain.  Chronic dyspnea has actually improved some since her last visit as she knows better activity tolerance walking to her from her car.  She has changed her diet, cutting out red meat and fried foods and has been generally feeling better from that standpoint.  She denies palpitations, PND, orthopnea, dizziness, syncope, edema, or early satiety. Objective   Home Medications    Current Outpatient Medications  Medication Sig Dispense Refill   acetaminophen   (TYLENOL ) 500 MG tablet Take 500-1,000 mg by mouth every 6 (six) hours as needed (pain).     albuterol  (VENTOLIN  HFA) 108 (90 Base) MCG/ACT inhaler Inhale 1-2 puffs into the lungs every 4 (four) hours as needed.     aspirin  EC 81 MG tablet Take 1 tablet (81 mg total) by mouth daily. 90 tablet 3   carvedilol  (COREG ) 6.25 MG tablet Take 1 tablet (6.25 mg total) by mouth 2 (two) times daily with a meal. 60 tablet 3   diclofenac  sodium (VOLTAREN ) 1 % GEL Apply 4 g topically 4 (four) times daily. 300 g 0   hyoscyamine (ANASPAZ) 0.125 MG TBDP disintergrating tablet Place 0.125 mg under the tongue daily as needed for cramping (Gi).     omeprazole  (PRILOSEC) 40 MG capsule Take 40 mg by mouth daily as needed (Acid reflux).     potassium chloride  SA (KLOR-CON  M) 20 MEQ tablet Take 1 tablet (20 mEq total) by mouth daily. 90 tablet 0   rosuvastatin  (CRESTOR ) 5 MG tablet Take 1 tablet (5 mg total) by mouth daily. 90 tablet 3   simethicone  (MYLICON) 125 MG chewable tablet Chew 125 mg by mouth every 6 (six) hours as needed for flatulence.     Ubrogepant  (UBRELVY ) 50 MG TABS Take 1-2 tablets by mouth daily as needed. 10 tablet 2   valsartan -hydrochlorothiazide  (DIOVAN -HCT) 160-25 MG tablet TAKE 1 TABLET BY MOUTH DAILY 90 tablet 2   No current facility-administered medications for this visit.     Physical Exam    VS:  BP 119/83 (BP Location: Left Arm, Patient Position: Sitting, Cuff Size: Large)   Pulse 79   Ht 5' 2 (1.575 m)   Wt 222 lb 2 oz (100.8 kg)   SpO2 98%   BMI 40.63 kg/m  , BMI Body mass index is 40.63 kg/m.          GEN: Obese, in no acute distress. HEENT: normal. Neck: Supple, no JVD, carotid bruits, or masses. Cardiac: RRR, no murmurs, rubs, or gallops. No clubbing, cyanosis, edema.  Radials 2+/PT 2+ and equal bilaterally.  Respiratory:  Respirations regular and unlabored, clear to auscultation bilaterally. GI: Soft, nontender, nondistended, BS + x 4. MS: no deformity or  atrophy. Skin: warm and dry, no rash. Neuro:  Strength and sensation are intact. Psych: Normal affect.  Accessory Clinical Findings    ECG personally reviewed by me today - EKG Interpretation Date/Time:  Thursday April 23 2024 14:38:49 EDT Ventricular Rate:  79 PR Interval:  178 QRS Duration:  98 QT Interval:  406 QTC Calculation: 465 R Axis:   13  Text Interpretation: Normal sinus rhythm Minimal voltage criteria for LVH, may be  normal variant ( R in aVL ) Nonspecific T wave abnormality Prolonged QT Confirmed by Vivienne Bruckner [phone]) on 04/23/2024 2:43:11 PM  - no acute changes.  Lab Results  Component Value Date   WBC 11.9 (H) 03/02/2024   HGB 12.6 03/02/2024   HCT 38.4 03/02/2024   MCV 84.2 03/02/2024   PLT 336 03/02/2024   Lab Results  Component Value Date   CREATININE 0.86 03/02/2024   BUN 10 02/27/2024   NA 140 03/02/2024   K 3.7 03/02/2024   CL 104 02/27/2024   CO2 24 02/27/2024   Lab Results  Component Value Date   ALT 22 10/29/2019   AST 20 10/29/2019   ALKPHOS 82 10/29/2019   BILITOT 0.5 10/29/2019   Lab Results  Component Value Date   CHOL 162 10/29/2019   HDL 48 10/29/2019   LDLCALC 76 10/29/2019   TRIG 191 (H) 10/29/2019   CHOLHDL 3.4 10/29/2019    Lab Results  Component Value Date   HGBA1C 6.2 (H) 10/29/2019   Lab Results  Component Value Date   TSH 0.755 12/23/2020       Assessment & Plan    1.  Precordial pain/nonobstructive CAD: Status post recent diagnostic catheterization revealing mild, nonobstructive CAD with normal perfusion and normal microvascular function.  Despite this, she is continue to have intermittent sharp and shooting chest pain, predominantly at rest, lasting about 30 minutes, resolving spontaneously.  Symptoms are occurring about twice a week.  She recently had episode of abdominal and epigastric discomfort that moved into her chest before resolving within about 30 minutes.  We discussed her catheterization and  other cardiac testing results in detail again today.  Symptoms do not appear to be cardiac in origin.  She does have a prescription for Prilosec which she does not use.  Strongly encouraged to take Prilosec 20 mg daily as prescribed and advised that she follow-up with GI.  2.  Chronic HFpEF: Normal EF by echo in July 2022.  Mildly elevated wedge pressure at the time of right heart catheterization August 2025.  Breathing is actually improved and she is euvolemic on examination today.  She has made some dietary changes.  Heart rate and blood pressure stable.  Would benefit from GLP-1 agonist to assist with weight loss in the future.  Defer to primary care.  3.  Primary hypertension: Stable on carvedilol  and Diovan  HCTZ.  4.  Hyperlipidemia: On statin therapy.  We do not have any lipids on file since 2022.  Goal LDL of less than 70.  5.  Obstructive sleep apnea: On CPAP and followed by pulmonology.  6.  GERD: Has Prilosec 20 mg to be used daily but only uses as needed and even in that setting, uses rarely.  Strongly encouraged her to use daily as outlined above.  7.  Disposition: Follow-up in 4 to 6 months as planned.  Strongly encouraged her to follow-up with gastroenterology.  Bruckner Vivienne, NP 04/23/2024, 2:45 PM

## 2024-04-23 NOTE — Patient Instructions (Addendum)
 Medication Instructions:   Your physician recommends that you continue on your current medications as directed. Please refer to the Current Medication list given to you today.    *If you need a refill on your cardiac medications before your next appointment, please call your pharmacy*  Lab Work:  None ordered at this time   If you have labs (blood work) drawn today and your tests are completely normal, you will receive your results only by:  MyChart Message (if you have MyChart) OR  A paper copy in the mail If you have any lab test that is abnormal or we need to change your treatment, we will call you to review the results.  Testing/Procedures:  None ordered at this time   Referrals:  None ordered at this time   Follow-Up:  At [facility], you and your health needs are our priority.  As part of our continuing mission to provide you with exceptional heart care, our providers are all part of one team.  This team includes your primary Cardiologist (physician) and Advanced Practice Providers or APPs (Physician Assistants and Nurse Practitioners) who all work together to provide you with the care you need, when you need it.  Your next appointment:   5 month(s)  Provider:    Deatrice Cage, MD    We recommend signing up for the patient portal called MyChart.  Sign up information is provided on this After Visit Summary.  MyChart is used to connect with patients for Virtual Visits (Telemedicine).  Patients are able to view lab/test results, encounter notes, upcoming appointments, etc.  Non-urgent messages can be sent to your provider as well.   To learn more about what you can do with MyChart, go to [email or web address].   Other Instructions None ordered at this time

## 2024-07-03 ENCOUNTER — Telehealth: Payer: Self-pay | Admitting: Cardiovascular Disease

## 2024-07-03 NOTE — Telephone Encounter (Signed)
 Called patient to review symptoms - advised that she should be checked out in the ER due to CP, SOB, elevated HR and dizziness,  Patient verbalized understanding and agreement with plan  Will follow through her chart

## 2024-07-03 NOTE — Telephone Encounter (Signed)
 Patient c/o Palpitations:  STAT if patient reporting lightheadedness, shortness of breath, or chest pain  How long have you had palpitations/irregular HR/ Afib? Are you having the symptoms now? No   Are you currently experiencing lightheadedness, SOB or CP? SOB, CP   Do you have a history of afib (atrial fibrillation) or irregular heart rhythm? Unknown   Have you checked your BP or HR? (document readings if available): 159 HR   Are you experiencing any other symptoms? Dizziness     Please Advise

## 2024-07-09 NOTE — Telephone Encounter (Signed)
 Called the patient back to follow up on her chest pain. She stated that she did not go to the ED and that it has gotten better. Follow up appointment has been made for 07/28/24 with Dr. Darron. She has been advised to go to the ED for worsening symptoms or call 911.

## 2024-07-09 NOTE — Telephone Encounter (Signed)
 ERROR

## 2024-07-28 ENCOUNTER — Ambulatory Visit: Admitting: Cardiovascular Disease

## 2024-07-28 ENCOUNTER — Encounter: Payer: Self-pay | Admitting: Cardiovascular Disease

## 2024-07-28 VITALS — BP 140/80 | HR 90 | Ht 62.0 in | Wt 211.0 lb

## 2024-07-28 DIAGNOSIS — G4733 Obstructive sleep apnea (adult) (pediatric): Secondary | ICD-10-CM

## 2024-07-28 DIAGNOSIS — I10 Essential (primary) hypertension: Secondary | ICD-10-CM | POA: Diagnosis not present

## 2024-07-28 DIAGNOSIS — I5032 Chronic diastolic (congestive) heart failure: Secondary | ICD-10-CM | POA: Diagnosis not present

## 2024-07-28 DIAGNOSIS — R002 Palpitations: Secondary | ICD-10-CM | POA: Diagnosis not present

## 2024-07-28 DIAGNOSIS — I2510 Atherosclerotic heart disease of native coronary artery without angina pectoris: Secondary | ICD-10-CM | POA: Diagnosis not present

## 2024-07-28 MED ORDER — CARVEDILOL 6.25 MG PO TABS: 6.2500 mg | ORAL_TABLET | Freq: Two times a day (BID) | ORAL | 3 refills | Status: AC

## 2024-07-28 NOTE — Progress Notes (Signed)
 "    Cardiology Office Note   Date:  07/28/2024   ID:  Marie Vaughan, DOB 1971/02/23, MRN [id number]  PCP:  Cyrus Selinda Moose, PA-C  Cardiologist:   Deatrice Cage, MD   Chief Complaint  Patient presents with   Chest Pain    Pt has *FLU*-diagnosed last Friday.* continue with chest pain and heart rate up.      History of Present Illness: Marie Vaughan is a 54 y.o. female who presents for a follow-up visit regarding chest pain and shortness of breath. She has history of normal coronary arteries on cardiac catheterization 2015, essential hypertension, GERD, obstructive sleep apnea not on CPAP and obesity.  She had recurrent chest pain and underwent cardiac CTA in 2022 which showed mild nonobstructive coronary artery disease with calcium  score of 22.  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  Echocardiogram was done which showed an EF of 55 to 60% with grade 1 diastolic dysfunction and no significant valvular abnormalities.  Pulmonary pressure could not be estimated.  Right heart catheterization in 2022 showed high normal filling pressures with minimal pulmonary hypertension.  Due to recurrent exertional chest pain, she underwent a right and left cardiac catheterization in August 2025.  Right heart catheterization showed high normal filling pressures with no evidence of pulmonary hypertension.  Coronary angiography showed mild to moderate nonobstructive coronary artery disease with normal microvascular testing. She can her office in December due to intermittent tachycardia and a heart rate above 100 with associated dizziness.  Her heart rate continued to be above 100 for about 1 day and then went back to the 70s.  She does admit that she stopped taking carvedilol  few months ago.  Past Medical History:  Diagnosis Date   Acne    Acute bronchitis    Allergy    Angio-edema    Anxiety    Anxiety state, unspecified    CAD (non-obstructive)    a. 02/2024 Cath: LM nl, LAD 49m (RFR  0.94, FFR 0.91, CFR 3.6 w/ IMR of 14), D1 40, LCX nl, RCA 10p/m.  No evidence of microvascular dzs. Nl R heart pressures.   Cervicalgia    Chronic dyspnea    a. 04/2021 RHC: RA 12/9, RV 31/7 (13), PA 29/16 (22), PCWP 12, CO 5.16 L/min; CI 2.58 L/min/m; b. 02/2024 R/LHC: RA 9, PA 23/11 (16), PCWP 12. Mild nonobs CAD w/ nl perfusion.   Contact lens/glasses fitting    wears contacts or glasses   Diastolic dysfunction    a. 11/2015 Echo: EF 55-60%, no rwma; b. 01/2021 Echo: EF 55-60%, no rwma, GrI DD, nl RV size/fxn.   Dysmenorrhea    GERD (gastroesophageal reflux disease)    History of stress test    a. Lexiscan 08/25/13: no sig ischemia, attenuation artifact noted, no sig WMA noted, EF 69%, no EKG changes concerning for ischemia   HTN (hypertension)    Iron deficiency anemia secondary to blood loss (chronic)    Migraine with aura, without mention of intractable migraine without mention of status migrainosus    vestibular per pt   Non-obstructive CAD (coronary artery disease)    a. 04/29/2014: LM nl, LAD no dz, D1 no dz, D2 very small vessel, D3 very small vessel, LCx nl, OM1 very small vessel, OM2 medium sized vessel, OM 3 no dz, RCA no dz, PDA no dzs; b. 12/2020 Cor CTA: Cor CA2+ = 21.8 (92nd%'ile), LM nl, LAD 1-24%p, D1 nl, LCX nl, RCA nl. Ao nl  size w/o Ca2+, main PA dilated @ 28mm (? PAH).   Obesity    Other disorders of bone and cartilage(733.99)    Pain in thoracic spine    Pharyngitis    Tear of medial meniscus of left knee    Tear of medial meniscus of right knee    Vaginal delivery    x 3   Wears contact lenses     Past Surgical History:  Procedure Laterality Date   ABDOMINAL HYSTERECTOMY  04-25-10   Due to abnormal PAP   CARDIAC CATHETERIZATION  04/2014   [facility]   COLONOSCOPY WITH PROPOFOL  N/A 07/11/2017   Procedure: COLONOSCOPY WITH PROPOFOL ;  Surgeon: Jinny Carmine, MD;  Location: [facility];  Service: Endoscopy;  Laterality: N/A;   CORONARY PRESSURE/FFR STUDY N/A  03/02/2024   Procedure: CORONARY PRESSURE/FFR STUDY;  Surgeon: Darron Deatrice LABOR, MD;  Location: [facility];  Service: Cardiovascular;  Laterality: N/A;   DILATION AND CURETTAGE OF UTERUS     ESOPHAGOGASTRODUODENOSCOPY (EGD) WITH PROPOFOL  N/A 07/11/2017   Procedure: ESOPHAGOGASTRODUODENOSCOPY (EGD) WITH PROPOFOL ;  Surgeon: Jinny Carmine, MD;  Location: [facility];  Service: Endoscopy;  Laterality: N/A;  diabetic - diet controlled   GIVENS CAPSULE STUDY N/A 09/23/2017   Procedure: GIVENS CAPSULE STUDY;  Surgeon: Janalyn Keene NOVAK, MD;  Location: [facility];  Service: Endoscopy;  Laterality: N/A;   KNEE ARTHROSCOPY Left 06/10/2013   Procedure: LEFT KNEE ARTHROSCOPY KNEE WITH PARTIAL MEDIAL MENISCECTOMY ;  Surgeon: Lamar LABOR Millman, MD;  Location: [facility];  Service: Orthopedics;  Laterality: Left;  xerofoam, 4x4's, webril, ace wrap, ice wrap   KNEE ARTHROSCOPY WITH MEDIAL MENISECTOMY Right 06/10/2013   Procedure: RIGHT KNEE ARTHROSCOPY WITH MEDIAL MENISECTOMY;  Surgeon: Lamar LABOR Millman, MD;  Location: [facility];  Service: Orthopedics;  Laterality: Right;  partial lateral menisectomoy and chondroplasty   OVARIAN CYST REMOVAL  04-25-10   RIGHT HEART CATH Right 04/28/2021   Procedure: RIGHT HEART CATH;  Surgeon: Darron Deatrice LABOR, MD;  Location: [facility];  Service: Cardiovascular;  Laterality: Right;   RIGHT/LEFT HEART CATH AND CORONARY ANGIOGRAPHY Bilateral 03/02/2024   Procedure: RIGHT/LEFT HEART CATH AND CORONARY ANGIOGRAPHY;  Surgeon: Darron Deatrice LABOR, MD;  Location: [facility];  Service: Cardiovascular;  Laterality: Bilateral;     Current Outpatient Medications  Medication Sig Dispense Refill   acetaminophen  (TYLENOL ) 500 MG tablet Take 500-1,000 mg by mouth every 6 (six) hours as needed (pain).     albuterol  (VENTOLIN  HFA) 108 (90 Base) MCG/ACT inhaler Inhale 1-2 puffs into the lungs every 4 (four) hours as needed.      aspirin  EC 81 MG tablet Take 1 tablet (81 mg total) by mouth daily. 90 tablet 3   carvedilol  (COREG ) 6.25 MG tablet Take 1 tablet (6.25 mg total) by mouth 2 (two) times daily with a meal. 60 tablet 3   diclofenac  sodium (VOLTAREN ) 1 % GEL Apply 4 g topically 4 (four) times daily. 300 g 0   hyoscyamine (ANASPAZ) 0.125 MG TBDP disintergrating tablet Place 0.125 mg under the tongue daily as needed for cramping (Gi).     omeprazole  (PRILOSEC) 40 MG capsule Take 40 mg by mouth daily.     potassium chloride  SA (KLOR-CON  M) 20 MEQ tablet Take 1 tablet (20 mEq total) by mouth daily. 90 tablet 0   rosuvastatin  (CRESTOR ) 5 MG tablet Take 1 tablet (5 mg total) by mouth daily. (Patient not taking: Reported on 07/28/2024) 90  tablet 3   simethicone  (MYLICON) 125 MG chewable tablet Chew 125 mg by mouth every 6 (six) hours as needed for flatulence.     Ubrogepant  (UBRELVY ) 50 MG TABS Take 1-2 tablets by mouth daily as needed. 10 tablet 2   valsartan -hydrochlorothiazide  (DIOVAN -HCT) 160-25 MG tablet TAKE 1 TABLET BY MOUTH DAILY 90 tablet 2   No current facility-administered medications for this visit.    Allergies:   Iodinated contrast media, Chocolate, Chocolate flavoring agent (non-screening), Flavoring agent, Peanut-containing drug products, and Pineapple    Social History:  The patient  reports that she has never smoked. She has never used smokeless tobacco. She reports that she does not drink alcohol and does not use drugs.   Family History:  The patient's family history includes Alcohol abuse in her maternal uncle and another family member; Arthritis in an other family member; Diabetes in her maternal aunt and mother; Early death in her mother; Early death (age of onset: 73) in her sister; Gout in her mother; Heart disease in an other family member; Hyperlipidemia in her maternal aunt; Hypertension in her maternal aunt and mother; Kidney failure in her mother; Other in an other family member; Prostate cancer  in her maternal uncle.    ROS:  Please see the history of present illness.   Otherwise, review of systems are positive for none.   All other systems are reviewed and negative.    PHYSICAL EXAM: VS:  BP (!) 140/80 (BP Location: Right Arm, Patient Position: Sitting, Cuff Size: Large)   Pulse 90 Comment: 95 oximeter  Ht 5' 2 (1.575 m)   Wt 211 lb (95.7 kg)   SpO2 99%   BMI 38.59 kg/m  , BMI Body mass index is 38.59 kg/m. GEN: Well nourished, well developed, in no acute distress  HEENT: normal  Neck: no JVD, carotid bruits, or masses Cardiac: RRR; no murmurs, rubs, or gallops,no edema  Respiratory:  clear to auscultation bilaterally, normal work of breathing GI: soft, nontender, nondistended, + BS MS: no deformity or atrophy  Skin: warm and dry, no rash Neuro:  Strength and sensation are intact Psych: euthymic mood, full affect   EKG:  EKG is ordered today. Normal sinus rhythm Nonspecific T wave abnormality When compared with ECG of 23-Apr-2024 14:38, T wave inversion now evident in Inferior leads   Recent Labs: 02/27/2024: BUN 10 03/02/2024: Creatinine, Ser 0.86; Hemoglobin 12.6; Platelets 336; Potassium 3.7; Sodium 140    Lipid Panel    Component Value Date/Time   CHOL 162 10/29/2019 1351   TRIG 191 (H) 10/29/2019 1351   HDL 48 10/29/2019 1351   CHOLHDL 3.4 10/29/2019 1351   VLDL 38 10/29/2019 1351   LDLCALC 76 10/29/2019 1351      Wt Readings from Last 3 Encounters:  07/28/24 211 lb (95.7 kg)  04/23/24 222 lb 2 oz (100.8 kg)  03/19/24 218 lb 8 oz (99.1 kg)           No data to display            ASSESSMENT AND PLAN:  1.  Coronary artery disease involving native coronary arteries without angina: Cardiac catheterization last year showed mild to moderate nonobstructive coronary artery disease with negative microvascular testing.  Continue medical therapy.  2.  Palpitations and tachycardia: She stopped taking carvedilol  on her own.  I resumed the  medication.  If she has recurrent symptoms we will plan on obtaining an outpatient monitor.  3.  Essential hypertension: Blood pressure is  mildly elevated.  Carvedilol  was resumed as outlined above.   4.  Chronic diastolic heart failure: She appears to be euvolemic.  5.  Obesity: Consider GLP-1 medications.    Disposition:   Follow-up in 6 months.  Signed,  Deatrice Cage, MD  07/28/2024 4:56 PM    [facility]  "

## 2024-07-28 NOTE — Patient Instructions (Signed)
 Medication Instructions:  RESUME the Carvedilol  6.25 mg twice daily  *If you need a refill on your cardiac medications before your next appointment, please call your pharmacy*  Lab Work: None ordered If you have labs (blood work) drawn today and your tests are completely normal, you will receive your results only by: MyChart Message (if you have MyChart) OR A paper copy in the mail If you have any lab test that is abnormal or we need to change your treatment, we will call you to review the results.  Testing/Procedures: None ordered  Follow-Up: At [facility], you and your health needs are our priority.  As part of our continuing mission to provide you with exceptional heart care, our providers are all part of one team.  This team includes your primary Cardiologist (physician) and Advanced Practice Providers or APPs (Physician Assistants and Nurse Practitioners) who all work together to provide you with the care you need, when you need it.  Your next appointment:   6 month(s)  Provider:   You may see Deatrice Cage, MD or one of the following Advanced Practice Providers on your designated Care Team:   Lonni Meager, NP Lesley Maffucci, PA-C Bernardino Bring, PA-C Cadence Bayside, PA-C Tylene Lunch, NP Barnie Hila, NP    We recommend signing up for the patient portal called MyChart.  Sign up information is provided on this After Visit Summary.  MyChart is used to connect with patients for Virtual Visits (Telemedicine).  Patients are able to view lab/test results, encounter notes, upcoming appointments, etc.  Non-urgent messages can be sent to your provider as well.   To learn more about what you can do with MyChart, go to [email or web address].

## 2024-09-22 ENCOUNTER — Ambulatory Visit: Admitting: Cardiovascular Disease
